# Patient Record
Sex: Male | Born: 1945 | Race: White | Hispanic: No | Marital: Married | Smoking: Never smoker
Health system: Southern US, Community
[De-identification: ages and names within clinical notes are randomized; demographics above are authoritative.]

## PROBLEM LIST (undated history)

## (undated) DIAGNOSIS — R319 Hematuria, unspecified: Secondary | ICD-10-CM

## (undated) DIAGNOSIS — E782 Mixed hyperlipidemia: Secondary | ICD-10-CM

## (undated) DIAGNOSIS — D649 Anemia, unspecified: Secondary | ICD-10-CM

## (undated) DIAGNOSIS — G8929 Other chronic pain: Secondary | ICD-10-CM

## (undated) DIAGNOSIS — Z9001 Acquired absence of eye: Secondary | ICD-10-CM

## (undated) DIAGNOSIS — M549 Dorsalgia, unspecified: Secondary | ICD-10-CM

## (undated) DIAGNOSIS — K219 Gastro-esophageal reflux disease without esophagitis: Secondary | ICD-10-CM

## (undated) DIAGNOSIS — N2889 Other specified disorders of kidney and ureter: Secondary | ICD-10-CM

## (undated) DIAGNOSIS — R399 Unspecified symptoms and signs involving the genitourinary system: Secondary | ICD-10-CM

## (undated) DIAGNOSIS — C801 Malignant (primary) neoplasm, unspecified: Secondary | ICD-10-CM

## (undated) DIAGNOSIS — E119 Type 2 diabetes mellitus without complications: Secondary | ICD-10-CM

## (undated) DIAGNOSIS — Z862 Personal history of diseases of the blood and blood-forming organs and certain disorders involving the immune mechanism: Secondary | ICD-10-CM

## (undated) DIAGNOSIS — I10 Essential (primary) hypertension: Secondary | ICD-10-CM

## (undated) DIAGNOSIS — Z973 Presence of spectacles and contact lenses: Secondary | ICD-10-CM

## (undated) HISTORY — PX: OTHER SURGICAL HISTORY: SHX169

## (undated) HISTORY — PX: WISDOM TOOTH EXTRACTION: SHX21

---

## 1979-09-16 HISTORY — PX: ENUCLEATION: SHX628

## 2011-11-12 DIAGNOSIS — E119 Type 2 diabetes mellitus without complications: Secondary | ICD-10-CM | POA: Diagnosis not present

## 2011-11-12 DIAGNOSIS — I10 Essential (primary) hypertension: Secondary | ICD-10-CM | POA: Diagnosis not present

## 2011-11-12 DIAGNOSIS — E782 Mixed hyperlipidemia: Secondary | ICD-10-CM | POA: Diagnosis not present

## 2011-11-19 DIAGNOSIS — N400 Enlarged prostate without lower urinary tract symptoms: Secondary | ICD-10-CM | POA: Diagnosis not present

## 2011-11-19 DIAGNOSIS — J309 Allergic rhinitis, unspecified: Secondary | ICD-10-CM | POA: Diagnosis not present

## 2011-11-19 DIAGNOSIS — E782 Mixed hyperlipidemia: Secondary | ICD-10-CM | POA: Diagnosis not present

## 2011-11-19 DIAGNOSIS — L2089 Other atopic dermatitis: Secondary | ICD-10-CM | POA: Diagnosis not present

## 2011-11-19 DIAGNOSIS — I10 Essential (primary) hypertension: Secondary | ICD-10-CM | POA: Diagnosis not present

## 2011-11-19 DIAGNOSIS — F329 Major depressive disorder, single episode, unspecified: Secondary | ICD-10-CM | POA: Diagnosis not present

## 2011-11-19 DIAGNOSIS — K219 Gastro-esophageal reflux disease without esophagitis: Secondary | ICD-10-CM | POA: Diagnosis not present

## 2012-01-05 DIAGNOSIS — K5730 Diverticulosis of large intestine without perforation or abscess without bleeding: Secondary | ICD-10-CM | POA: Diagnosis not present

## 2012-01-05 DIAGNOSIS — E782 Mixed hyperlipidemia: Secondary | ICD-10-CM | POA: Diagnosis not present

## 2012-01-05 DIAGNOSIS — K219 Gastro-esophageal reflux disease without esophagitis: Secondary | ICD-10-CM | POA: Diagnosis not present

## 2012-01-05 DIAGNOSIS — E119 Type 2 diabetes mellitus without complications: Secondary | ICD-10-CM | POA: Diagnosis not present

## 2012-01-05 DIAGNOSIS — Q438 Other specified congenital malformations of intestine: Secondary | ICD-10-CM | POA: Diagnosis not present

## 2012-01-05 DIAGNOSIS — D126 Benign neoplasm of colon, unspecified: Secondary | ICD-10-CM | POA: Diagnosis not present

## 2012-01-05 DIAGNOSIS — Z79899 Other long term (current) drug therapy: Secondary | ICD-10-CM | POA: Diagnosis not present

## 2012-01-05 DIAGNOSIS — Z1211 Encounter for screening for malignant neoplasm of colon: Secondary | ICD-10-CM | POA: Diagnosis not present

## 2012-01-05 DIAGNOSIS — I10 Essential (primary) hypertension: Secondary | ICD-10-CM | POA: Diagnosis not present

## 2012-01-05 DIAGNOSIS — F329 Major depressive disorder, single episode, unspecified: Secondary | ICD-10-CM | POA: Diagnosis not present

## 2012-01-20 DIAGNOSIS — D126 Benign neoplasm of colon, unspecified: Secondary | ICD-10-CM | POA: Diagnosis not present

## 2012-06-01 DIAGNOSIS — N400 Enlarged prostate without lower urinary tract symptoms: Secondary | ICD-10-CM | POA: Diagnosis not present

## 2012-06-01 DIAGNOSIS — R7309 Other abnormal glucose: Secondary | ICD-10-CM | POA: Diagnosis not present

## 2012-06-01 DIAGNOSIS — K219 Gastro-esophageal reflux disease without esophagitis: Secondary | ICD-10-CM | POA: Diagnosis not present

## 2012-06-01 DIAGNOSIS — E782 Mixed hyperlipidemia: Secondary | ICD-10-CM | POA: Diagnosis not present

## 2012-06-01 DIAGNOSIS — I10 Essential (primary) hypertension: Secondary | ICD-10-CM | POA: Diagnosis not present

## 2012-06-08 DIAGNOSIS — E119 Type 2 diabetes mellitus without complications: Secondary | ICD-10-CM | POA: Diagnosis not present

## 2012-06-08 DIAGNOSIS — E782 Mixed hyperlipidemia: Secondary | ICD-10-CM | POA: Diagnosis not present

## 2012-06-08 DIAGNOSIS — I10 Essential (primary) hypertension: Secondary | ICD-10-CM | POA: Diagnosis not present

## 2012-06-08 DIAGNOSIS — K219 Gastro-esophageal reflux disease without esophagitis: Secondary | ICD-10-CM | POA: Diagnosis not present

## 2012-06-08 DIAGNOSIS — J309 Allergic rhinitis, unspecified: Secondary | ICD-10-CM | POA: Diagnosis not present

## 2012-06-08 DIAGNOSIS — Z0000 Encounter for general adult medical examination without abnormal findings: Secondary | ICD-10-CM | POA: Diagnosis not present

## 2012-06-08 DIAGNOSIS — Z23 Encounter for immunization: Secondary | ICD-10-CM | POA: Diagnosis not present

## 2012-10-07 DIAGNOSIS — J209 Acute bronchitis, unspecified: Secondary | ICD-10-CM | POA: Diagnosis not present

## 2012-10-07 DIAGNOSIS — J019 Acute sinusitis, unspecified: Secondary | ICD-10-CM | POA: Diagnosis not present

## 2012-11-29 DIAGNOSIS — E782 Mixed hyperlipidemia: Secondary | ICD-10-CM | POA: Diagnosis not present

## 2012-12-06 DIAGNOSIS — K219 Gastro-esophageal reflux disease without esophagitis: Secondary | ICD-10-CM | POA: Diagnosis not present

## 2012-12-06 DIAGNOSIS — F329 Major depressive disorder, single episode, unspecified: Secondary | ICD-10-CM | POA: Diagnosis not present

## 2012-12-06 DIAGNOSIS — E782 Mixed hyperlipidemia: Secondary | ICD-10-CM | POA: Diagnosis not present

## 2012-12-06 DIAGNOSIS — J309 Allergic rhinitis, unspecified: Secondary | ICD-10-CM | POA: Diagnosis not present

## 2012-12-06 DIAGNOSIS — I10 Essential (primary) hypertension: Secondary | ICD-10-CM | POA: Diagnosis not present

## 2012-12-06 DIAGNOSIS — E119 Type 2 diabetes mellitus without complications: Secondary | ICD-10-CM | POA: Diagnosis not present

## 2013-06-06 DIAGNOSIS — K219 Gastro-esophageal reflux disease without esophagitis: Secondary | ICD-10-CM | POA: Diagnosis not present

## 2013-06-06 DIAGNOSIS — I10 Essential (primary) hypertension: Secondary | ICD-10-CM | POA: Diagnosis not present

## 2013-06-06 DIAGNOSIS — E119 Type 2 diabetes mellitus without complications: Secondary | ICD-10-CM | POA: Diagnosis not present

## 2013-06-06 DIAGNOSIS — E782 Mixed hyperlipidemia: Secondary | ICD-10-CM | POA: Diagnosis not present

## 2013-06-13 DIAGNOSIS — Z0000 Encounter for general adult medical examination without abnormal findings: Secondary | ICD-10-CM | POA: Diagnosis not present

## 2013-09-20 DIAGNOSIS — Z23 Encounter for immunization: Secondary | ICD-10-CM | POA: Diagnosis not present

## 2013-11-15 DIAGNOSIS — E119 Type 2 diabetes mellitus without complications: Secondary | ICD-10-CM | POA: Diagnosis not present

## 2013-11-15 DIAGNOSIS — F3289 Other specified depressive episodes: Secondary | ICD-10-CM | POA: Diagnosis not present

## 2013-11-15 DIAGNOSIS — N400 Enlarged prostate without lower urinary tract symptoms: Secondary | ICD-10-CM | POA: Diagnosis not present

## 2013-11-15 DIAGNOSIS — K219 Gastro-esophageal reflux disease without esophagitis: Secondary | ICD-10-CM | POA: Diagnosis not present

## 2013-11-15 DIAGNOSIS — F329 Major depressive disorder, single episode, unspecified: Secondary | ICD-10-CM | POA: Diagnosis not present

## 2013-11-15 DIAGNOSIS — E782 Mixed hyperlipidemia: Secondary | ICD-10-CM | POA: Diagnosis not present

## 2013-11-15 DIAGNOSIS — I10 Essential (primary) hypertension: Secondary | ICD-10-CM | POA: Diagnosis not present

## 2013-12-06 DIAGNOSIS — F329 Major depressive disorder, single episode, unspecified: Secondary | ICD-10-CM | POA: Diagnosis not present

## 2013-12-06 DIAGNOSIS — E782 Mixed hyperlipidemia: Secondary | ICD-10-CM | POA: Diagnosis not present

## 2013-12-06 DIAGNOSIS — K219 Gastro-esophageal reflux disease without esophagitis: Secondary | ICD-10-CM | POA: Diagnosis not present

## 2013-12-06 DIAGNOSIS — I10 Essential (primary) hypertension: Secondary | ICD-10-CM | POA: Diagnosis not present

## 2013-12-06 DIAGNOSIS — F3289 Other specified depressive episodes: Secondary | ICD-10-CM | POA: Diagnosis not present

## 2013-12-06 DIAGNOSIS — J309 Allergic rhinitis, unspecified: Secondary | ICD-10-CM | POA: Diagnosis not present

## 2013-12-06 DIAGNOSIS — K649 Unspecified hemorrhoids: Secondary | ICD-10-CM | POA: Diagnosis not present

## 2013-12-06 DIAGNOSIS — E119 Type 2 diabetes mellitus without complications: Secondary | ICD-10-CM | POA: Diagnosis not present

## 2014-01-06 DIAGNOSIS — E538 Deficiency of other specified B group vitamins: Secondary | ICD-10-CM | POA: Diagnosis not present

## 2014-01-06 DIAGNOSIS — E782 Mixed hyperlipidemia: Secondary | ICD-10-CM | POA: Diagnosis not present

## 2014-01-06 DIAGNOSIS — E119 Type 2 diabetes mellitus without complications: Secondary | ICD-10-CM | POA: Diagnosis not present

## 2014-01-06 DIAGNOSIS — I10 Essential (primary) hypertension: Secondary | ICD-10-CM | POA: Diagnosis not present

## 2014-03-09 DIAGNOSIS — F329 Major depressive disorder, single episode, unspecified: Secondary | ICD-10-CM | POA: Diagnosis not present

## 2014-03-09 DIAGNOSIS — E782 Mixed hyperlipidemia: Secondary | ICD-10-CM | POA: Diagnosis not present

## 2014-03-09 DIAGNOSIS — I10 Essential (primary) hypertension: Secondary | ICD-10-CM | POA: Diagnosis not present

## 2014-03-09 DIAGNOSIS — K219 Gastro-esophageal reflux disease without esophagitis: Secondary | ICD-10-CM | POA: Diagnosis not present

## 2014-03-09 DIAGNOSIS — F3289 Other specified depressive episodes: Secondary | ICD-10-CM | POA: Diagnosis not present

## 2014-03-09 DIAGNOSIS — E119 Type 2 diabetes mellitus without complications: Secondary | ICD-10-CM | POA: Diagnosis not present

## 2014-03-09 DIAGNOSIS — N400 Enlarged prostate without lower urinary tract symptoms: Secondary | ICD-10-CM | POA: Diagnosis not present

## 2014-03-16 DIAGNOSIS — I10 Essential (primary) hypertension: Secondary | ICD-10-CM | POA: Diagnosis not present

## 2014-03-16 DIAGNOSIS — E119 Type 2 diabetes mellitus without complications: Secondary | ICD-10-CM | POA: Diagnosis not present

## 2014-03-16 DIAGNOSIS — K649 Unspecified hemorrhoids: Secondary | ICD-10-CM | POA: Diagnosis not present

## 2014-03-16 DIAGNOSIS — E782 Mixed hyperlipidemia: Secondary | ICD-10-CM | POA: Diagnosis not present

## 2014-03-16 DIAGNOSIS — F329 Major depressive disorder, single episode, unspecified: Secondary | ICD-10-CM | POA: Diagnosis not present

## 2014-03-16 DIAGNOSIS — J329 Chronic sinusitis, unspecified: Secondary | ICD-10-CM | POA: Diagnosis not present

## 2014-03-16 DIAGNOSIS — K219 Gastro-esophageal reflux disease without esophagitis: Secondary | ICD-10-CM | POA: Diagnosis not present

## 2014-03-16 DIAGNOSIS — F3289 Other specified depressive episodes: Secondary | ICD-10-CM | POA: Diagnosis not present

## 2014-03-16 DIAGNOSIS — J309 Allergic rhinitis, unspecified: Secondary | ICD-10-CM | POA: Diagnosis not present

## 2014-03-24 DIAGNOSIS — H10409 Unspecified chronic conjunctivitis, unspecified eye: Secondary | ICD-10-CM | POA: Diagnosis not present

## 2014-04-13 DIAGNOSIS — J019 Acute sinusitis, unspecified: Secondary | ICD-10-CM | POA: Diagnosis not present

## 2014-06-09 DIAGNOSIS — H5210 Myopia, unspecified eye: Secondary | ICD-10-CM | POA: Diagnosis not present

## 2014-06-09 DIAGNOSIS — H11129 Conjunctival concretions, unspecified eye: Secondary | ICD-10-CM | POA: Diagnosis not present

## 2014-06-09 DIAGNOSIS — H2510 Age-related nuclear cataract, unspecified eye: Secondary | ICD-10-CM | POA: Diagnosis not present

## 2014-06-09 DIAGNOSIS — H52229 Regular astigmatism, unspecified eye: Secondary | ICD-10-CM | POA: Diagnosis not present

## 2014-09-21 DIAGNOSIS — K219 Gastro-esophageal reflux disease without esophagitis: Secondary | ICD-10-CM | POA: Diagnosis not present

## 2014-09-21 DIAGNOSIS — E782 Mixed hyperlipidemia: Secondary | ICD-10-CM | POA: Diagnosis not present

## 2014-09-21 DIAGNOSIS — I10 Essential (primary) hypertension: Secondary | ICD-10-CM | POA: Diagnosis not present

## 2014-09-21 DIAGNOSIS — E119 Type 2 diabetes mellitus without complications: Secondary | ICD-10-CM | POA: Diagnosis not present

## 2014-09-28 DIAGNOSIS — K219 Gastro-esophageal reflux disease without esophagitis: Secondary | ICD-10-CM | POA: Diagnosis not present

## 2014-09-28 DIAGNOSIS — E782 Mixed hyperlipidemia: Secondary | ICD-10-CM | POA: Diagnosis not present

## 2014-09-28 DIAGNOSIS — N400 Enlarged prostate without lower urinary tract symptoms: Secondary | ICD-10-CM | POA: Diagnosis not present

## 2014-09-28 DIAGNOSIS — Z0000 Encounter for general adult medical examination without abnormal findings: Secondary | ICD-10-CM | POA: Diagnosis not present

## 2014-09-28 DIAGNOSIS — E119 Type 2 diabetes mellitus without complications: Secondary | ICD-10-CM | POA: Diagnosis not present

## 2014-09-28 DIAGNOSIS — J3089 Other allergic rhinitis: Secondary | ICD-10-CM | POA: Diagnosis not present

## 2014-09-28 DIAGNOSIS — J329 Chronic sinusitis, unspecified: Secondary | ICD-10-CM | POA: Diagnosis not present

## 2014-09-28 DIAGNOSIS — I10 Essential (primary) hypertension: Secondary | ICD-10-CM | POA: Diagnosis not present

## 2014-09-28 DIAGNOSIS — Z1389 Encounter for screening for other disorder: Secondary | ICD-10-CM | POA: Diagnosis not present

## 2014-09-28 DIAGNOSIS — Z23 Encounter for immunization: Secondary | ICD-10-CM | POA: Diagnosis not present

## 2014-09-28 DIAGNOSIS — F328 Other depressive episodes: Secondary | ICD-10-CM | POA: Diagnosis not present

## 2014-10-20 DIAGNOSIS — H40049 Steroid responder, unspecified eye: Secondary | ICD-10-CM | POA: Diagnosis not present

## 2014-10-20 DIAGNOSIS — H2512 Age-related nuclear cataract, left eye: Secondary | ICD-10-CM | POA: Diagnosis not present

## 2014-10-20 DIAGNOSIS — H01009 Unspecified blepharitis unspecified eye, unspecified eyelid: Secondary | ICD-10-CM | POA: Diagnosis not present

## 2014-10-20 DIAGNOSIS — E119 Type 2 diabetes mellitus without complications: Secondary | ICD-10-CM | POA: Diagnosis not present

## 2014-11-01 DIAGNOSIS — J3089 Other allergic rhinitis: Secondary | ICD-10-CM | POA: Diagnosis not present

## 2014-11-01 DIAGNOSIS — J329 Chronic sinusitis, unspecified: Secondary | ICD-10-CM | POA: Diagnosis not present

## 2015-03-21 DIAGNOSIS — E119 Type 2 diabetes mellitus without complications: Secondary | ICD-10-CM | POA: Diagnosis not present

## 2015-03-21 DIAGNOSIS — I10 Essential (primary) hypertension: Secondary | ICD-10-CM | POA: Diagnosis not present

## 2015-03-21 DIAGNOSIS — K219 Gastro-esophageal reflux disease without esophagitis: Secondary | ICD-10-CM | POA: Diagnosis not present

## 2015-03-21 DIAGNOSIS — E782 Mixed hyperlipidemia: Secondary | ICD-10-CM | POA: Diagnosis not present

## 2015-03-28 DIAGNOSIS — K219 Gastro-esophageal reflux disease without esophagitis: Secondary | ICD-10-CM | POA: Diagnosis not present

## 2015-03-28 DIAGNOSIS — E755 Other lipid storage disorders: Secondary | ICD-10-CM | POA: Diagnosis not present

## 2015-03-28 DIAGNOSIS — F0631 Mood disorder due to known physiological condition with depressive features: Secondary | ICD-10-CM | POA: Diagnosis not present

## 2015-03-28 DIAGNOSIS — J300 Vasomotor rhinitis: Secondary | ICD-10-CM | POA: Diagnosis not present

## 2015-03-28 DIAGNOSIS — I10 Essential (primary) hypertension: Secondary | ICD-10-CM | POA: Diagnosis not present

## 2015-03-28 DIAGNOSIS — F328 Other depressive episodes: Secondary | ICD-10-CM | POA: Diagnosis not present

## 2015-03-28 DIAGNOSIS — K640 First degree hemorrhoids: Secondary | ICD-10-CM | POA: Diagnosis not present

## 2015-03-28 DIAGNOSIS — N400 Enlarged prostate without lower urinary tract symptoms: Secondary | ICD-10-CM | POA: Diagnosis not present

## 2015-03-28 DIAGNOSIS — Z0000 Encounter for general adult medical examination without abnormal findings: Secondary | ICD-10-CM | POA: Diagnosis not present

## 2015-03-28 DIAGNOSIS — J3089 Other allergic rhinitis: Secondary | ICD-10-CM | POA: Diagnosis not present

## 2015-03-28 DIAGNOSIS — E119 Type 2 diabetes mellitus without complications: Secondary | ICD-10-CM | POA: Diagnosis not present

## 2015-03-28 DIAGNOSIS — E782 Mixed hyperlipidemia: Secondary | ICD-10-CM | POA: Diagnosis not present

## 2015-03-28 DIAGNOSIS — J329 Chronic sinusitis, unspecified: Secondary | ICD-10-CM | POA: Diagnosis not present

## 2015-05-29 DIAGNOSIS — I10 Essential (primary) hypertension: Secondary | ICD-10-CM | POA: Diagnosis not present

## 2015-05-29 DIAGNOSIS — J018 Other acute sinusitis: Secondary | ICD-10-CM | POA: Diagnosis not present

## 2015-06-28 DIAGNOSIS — H2512 Age-related nuclear cataract, left eye: Secondary | ICD-10-CM | POA: Diagnosis not present

## 2015-06-28 DIAGNOSIS — H43812 Vitreous degeneration, left eye: Secondary | ICD-10-CM | POA: Diagnosis not present

## 2015-06-28 DIAGNOSIS — H11122 Conjunctival concretions, left eye: Secondary | ICD-10-CM | POA: Diagnosis not present

## 2015-06-28 DIAGNOSIS — H01004 Unspecified blepharitis left upper eyelid: Secondary | ICD-10-CM | POA: Diagnosis not present

## 2015-07-31 DIAGNOSIS — H01004 Unspecified blepharitis left upper eyelid: Secondary | ICD-10-CM | POA: Diagnosis not present

## 2015-07-31 DIAGNOSIS — H01001 Unspecified blepharitis right upper eyelid: Secondary | ICD-10-CM | POA: Diagnosis not present

## 2015-07-31 DIAGNOSIS — Z970 Presence of artificial eye: Secondary | ICD-10-CM | POA: Diagnosis not present

## 2015-07-31 DIAGNOSIS — H2512 Age-related nuclear cataract, left eye: Secondary | ICD-10-CM | POA: Diagnosis not present

## 2015-09-27 DIAGNOSIS — I10 Essential (primary) hypertension: Secondary | ICD-10-CM | POA: Diagnosis not present

## 2015-09-27 DIAGNOSIS — E782 Mixed hyperlipidemia: Secondary | ICD-10-CM | POA: Diagnosis not present

## 2015-09-27 DIAGNOSIS — K219 Gastro-esophageal reflux disease without esophagitis: Secondary | ICD-10-CM | POA: Diagnosis not present

## 2015-09-27 DIAGNOSIS — E119 Type 2 diabetes mellitus without complications: Secondary | ICD-10-CM | POA: Diagnosis not present

## 2015-10-18 DIAGNOSIS — I10 Essential (primary) hypertension: Secondary | ICD-10-CM | POA: Diagnosis not present

## 2015-10-18 DIAGNOSIS — E782 Mixed hyperlipidemia: Secondary | ICD-10-CM | POA: Diagnosis not present

## 2015-10-18 DIAGNOSIS — Z23 Encounter for immunization: Secondary | ICD-10-CM | POA: Diagnosis not present

## 2015-10-18 DIAGNOSIS — Z0000 Encounter for general adult medical examination without abnormal findings: Secondary | ICD-10-CM | POA: Diagnosis not present

## 2015-10-18 DIAGNOSIS — E119 Type 2 diabetes mellitus without complications: Secondary | ICD-10-CM | POA: Diagnosis not present

## 2015-10-18 DIAGNOSIS — K219 Gastro-esophageal reflux disease without esophagitis: Secondary | ICD-10-CM | POA: Diagnosis not present

## 2015-10-18 DIAGNOSIS — N400 Enlarged prostate without lower urinary tract symptoms: Secondary | ICD-10-CM | POA: Diagnosis not present

## 2015-10-18 DIAGNOSIS — J3089 Other allergic rhinitis: Secondary | ICD-10-CM | POA: Diagnosis not present

## 2016-03-19 DIAGNOSIS — H01001 Unspecified blepharitis right upper eyelid: Secondary | ICD-10-CM | POA: Diagnosis not present

## 2016-03-19 DIAGNOSIS — H2512 Age-related nuclear cataract, left eye: Secondary | ICD-10-CM | POA: Diagnosis not present

## 2016-03-19 DIAGNOSIS — H01004 Unspecified blepharitis left upper eyelid: Secondary | ICD-10-CM | POA: Diagnosis not present

## 2016-03-19 DIAGNOSIS — Z970 Presence of artificial eye: Secondary | ICD-10-CM | POA: Diagnosis not present

## 2016-04-11 DIAGNOSIS — E755 Other lipid storage disorders: Secondary | ICD-10-CM | POA: Diagnosis not present

## 2016-04-11 DIAGNOSIS — I10 Essential (primary) hypertension: Secondary | ICD-10-CM | POA: Diagnosis not present

## 2016-04-11 DIAGNOSIS — K219 Gastro-esophageal reflux disease without esophagitis: Secondary | ICD-10-CM | POA: Diagnosis not present

## 2016-04-11 DIAGNOSIS — E119 Type 2 diabetes mellitus without complications: Secondary | ICD-10-CM | POA: Diagnosis not present

## 2016-04-15 DIAGNOSIS — E782 Mixed hyperlipidemia: Secondary | ICD-10-CM | POA: Diagnosis not present

## 2016-04-15 DIAGNOSIS — E119 Type 2 diabetes mellitus without complications: Secondary | ICD-10-CM | POA: Diagnosis not present

## 2016-04-15 DIAGNOSIS — K219 Gastro-esophageal reflux disease without esophagitis: Secondary | ICD-10-CM | POA: Diagnosis not present

## 2016-04-15 DIAGNOSIS — I10 Essential (primary) hypertension: Secondary | ICD-10-CM | POA: Diagnosis not present

## 2016-04-15 DIAGNOSIS — J3089 Other allergic rhinitis: Secondary | ICD-10-CM | POA: Diagnosis not present

## 2016-04-15 DIAGNOSIS — Z6827 Body mass index (BMI) 27.0-27.9, adult: Secondary | ICD-10-CM | POA: Diagnosis not present

## 2016-04-15 DIAGNOSIS — J329 Chronic sinusitis, unspecified: Secondary | ICD-10-CM | POA: Diagnosis not present

## 2016-07-01 DIAGNOSIS — Z7984 Long term (current) use of oral hypoglycemic drugs: Secondary | ICD-10-CM | POA: Diagnosis not present

## 2016-07-01 DIAGNOSIS — E119 Type 2 diabetes mellitus without complications: Secondary | ICD-10-CM | POA: Diagnosis not present

## 2016-07-01 DIAGNOSIS — H43812 Vitreous degeneration, left eye: Secondary | ICD-10-CM | POA: Diagnosis not present

## 2016-07-01 DIAGNOSIS — H2512 Age-related nuclear cataract, left eye: Secondary | ICD-10-CM | POA: Diagnosis not present

## 2016-10-20 DIAGNOSIS — K219 Gastro-esophageal reflux disease without esophagitis: Secondary | ICD-10-CM | POA: Diagnosis not present

## 2016-10-20 DIAGNOSIS — E119 Type 2 diabetes mellitus without complications: Secondary | ICD-10-CM | POA: Diagnosis not present

## 2016-10-20 DIAGNOSIS — I10 Essential (primary) hypertension: Secondary | ICD-10-CM | POA: Diagnosis not present

## 2016-10-20 DIAGNOSIS — E782 Mixed hyperlipidemia: Secondary | ICD-10-CM | POA: Diagnosis not present

## 2016-10-22 DIAGNOSIS — J018 Other acute sinusitis: Secondary | ICD-10-CM | POA: Diagnosis not present

## 2016-10-22 DIAGNOSIS — E782 Mixed hyperlipidemia: Secondary | ICD-10-CM | POA: Diagnosis not present

## 2016-10-22 DIAGNOSIS — Z0000 Encounter for general adult medical examination without abnormal findings: Secondary | ICD-10-CM | POA: Diagnosis not present

## 2016-10-22 DIAGNOSIS — I10 Essential (primary) hypertension: Secondary | ICD-10-CM | POA: Diagnosis not present

## 2016-10-22 DIAGNOSIS — E119 Type 2 diabetes mellitus without complications: Secondary | ICD-10-CM | POA: Diagnosis not present

## 2016-10-22 DIAGNOSIS — J329 Chronic sinusitis, unspecified: Secondary | ICD-10-CM | POA: Diagnosis not present

## 2016-10-22 DIAGNOSIS — F330 Major depressive disorder, recurrent, mild: Secondary | ICD-10-CM | POA: Diagnosis not present

## 2016-10-22 DIAGNOSIS — K219 Gastro-esophageal reflux disease without esophagitis: Secondary | ICD-10-CM | POA: Diagnosis not present

## 2016-10-28 DIAGNOSIS — J018 Other acute sinusitis: Secondary | ICD-10-CM | POA: Diagnosis not present

## 2016-10-28 DIAGNOSIS — E119 Type 2 diabetes mellitus without complications: Secondary | ICD-10-CM | POA: Diagnosis not present

## 2016-10-28 DIAGNOSIS — Z6828 Body mass index (BMI) 28.0-28.9, adult: Secondary | ICD-10-CM | POA: Diagnosis not present

## 2017-01-01 DIAGNOSIS — E785 Hyperlipidemia, unspecified: Secondary | ICD-10-CM | POA: Diagnosis not present

## 2017-01-01 DIAGNOSIS — K219 Gastro-esophageal reflux disease without esophagitis: Secondary | ICD-10-CM | POA: Diagnosis not present

## 2017-01-01 DIAGNOSIS — Z86010 Personal history of colonic polyps: Secondary | ICD-10-CM | POA: Diagnosis not present

## 2017-01-01 DIAGNOSIS — F329 Major depressive disorder, single episode, unspecified: Secondary | ICD-10-CM | POA: Diagnosis not present

## 2017-01-01 DIAGNOSIS — D120 Benign neoplasm of cecum: Secondary | ICD-10-CM | POA: Diagnosis not present

## 2017-01-01 DIAGNOSIS — D122 Benign neoplasm of ascending colon: Secondary | ICD-10-CM | POA: Diagnosis not present

## 2017-01-01 DIAGNOSIS — Q438 Other specified congenital malformations of intestine: Secondary | ICD-10-CM | POA: Diagnosis not present

## 2017-01-01 DIAGNOSIS — K5730 Diverticulosis of large intestine without perforation or abscess without bleeding: Secondary | ICD-10-CM | POA: Diagnosis not present

## 2017-01-01 DIAGNOSIS — Z1211 Encounter for screening for malignant neoplasm of colon: Secondary | ICD-10-CM | POA: Diagnosis not present

## 2017-01-01 DIAGNOSIS — D649 Anemia, unspecified: Secondary | ICD-10-CM | POA: Diagnosis not present

## 2017-01-01 DIAGNOSIS — K635 Polyp of colon: Secondary | ICD-10-CM | POA: Diagnosis not present

## 2017-01-01 DIAGNOSIS — E7800 Pure hypercholesterolemia, unspecified: Secondary | ICD-10-CM | POA: Diagnosis not present

## 2017-01-01 DIAGNOSIS — E119 Type 2 diabetes mellitus without complications: Secondary | ICD-10-CM | POA: Diagnosis not present

## 2017-01-01 DIAGNOSIS — Z886 Allergy status to analgesic agent status: Secondary | ICD-10-CM | POA: Diagnosis not present

## 2017-01-01 DIAGNOSIS — I10 Essential (primary) hypertension: Secondary | ICD-10-CM | POA: Diagnosis not present

## 2017-01-01 DIAGNOSIS — D126 Benign neoplasm of colon, unspecified: Secondary | ICD-10-CM | POA: Diagnosis not present

## 2017-04-24 DIAGNOSIS — D519 Vitamin B12 deficiency anemia, unspecified: Secondary | ICD-10-CM | POA: Diagnosis not present

## 2017-04-24 DIAGNOSIS — E119 Type 2 diabetes mellitus without complications: Secondary | ICD-10-CM | POA: Diagnosis not present

## 2017-04-24 DIAGNOSIS — D509 Iron deficiency anemia, unspecified: Secondary | ICD-10-CM | POA: Diagnosis not present

## 2017-04-24 DIAGNOSIS — I10 Essential (primary) hypertension: Secondary | ICD-10-CM | POA: Diagnosis not present

## 2017-04-24 DIAGNOSIS — K219 Gastro-esophageal reflux disease without esophagitis: Secondary | ICD-10-CM | POA: Diagnosis not present

## 2017-04-24 DIAGNOSIS — E782 Mixed hyperlipidemia: Secondary | ICD-10-CM | POA: Diagnosis not present

## 2017-04-29 DIAGNOSIS — K219 Gastro-esophageal reflux disease without esophagitis: Secondary | ICD-10-CM | POA: Diagnosis not present

## 2017-04-29 DIAGNOSIS — E1165 Type 2 diabetes mellitus with hyperglycemia: Secondary | ICD-10-CM | POA: Diagnosis not present

## 2017-04-29 DIAGNOSIS — D126 Benign neoplasm of colon, unspecified: Secondary | ICD-10-CM | POA: Diagnosis not present

## 2017-04-29 DIAGNOSIS — D509 Iron deficiency anemia, unspecified: Secondary | ICD-10-CM | POA: Diagnosis not present

## 2017-04-29 DIAGNOSIS — J329 Chronic sinusitis, unspecified: Secondary | ICD-10-CM | POA: Diagnosis not present

## 2017-04-29 DIAGNOSIS — E782 Mixed hyperlipidemia: Secondary | ICD-10-CM | POA: Diagnosis not present

## 2017-04-29 DIAGNOSIS — Z6827 Body mass index (BMI) 27.0-27.9, adult: Secondary | ICD-10-CM | POA: Diagnosis not present

## 2017-04-29 DIAGNOSIS — I10 Essential (primary) hypertension: Secondary | ICD-10-CM | POA: Diagnosis not present

## 2017-06-18 DIAGNOSIS — D519 Vitamin B12 deficiency anemia, unspecified: Secondary | ICD-10-CM | POA: Diagnosis not present

## 2017-06-18 DIAGNOSIS — D509 Iron deficiency anemia, unspecified: Secondary | ICD-10-CM | POA: Diagnosis not present

## 2017-07-27 DIAGNOSIS — K219 Gastro-esophageal reflux disease without esophagitis: Secondary | ICD-10-CM | POA: Diagnosis not present

## 2017-07-27 DIAGNOSIS — E782 Mixed hyperlipidemia: Secondary | ICD-10-CM | POA: Diagnosis not present

## 2017-07-27 DIAGNOSIS — E755 Other lipid storage disorders: Secondary | ICD-10-CM | POA: Diagnosis not present

## 2017-07-27 DIAGNOSIS — D509 Iron deficiency anemia, unspecified: Secondary | ICD-10-CM | POA: Diagnosis not present

## 2017-07-27 DIAGNOSIS — I10 Essential (primary) hypertension: Secondary | ICD-10-CM | POA: Diagnosis not present

## 2017-07-27 DIAGNOSIS — E1165 Type 2 diabetes mellitus with hyperglycemia: Secondary | ICD-10-CM | POA: Diagnosis not present

## 2017-07-27 DIAGNOSIS — D519 Vitamin B12 deficiency anemia, unspecified: Secondary | ICD-10-CM | POA: Diagnosis not present

## 2017-08-05 DIAGNOSIS — M545 Low back pain: Secondary | ICD-10-CM | POA: Diagnosis not present

## 2017-08-05 DIAGNOSIS — J3089 Other allergic rhinitis: Secondary | ICD-10-CM | POA: Diagnosis not present

## 2017-08-05 DIAGNOSIS — E1165 Type 2 diabetes mellitus with hyperglycemia: Secondary | ICD-10-CM | POA: Diagnosis not present

## 2017-08-05 DIAGNOSIS — Z6827 Body mass index (BMI) 27.0-27.9, adult: Secondary | ICD-10-CM | POA: Diagnosis not present

## 2017-08-05 DIAGNOSIS — E782 Mixed hyperlipidemia: Secondary | ICD-10-CM | POA: Diagnosis not present

## 2017-08-05 DIAGNOSIS — I10 Essential (primary) hypertension: Secondary | ICD-10-CM | POA: Diagnosis not present

## 2017-08-05 DIAGNOSIS — D509 Iron deficiency anemia, unspecified: Secondary | ICD-10-CM | POA: Diagnosis not present

## 2017-12-28 DIAGNOSIS — M9903 Segmental and somatic dysfunction of lumbar region: Secondary | ICD-10-CM | POA: Diagnosis not present

## 2017-12-28 DIAGNOSIS — S336XXA Sprain of sacroiliac joint, initial encounter: Secondary | ICD-10-CM | POA: Diagnosis not present

## 2017-12-28 DIAGNOSIS — M47816 Spondylosis without myelopathy or radiculopathy, lumbar region: Secondary | ICD-10-CM | POA: Diagnosis not present

## 2017-12-29 DIAGNOSIS — M9903 Segmental and somatic dysfunction of lumbar region: Secondary | ICD-10-CM | POA: Diagnosis not present

## 2017-12-29 DIAGNOSIS — M47816 Spondylosis without myelopathy or radiculopathy, lumbar region: Secondary | ICD-10-CM | POA: Diagnosis not present

## 2017-12-29 DIAGNOSIS — Z681 Body mass index (BMI) 19 or less, adult: Secondary | ICD-10-CM | POA: Diagnosis not present

## 2017-12-29 DIAGNOSIS — S336XXA Sprain of sacroiliac joint, initial encounter: Secondary | ICD-10-CM | POA: Diagnosis not present

## 2017-12-29 DIAGNOSIS — N400 Enlarged prostate without lower urinary tract symptoms: Secondary | ICD-10-CM | POA: Diagnosis not present

## 2017-12-29 DIAGNOSIS — I10 Essential (primary) hypertension: Secondary | ICD-10-CM | POA: Diagnosis not present

## 2017-12-31 DIAGNOSIS — S336XXA Sprain of sacroiliac joint, initial encounter: Secondary | ICD-10-CM | POA: Diagnosis not present

## 2017-12-31 DIAGNOSIS — M9903 Segmental and somatic dysfunction of lumbar region: Secondary | ICD-10-CM | POA: Diagnosis not present

## 2017-12-31 DIAGNOSIS — M9901 Segmental and somatic dysfunction of cervical region: Secondary | ICD-10-CM | POA: Diagnosis not present

## 2017-12-31 DIAGNOSIS — S233XXA Sprain of ligaments of thoracic spine, initial encounter: Secondary | ICD-10-CM | POA: Diagnosis not present

## 2017-12-31 DIAGNOSIS — S134XXA Sprain of ligaments of cervical spine, initial encounter: Secondary | ICD-10-CM | POA: Diagnosis not present

## 2017-12-31 DIAGNOSIS — M47816 Spondylosis without myelopathy or radiculopathy, lumbar region: Secondary | ICD-10-CM | POA: Diagnosis not present

## 2017-12-31 DIAGNOSIS — M9902 Segmental and somatic dysfunction of thoracic region: Secondary | ICD-10-CM | POA: Diagnosis not present

## 2018-01-05 DIAGNOSIS — S233XXA Sprain of ligaments of thoracic spine, initial encounter: Secondary | ICD-10-CM | POA: Diagnosis not present

## 2018-01-05 DIAGNOSIS — S336XXA Sprain of sacroiliac joint, initial encounter: Secondary | ICD-10-CM | POA: Diagnosis not present

## 2018-01-05 DIAGNOSIS — M9903 Segmental and somatic dysfunction of lumbar region: Secondary | ICD-10-CM | POA: Diagnosis not present

## 2018-01-05 DIAGNOSIS — M9901 Segmental and somatic dysfunction of cervical region: Secondary | ICD-10-CM | POA: Diagnosis not present

## 2018-01-05 DIAGNOSIS — M9902 Segmental and somatic dysfunction of thoracic region: Secondary | ICD-10-CM | POA: Diagnosis not present

## 2018-01-05 DIAGNOSIS — S134XXA Sprain of ligaments of cervical spine, initial encounter: Secondary | ICD-10-CM | POA: Diagnosis not present

## 2018-01-05 DIAGNOSIS — M47816 Spondylosis without myelopathy or radiculopathy, lumbar region: Secondary | ICD-10-CM | POA: Diagnosis not present

## 2018-01-07 DIAGNOSIS — S233XXA Sprain of ligaments of thoracic spine, initial encounter: Secondary | ICD-10-CM | POA: Diagnosis not present

## 2018-01-07 DIAGNOSIS — M47816 Spondylosis without myelopathy or radiculopathy, lumbar region: Secondary | ICD-10-CM | POA: Diagnosis not present

## 2018-01-07 DIAGNOSIS — S336XXA Sprain of sacroiliac joint, initial encounter: Secondary | ICD-10-CM | POA: Diagnosis not present

## 2018-01-07 DIAGNOSIS — M9902 Segmental and somatic dysfunction of thoracic region: Secondary | ICD-10-CM | POA: Diagnosis not present

## 2018-01-07 DIAGNOSIS — M9901 Segmental and somatic dysfunction of cervical region: Secondary | ICD-10-CM | POA: Diagnosis not present

## 2018-01-07 DIAGNOSIS — M9903 Segmental and somatic dysfunction of lumbar region: Secondary | ICD-10-CM | POA: Diagnosis not present

## 2018-01-07 DIAGNOSIS — S134XXA Sprain of ligaments of cervical spine, initial encounter: Secondary | ICD-10-CM | POA: Diagnosis not present

## 2018-01-13 DIAGNOSIS — S336XXA Sprain of sacroiliac joint, initial encounter: Secondary | ICD-10-CM | POA: Diagnosis not present

## 2018-01-13 DIAGNOSIS — M9902 Segmental and somatic dysfunction of thoracic region: Secondary | ICD-10-CM | POA: Diagnosis not present

## 2018-01-13 DIAGNOSIS — M9901 Segmental and somatic dysfunction of cervical region: Secondary | ICD-10-CM | POA: Diagnosis not present

## 2018-01-13 DIAGNOSIS — S134XXA Sprain of ligaments of cervical spine, initial encounter: Secondary | ICD-10-CM | POA: Diagnosis not present

## 2018-01-13 DIAGNOSIS — M9903 Segmental and somatic dysfunction of lumbar region: Secondary | ICD-10-CM | POA: Diagnosis not present

## 2018-01-13 DIAGNOSIS — S233XXA Sprain of ligaments of thoracic spine, initial encounter: Secondary | ICD-10-CM | POA: Diagnosis not present

## 2018-01-13 DIAGNOSIS — M47816 Spondylosis without myelopathy or radiculopathy, lumbar region: Secondary | ICD-10-CM | POA: Diagnosis not present

## 2018-01-20 DIAGNOSIS — S134XXA Sprain of ligaments of cervical spine, initial encounter: Secondary | ICD-10-CM | POA: Diagnosis not present

## 2018-01-20 DIAGNOSIS — S336XXA Sprain of sacroiliac joint, initial encounter: Secondary | ICD-10-CM | POA: Diagnosis not present

## 2018-01-20 DIAGNOSIS — M9902 Segmental and somatic dysfunction of thoracic region: Secondary | ICD-10-CM | POA: Diagnosis not present

## 2018-01-20 DIAGNOSIS — M9901 Segmental and somatic dysfunction of cervical region: Secondary | ICD-10-CM | POA: Diagnosis not present

## 2018-01-20 DIAGNOSIS — M47816 Spondylosis without myelopathy or radiculopathy, lumbar region: Secondary | ICD-10-CM | POA: Diagnosis not present

## 2018-01-20 DIAGNOSIS — S233XXA Sprain of ligaments of thoracic spine, initial encounter: Secondary | ICD-10-CM | POA: Diagnosis not present

## 2018-01-20 DIAGNOSIS — M9903 Segmental and somatic dysfunction of lumbar region: Secondary | ICD-10-CM | POA: Diagnosis not present

## 2018-02-03 DIAGNOSIS — D529 Folate deficiency anemia, unspecified: Secondary | ICD-10-CM | POA: Diagnosis not present

## 2018-02-03 DIAGNOSIS — E1165 Type 2 diabetes mellitus with hyperglycemia: Secondary | ICD-10-CM | POA: Diagnosis not present

## 2018-02-03 DIAGNOSIS — K219 Gastro-esophageal reflux disease without esophagitis: Secondary | ICD-10-CM | POA: Diagnosis not present

## 2018-02-03 DIAGNOSIS — D519 Vitamin B12 deficiency anemia, unspecified: Secondary | ICD-10-CM | POA: Diagnosis not present

## 2018-02-03 DIAGNOSIS — D509 Iron deficiency anemia, unspecified: Secondary | ICD-10-CM | POA: Diagnosis not present

## 2018-02-03 DIAGNOSIS — E755 Other lipid storage disorders: Secondary | ICD-10-CM | POA: Diagnosis not present

## 2018-02-03 DIAGNOSIS — E782 Mixed hyperlipidemia: Secondary | ICD-10-CM | POA: Diagnosis not present

## 2018-02-03 DIAGNOSIS — I10 Essential (primary) hypertension: Secondary | ICD-10-CM | POA: Diagnosis not present

## 2018-02-09 DIAGNOSIS — M47816 Spondylosis without myelopathy or radiculopathy, lumbar region: Secondary | ICD-10-CM | POA: Diagnosis not present

## 2018-02-09 DIAGNOSIS — M9901 Segmental and somatic dysfunction of cervical region: Secondary | ICD-10-CM | POA: Diagnosis not present

## 2018-02-09 DIAGNOSIS — S233XXA Sprain of ligaments of thoracic spine, initial encounter: Secondary | ICD-10-CM | POA: Diagnosis not present

## 2018-02-09 DIAGNOSIS — S336XXA Sprain of sacroiliac joint, initial encounter: Secondary | ICD-10-CM | POA: Diagnosis not present

## 2018-02-09 DIAGNOSIS — M9903 Segmental and somatic dysfunction of lumbar region: Secondary | ICD-10-CM | POA: Diagnosis not present

## 2018-02-09 DIAGNOSIS — M9902 Segmental and somatic dysfunction of thoracic region: Secondary | ICD-10-CM | POA: Diagnosis not present

## 2018-02-09 DIAGNOSIS — S134XXA Sprain of ligaments of cervical spine, initial encounter: Secondary | ICD-10-CM | POA: Diagnosis not present

## 2018-02-10 DIAGNOSIS — K219 Gastro-esophageal reflux disease without esophagitis: Secondary | ICD-10-CM | POA: Diagnosis not present

## 2018-02-10 DIAGNOSIS — Z6826 Body mass index (BMI) 26.0-26.9, adult: Secondary | ICD-10-CM | POA: Diagnosis not present

## 2018-02-10 DIAGNOSIS — E782 Mixed hyperlipidemia: Secondary | ICD-10-CM | POA: Diagnosis not present

## 2018-02-10 DIAGNOSIS — E1165 Type 2 diabetes mellitus with hyperglycemia: Secondary | ICD-10-CM | POA: Diagnosis not present

## 2018-02-10 DIAGNOSIS — D509 Iron deficiency anemia, unspecified: Secondary | ICD-10-CM | POA: Diagnosis not present

## 2018-02-10 DIAGNOSIS — I10 Essential (primary) hypertension: Secondary | ICD-10-CM | POA: Diagnosis not present

## 2018-02-24 DIAGNOSIS — S233XXA Sprain of ligaments of thoracic spine, initial encounter: Secondary | ICD-10-CM | POA: Diagnosis not present

## 2018-02-24 DIAGNOSIS — M9902 Segmental and somatic dysfunction of thoracic region: Secondary | ICD-10-CM | POA: Diagnosis not present

## 2018-02-24 DIAGNOSIS — S134XXA Sprain of ligaments of cervical spine, initial encounter: Secondary | ICD-10-CM | POA: Diagnosis not present

## 2018-02-24 DIAGNOSIS — M9901 Segmental and somatic dysfunction of cervical region: Secondary | ICD-10-CM | POA: Diagnosis not present

## 2018-02-24 DIAGNOSIS — M9903 Segmental and somatic dysfunction of lumbar region: Secondary | ICD-10-CM | POA: Diagnosis not present

## 2018-02-24 DIAGNOSIS — S336XXA Sprain of sacroiliac joint, initial encounter: Secondary | ICD-10-CM | POA: Diagnosis not present

## 2018-02-24 DIAGNOSIS — M47816 Spondylosis without myelopathy or radiculopathy, lumbar region: Secondary | ICD-10-CM | POA: Diagnosis not present

## 2018-05-05 DIAGNOSIS — I10 Essential (primary) hypertension: Secondary | ICD-10-CM | POA: Diagnosis not present

## 2018-05-05 DIAGNOSIS — D509 Iron deficiency anemia, unspecified: Secondary | ICD-10-CM | POA: Diagnosis not present

## 2018-05-05 DIAGNOSIS — E1165 Type 2 diabetes mellitus with hyperglycemia: Secondary | ICD-10-CM | POA: Diagnosis not present

## 2018-05-05 DIAGNOSIS — Z23 Encounter for immunization: Secondary | ICD-10-CM | POA: Diagnosis not present

## 2018-05-05 DIAGNOSIS — D126 Benign neoplasm of colon, unspecified: Secondary | ICD-10-CM | POA: Diagnosis not present

## 2018-05-05 DIAGNOSIS — Z6826 Body mass index (BMI) 26.0-26.9, adult: Secondary | ICD-10-CM | POA: Diagnosis not present

## 2018-05-05 DIAGNOSIS — E782 Mixed hyperlipidemia: Secondary | ICD-10-CM | POA: Diagnosis not present

## 2018-05-05 DIAGNOSIS — K219 Gastro-esophageal reflux disease without esophagitis: Secondary | ICD-10-CM | POA: Diagnosis not present

## 2018-05-05 DIAGNOSIS — Z0001 Encounter for general adult medical examination with abnormal findings: Secondary | ICD-10-CM | POA: Diagnosis not present

## 2018-07-23 DIAGNOSIS — D509 Iron deficiency anemia, unspecified: Secondary | ICD-10-CM | POA: Diagnosis not present

## 2018-07-23 DIAGNOSIS — E119 Type 2 diabetes mellitus without complications: Secondary | ICD-10-CM | POA: Diagnosis not present

## 2018-07-23 DIAGNOSIS — E782 Mixed hyperlipidemia: Secondary | ICD-10-CM | POA: Diagnosis not present

## 2018-07-23 DIAGNOSIS — E1165 Type 2 diabetes mellitus with hyperglycemia: Secondary | ICD-10-CM | POA: Diagnosis not present

## 2018-07-23 DIAGNOSIS — K219 Gastro-esophageal reflux disease without esophagitis: Secondary | ICD-10-CM | POA: Diagnosis not present

## 2018-07-23 DIAGNOSIS — I10 Essential (primary) hypertension: Secondary | ICD-10-CM | POA: Diagnosis not present

## 2018-07-28 DIAGNOSIS — Z6826 Body mass index (BMI) 26.0-26.9, adult: Secondary | ICD-10-CM | POA: Diagnosis not present

## 2018-07-28 DIAGNOSIS — I10 Essential (primary) hypertension: Secondary | ICD-10-CM | POA: Diagnosis not present

## 2018-07-28 DIAGNOSIS — D126 Benign neoplasm of colon, unspecified: Secondary | ICD-10-CM | POA: Diagnosis not present

## 2018-07-28 DIAGNOSIS — D509 Iron deficiency anemia, unspecified: Secondary | ICD-10-CM | POA: Diagnosis not present

## 2018-07-28 DIAGNOSIS — E1165 Type 2 diabetes mellitus with hyperglycemia: Secondary | ICD-10-CM | POA: Diagnosis not present

## 2018-07-28 DIAGNOSIS — Z23 Encounter for immunization: Secondary | ICD-10-CM | POA: Diagnosis not present

## 2018-07-28 DIAGNOSIS — K219 Gastro-esophageal reflux disease without esophagitis: Secondary | ICD-10-CM | POA: Diagnosis not present

## 2018-07-28 DIAGNOSIS — E782 Mixed hyperlipidemia: Secondary | ICD-10-CM | POA: Diagnosis not present

## 2018-10-08 DIAGNOSIS — H25812 Combined forms of age-related cataract, left eye: Secondary | ICD-10-CM | POA: Diagnosis not present

## 2018-10-08 DIAGNOSIS — E119 Type 2 diabetes mellitus without complications: Secondary | ICD-10-CM | POA: Diagnosis not present

## 2018-10-08 DIAGNOSIS — Z970 Presence of artificial eye: Secondary | ICD-10-CM | POA: Diagnosis not present

## 2019-01-31 DIAGNOSIS — I10 Essential (primary) hypertension: Secondary | ICD-10-CM | POA: Diagnosis not present

## 2019-01-31 DIAGNOSIS — E782 Mixed hyperlipidemia: Secondary | ICD-10-CM | POA: Diagnosis not present

## 2019-01-31 DIAGNOSIS — E1165 Type 2 diabetes mellitus with hyperglycemia: Secondary | ICD-10-CM | POA: Diagnosis not present

## 2019-01-31 DIAGNOSIS — K219 Gastro-esophageal reflux disease without esophagitis: Secondary | ICD-10-CM | POA: Diagnosis not present

## 2019-01-31 DIAGNOSIS — D509 Iron deficiency anemia, unspecified: Secondary | ICD-10-CM | POA: Diagnosis not present

## 2019-01-31 DIAGNOSIS — D519 Vitamin B12 deficiency anemia, unspecified: Secondary | ICD-10-CM | POA: Diagnosis not present

## 2019-02-03 DIAGNOSIS — E782 Mixed hyperlipidemia: Secondary | ICD-10-CM | POA: Diagnosis not present

## 2019-02-03 DIAGNOSIS — D126 Benign neoplasm of colon, unspecified: Secondary | ICD-10-CM | POA: Diagnosis not present

## 2019-02-03 DIAGNOSIS — Z6826 Body mass index (BMI) 26.0-26.9, adult: Secondary | ICD-10-CM | POA: Diagnosis not present

## 2019-02-03 DIAGNOSIS — E1169 Type 2 diabetes mellitus with other specified complication: Secondary | ICD-10-CM | POA: Diagnosis not present

## 2019-02-03 DIAGNOSIS — N481 Balanitis: Secondary | ICD-10-CM | POA: Diagnosis not present

## 2019-02-03 DIAGNOSIS — D509 Iron deficiency anemia, unspecified: Secondary | ICD-10-CM | POA: Diagnosis not present

## 2019-02-03 DIAGNOSIS — I10 Essential (primary) hypertension: Secondary | ICD-10-CM | POA: Diagnosis not present

## 2019-02-03 DIAGNOSIS — Z970 Presence of artificial eye: Secondary | ICD-10-CM | POA: Diagnosis not present

## 2019-04-27 DIAGNOSIS — T1512XA Foreign body in conjunctival sac, left eye, initial encounter: Secondary | ICD-10-CM | POA: Diagnosis not present

## 2019-05-02 DIAGNOSIS — E1165 Type 2 diabetes mellitus with hyperglycemia: Secondary | ICD-10-CM | POA: Diagnosis not present

## 2019-05-05 DIAGNOSIS — E1169 Type 2 diabetes mellitus with other specified complication: Secondary | ICD-10-CM | POA: Diagnosis not present

## 2019-05-05 DIAGNOSIS — M545 Low back pain: Secondary | ICD-10-CM | POA: Diagnosis not present

## 2019-05-05 DIAGNOSIS — N471 Phimosis: Secondary | ICD-10-CM | POA: Diagnosis not present

## 2019-05-05 DIAGNOSIS — Z6825 Body mass index (BMI) 25.0-25.9, adult: Secondary | ICD-10-CM | POA: Diagnosis not present

## 2019-07-27 DIAGNOSIS — E1165 Type 2 diabetes mellitus with hyperglycemia: Secondary | ICD-10-CM | POA: Diagnosis not present

## 2019-07-27 DIAGNOSIS — I10 Essential (primary) hypertension: Secondary | ICD-10-CM | POA: Diagnosis not present

## 2019-07-27 DIAGNOSIS — E782 Mixed hyperlipidemia: Secondary | ICD-10-CM | POA: Diagnosis not present

## 2019-07-27 DIAGNOSIS — D509 Iron deficiency anemia, unspecified: Secondary | ICD-10-CM | POA: Diagnosis not present

## 2019-07-27 DIAGNOSIS — K219 Gastro-esophageal reflux disease without esophagitis: Secondary | ICD-10-CM | POA: Diagnosis not present

## 2019-07-27 DIAGNOSIS — E755 Other lipid storage disorders: Secondary | ICD-10-CM | POA: Diagnosis not present

## 2019-07-27 DIAGNOSIS — E1169 Type 2 diabetes mellitus with other specified complication: Secondary | ICD-10-CM | POA: Diagnosis not present

## 2019-08-02 DIAGNOSIS — Z6825 Body mass index (BMI) 25.0-25.9, adult: Secondary | ICD-10-CM | POA: Diagnosis not present

## 2019-08-02 DIAGNOSIS — E1169 Type 2 diabetes mellitus with other specified complication: Secondary | ICD-10-CM | POA: Diagnosis not present

## 2019-08-02 DIAGNOSIS — Z23 Encounter for immunization: Secondary | ICD-10-CM | POA: Diagnosis not present

## 2019-08-02 DIAGNOSIS — I10 Essential (primary) hypertension: Secondary | ICD-10-CM | POA: Diagnosis not present

## 2019-08-02 DIAGNOSIS — K219 Gastro-esophageal reflux disease without esophagitis: Secondary | ICD-10-CM | POA: Diagnosis not present

## 2019-08-02 DIAGNOSIS — D509 Iron deficiency anemia, unspecified: Secondary | ICD-10-CM | POA: Diagnosis not present

## 2019-08-10 ENCOUNTER — Other Ambulatory Visit: Payer: Self-pay

## 2019-10-27 DIAGNOSIS — I10 Essential (primary) hypertension: Secondary | ICD-10-CM | POA: Diagnosis not present

## 2019-10-27 DIAGNOSIS — K219 Gastro-esophageal reflux disease without esophagitis: Secondary | ICD-10-CM | POA: Diagnosis not present

## 2019-10-27 DIAGNOSIS — E1169 Type 2 diabetes mellitus with other specified complication: Secondary | ICD-10-CM | POA: Diagnosis not present

## 2019-10-27 DIAGNOSIS — E1165 Type 2 diabetes mellitus with hyperglycemia: Secondary | ICD-10-CM | POA: Diagnosis not present

## 2019-10-31 DIAGNOSIS — Z6824 Body mass index (BMI) 24.0-24.9, adult: Secondary | ICD-10-CM | POA: Diagnosis not present

## 2019-10-31 DIAGNOSIS — E782 Mixed hyperlipidemia: Secondary | ICD-10-CM | POA: Diagnosis not present

## 2019-10-31 DIAGNOSIS — Z0001 Encounter for general adult medical examination with abnormal findings: Secondary | ICD-10-CM | POA: Diagnosis not present

## 2019-10-31 DIAGNOSIS — N481 Balanitis: Secondary | ICD-10-CM | POA: Diagnosis not present

## 2019-10-31 DIAGNOSIS — N471 Phimosis: Secondary | ICD-10-CM | POA: Diagnosis not present

## 2019-10-31 DIAGNOSIS — D509 Iron deficiency anemia, unspecified: Secondary | ICD-10-CM | POA: Diagnosis not present

## 2019-10-31 DIAGNOSIS — E1169 Type 2 diabetes mellitus with other specified complication: Secondary | ICD-10-CM | POA: Diagnosis not present

## 2019-11-28 DIAGNOSIS — N471 Phimosis: Secondary | ICD-10-CM | POA: Diagnosis not present

## 2019-11-29 ENCOUNTER — Other Ambulatory Visit: Payer: Self-pay | Admitting: Urology

## 2019-12-01 ENCOUNTER — Encounter (HOSPITAL_BASED_OUTPATIENT_CLINIC_OR_DEPARTMENT_OTHER): Payer: Self-pay | Admitting: Urology

## 2019-12-06 ENCOUNTER — Other Ambulatory Visit (HOSPITAL_COMMUNITY)
Admission: RE | Admit: 2019-12-06 | Discharge: 2019-12-06 | Disposition: A | Discharge status: Home / Self Care | Payer: Medicare Other | Source: Ambulatory Visit | Attending: Urology | Admitting: Urology

## 2019-12-06 ENCOUNTER — Other Ambulatory Visit: Payer: Self-pay

## 2019-12-06 ENCOUNTER — Encounter (HOSPITAL_BASED_OUTPATIENT_CLINIC_OR_DEPARTMENT_OTHER): Payer: Self-pay | Admitting: Urology

## 2019-12-06 DIAGNOSIS — N471 Phimosis: Secondary | ICD-10-CM | POA: Diagnosis not present

## 2019-12-06 DIAGNOSIS — Z7984 Long term (current) use of oral hypoglycemic drugs: Secondary | ICD-10-CM | POA: Diagnosis not present

## 2019-12-06 DIAGNOSIS — Z20822 Contact with and (suspected) exposure to covid-19: Secondary | ICD-10-CM | POA: Diagnosis not present

## 2019-12-06 DIAGNOSIS — E119 Type 2 diabetes mellitus without complications: Secondary | ICD-10-CM | POA: Diagnosis not present

## 2019-12-06 DIAGNOSIS — I10 Essential (primary) hypertension: Secondary | ICD-10-CM | POA: Diagnosis not present

## 2019-12-06 LAB — SARS CORONAVIRUS 2 (TAT 6-24 HRS): SARS Coronavirus 2: NEGATIVE

## 2019-12-06 NOTE — Progress Notes (Addendum)
Spoke w/ via phone for pre-op interview---patient Lab needs dos----   I stat 8,  ekg            COVID test ------12-06-2019 at  at 1300 pm Arrive at -------1215 pm No food after midnight, clear liquids until 800 then npo Medications to take morning of surgery -----simvastatin, sertraline, zyrtec Diabetic medication -----none day of surgery Patient Special Instructions -----none Pre-Op special Istructions -----none Patient verbalized understanding of instructions that were given at this phone interview. Patient denies shortness of breath, chest pain, fever, cough a this phone interview.  Addendum: patient stated he does not check blood sugar at home, last hemaglobin a1c  was 10 a month ago. . Pt  Instructed  to eat proper diabetic foods from now until surgery. Pt vocalized understanding.

## 2019-12-08 NOTE — Anesthesia Preprocedure Evaluation (Addendum)
Anesthesia Evaluation  Patient identified by MRN, date of birth, ID band Patient awake    Reviewed: Allergy & Precautions, NPO status , Patient's Chart, lab work & pertinent test results  History of Anesthesia Complications Negative for: history of anesthetic complications  Airway Mallampati: II  TM Distance: >3 FB Neck ROM: Full    Dental no notable dental hx. (+) Teeth Intact, Dental Advisory Given   Pulmonary neg pulmonary ROS,    Pulmonary exam normal breath sounds clear to auscultation       Cardiovascular hypertension, Pt. on medications Normal cardiovascular exam Rhythm:Regular Rate:Normal  HLD   Neuro/Psych negative neurological ROS  negative psych ROS   GI/Hepatic Neg liver ROS, GERD  Controlled,  Endo/Other  diabetes, Type 2, Oral Hypoglycemic Agents  Renal/GU negative Renal ROS   Phimosis    Musculoskeletal negative musculoskeletal ROS (+)   Abdominal   Peds  Hematology negative hematology ROS (+)   Anesthesia Other Findings Day of surgery medications reviewed with patient.  Reproductive/Obstetrics negative OB ROS                           Anesthesia Physical Anesthesia Plan  ASA: II  Anesthesia Plan: General   Post-op Pain Management:    Induction: Intravenous  PONV Risk Score and Plan: 3 and Treatment may vary due to age or medical condition, Ondansetron and Dexamethasone  Airway Management Planned: LMA  Additional Equipment: None  Intra-op Plan:   Post-operative Plan: Extubation in OR  Informed Consent:   Plan Discussed with:   Anesthesia Plan Comments:         Anesthesia Quick Evaluation

## 2019-12-09 ENCOUNTER — Ambulatory Visit (HOSPITAL_BASED_OUTPATIENT_CLINIC_OR_DEPARTMENT_OTHER): Payer: Medicare Other | Admitting: Anesthesiology

## 2019-12-09 ENCOUNTER — Other Ambulatory Visit: Payer: Self-pay

## 2019-12-09 ENCOUNTER — Encounter (HOSPITAL_BASED_OUTPATIENT_CLINIC_OR_DEPARTMENT_OTHER): Payer: Self-pay | Admitting: Urology

## 2019-12-09 ENCOUNTER — Encounter (HOSPITAL_BASED_OUTPATIENT_CLINIC_OR_DEPARTMENT_OTHER)
Admission: RE | Disposition: A | Discharge status: Home / Self Care | Payer: Self-pay | Source: Home / Self Care | Attending: Urology

## 2019-12-09 ENCOUNTER — Ambulatory Visit (HOSPITAL_BASED_OUTPATIENT_CLINIC_OR_DEPARTMENT_OTHER)
Admission: RE | Admit: 2019-12-09 | Discharge: 2019-12-09 | Disposition: A | Discharge status: Home / Self Care | Payer: Medicare Other | Attending: Urology | Admitting: Urology

## 2019-12-09 DIAGNOSIS — I10 Essential (primary) hypertension: Secondary | ICD-10-CM | POA: Diagnosis not present

## 2019-12-09 DIAGNOSIS — E119 Type 2 diabetes mellitus without complications: Secondary | ICD-10-CM | POA: Diagnosis not present

## 2019-12-09 DIAGNOSIS — Z7984 Long term (current) use of oral hypoglycemic drugs: Secondary | ICD-10-CM | POA: Insufficient documentation

## 2019-12-09 DIAGNOSIS — N471 Phimosis: Secondary | ICD-10-CM | POA: Insufficient documentation

## 2019-12-09 DIAGNOSIS — Z20822 Contact with and (suspected) exposure to covid-19: Secondary | ICD-10-CM | POA: Insufficient documentation

## 2019-12-09 HISTORY — DX: Gastro-esophageal reflux disease without esophagitis: K21.9

## 2019-12-09 HISTORY — DX: Essential (primary) hypertension: I10

## 2019-12-09 HISTORY — DX: Other chronic pain: G89.29

## 2019-12-09 HISTORY — DX: Other chronic pain: M54.9

## 2019-12-09 HISTORY — PX: CIRCUMCISION: SHX1350

## 2019-12-09 LAB — POCT I-STAT, CHEM 8
BUN: 15 mg/dL (ref 8–23)
Calcium, Ion: 1.16 mmol/L (ref 1.15–1.40)
Chloride: 103 mmol/L (ref 98–111)
Creatinine, Ser: 0.6 mg/dL — ABNORMAL LOW (ref 0.61–1.24)
Glucose, Bld: 156 mg/dL — ABNORMAL HIGH (ref 70–99)
HCT: 41 % (ref 39.0–52.0)
Hemoglobin: 13.9 g/dL (ref 13.0–17.0)
Potassium: 3.7 mmol/L (ref 3.5–5.1)
Sodium: 141 mmol/L (ref 135–145)
TCO2: 29 mmol/L (ref 22–32)

## 2019-12-09 LAB — GLUCOSE, CAPILLARY: Glucose-Capillary: 142 mg/dL — ABNORMAL HIGH (ref 70–99)

## 2019-12-09 SURGERY — CIRCUMCISION, ADULT
Anesthesia: General | Site: Penis

## 2019-12-09 MED ORDER — OXYCODONE-ACETAMINOPHEN 5-325 MG PO TABS: 1.0000 | ORAL_TABLET | Freq: Four times a day (QID) | ORAL | 0 refills | Status: DC | PRN

## 2019-12-09 MED ORDER — FENTANYL CITRATE (PF) 100 MCG/2ML IJ SOLN
INTRAMUSCULAR | Status: AC
  Filled 2019-12-09: qty 2

## 2019-12-09 MED ORDER — EPHEDRINE SULFATE-NACL 50-0.9 MG/10ML-% IV SOSY
PREFILLED_SYRINGE | INTRAVENOUS | Status: DC | PRN
  Administered 2019-12-09: 10 mg via INTRAVENOUS

## 2019-12-09 MED ORDER — CLINDAMYCIN PHOSPHATE 600 MG/50ML IV SOLN
INTRAVENOUS | Status: AC
  Filled 2019-12-09: qty 50

## 2019-12-09 MED ORDER — ONDANSETRON HCL 4 MG/2ML IJ SOLN
INTRAMUSCULAR | Status: AC
  Filled 2019-12-09: qty 2

## 2019-12-09 MED ORDER — LIDOCAINE 2% (20 MG/ML) 5 ML SYRINGE
INTRAMUSCULAR | Status: AC
  Filled 2019-12-09: qty 5

## 2019-12-09 MED ORDER — MIDAZOLAM HCL 5 MG/5ML IJ SOLN
INTRAMUSCULAR | Status: DC | PRN
  Administered 2019-12-09: 2 mg via INTRAVENOUS

## 2019-12-09 MED ORDER — BUPIVACAINE HCL 0.25 % IJ SOLN
INTRAMUSCULAR | Status: DC | PRN
  Administered 2019-12-09 (×2): 10 mL

## 2019-12-09 MED ORDER — PROMETHAZINE HCL 25 MG/ML IJ SOLN
6.2500 mg | INTRAMUSCULAR | Status: DC | PRN
  Filled 2019-12-09: qty 1

## 2019-12-09 MED ORDER — DEXAMETHASONE SODIUM PHOSPHATE 10 MG/ML IJ SOLN
INTRAMUSCULAR | Status: DC | PRN
  Administered 2019-12-09: 10 mg via INTRAVENOUS

## 2019-12-09 MED ORDER — PROPOFOL 10 MG/ML IV BOLUS
INTRAVENOUS | Status: DC | PRN
  Administered 2019-12-09: 180 mg via INTRAVENOUS

## 2019-12-09 MED ORDER — SENNOSIDES-DOCUSATE SODIUM 8.6-50 MG PO TABS: 1.0000 | ORAL_TABLET | Freq: Two times a day (BID) | ORAL | 0 refills | Status: DC

## 2019-12-09 MED ORDER — ONDANSETRON HCL 4 MG/2ML IJ SOLN
INTRAMUSCULAR | Status: DC | PRN
  Administered 2019-12-09: 4 mg via INTRAVENOUS

## 2019-12-09 MED ORDER — LACTATED RINGERS IV SOLN
INTRAVENOUS | Status: DC
  Filled 2019-12-09: qty 1000

## 2019-12-09 MED ORDER — ACETAMINOPHEN 500 MG PO TABS
1000.0000 mg | ORAL_TABLET | Freq: Once | ORAL | Status: AC
  Administered 2019-12-09: 1000 mg via ORAL
  Filled 2019-12-09: qty 2

## 2019-12-09 MED ORDER — PROPOFOL 10 MG/ML IV BOLUS
INTRAVENOUS | Status: AC
  Filled 2019-12-09: qty 20

## 2019-12-09 MED ORDER — MIDAZOLAM HCL 2 MG/2ML IJ SOLN
INTRAMUSCULAR | Status: AC
  Filled 2019-12-09: qty 2

## 2019-12-09 MED ORDER — CLINDAMYCIN PHOSPHATE 600 MG/50ML IV SOLN
600.0000 mg | INTRAVENOUS | Status: AC
  Administered 2019-12-09: 600 mg via INTRAVENOUS
  Filled 2019-12-09: qty 50

## 2019-12-09 MED ORDER — FENTANYL CITRATE (PF) 100 MCG/2ML IJ SOLN
INTRAMUSCULAR | Status: DC | PRN
  Administered 2019-12-09: 100 ug via INTRAVENOUS

## 2019-12-09 MED ORDER — DEXAMETHASONE SODIUM PHOSPHATE 10 MG/ML IJ SOLN
INTRAMUSCULAR | Status: AC
  Filled 2019-12-09: qty 1

## 2019-12-09 MED ORDER — FENTANYL CITRATE (PF) 100 MCG/2ML IJ SOLN
25.0000 ug | INTRAMUSCULAR | Status: DC | PRN
  Filled 2019-12-09: qty 1

## 2019-12-09 MED ORDER — ACETAMINOPHEN 500 MG PO TABS
ORAL_TABLET | ORAL | Status: AC
  Filled 2019-12-09: qty 1

## 2019-12-09 MED ORDER — ACETAMINOPHEN 500 MG PO TABS
1000.0000 mg | ORAL_TABLET | Freq: Once | ORAL | Status: DC
  Filled 2019-12-09: qty 2

## 2019-12-09 SURGICAL SUPPLY — 29 items
BLADE SURG 15 STRL LF DISP TIS (BLADE) ×1 IMPLANT
BLADE SURG 15 STRL SS (BLADE) ×1
BNDG COHESIVE 1X5 TAN NS LF (GAUZE/BANDAGES/DRESSINGS) ×2 IMPLANT
BNDG CONFORM 2 STRL LF (GAUZE/BANDAGES/DRESSINGS) ×2 IMPLANT
COVER BACK TABLE 60X90IN (DRAPES) ×2 IMPLANT
COVER MAYO STAND STRL (DRAPES) ×2 IMPLANT
COVER WAND RF STERILE (DRAPES) ×2 IMPLANT
DRAPE LAPAROTOMY 100X72 PEDS (DRAPES) ×2 IMPLANT
ELECT REM PT RETURN 9FT ADLT (ELECTROSURGICAL) ×2
ELECTRODE REM PT RTRN 9FT ADLT (ELECTROSURGICAL) ×1 IMPLANT
GAUZE XEROFORM 1X8 LF (GAUZE/BANDAGES/DRESSINGS) ×2 IMPLANT
GLOVE BIO SURGEON STRL SZ7.5 (GLOVE) ×4 IMPLANT
GLOVE BIOGEL PI IND STRL 8.5 (GLOVE) ×1 IMPLANT
GLOVE BIOGEL PI INDICATOR 8.5 (GLOVE) ×1
GLOVE SURG SS PI 8.5 STRL IVOR (GLOVE) ×1
GLOVE SURG SS PI 8.5 STRL STRW (GLOVE) ×1 IMPLANT
GOWN STRL REUS W/ TWL XL LVL3 (GOWN DISPOSABLE) ×1 IMPLANT
GOWN STRL REUS W/TWL LRG LVL3 (GOWN DISPOSABLE) ×2 IMPLANT
GOWN STRL REUS W/TWL XL LVL3 (GOWN DISPOSABLE) ×1
KIT TURNOVER CYSTO (KITS) ×2 IMPLANT
NEEDLE HYPO 25X1 1.5 SAFETY (NEEDLE) ×2 IMPLANT
NS IRRIG 500ML POUR BTL (IV SOLUTION) ×2 IMPLANT
PACK BASIN DAY SURGERY FS (CUSTOM PROCEDURE TRAY) ×2 IMPLANT
PENCIL BUTTON HOLSTER BLD 10FT (ELECTRODE) ×2 IMPLANT
SUT VIC AB 3-0 SH 27 (SUTURE) ×3
SUT VIC AB 3-0 SH 27X BRD (SUTURE) ×3 IMPLANT
SYR CONTROL 10ML LL (SYRINGE) ×2 IMPLANT
TOWEL OR 17X26 10 PK STRL BLUE (TOWEL DISPOSABLE) ×2 IMPLANT
TRAY DSU PREP LF (CUSTOM PROCEDURE TRAY) ×2 IMPLANT

## 2019-12-09 NOTE — Brief Op Note (Signed)
12/09/2019  2:53 PM  PATIENT:  Hector Lopez  74 y.o. male  PRE-OPERATIVE DIAGNOSIS:  PHIMOSIS  POST-OPERATIVE DIAGNOSIS:  PHIMOSIS  PROCEDURE:  Procedure(s) with comments: CIRCUMCISION ADULT (N/A) - 45 MINS PENILE BLOCK  SURGEON:  Surgeon(s) and Role:    * Alexis Frock, MD - Primary  PHYSICIAN ASSISTANT:   ASSISTANTS: none   ANESTHESIA:   local and general  EBL:  5 mL   BLOOD ADMINISTERED:none  DRAINS: none   LOCAL MEDICATIONS USED:  MARCAINE    and LIDOCAINE   SPECIMEN:  Source of Specimen:  foreskin  DISPOSITION OF SPECIMEN:  discard  COUNTS:  YES  TOURNIQUET:  * No tourniquets in log *  DICTATION: .Other Dictation: Dictation Number [id number]  PLAN OF CARE: Discharge to home after PACU  PATIENT DISPOSITION:  PACU - hemodynamically stable.   Delay start of Pharmacological VTE agent (>24hrs) due to surgical blood loss or risk of bleeding: not applicable

## 2019-12-09 NOTE — Op Note (Signed)
NAMELUNDON, SPORT MEDICAL RECORD [id number] ACCOUNT [id number] DATE OF BIRTH:[birth year] FACILITY: [facility] LOCATION: [facility] PHYSICIAN:Suzan Manon Tresa Moore, MD  OPERATIVE REPORT  DATE OF PROCEDURE:  12/09/2019  SURGEON:  Alexis Frock, MD  PREOPERATIVE DIAGNOSIS:  Phimosis.  PROCEDURES: 1.  Circumcision. 2.  Penile block.  ESTIMATED BLOOD LOSS:  Nil.  MEDICATIONS:  None.  SPECIMEN:  Foreskin for discard.  FINDINGS: 1.  Significant phimosis and nonretractable foreskin for circumcision. 2.  Complete resolution of all phimotic foreskin post-circumcision.  INDICATIONS:  The patient is a 74 year old man with a longstanding history of progressive phimosis.  He is diabetic without complications.  This is interfering with hygiene and in his pasttimes, including swimming, which he enjoys in the summer time.  He  has been considering management options, including further observation versus topicals versus surgery of circumcision and he wished to proceed with the latter.  Informed consent was obtained and placed in the medical record.  DESCRIPTION OF PROCEDURE:  The patient being identified, the procedure being circumcision was confirmed.  Procedure timeout was performed.  IV antibiotics administered.  General LMA anesthesia induced.  The patient was placed in the supine position.  A  sterile field was created, prepping and draping his penis, perineum using iodine.  Notably, he his nonretractable foreskin prohibited adequate prepping of the inner foreskin leaflet at this time.  The area of the proximal collar  foreskin corresponding  to the unstretched corona of the glans was marked with a marking pen, as was the 12 o'clock position and a straight hemostat was used to allow apposition of the phimotic ring, held for 20 seconds, then incised with straight scissors, which allowed  reduction of the phimotic ring enough to deliver the glans penis, which was prepped further using iodine.  Next, a  proximal collar was marked at corresponding position approximately 7 mm proximal to the corona of the glans and 2 circumferential incisions  were made at the proximal collar and distal collar respectively.  Four hemostats were applied at the 12 o'clock position and the redundant preputial tissue was released from underlying tissue using cautery dissection.  The redundant foreskin was set  aside for discard.  There were no obvious lesions concerning for carcinoma.  The frenulum was trimmed for cosmesis and reapproximated using a U stitch x2.  A stay stitch of Vicryl was placed at the 6 o'clock and 12 o'clock positions and then the skin  edges reapproximated using running 3-0 Vicryl on each side, which showed an excellent apposition and cosmesis.  Attention was then directed to penile block.  Ten mL of a 50:50 slurry of Marcaine and lidocaine was infiltrated at the presumed course of the dorsal penile nerve and another 10 mL at the base of the penis in a ring block type fashion, thus performing a penile block.  Finally, a dressing of Xeroform  at the area of skin reapproximation, followed by Doreene Nest and Coban was loosely applied.  Anesthesia was terminated.  The patient tolerated the procedure well.  No immediate perioperative complications.  The patient was taken to the postanesthesia care  unit in stable condition, with plan for discharge home.  VN/NUANCE  D:12/09/2019 T:12/09/2019 JOB:[id number]

## 2019-12-09 NOTE — Discharge Instructions (Signed)
  Post Anesthesia Home Care Instructions  Activity: Get plenty of rest for the remainder of the day. A responsible adult should stay with you for 24 hours following the procedure.  For the next 24 hours, DO NOT: -Drive a car -Paediatric nurse -Drink alcoholic beverages -Take any medication unless instructed by your physician -Make any legal decisions or sign important papers.  Meals: Start with liquid foods such as gelatin or soup. Progress to regular foods as tolerated. Avoid greasy, spicy, heavy foods. If nausea and/or vomiting occur, drink only clear liquids until the nausea and/or vomiting subsides. Call your physician if vomiting continues.  Special Instructions/Symptoms: Your throat may feel dry or sore from the anesthesia or the breathing tube placed in your throat during surgery. If this causes discomfort, gargle with warm salt water. The discomfort should disappear within 24 hours.  If you had a scopolamine patch placed behind your ear for the management of post- operative nausea and/or vomiting:  1. The medication in the patch is effective for 72 hours, after which it should be removed.  Wrap patch in a tissue and discard in the trash. Wash hands thoroughly with soap and water. 2. You may remove the patch earlier than 72 hours if you experience unpleasant side effects which may include dry mouth, dizziness or visual disturbances. 3. Avoid touching the patch. Wash your hands with soap and water after contact with the patch.   1 - Remove dressing tomorrow morning at home. You may shower anytime. No sexual stimulation x 3 weeks. All stitches are dissolvable and will dissapear in about 3 weeks.   2 - Call MD or go to ER for fever >102, severe pain / nausea / vomiting not relieved by medications, or acute change in medical status

## 2019-12-09 NOTE — Anesthesia Procedure Notes (Signed)
Procedure Name: LMA Insertion Date/Time: 12/09/2019 2:11 PM Performed by: Latorsha Curling D, CRNA Pre-anesthesia Checklist: Patient identified, Emergency Drugs available, Suction available and Patient being monitored Patient Re-evaluated:Patient Re-evaluated prior to induction Oxygen Delivery Method: Circle system utilized Preoxygenation: Pre-oxygenation with 100% oxygen Induction Type: IV induction Ventilation: Mask ventilation without difficulty LMA: LMA inserted LMA Size: 5.0 Tube type: Oral Number of attempts: 1 Placement Confirmation: positive ETCO2 and breath sounds checked- equal and bilateral Tube secured with: Tape Dental Injury: Teeth and Oropharynx as per pre-operative assessment

## 2019-12-09 NOTE — Transfer of Care (Signed)
Immediate Anesthesia Transfer of Care Note  Patient: Hector Lopez  Procedure(s) Performed: CIRCUMCISION ADULT (N/A Penis)  Patient Location: PACU  Anesthesia Type:General  Level of Consciousness: awake, alert  and oriented  Airway & Oxygen Therapy: Patient Spontanous Breathing and Patient connected to nasal cannula oxygen  Post-op Assessment: Report given to RN  Post vital signs: Reviewed and stable  Last Vitals:  Vitals Value Taken Time  BP 152/81 12/09/19 1454  Temp    Pulse 74 12/09/19 1456  Resp 16 12/09/19 1456  SpO2 99 % 12/09/19 1456  Vitals shown include unvalidated device data.  Last Pain:  Vitals:   12/09/19 1213  TempSrc: Oral         Complications: No apparent anesthesia complications

## 2019-12-09 NOTE — H&P (Signed)
Hector Lopez is an 74 y.o. male.    Chief Complaint: Pre-Op Circumcision  HPI:   1 -Phimosis - progressive tightening and now inability to retract foreskn. INterfears with hygeine / voiding and pain when in the pool in the summer. 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 NO CV disease / blood thinners. His PCP is Judd Lien MD.   Today " Hector Lopez " is seen as new patient for phimosis.     Past Medical History:  Diagnosis Date  . BPH (benign prostatic hyperplasia)   . Chronic back pain    lower  . Diabetes mellitus without complication ([place])    type 2  . GERD (gastroesophageal reflux disease)   . Hyperlipidemia   . Hypertension   . Iron deficiency anemia    hx of  . Vitamin B 12 deficiency    pt not sure    Past Surgical History:  Procedure Laterality Date  . colonscopy     x 2  . NO PAST SURGERIES      History reviewed. No pertinent family history. Social History:  reports that he has never smoked. He has never used smokeless tobacco. He reports previous alcohol use. He reports that he does not use drugs.  Allergies:  Allergies  Allergen Reactions  . Codeine Nausea And Vomiting    No medications prior to admission.    No results found for this or any previous visit (from the past 48 hour(s)). No results found.  Review of Systems  Genitourinary: Positive for penile pain.  All other systems reviewed and are negative.   Height 5\' 11"  (1.803 m), weight 77.1 kg. Physical Exam  Constitutional: He appears well-developed.  HENT:  Head: Normocephalic.  Eyes: Pupils are equal, round, and reactive to light.  Cardiovascular: Normal rate.  Respiratory: Effort normal.  Genitourinary:    Genitourinary Comments: Moderate phimosis w/o active balanitis.    Musculoskeletal:        General: Normal range of motion.     Cervical back: Normal range of motion.  Neurological: He is alert.  Skin: Skin is warm.      Assessment/Plan  Proceed as planned with circumcision. Risks, benefits, alternatives, expected peri-op cousre discussed previously and reiterated today.   Alexis Frock, MD 12/09/2019, 7:35 AM

## 2019-12-09 NOTE — Anesthesia Postprocedure Evaluation (Signed)
Anesthesia Post Note  Patient: Hector Lopez  Procedure(s) Performed: CIRCUMCISION ADULT (N/A Penis)     Patient location during evaluation: PACU Anesthesia Type: General Level of consciousness: awake and alert Pain management: pain level controlled Vital Signs Assessment: post-procedure vital signs reviewed and stable Respiratory status: spontaneous breathing, nonlabored ventilation, respiratory function stable and patient connected to nasal cannula oxygen Cardiovascular status: blood pressure returned to baseline and stable Postop Assessment: no apparent nausea or vomiting Anesthetic complications: no    Last Vitals:  Vitals:   12/09/19 1521 12/09/19 1523  BP:    Pulse: 74   Resp: 16   Temp:  36.8 C  SpO2: 96%     Last Pain:  Vitals:   12/09/19 1523  TempSrc: Oral                 Effie Berkshire

## 2020-01-02 DIAGNOSIS — Z125 Encounter for screening for malignant neoplasm of prostate: Secondary | ICD-10-CM | POA: Diagnosis not present

## 2020-01-02 DIAGNOSIS — N471 Phimosis: Secondary | ICD-10-CM | POA: Diagnosis not present

## 2020-01-02 DIAGNOSIS — R310 Gross hematuria: Secondary | ICD-10-CM | POA: Diagnosis not present

## 2020-01-05 DIAGNOSIS — R310 Gross hematuria: Secondary | ICD-10-CM | POA: Diagnosis not present

## 2020-01-06 ENCOUNTER — Ambulatory Visit: Payer: Self-pay | Admitting: Urology

## 2020-01-06 DIAGNOSIS — Z6825 Body mass index (BMI) 25.0-25.9, adult: Secondary | ICD-10-CM | POA: Diagnosis not present

## 2020-01-06 DIAGNOSIS — E1169 Type 2 diabetes mellitus with other specified complication: Secondary | ICD-10-CM | POA: Diagnosis not present

## 2020-01-06 DIAGNOSIS — N471 Phimosis: Secondary | ICD-10-CM | POA: Diagnosis not present

## 2020-01-06 DIAGNOSIS — E782 Mixed hyperlipidemia: Secondary | ICD-10-CM | POA: Diagnosis not present

## 2020-01-06 DIAGNOSIS — I10 Essential (primary) hypertension: Secondary | ICD-10-CM | POA: Diagnosis not present

## 2020-01-09 ENCOUNTER — Other Ambulatory Visit: Payer: Self-pay | Admitting: Urology

## 2020-01-16 ENCOUNTER — Other Ambulatory Visit: Payer: Self-pay

## 2020-01-16 ENCOUNTER — Encounter (HOSPITAL_BASED_OUTPATIENT_CLINIC_OR_DEPARTMENT_OTHER): Payer: Self-pay | Admitting: Urology

## 2020-01-16 NOTE — Progress Notes (Signed)
Spoke w/ via phone for pre-op interview--- PT Lab needs dos---- Istat 8              Lab results------ current ekg in chart/ epic COVID test ------ 01-17-2020 @ 1405@AP  Arrive at ------- 1230 NPO after ------ MN w/ exception clear liquids until 0830 then nothing by mouth (no cream/ milk products) Medications to take morning of surgery ----- Zocor, Zoloft, Zyrtec w/ sips of water Diabetic medication ----- do not take glipizide/ metformin morning of surgery Patient Special Instructions ----- n/a Pre-Op special Istructions ----- n/a Patient verbalized understanding of instructions that were given at this phone interview. Patient denies shortness of breath, chest pain, fever, cough a this phone interview.   PCP: Dr Judd Lien  Cardiologist : no Chest x-ray : no EKG : 12-09-2019 epic Echo :no Stress test:  Per pt stated had one approx. 1980s, told normal Cardiac Cath :  no Sleep Study/ CPAP : NO Fasting Blood Sugar :      / Checks Blood Sugar -- times a day:   Pt stated just obtained a monitor to start checking blood sugar at home yesterday Blood Thinner/ Instructions /Last Dose: NO ASA / Instructions/ Last Dose :  NO

## 2020-01-17 ENCOUNTER — Other Ambulatory Visit (HOSPITAL_COMMUNITY)
Admission: RE | Admit: 2020-01-17 | Discharge: 2020-01-17 | Disposition: A | Discharge status: Home / Self Care | Payer: Medicare Other | Source: Ambulatory Visit | Attending: Urology | Admitting: Urology

## 2020-01-17 DIAGNOSIS — Z01812 Encounter for preprocedural laboratory examination: Secondary | ICD-10-CM | POA: Diagnosis not present

## 2020-01-17 DIAGNOSIS — Z20822 Contact with and (suspected) exposure to covid-19: Secondary | ICD-10-CM | POA: Diagnosis not present

## 2020-01-18 LAB — SARS CORONAVIRUS 2 (TAT 6-24 HRS): SARS Coronavirus 2: NEGATIVE

## 2020-01-20 ENCOUNTER — Encounter (HOSPITAL_BASED_OUTPATIENT_CLINIC_OR_DEPARTMENT_OTHER)
Admission: RE | Disposition: A | Discharge status: Home / Self Care | Payer: Self-pay | Source: Home / Self Care | Attending: Urology

## 2020-01-20 ENCOUNTER — Ambulatory Visit (HOSPITAL_BASED_OUTPATIENT_CLINIC_OR_DEPARTMENT_OTHER)
Admission: RE | Admit: 2020-01-20 | Discharge: 2020-01-20 | Disposition: A | Discharge status: Home / Self Care | Payer: Medicare Other | Attending: Urology | Admitting: Urology

## 2020-01-20 ENCOUNTER — Encounter (HOSPITAL_BASED_OUTPATIENT_CLINIC_OR_DEPARTMENT_OTHER): Payer: Self-pay | Admitting: Urology

## 2020-01-20 ENCOUNTER — Ambulatory Visit (HOSPITAL_BASED_OUTPATIENT_CLINIC_OR_DEPARTMENT_OTHER): Payer: Medicare Other | Admitting: Anesthesiology

## 2020-01-20 ENCOUNTER — Other Ambulatory Visit: Payer: Self-pay

## 2020-01-20 DIAGNOSIS — R310 Gross hematuria: Secondary | ICD-10-CM | POA: Diagnosis not present

## 2020-01-20 DIAGNOSIS — G8929 Other chronic pain: Secondary | ICD-10-CM | POA: Diagnosis not present

## 2020-01-20 DIAGNOSIS — M545 Low back pain: Secondary | ICD-10-CM | POA: Insufficient documentation

## 2020-01-20 DIAGNOSIS — K219 Gastro-esophageal reflux disease without esophagitis: Secondary | ICD-10-CM | POA: Insufficient documentation

## 2020-01-20 DIAGNOSIS — Z885 Allergy status to narcotic agent status: Secondary | ICD-10-CM | POA: Diagnosis not present

## 2020-01-20 DIAGNOSIS — R319 Hematuria, unspecified: Secondary | ICD-10-CM | POA: Insufficient documentation

## 2020-01-20 DIAGNOSIS — N2889 Other specified disorders of kidney and ureter: Secondary | ICD-10-CM | POA: Insufficient documentation

## 2020-01-20 DIAGNOSIS — Z9001 Acquired absence of eye: Secondary | ICD-10-CM | POA: Diagnosis not present

## 2020-01-20 DIAGNOSIS — N289 Disorder of kidney and ureter, unspecified: Secondary | ICD-10-CM | POA: Diagnosis present

## 2020-01-20 DIAGNOSIS — C661 Malignant neoplasm of right ureter: Secondary | ICD-10-CM | POA: Diagnosis not present

## 2020-01-20 DIAGNOSIS — I10 Essential (primary) hypertension: Secondary | ICD-10-CM | POA: Diagnosis not present

## 2020-01-20 DIAGNOSIS — D0919 Carcinoma in situ of other urinary organs: Secondary | ICD-10-CM | POA: Insufficient documentation

## 2020-01-20 DIAGNOSIS — E119 Type 2 diabetes mellitus without complications: Secondary | ICD-10-CM | POA: Diagnosis not present

## 2020-01-20 DIAGNOSIS — E782 Mixed hyperlipidemia: Secondary | ICD-10-CM | POA: Insufficient documentation

## 2020-01-20 DIAGNOSIS — Z7984 Long term (current) use of oral hypoglycemic drugs: Secondary | ICD-10-CM | POA: Diagnosis not present

## 2020-01-20 DIAGNOSIS — N471 Phimosis: Secondary | ICD-10-CM | POA: Diagnosis not present

## 2020-01-20 DIAGNOSIS — D4121 Neoplasm of uncertain behavior of right ureter: Secondary | ICD-10-CM | POA: Diagnosis not present

## 2020-01-20 HISTORY — DX: Unspecified symptoms and signs involving the genitourinary system: R39.9

## 2020-01-20 HISTORY — DX: Hematuria, unspecified: R31.9

## 2020-01-20 HISTORY — DX: Acquired absence of eye: Z90.01

## 2020-01-20 HISTORY — PX: CYSTOSCOPY WITH RETROGRADE PYELOGRAM, URETEROSCOPY AND STENT PLACEMENT: SHX5789

## 2020-01-20 HISTORY — DX: Personal history of diseases of the blood and blood-forming organs and certain disorders involving the immune mechanism: Z86.2

## 2020-01-20 HISTORY — DX: Presence of spectacles and contact lenses: Z97.3

## 2020-01-20 HISTORY — DX: Type 2 diabetes mellitus without complications: E11.9

## 2020-01-20 HISTORY — DX: Other specified disorders of kidney and ureter: N28.89

## 2020-01-20 HISTORY — DX: Mixed hyperlipidemia: E78.2

## 2020-01-20 LAB — POCT I-STAT, CHEM 8
BUN: 19 mg/dL (ref 8–23)
Calcium, Ion: 1.31 mmol/L (ref 1.15–1.40)
Chloride: 105 mmol/L (ref 98–111)
Creatinine, Ser: 0.7 mg/dL (ref 0.61–1.24)
Glucose, Bld: 161 mg/dL — ABNORMAL HIGH (ref 70–99)
HCT: 37 % — ABNORMAL LOW (ref 39.0–52.0)
Hemoglobin: 12.6 g/dL — ABNORMAL LOW (ref 13.0–17.0)
Potassium: 3.6 mmol/L (ref 3.5–5.1)
Sodium: 143 mmol/L (ref 135–145)
TCO2: 26 mmol/L (ref 22–32)

## 2020-01-20 LAB — GLUCOSE, CAPILLARY: Glucose-Capillary: 130 mg/dL — ABNORMAL HIGH (ref 70–99)

## 2020-01-20 SURGERY — CYSTOURETEROSCOPY, WITH RETROGRADE PYELOGRAM AND STENT INSERTION
Anesthesia: General

## 2020-01-20 MED ORDER — LIDOCAINE HCL (CARDIAC) PF 100 MG/5ML IV SOSY
PREFILLED_SYRINGE | INTRAVENOUS | Status: DC | PRN
  Administered 2020-01-20: 100 mg via INTRAVENOUS

## 2020-01-20 MED ORDER — FENTANYL CITRATE (PF) 100 MCG/2ML IJ SOLN: 25.0000 ug | INTRAMUSCULAR | Status: DC | PRN

## 2020-01-20 MED ORDER — FENTANYL CITRATE (PF) 100 MCG/2ML IJ SOLN
INTRAMUSCULAR | Status: AC
  Filled 2020-01-20: qty 2

## 2020-01-20 MED ORDER — ONDANSETRON HCL 4 MG/2ML IJ SOLN
INTRAMUSCULAR | Status: DC | PRN
  Administered 2020-01-20: 4 mg via INTRAVENOUS

## 2020-01-20 MED ORDER — PHENYLEPHRINE HCL (PRESSORS) 10 MG/ML IV SOLN
INTRAVENOUS | Status: DC | PRN
  Administered 2020-01-20: 80 ug via INTRAVENOUS

## 2020-01-20 MED ORDER — OXYCODONE-ACETAMINOPHEN 5-325 MG PO TABS: 1.0000 | ORAL_TABLET | Freq: Four times a day (QID) | ORAL | 0 refills | Status: DC | PRN

## 2020-01-20 MED ORDER — GENTAMICIN SULFATE 40 MG/ML IJ SOLN
5.0000 mg/kg | INTRAVENOUS | Status: AC
  Administered 2020-01-20: 396.5 mg via INTRAVENOUS
  Filled 2020-01-20: qty 10

## 2020-01-20 MED ORDER — LACTATED RINGERS IV SOLN: INTRAVENOUS | Status: DC

## 2020-01-20 MED ORDER — FENTANYL CITRATE (PF) 100 MCG/2ML IJ SOLN
INTRAMUSCULAR | Status: DC | PRN
  Administered 2020-01-20 (×5): 25 ug via INTRAVENOUS

## 2020-01-20 MED ORDER — PHENYLEPHRINE 40 MCG/ML (10ML) SYRINGE FOR IV PUSH (FOR BLOOD PRESSURE SUPPORT)
PREFILLED_SYRINGE | INTRAVENOUS | Status: AC
  Filled 2020-01-20: qty 10

## 2020-01-20 MED ORDER — PROPOFOL 10 MG/ML IV BOLUS
INTRAVENOUS | Status: AC
  Filled 2020-01-20: qty 20

## 2020-01-20 MED ORDER — SENNOSIDES-DOCUSATE SODIUM 8.6-50 MG PO TABS
1.0000 | ORAL_TABLET | Freq: Two times a day (BID) | ORAL | 0 refills | Status: DC
Start: 2020-01-20 — End: 2022-03-28

## 2020-01-20 MED ORDER — OXYCODONE HCL 5 MG PO TABS: 5.0000 mg | ORAL_TABLET | Freq: Once | ORAL | Status: DC | PRN

## 2020-01-20 MED ORDER — PROPOFOL 10 MG/ML IV BOLUS
INTRAVENOUS | Status: DC | PRN
  Administered 2020-01-20: 160 mg via INTRAVENOUS
  Administered 2020-01-20 (×2): 40 mg via INTRAVENOUS

## 2020-01-20 MED ORDER — DEXAMETHASONE SODIUM PHOSPHATE 10 MG/ML IJ SOLN
INTRAMUSCULAR | Status: AC
  Filled 2020-01-20: qty 1

## 2020-01-20 MED ORDER — LIDOCAINE 2% (20 MG/ML) 5 ML SYRINGE
INTRAMUSCULAR | Status: AC
  Filled 2020-01-20: qty 5

## 2020-01-20 MED ORDER — IOHEXOL 300 MG/ML  SOLN
INTRAMUSCULAR | Status: DC | PRN
  Administered 2020-01-20: 20 mL via URETHRAL

## 2020-01-20 MED ORDER — DEXAMETHASONE SODIUM PHOSPHATE 4 MG/ML IJ SOLN
INTRAMUSCULAR | Status: DC | PRN
  Administered 2020-01-20: 5 mg via INTRAVENOUS

## 2020-01-20 MED ORDER — GLYCOPYRROLATE PF 0.2 MG/ML IJ SOSY
PREFILLED_SYRINGE | INTRAMUSCULAR | Status: AC
  Filled 2020-01-20: qty 1

## 2020-01-20 MED ORDER — ONDANSETRON HCL 4 MG/2ML IJ SOLN
INTRAMUSCULAR | Status: AC
  Filled 2020-01-20: qty 2

## 2020-01-20 MED ORDER — OXYCODONE HCL 5 MG/5ML PO SOLN: 5.0000 mg | Freq: Once | ORAL | Status: DC | PRN

## 2020-01-20 SURGICAL SUPPLY — 27 items
BAG DRAIN URO-CYSTO SKYTR STRL (DRAIN) ×3 IMPLANT
BASKET LASER NITINOL 1.9FR (BASKET) ×2 IMPLANT
CATH INTERMIT  6FR 70CM (CATHETERS) ×2 IMPLANT
CLOTH BEACON ORANGE TIMEOUT ST (SAFETY) ×3 IMPLANT
DRSG TELFA 3X8 NADH (GAUZE/BANDAGES/DRESSINGS) ×3 IMPLANT
ELECT REM PT RETURN 9FT ADLT (ELECTROSURGICAL) ×3
ELECTRODE REM PT RTRN 9FT ADLT (ELECTROSURGICAL) IMPLANT
GLOVE BIO SURGEON STRL SZ7 (GLOVE) ×2 IMPLANT
GLOVE BIO SURGEON STRL SZ7.5 (GLOVE) ×3 IMPLANT
GLOVE BIOGEL PI IND STRL 7.0 (GLOVE) IMPLANT
GLOVE BIOGEL PI INDICATOR 7.0 (GLOVE) ×2
GOWN STRL REUS W/TWL LRG LVL3 (GOWN DISPOSABLE) ×5 IMPLANT
GUIDEWIRE ANG ZIPWIRE 038X150 (WIRE) ×3 IMPLANT
GUIDEWIRE STR DUAL SENSOR (WIRE) ×3 IMPLANT
IV NS IRRIG 3000ML ARTHROMATIC (IV SOLUTION) ×3 IMPLANT
KIT TURNOVER CYSTO (KITS) ×3 IMPLANT
MANIFOLD NEPTUNE II (INSTRUMENTS) ×3 IMPLANT
NS IRRIG 500ML POUR BTL (IV SOLUTION) ×3 IMPLANT
PAD DRESSING TELFA 3X8 NADH (GAUZE/BANDAGES/DRESSINGS) IMPLANT
STENT POLARIS 5FRX26 (STENTS) ×2 IMPLANT
SYR 10ML LL (SYRINGE) ×3 IMPLANT
TRAY CYSTO PACK (CUSTOM PROCEDURE TRAY) ×3 IMPLANT
TUBE CONNECTING 12'X1/4 (SUCTIONS) ×1
TUBE CONNECTING 12X1/4 (SUCTIONS) ×2 IMPLANT
TUBE FEEDING 8FR 16IN STR KANG (MISCELLANEOUS) ×2 IMPLANT
TUBING UROLOGY SET (TUBING) ×3 IMPLANT
WATER STERILE IRR 3000ML UROMA (IV SOLUTION) ×2 IMPLANT

## 2020-01-20 NOTE — Brief Op Note (Signed)
01/20/2020  3:55 PM  PATIENT:  Hector Lopez  74 y.o. male  PRE-OPERATIVE DIAGNOSIS:  RIGHT URETERAL MASS  POST-OPERATIVE DIAGNOSIS:  RIGHT URETERAL MASS  PROCEDURE:  Procedure(s): CYSTOSCOPY WITH BILATERAL RETROGRADE PYELOGRAM, RIGHT URETEROSCOPY WITH BIOPSY AND STENT PLACEMENT (Bilateral)  SURGEON:  Surgeon(s) and Role:    * Alexis Frock, MD - Primary  PHYSICIAN ASSISTANT:   ASSISTANTS: none   ANESTHESIA:   general  EBL:  minimal   BLOOD ADMINISTERED:none  DRAINS: none   LOCAL MEDICATIONS USED:  NONE  SPECIMEN:  Source of Specimen:  Rt ureteral mass  DISPOSITION OF SPECIMEN:  PATHOLOGY  COUNTS:  YES  TOURNIQUET:  * No tourniquets in log *  DICTATION: .Other Dictation: Dictation Number [id number]  PLAN OF CARE: Discharge to home after PACU  PATIENT DISPOSITION:  PACU - hemodynamically stable.   Delay start of Pharmacological VTE agent (>24hrs) due to surgical blood loss or risk of bleeding: yes

## 2020-01-20 NOTE — Anesthesia Preprocedure Evaluation (Addendum)
Anesthesia Evaluation    Reviewed: Allergy & Precautions, Patient's Chart, lab work & pertinent test results  Airway Mallampati: II  TM Distance: >3 FB Neck ROM: Full    Dental no notable dental hx. (+) Dental Advisory Given, Teeth Intact   Pulmonary neg pulmonary ROS,    Pulmonary exam normal breath sounds clear to auscultation       Cardiovascular hypertension, Pt. on medications negative cardio ROS Normal cardiovascular exam Rhythm:Regular Rate:Normal     Neuro/Psych negative neurological ROS     GI/Hepatic Neg liver ROS, GERD  ,  Endo/Other  negative endocrine ROSdiabetes  Renal/GU Renal disease     Musculoskeletal negative musculoskeletal ROS (+)   Abdominal   Peds  Hematology negative hematology ROS (+)   Anesthesia Other Findings Day of surgery medications reviewed with the patient.  Reproductive/Obstetrics                                                             Anesthesia Evaluation  Patient identified by MRN, date of birth, ID band Patient awake    Reviewed: Allergy & Precautions, NPO status , Patient's Chart, lab work & pertinent test results  History of Anesthesia Complications Negative for: history of anesthetic complications  Airway Mallampati: II  TM Distance: >3 FB Neck ROM: Full    Dental no notable dental hx. (+) Teeth Intact, Dental Advisory Given   Pulmonary neg pulmonary ROS,    Pulmonary exam normal breath sounds clear to auscultation       Cardiovascular hypertension, Pt. on medications Normal cardiovascular exam Rhythm:Regular Rate:Normal  HLD   Neuro/Psych negative neurological ROS  negative psych ROS   GI/Hepatic Neg liver ROS, GERD  Controlled,  Endo/Other  diabetes, Type 2, Oral Hypoglycemic Agents  Renal/GU negative Renal ROS   Phimosis    Musculoskeletal negative musculoskeletal ROS (+)   Abdominal   Peds   Hematology negative hematology ROS (+)   Anesthesia Other Findings Day of surgery medications reviewed with patient.  Reproductive/Obstetrics negative OB ROS                           Anesthesia Physical Anesthesia Plan  ASA: II  Anesthesia Plan: General   Post-op Pain Management:    Induction: Intravenous  PONV Risk Score and Plan: 3 and Treatment may vary due to age or medical condition, Ondansetron and Dexamethasone  Airway Management Planned: LMA  Additional Equipment: None  Intra-op Plan:   Post-operative Plan: Extubation in OR  Informed Consent:   Plan Discussed with:   Anesthesia Plan Comments:         Anesthesia Quick Evaluation                                   Anesthesia Evaluation  Patient identified by MRN, date of birth, ID band Patient awake    Reviewed: Allergy & Precautions, NPO status , Patient's Chart, lab work & pertinent test results  History of Anesthesia Complications Negative for: history of anesthetic complications  Airway Mallampati: II  TM Distance: >3 FB Neck ROM: Full    Dental no notable dental hx. (+) Teeth Intact, Dental Advisory Given   Pulmonary neg pulmonary  ROS,    Pulmonary exam normal breath sounds clear to auscultation       Cardiovascular hypertension, Pt. on medications Normal cardiovascular exam Rhythm:Regular Rate:Normal  HLD   Neuro/Psych negative neurological ROS  negative psych ROS   GI/Hepatic Neg liver ROS, GERD  Controlled,  Endo/Other  diabetes, Type 2, Oral Hypoglycemic Agents  Renal/GU negative Renal ROS   Phimosis    Musculoskeletal negative musculoskeletal ROS (+)   Abdominal   Peds  Hematology negative hematology ROS (+)   Anesthesia Other Findings Day of surgery medications reviewed with patient.  Reproductive/Obstetrics negative OB ROS                           Anesthesia Physical Anesthesia Plan  ASA:  II  Anesthesia Plan: General   Post-op Pain Management:    Induction: Intravenous  PONV Risk Score and Plan: 3 and Treatment may vary due to age or medical condition, Ondansetron and Dexamethasone  Airway Management Planned: LMA  Additional Equipment: None  Intra-op Plan:   Post-operative Plan: Extubation in OR  Informed Consent:   Plan Discussed with:   Anesthesia Plan Comments:         Anesthesia Quick Evaluation  Anesthesia Physical Anesthesia Plan  ASA: II  Anesthesia Plan: General   Post-op Pain Management:    Induction: Intravenous  PONV Risk Score and Plan: 3 and Ondansetron, Treatment may vary due to age or medical condition and Dexamethasone  Airway Management Planned: LMA  Additional Equipment: None  Intra-op Plan:   Post-operative Plan: Extubation in OR  Informed Consent: I have reviewed the patients History and Physical, chart, labs and discussed the procedure including the risks, benefits and alternatives for the proposed anesthesia with the patient or authorized representative who has indicated his/her understanding and acceptance.     Dental advisory given  Plan Discussed with: CRNA  Anesthesia Plan Comments:         Anesthesia Quick Evaluation

## 2020-01-20 NOTE — Discharge Instructions (Signed)
1 - You may have urinary urgency (bladder spasms) and bloody urine on / off with stent in place. This is normal.  2 - Call MD or go to ER for fever >102, severe pain / nausea / vomiting not relieved by medications, or acute change in medical status   Post Anesthesia Home Care Instructions  Activity: Get plenty of rest for the remainder of the day. A responsible adult should stay with you for 24 hours following the procedure.  For the next 24 hours, DO NOT: -Drive a car -Paediatric nurse -Drink alcoholic beverages -Take any medication unless instructed by your physician -Make any legal decisions or sign important papers.  Meals: Start with liquid foods such as gelatin or soup. Progress to regular foods as tolerated. Avoid greasy, spicy, heavy foods. If nausea and/or vomiting occur, drink only clear liquids until the nausea and/or vomiting subsides. Call your physician if vomiting continues.  Special Instructions/Symptoms: Your throat may feel dry or sore from the anesthesia or the breathing tube placed in your throat during surgery. If this causes discomfort, gargle with warm salt water. The discomfort should disappear within 24 hours.  If you had a scopolamine patch placed behind your ear for the management of post- operative nausea and/or vomiting:  1. The medication in the patch is effective for 72 hours, after which it should be removed.  Wrap patch in a tissue and discard in the trash. Wash hands thoroughly with soap and water. 2. You may remove the patch earlier than 72 hours if you experience unpleasant side effects which may include dry mouth, dizziness or visual disturbances. 3. Avoid touching the patch. Wash your hands with soap and water after contact with the patch.   [facility] [phone] Post Ureteroscopy With or Without Stent Instructions  Definitions:  Ureter: The duct that transports urine from the kidney to the bladder. Stent:   A plastic  hollow tube that is placed into the ureter, from the kidney to the                 bladder to prevent the ureter from swelling shut.  GENERAL INSTRUCTIONS:  Despite the fact that no skin incisions were used, the area around the ureter and bladder is raw and irritated. The stent is a foreign body which will further irritate the bladder wall. This irritation is manifested by increased frequency of urination, both day and night, and by an increase in the urge to urinate. In some, the urge to urinate is present almost always. Sometimes the urge is strong enough that you may not be able to stop yourself from urinating. The only real cure is to remove the stent and then give time for the bladder wall to heal which can't be done until the danger of the ureter swelling shut has passed, which varies.  You may see some blood in your urine while the stent is in place and a few days afterwards. Do not be alarmed, even if the urine was clear for a while. Get off your feet and drink lots of fluids until clearing occurs. If you start to pass clots or don't improve, call us.  DIET: You may return to your normal diet immediately. Because of the raw surface of your bladder, alcohol, spicy foods, acid type foods and drinks with caffeine may cause irritation or frequency and should be used in moderation. To keep your urine flowing freely and to avoid constipation, drink plenty of fluids during the day ( 8-10  glasses ). Tip: Avoid cranberry juice because it is very acidic.  ACTIVITY: Your physical activity doesn't need to be restricted. However, if you are very active, you may see some blood in your urine. We suggest that you reduce your activity under these circumstances until the bleeding has stopped.  BOWELS: It is important to keep your bowels regular during the postoperative period. Straining with bowel movements can cause bleeding. A bowel movement every other day is reasonable. Use a mild laxative if needed, such  as Milk of Magnesia 2-3 tablespoons, or 2 Dulcolax tablets. Call if you continue to have problems. If you have been taking narcotics for pain, before, during or after your surgery, you may be constipated. Take a laxative if necessary.   MEDICATION: You should resume your pre-surgery medications unless told not to. In addition you will often be given an antibiotic to prevent infection. These should be taken as prescribed until the bottles are finished unless you are having an unusual reaction to one of the drugs.  PROBLEMS YOU SHOULD REPORT TO [place]:  Fevers over 100.5 Fahrenheit.  Heavy bleeding, or clots ( See above notes about blood in urine ).  Inability to urinate.  Drug reactions ( hives, rash, nausea, vomiting, diarrhea ).  Severe burning or pain with urination that is not improving.  FOLLOW-UP: You will need a follow-up appointment to monitor your progress. Call for this appointment at the number listed above. Usually the first appointment will be about three to fourteen days after your surgery.

## 2020-01-20 NOTE — Anesthesia Procedure Notes (Signed)
Procedure Name: LMA Insertion Date/Time: 01/20/2020 3:06 PM Performed by: Justice Rocher, CRNA Pre-anesthesia Checklist: Patient identified, Emergency Drugs available, Suction available, Patient being monitored and Timeout performed Patient Re-evaluated:Patient Re-evaluated prior to induction Oxygen Delivery Method: Circle system utilized Preoxygenation: Pre-oxygenation with 100% oxygen Induction Type: IV induction Ventilation: Mask ventilation without difficulty LMA: LMA inserted LMA Size: 5.0 Number of attempts: 1 Airway Equipment and Method: Bite block Placement Confirmation: positive ETCO2,  CO2 detector and breath sounds checked- equal and bilateral Tube secured with: Tape Dental Injury: Teeth and Oropharynx as per pre-operative assessment
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
## 2020-01-20 NOTE — Transfer of Care (Signed)
Immediate Anesthesia Transfer of Care Note  Patient: Hector Lopez  Procedure(s) Performed: Procedure(s) (LRB): CYSTOSCOPY WITH BILATERAL RETROGRADE PYELOGRAM, RIGHT URETEROSCOPY WITH BIOPSY AND RIGHT STENT PLACEMENT (N/A)  Patient Location: PACU  Anesthesia Type: General  Level of Consciousness: awake, oriented, sedated and patient cooperative  Airway & Oxygen Therapy: Patient Spontanous Breathing and Patient connected to face mask oxygen  Post-op Assessment: Report given to PACU RN and Post -op Vital signs reviewed and stable  Post vital signs: Reviewed and stable  Complications: No apparent anesthesia complications  Last Vitals:  Vitals Value Taken Time  BP 157/88 01/20/20 1606  Temp 36.7 C 01/20/20 1606  Pulse 86 01/20/20 1611  Resp 15 01/20/20 1611  SpO2 98 % 01/20/20 1611  Vitals shown include unvalidated device data.  Last Pain:  Vitals:   01/20/20 1231  TempSrc: Oral  PainSc: 1       Patients Stated Pain Goal: 6 (01/20/20 1231)

## 2020-01-21 NOTE — Op Note (Signed)
NAMECORD, STRAUGHAN MEDICAL RECORD [id number] ACCOUNT [id number] DATE OF BIRTH:1946-07-09 FACILITY: [facility] LOCATION: [facility] PHYSICIAN:Kc Summerson, MD  OPERATIVE REPORT  DATE OF PROCEDURE:  01/20/2020  PREOPERATIVE DIAGNOSIS:  Right distal ureteral mass.  POSTOPERATIVE DIAGNOSIS:  Right distal ureteral cancer.  PROCEDURE: 1.  Cystoscopy, bilateral pyelograms interpretation. 2.  Right ureteroscopy with biopsy. 3.  Right ureteral stent placement 5 x 26 Polaris, no tether.  ESTIMATED BLOOD LOSS:  Nil.  MEDICATIONS:  None.  SPECIMEN:  Right distal ureteral mass for pathology.  FINDINGS: 1.  Unremarkable urinary bladder. 2.  Unremarkable left retrograde pyelogram. 3.  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  Photodocumentation performed. 4.  Successful placement of right ureteral stent proximal renal pelvis, distal end in urinary bladder.  INDICATIONS:  The patient is a pleasant 74 year old man who was found on workup of episodic gross hematuria to have a distal right ureteral mass highly concerning for cancer, no obvious locally advanced disease or metastatic disease noted.  Options were  discussed including recommended path of operative cystoscopy with biopsy and ureteroscopy.  He wished to proceed.  Informed consent was then placed in medical record.  DESCRIPTION OF PROCEDURE:  The patient identified, procedure of right ureteroscopy with biopsy, bilateral retrograde pyelograms was confirmed. A timeout was performed.  Intravenous administered.  General LMA anesthesia induced.  The patient was placed into a low lithotomy position, sterile field was created prepped and draped his penis, perineum and proximal thighs using iodine.   Cystourethroscopy was then performed using 21-French rigid cystoscope vessel lens with offset lens placed anterior and  posterior urethra was normal.  Urinary bladder ____ no diverticula, calcifications, papillary lesions.  Ureteral orifices were  singleton on the left, ureteral orifice was cannulated with a [place] renal catheter and left retrograde pyelogram was obtained.  Left retrograde pyelogram demonstrated a single left ureter single system left kidney.  No filling defects or narrowing noted.  Similarly, right pyelogram was obtained.  Right pyelogram does a single right ureter single system right kidney.  There was a filling defect in distal ureter estimated to be approximately 3-4 cm in length consistent with known mass, no obvious filling defects above this.  A 0.03 ZIPwire was very  carefully navigated above this level of the renal pelvis set aside as a safety wire.  An 8-French feeding tube was placed in the urinary bladder for pressure release, and semirigid ureteroscopy with distal right ureter alongside a separate sensor  working wire as expected in the right distal ureter.  There was multifocal large volume papillary tumor.  Further documentation performed.  Multiple biopsies were obtained using a reducible ureteroscopic grasper and escape basket and finally a BIGopsy  backloading apparatus with biopsy obtaining adequate samples of the tissue set aside for permanent pathology.  A sensor wire was advanced to level of the renal pelvis and the semirigid scope was exchanged for a single channel flexible digital  ureteroscope to the level of the renal pelvis and flexible digital ureteroscopy performed the right kidney, including all calices x3.  There are multifocal papillary changes that were quite subtle with multiple calices in the right renal pelvis.  This  therefore demonstrated multifocal disease including the urothelium of the kidney.  The ureteroscope was removed and again the other dominant focus of tumor being in the right distal ureter.  We achieved the goals of surgery today.  In terms of diagnostic  aspect, the decision was made to proceed with stenting.  The patient had some hydronephrosis on the right side.  As such, a new 5 x 26 Polaris-type stent was placed using cystoscopic and fluoroscopic guidance.  Good proximal coil were noted.  Bladder  was entered per cystoscope.  Procedure terminated.  The patient tolerated the procedure well.  No immediate complications.  The patient taken to the postanesthesia care unit in stable condition.  Plan for discharge home.  PN/NUANCE  D:01/20/2020 T:01/21/2020 JOB:[id number]

## 2020-01-21 NOTE — Anesthesia Postprocedure Evaluation (Signed)
Anesthesia Post Note  Patient: Jeovani Aebersold  Procedure(s) Performed: CYSTOSCOPY WITH BILATERAL RETROGRADE PYELOGRAM, RIGHT URETEROSCOPY WITH BIOPSY AND RIGHT STENT PLACEMENT (N/A )     Patient location during evaluation: PACU Anesthesia Type: General Level of consciousness: awake and alert Pain management: pain level controlled Vital Signs Assessment: post-procedure vital signs reviewed and stable Respiratory status: spontaneous breathing, nonlabored ventilation, respiratory function stable and patient connected to nasal cannula oxygen Cardiovascular status: blood pressure returned to baseline and stable Postop Assessment: no apparent nausea or vomiting Anesthetic complications: no    Last Vitals:  Vitals:   01/20/20 1645 01/20/20 1730  BP: (!) 155/75 (!) 180/90  Pulse: 80 80  Resp: 17 17  Temp:  36.8 C  SpO2: 98% 100%    Last Pain:  Vitals:   01/20/20 1750  TempSrc:   PainSc: 0-No pain                 Naaman Curro DAVID

## 2020-01-23 DIAGNOSIS — E1169 Type 2 diabetes mellitus with other specified complication: Secondary | ICD-10-CM | POA: Diagnosis not present

## 2020-01-23 DIAGNOSIS — K219 Gastro-esophageal reflux disease without esophagitis: Secondary | ICD-10-CM | POA: Diagnosis not present

## 2020-01-23 DIAGNOSIS — D763 Other histiocytosis syndromes: Secondary | ICD-10-CM | POA: Diagnosis not present

## 2020-01-23 DIAGNOSIS — I10 Essential (primary) hypertension: Secondary | ICD-10-CM | POA: Diagnosis not present

## 2020-01-23 DIAGNOSIS — E782 Mixed hyperlipidemia: Secondary | ICD-10-CM | POA: Diagnosis not present

## 2020-01-23 DIAGNOSIS — E1165 Type 2 diabetes mellitus with hyperglycemia: Secondary | ICD-10-CM | POA: Diagnosis not present

## 2020-01-23 LAB — SURGICAL PATHOLOGY

## 2020-01-25 ENCOUNTER — Other Ambulatory Visit: Payer: Self-pay | Admitting: Urology

## 2020-01-25 DIAGNOSIS — R330 Drug induced retention of urine: Secondary | ICD-10-CM | POA: Diagnosis not present

## 2020-01-26 DIAGNOSIS — Z6824 Body mass index (BMI) 24.0-24.9, adult: Secondary | ICD-10-CM | POA: Diagnosis not present

## 2020-01-26 DIAGNOSIS — D509 Iron deficiency anemia, unspecified: Secondary | ICD-10-CM | POA: Diagnosis not present

## 2020-01-26 DIAGNOSIS — E1169 Type 2 diabetes mellitus with other specified complication: Secondary | ICD-10-CM | POA: Diagnosis not present

## 2020-01-26 DIAGNOSIS — N471 Phimosis: Secondary | ICD-10-CM | POA: Diagnosis not present

## 2020-01-26 DIAGNOSIS — E782 Mixed hyperlipidemia: Secondary | ICD-10-CM | POA: Diagnosis not present

## 2020-01-26 DIAGNOSIS — R339 Retention of urine, unspecified: Secondary | ICD-10-CM | POA: Diagnosis not present

## 2020-01-26 DIAGNOSIS — I10 Essential (primary) hypertension: Secondary | ICD-10-CM | POA: Diagnosis not present

## 2020-01-26 DIAGNOSIS — C661 Malignant neoplasm of right ureter: Secondary | ICD-10-CM | POA: Diagnosis not present

## 2020-02-01 DIAGNOSIS — R310 Gross hematuria: Secondary | ICD-10-CM | POA: Diagnosis not present

## 2020-02-01 DIAGNOSIS — R338 Other retention of urine: Secondary | ICD-10-CM | POA: Diagnosis not present

## 2020-02-02 NOTE — Patient Instructions (Addendum)
DUE TO COVID-19 ONLY ONE VISITOR IS ALLOWED TO COME WITH YOU AND STAY IN THE WAITING ROOM ONLY DURING PRE OP AND PROCEDURE DAY OF SURGERY. THE 1 VISITOR MAY VISIT WITH YOU AFTER SURGERY IN YOUR PRIVATE ROOM DURING VISITING HOURS ONLY!  YOU NEED TO HAVE A COVID 19 TEST ON: 02/07/20 @ 11:00 am , THIS TEST MUST BE DONE BEFORE SURGERY, COME  [address], [place] , [address].  ([name]) ONCE YOUR COVID TEST IS COMPLETED, PLEASE BEGIN THE QUARANTINE INSTRUCTIONS AS OUTLINED IN YOUR HANDOUT.                Hector Lopez    Your procedure is scheduled on: 02/10/20   Report to [facility] Main  Entrance   Report to admitting at: 8:45 am AM     Call this number if you have problems the morning of surgery [phone]    Remember: Do not eat solid food  :After Midnight. Clear liquid diet from midnight until 7:45 am.    CLEAR LIQUID DIET   Foods Allowed                                                                     Foods Excluded  Coffee and tea, regular and decaf                             liquids that you cannot  Plain Jell-O any favor except red or purple                                           see through such as: Fruit ices (not with fruit pulp)                                     milk, soups, orange juice  Iced Popsicles                                    All solid food Carbonated beverages, regular and diet                                    Cranberry, grape and apple juices Sports drinks like Gatorade Lightly seasoned clear broth or consume(fat free) Sugar, honey syrup  Sample Menu Breakfast                                Lunch                                     Supper Cranberry juice                    Beef broth  Chicken broth Jell-O                                     Grape juice                           Apple juice Coffee or tea                        Jell-O                                      Popsicle                                                 Coffee or tea                        Coffee or tea  _____________________________________________________________________  BRUSH YOUR TEETH MORNING OF SURGERY AND RINSE YOUR MOUTH OUT, NO CHEWING GUM CANDY OR MINTS.     Take these medicines the morning of surgery with A SIP OF WATER: amlodipine,cetirizine,sertraline.  How to Manage Your Diabetes Before and After Surgery  Why is it important to control my blood sugar before and after surgery? . Improving blood sugar levels before and after surgery helps healing and can limit problems. . A way of improving blood sugar control is eating a healthy diet by: o  Eating less sugar and carbohydrates o  Increasing activity/exercise o  Talking with your doctor about reaching your blood sugar goals . High blood sugars (greater than 180 mg/dL) can raise your risk of infections and slow your recovery, so you will need to focus on controlling your diabetes during the weeks before surgery. . Make sure that the doctor who takes care of your diabetes knows about your planned surgery including the date and location.  How do I manage my blood sugar before surgery? . Check your blood sugar at least 4 times a day, starting 2 days before surgery, to make sure that the level is not too high or low. o Check your blood sugar the morning of your surgery when you wake up and every 2 hours until you get to the Short Stay unit. . If your blood sugar is less than 70 mg/dL, you will need to treat for low blood sugar: o Do not take insulin. o Treat a low blood sugar (less than 70 mg/dL) with  cup of clear juice (cranberry or apple), 4 glucose tablets, OR glucose gel. o Recheck blood sugar in 15 minutes after treatment (to make sure it is greater than 70 mg/dL). If your blood sugar is not greater than 70 mg/dL on recheck, call [phone] for further instructions. . Report your blood sugar to the short stay nurse when you get to  Short Stay.  . If you are admitted to the hospital after surgery: o Your blood sugar will be checked by the staff and you will probably be given insulin after surgery (instead of oral diabetes medicines) to make sure you have good blood sugar levels. o The goal for blood sugar control after surgery is 80-180 mg/dL.  WHAT DO I DO ABOUT MY DIABETES MEDICATION?  Marland Kitchen Do not take oral diabetes medicines (pills) the morning of surgery.  . THE DAY BEFORE SURGERY, take Metformin and Glipizide as usual.       . THE MORNING OF SURGERY, Do not take Metformin or Glipizide.                                You may not have any metal on your body including hair pins and              piercings  Do not wear jewelry,lotions, powders or perfumes, deodorant             Men may shave face and neck.   Do not bring valuables to the hospital. [place].  Contacts, dentures or bridgework may not be worn into surgery.  Leave suitcase in the car. After surgery it may be brought to your room.     Patients discharged the day of surgery will not be allowed to drive home. IF YOU ARE HAVING SURGERY AND GOING HOME THE SAME DAY, YOU MUST HAVE AN ADULT TO DRIVE YOU HOME AND BE WITH YOU FOR 24 HOURS. YOU MAY GO HOME BY TAXI OR UBER OR ORTHERWISE, BUT AN ADULT MUST ACCOMPANY YOU HOME AND STAY WITH YOU FOR 24 HOURS.  Name and phone number of your driver:  Special Instructions: N/A              Please read over the following fact sheets you were given:          [facility] - Preparing for Surgery Before surgery, you can play an important role.  Because skin is not sterile, your skin needs to be as free of germs as possible.  You can reduce the number of germs on your skin by washing with CHG (chlorahexidine gluconate) soap before surgery.  CHG is an antiseptic cleaner which kills germs and bonds with the skin to continue killing germs even after washing. Please DO NOT use  if you have an allergy to CHG or antibacterial soaps.  If your skin becomes reddened/irritated stop using the CHG and inform your nurse when you arrive at Short Stay. Do not shave (including legs and underarms) for at least 48 hours prior to the first CHG shower.  You may shave your face/neck. Please follow these instructions carefully:  1.  Shower with CHG Soap the night before surgery and the  morning of Surgery.  2.  If you choose to wash your hair, wash your hair first as usual with your  normal  shampoo.  3.  After you shampoo, rinse your hair and body thoroughly to remove the  shampoo.                           4.  Use CHG as you would any other liquid soap.  You can apply chg directly  to the skin and wash                       Gently with a scrungie or clean washcloth.  5.  Apply the CHG Soap to your body ONLY FROM THE NECK DOWN.   Do not use on face/ open  Wound or open sores. Avoid contact with eyes, ears mouth and genitals (private parts).                       Wash face,  Genitals (private parts) with your normal soap.             6.  Wash thoroughly, paying special attention to the area where your surgery  will be performed.  7.  Thoroughly rinse your body with warm water from the neck down.  8.  DO NOT shower/wash with your normal soap after using and rinsing off  the CHG Soap.                9.  Pat yourself dry with a clean towel.            10.  Wear clean pajamas.            11.  Place clean sheets on your bed the night of your first shower and do not  sleep with pets. Day of Surgery : Do not apply any lotions/deodorants the morning of surgery.  Please wear clean clothes to the hospital/surgery center.  FAILURE TO FOLLOW THESE INSTRUCTIONS MAY RESULT IN THE CANCELLATION OF YOUR SURGERY PATIENT SIGNATURE_________________________________  NURSE  SIGNATURE__________________________________  ________________________________________________________________________

## 2020-02-03 ENCOUNTER — Encounter (HOSPITAL_COMMUNITY): Payer: Self-pay

## 2020-02-03 ENCOUNTER — Encounter (HOSPITAL_COMMUNITY)
Admission: RE | Admit: 2020-02-03 | Discharge: 2020-02-03 | Disposition: A | Discharge status: Home / Self Care | Payer: Medicare Other | Source: Ambulatory Visit | Attending: Urology | Admitting: Urology

## 2020-02-03 ENCOUNTER — Other Ambulatory Visit: Payer: Self-pay

## 2020-02-03 DIAGNOSIS — Z01812 Encounter for preprocedural laboratory examination: Secondary | ICD-10-CM | POA: Insufficient documentation

## 2020-02-03 LAB — CBC
HCT: 38 % — ABNORMAL LOW (ref 39.0–52.0)
Hemoglobin: 12.5 g/dL — ABNORMAL LOW (ref 13.0–17.0)
MCH: 29.9 pg (ref 26.0–34.0)
MCHC: 32.9 g/dL (ref 30.0–36.0)
MCV: 90.9 fL (ref 80.0–100.0)
Platelets: 225 10*3/uL (ref 150–400)
RBC: 4.18 MIL/uL — ABNORMAL LOW (ref 4.22–5.81)
RDW: 12.9 % (ref 11.5–15.5)
WBC: 8.5 10*3/uL (ref 4.0–10.5)
nRBC: 0 % (ref 0.0–0.2)

## 2020-02-03 LAB — BASIC METABOLIC PANEL
Anion gap: 8 (ref 5–15)
BUN: 13 mg/dL (ref 8–23)
CO2: 29 mmol/L (ref 22–32)
Calcium: 9.2 mg/dL (ref 8.9–10.3)
Chloride: 104 mmol/L (ref 98–111)
Creatinine, Ser: 0.76 mg/dL (ref 0.61–1.24)
GFR calc Af Amer: >60 mL/min (ref >60)
GFR calc non Af Amer: >60 mL/min (ref >60)
Glucose, Bld: 230 mg/dL — ABNORMAL HIGH (ref 70–99)
Potassium: 3.8 mmol/L (ref 3.5–5.1)
Sodium: 141 mmol/L (ref 135–145)

## 2020-02-03 LAB — HEMOGLOBIN A1C
Hgb A1c MFr Bld: 8.2 % — ABNORMAL HIGH (ref 4.8–5.6)
Mean Plasma Glucose: 188.64 mg/dL

## 2020-02-03 LAB — GLUCOSE, CAPILLARY: Glucose-Capillary: 250 mg/dL — ABNORMAL HIGH (ref 70–99)

## 2020-02-03 NOTE — Progress Notes (Signed)
PCP - Dr. Judd Lien. LOV: 01/2020 Cardiologist -   Chest x-ray -  EKG - 12/09/19 EPIC Stress Test -  ECHO -  Cardiac Cath -   Sleep Study -  CPAP -   Fasting Blood Sugar -  Checks Blood Sugar   1  times a day  Blood Thinner Instructions: Aspirin Instructions: Last Dose:  Anesthesia review:   Patient denies shortness of breath, fever, cough and chest pain at PAT appointment   Patient verbalized understanding of instructions that were given to them at the PAT appointment. Patient was also instructed that they will need to review over the PAT instructions again at home before surgery.

## 2020-02-06 DIAGNOSIS — C651 Malignant neoplasm of right renal pelvis: Secondary | ICD-10-CM | POA: Diagnosis not present

## 2020-02-06 DIAGNOSIS — R338 Other retention of urine: Secondary | ICD-10-CM | POA: Diagnosis not present

## 2020-02-06 DIAGNOSIS — R310 Gross hematuria: Secondary | ICD-10-CM | POA: Diagnosis not present

## 2020-02-06 DIAGNOSIS — N471 Phimosis: Secondary | ICD-10-CM | POA: Diagnosis not present

## 2020-02-06 NOTE — Progress Notes (Signed)
Lab results: A1C: 8.2

## 2020-02-07 ENCOUNTER — Other Ambulatory Visit (HOSPITAL_COMMUNITY)
Admission: RE | Admit: 2020-02-07 | Discharge: 2020-02-07 | Disposition: A | Discharge status: Home / Self Care | Payer: Medicare Other | Source: Ambulatory Visit | Attending: Urology | Admitting: Urology

## 2020-02-07 DIAGNOSIS — Z01812 Encounter for preprocedural laboratory examination: Secondary | ICD-10-CM | POA: Insufficient documentation

## 2020-02-07 DIAGNOSIS — Z20822 Contact with and (suspected) exposure to covid-19: Secondary | ICD-10-CM | POA: Insufficient documentation

## 2020-02-07 LAB — SARS CORONAVIRUS 2 (TAT 6-24 HRS): SARS Coronavirus 2: NEGATIVE

## 2020-02-10 ENCOUNTER — Inpatient Hospital Stay (HOSPITAL_COMMUNITY): Payer: Medicare Other | Admitting: Physician Assistant

## 2020-02-10 ENCOUNTER — Inpatient Hospital Stay (HOSPITAL_COMMUNITY): Payer: Medicare Other | Admitting: Anesthesiology

## 2020-02-10 ENCOUNTER — Encounter (HOSPITAL_COMMUNITY): Payer: Self-pay | Admitting: Urology

## 2020-02-10 ENCOUNTER — Inpatient Hospital Stay (HOSPITAL_COMMUNITY)
Admission: RE | Admit: 2020-02-10 | Discharge: 2020-02-12 | DRG: 657 | Disposition: A | Discharge status: Home / Self Care | Payer: Medicare Other | Attending: Urology | Admitting: Urology

## 2020-02-10 ENCOUNTER — Encounter (HOSPITAL_COMMUNITY)
Admission: RE | Disposition: A | Discharge status: Home / Self Care | Payer: Self-pay | Source: Ambulatory Visit | Attending: Urology

## 2020-02-10 ENCOUNTER — Other Ambulatory Visit: Payer: Self-pay

## 2020-02-10 DIAGNOSIS — M545 Low back pain: Secondary | ICD-10-CM | POA: Diagnosis present

## 2020-02-10 DIAGNOSIS — E782 Mixed hyperlipidemia: Secondary | ICD-10-CM | POA: Diagnosis present

## 2020-02-10 DIAGNOSIS — E119 Type 2 diabetes mellitus without complications: Secondary | ICD-10-CM | POA: Diagnosis present

## 2020-02-10 DIAGNOSIS — I10 Essential (primary) hypertension: Secondary | ICD-10-CM | POA: Diagnosis present

## 2020-02-10 DIAGNOSIS — R319 Hematuria, unspecified: Secondary | ICD-10-CM | POA: Diagnosis present

## 2020-02-10 DIAGNOSIS — D649 Anemia, unspecified: Secondary | ICD-10-CM | POA: Diagnosis present

## 2020-02-10 DIAGNOSIS — C7919 Secondary malignant neoplasm of other urinary organs: Principal | ICD-10-CM | POA: Diagnosis present

## 2020-02-10 DIAGNOSIS — K219 Gastro-esophageal reflux disease without esophagitis: Secondary | ICD-10-CM | POA: Diagnosis present

## 2020-02-10 DIAGNOSIS — C775 Secondary and unspecified malignant neoplasm of intrapelvic lymph nodes: Secondary | ICD-10-CM | POA: Diagnosis present

## 2020-02-10 DIAGNOSIS — G8929 Other chronic pain: Secondary | ICD-10-CM | POA: Diagnosis present

## 2020-02-10 DIAGNOSIS — N131 Hydronephrosis with ureteral stricture, not elsewhere classified: Secondary | ICD-10-CM | POA: Diagnosis present

## 2020-02-10 DIAGNOSIS — C651 Malignant neoplasm of right renal pelvis: Secondary | ICD-10-CM | POA: Diagnosis not present

## 2020-02-10 DIAGNOSIS — C642 Malignant neoplasm of left kidney, except renal pelvis: Secondary | ICD-10-CM | POA: Diagnosis present

## 2020-02-10 DIAGNOSIS — Z20822 Contact with and (suspected) exposure to covid-19: Secondary | ICD-10-CM | POA: Diagnosis present

## 2020-02-10 DIAGNOSIS — Z885 Allergy status to narcotic agent status: Secondary | ICD-10-CM | POA: Diagnosis not present

## 2020-02-10 DIAGNOSIS — C61 Malignant neoplasm of prostate: Secondary | ICD-10-CM | POA: Diagnosis not present

## 2020-02-10 DIAGNOSIS — C662 Malignant neoplasm of left ureter: Secondary | ICD-10-CM | POA: Diagnosis present

## 2020-02-10 DIAGNOSIS — C661 Malignant neoplasm of right ureter: Secondary | ICD-10-CM | POA: Diagnosis not present

## 2020-02-10 DIAGNOSIS — D4959 Neoplasm of unspecified behavior of other genitourinary organ: Secondary | ICD-10-CM

## 2020-02-10 HISTORY — PX: ROBOT ASSITED LAPAROSCOPIC NEPHROURETERECTOMY: SHX6077

## 2020-02-10 HISTORY — PX: PELVIC LYMPH NODE DISSECTION: SHX6543

## 2020-02-10 HISTORY — PX: CYSTOSCOPY: SHX5120

## 2020-02-10 LAB — HEMOGLOBIN AND HEMATOCRIT, BLOOD
HCT: 37.2 % — ABNORMAL LOW (ref 39.0–52.0)
Hemoglobin: 12.1 g/dL — ABNORMAL LOW (ref 13.0–17.0)

## 2020-02-10 LAB — GLUCOSE, CAPILLARY
Glucose-Capillary: 218 mg/dL — ABNORMAL HIGH (ref 70–99)
Glucose-Capillary: 289 mg/dL — ABNORMAL HIGH (ref 70–99)
Glucose-Capillary: 246 mg/dL — ABNORMAL HIGH (ref 70–99)
Glucose-Capillary: 219 mg/dL — ABNORMAL HIGH (ref 70–99)

## 2020-02-10 LAB — SAMPLE TO BLOOD BANK

## 2020-02-10 SURGERY — NEPHROURETERECTOMY, ROBOT-ASSISTED, LAPAROSCOPIC
Anesthesia: General | Laterality: Right

## 2020-02-10 MED ORDER — SIMVASTATIN 20 MG PO TABS: 20.0000 mg | ORAL_TABLET | Freq: Every day | ORAL | Status: DC

## 2020-02-10 MED ORDER — LACTATED RINGERS IV SOLN: INTRAVENOUS | Status: DC

## 2020-02-10 MED ORDER — SODIUM CHLORIDE (PF) 0.9 % IJ SOLN
INTRAMUSCULAR | Status: AC
  Filled 2020-02-10: qty 20

## 2020-02-10 MED ORDER — FENTANYL CITRATE (PF) 100 MCG/2ML IJ SOLN: 25.0000 ug | INTRAMUSCULAR | Status: DC | PRN

## 2020-02-10 MED ORDER — PHENYLEPHRINE 40 MCG/ML (10ML) SYRINGE FOR IV PUSH (FOR BLOOD PRESSURE SUPPORT)
PREFILLED_SYRINGE | INTRAVENOUS | Status: DC | PRN
  Administered 2020-02-10 (×4): 80 ug via INTRAVENOUS

## 2020-02-10 MED ORDER — ONDANSETRON HCL 4 MG/2ML IJ SOLN
INTRAMUSCULAR | Status: DC | PRN
  Administered 2020-02-10: 4 mg via INTRAVENOUS

## 2020-02-10 MED ORDER — HYDROMORPHONE HCL 1 MG/ML IJ SOLN
INTRAMUSCULAR | Status: DC | PRN
  Administered 2020-02-10: 1 mg via INTRAVENOUS

## 2020-02-10 MED ORDER — SODIUM CHLORIDE 0.45 % IV SOLN: INTRAVENOUS | Status: DC

## 2020-02-10 MED ORDER — SODIUM CHLORIDE (PF) 0.9 % IJ SOLN
INTRAMUSCULAR | Status: DC | PRN
  Administered 2020-02-10: 20 mL

## 2020-02-10 MED ORDER — INSULIN ASPART 100 UNIT/ML ~~LOC~~ SOLN
0.0000 [IU] | Freq: Three times a day (TID) | SUBCUTANEOUS | Status: DC
  Administered 2020-02-10 – 2020-02-11 (×2): 5 [IU] via SUBCUTANEOUS
  Administered 2020-02-11: 3 [IU] via SUBCUTANEOUS
  Administered 2020-02-11: 5 [IU] via SUBCUTANEOUS
  Administered 2020-02-12 (×2): 3 [IU] via SUBCUTANEOUS

## 2020-02-10 MED ORDER — ATORVASTATIN CALCIUM 10 MG PO TABS
10.0000 mg | ORAL_TABLET | Freq: Every day | ORAL | Status: DC
  Administered 2020-02-11 – 2020-02-12 (×2): 10 mg via ORAL
  Filled 2020-02-10 (×2): qty 1

## 2020-02-10 MED ORDER — BUPIVACAINE LIPOSOME 1.3 % IJ SUSP
20.0000 mL | Freq: Once | INTRAMUSCULAR | Status: AC
  Administered 2020-02-10: 20 mL
  Filled 2020-02-10: qty 20

## 2020-02-10 MED ORDER — FENTANYL CITRATE (PF) 100 MCG/2ML IJ SOLN
INTRAMUSCULAR | Status: DC | PRN
  Administered 2020-02-10 (×2): 100 ug via INTRAVENOUS

## 2020-02-10 MED ORDER — DIPHENHYDRAMINE HCL 50 MG/ML IJ SOLN: 12.5000 mg | Freq: Four times a day (QID) | INTRAMUSCULAR | Status: DC | PRN

## 2020-02-10 MED ORDER — INSULIN ASPART 100 UNIT/ML ~~LOC~~ SOLN
8.0000 [IU] | Freq: Once | SUBCUTANEOUS | Status: AC
  Administered 2020-02-10: 8 [IU] via SUBCUTANEOUS

## 2020-02-10 MED ORDER — CHLORHEXIDINE GLUCONATE 0.12 % MT SOLN
15.0000 mL | Freq: Once | OROMUCOSAL | Status: AC
  Administered 2020-02-10: 15 mL via OROMUCOSAL

## 2020-02-10 MED ORDER — DEXAMETHASONE SODIUM PHOSPHATE 10 MG/ML IJ SOLN
INTRAMUSCULAR | Status: DC | PRN
  Administered 2020-02-10: 4 mg via INTRAVENOUS

## 2020-02-10 MED ORDER — OXYCODONE HCL 5 MG PO TABS: 5.0000 mg | ORAL_TABLET | Freq: Once | ORAL | Status: DC | PRN

## 2020-02-10 MED ORDER — LIDOCAINE 2% (20 MG/ML) 5 ML SYRINGE
INTRAMUSCULAR | Status: DC | PRN
  Administered 2020-02-10: 60 mg via INTRAVENOUS
  Administered 2020-02-10: 40 mg via INTRAVENOUS

## 2020-02-10 MED ORDER — LIDOCAINE 2% (20 MG/ML) 5 ML SYRINGE
INTRAMUSCULAR | Status: AC
  Filled 2020-02-10: qty 5

## 2020-02-10 MED ORDER — PROPOFOL 10 MG/ML IV BOLUS
INTRAVENOUS | Status: AC
  Filled 2020-02-10: qty 20

## 2020-02-10 MED ORDER — STERILE WATER FOR IRRIGATION IR SOLN
Status: DC | PRN
  Administered 2020-02-10: 1000 mL

## 2020-02-10 MED ORDER — OXYCODONE-ACETAMINOPHEN 5-325 MG PO TABS: 1.0000 | ORAL_TABLET | Freq: Four times a day (QID) | ORAL | 0 refills | Status: DC | PRN

## 2020-02-10 MED ORDER — ROCURONIUM BROMIDE 10 MG/ML (PF) SYRINGE
PREFILLED_SYRINGE | INTRAVENOUS | Status: AC
  Filled 2020-02-10: qty 20

## 2020-02-10 MED ORDER — FENTANYL CITRATE (PF) 100 MCG/2ML IJ SOLN
INTRAMUSCULAR | Status: AC
  Administered 2020-02-10: 50 ug via INTRAVENOUS
  Filled 2020-02-10: qty 2

## 2020-02-10 MED ORDER — SUGAMMADEX SODIUM 200 MG/2ML IV SOLN
INTRAVENOUS | Status: DC | PRN
  Administered 2020-02-10: 300 mg via INTRAVENOUS

## 2020-02-10 MED ORDER — LACTATED RINGERS IV SOLN: INTRAVENOUS | Status: DC | PRN

## 2020-02-10 MED ORDER — ROCURONIUM BROMIDE 10 MG/ML (PF) SYRINGE
PREFILLED_SYRINGE | INTRAVENOUS | Status: DC | PRN
  Administered 2020-02-10: 20 mg via INTRAVENOUS
  Administered 2020-02-10: 80 mg via INTRAVENOUS
  Administered 2020-02-10: 20 mg via INTRAVENOUS

## 2020-02-10 MED ORDER — CEFAZOLIN SODIUM-DEXTROSE 2-4 GM/100ML-% IV SOLN
2.0000 g | INTRAVENOUS | Status: AC
  Administered 2020-02-10: 2 g via INTRAVENOUS
  Filled 2020-02-10: qty 100

## 2020-02-10 MED ORDER — ORAL CARE MOUTH RINSE: 15.0000 mL | Freq: Once | OROMUCOSAL | Status: AC

## 2020-02-10 MED ORDER — PROPOFOL 10 MG/ML IV BOLUS
INTRAVENOUS | Status: DC | PRN
  Administered 2020-02-10: 150 mg via INTRAVENOUS

## 2020-02-10 MED ORDER — SERTRALINE HCL 50 MG PO TABS
50.0000 mg | ORAL_TABLET | Freq: Every day | ORAL | Status: DC
  Administered 2020-02-10 – 2020-02-12 (×3): 50 mg via ORAL
  Filled 2020-02-10 (×3): qty 1

## 2020-02-10 MED ORDER — OXYCODONE HCL 5 MG PO TABS
5.0000 mg | ORAL_TABLET | ORAL | Status: DC | PRN
  Administered 2020-02-11 – 2020-02-12 (×5): 5 mg via ORAL
  Filled 2020-02-10 (×5): qty 1

## 2020-02-10 MED ORDER — ACETAMINOPHEN 500 MG PO TABS
1000.0000 mg | ORAL_TABLET | Freq: Four times a day (QID) | ORAL | Status: AC
  Administered 2020-02-10 – 2020-02-11 (×4): 1000 mg via ORAL
  Filled 2020-02-10 (×4): qty 2

## 2020-02-10 MED ORDER — PHENYLEPHRINE 40 MCG/ML (10ML) SYRINGE FOR IV PUSH (FOR BLOOD PRESSURE SUPPORT)
PREFILLED_SYRINGE | INTRAVENOUS | Status: AC
  Filled 2020-02-10: qty 10

## 2020-02-10 MED ORDER — AMLODIPINE BESYLATE 10 MG PO TABS
10.0000 mg | ORAL_TABLET | Freq: Every day | ORAL | Status: DC
  Administered 2020-02-10 – 2020-02-11 (×2): 10 mg via ORAL
  Filled 2020-02-10 (×2): qty 1

## 2020-02-10 MED ORDER — BELLADONNA ALKALOIDS-OPIUM 16.2-60 MG RE SUPP: 1.0000 | Freq: Four times a day (QID) | RECTAL | Status: DC | PRN

## 2020-02-10 MED ORDER — ONDANSETRON HCL 4 MG/2ML IJ SOLN: 4.0000 mg | INTRAMUSCULAR | Status: DC | PRN

## 2020-02-10 MED ORDER — FENTANYL CITRATE (PF) 250 MCG/5ML IJ SOLN
INTRAMUSCULAR | Status: AC
  Filled 2020-02-10: qty 5

## 2020-02-10 MED ORDER — OXYCODONE HCL 5 MG/5ML PO SOLN: 5.0000 mg | Freq: Once | ORAL | Status: DC | PRN

## 2020-02-10 MED ORDER — PIPERACILLIN-TAZOBACTAM 3.375 G IVPB
3.3750 g | Freq: Three times a day (TID) | INTRAVENOUS | Status: AC
  Administered 2020-02-10 – 2020-02-11 (×3): 3.375 g via INTRAVENOUS
  Filled 2020-02-10 (×3): qty 50

## 2020-02-10 MED ORDER — ONDANSETRON HCL 4 MG/2ML IJ SOLN: 4.0000 mg | Freq: Once | INTRAMUSCULAR | Status: DC | PRN

## 2020-02-10 MED ORDER — MAGNESIUM CITRATE PO SOLN
1.0000 | Freq: Once | ORAL | Status: DC
  Filled 2020-02-10: qty 296

## 2020-02-10 MED ORDER — DOCUSATE SODIUM 100 MG PO CAPS
100.0000 mg | ORAL_CAPSULE | Freq: Two times a day (BID) | ORAL | Status: DC
  Administered 2020-02-10 – 2020-02-12 (×4): 100 mg via ORAL
  Filled 2020-02-10 (×4): qty 1

## 2020-02-10 MED ORDER — LACTATED RINGERS IR SOLN
Status: DC | PRN
  Administered 2020-02-10: 1000 mL

## 2020-02-10 MED ORDER — HYDROMORPHONE HCL 1 MG/ML IJ SOLN
0.5000 mg | INTRAMUSCULAR | Status: DC | PRN
  Administered 2020-02-10: 1 mg via INTRAVENOUS
  Filled 2020-02-10: qty 1

## 2020-02-10 MED ORDER — DIPHENHYDRAMINE HCL 12.5 MG/5ML PO ELIX: 12.5000 mg | ORAL_SOLUTION | Freq: Four times a day (QID) | ORAL | Status: DC | PRN

## 2020-02-10 MED ORDER — HYDROMORPHONE HCL 2 MG/ML IJ SOLN
INTRAMUSCULAR | Status: AC
  Filled 2020-02-10: qty 1

## 2020-02-10 MED ORDER — BACITRACIN-NEOMYCIN-POLYMYXIN 400-5-5000 EX OINT: 1.0000 "application " | TOPICAL_OINTMENT | Freq: Three times a day (TID) | CUTANEOUS | Status: DC | PRN

## 2020-02-10 MED ORDER — INSULIN ASPART 100 UNIT/ML ~~LOC~~ SOLN
SUBCUTANEOUS | Status: AC
  Filled 2020-02-10: qty 1

## 2020-02-10 SURGICAL SUPPLY — 61 items
BAG LAPAROSCOPIC 12 15 PORT 16 (BASKET) ×3 IMPLANT
BAG RETRIEVAL 12/15 (BASKET) ×4
BAG RETRIEVAL 12/15MM (BASKET) ×1
CHLORAPREP W/TINT 26 (MISCELLANEOUS) ×5 IMPLANT
CLIP VESOLOCK LG 6/CT PURPLE (CLIP) ×5 IMPLANT
CLIP VESOLOCK MED LG 6/CT (CLIP) ×5 IMPLANT
CLIP VESOLOCK XL 6/CT (CLIP) ×5 IMPLANT
COVER SURGICAL LIGHT HANDLE (MISCELLANEOUS) ×5 IMPLANT
COVER TIP SHEARS 8 DVNC (MISCELLANEOUS) ×3 IMPLANT
COVER TIP SHEARS 8MM DA VINCI (MISCELLANEOUS) ×2
COVER WAND RF STERILE (DRAPES) IMPLANT
CUTTER ECHEON FLEX ENDO 45 340 (ENDOMECHANICALS) ×5 IMPLANT
DECANTER SPIKE VIAL GLASS SM (MISCELLANEOUS) ×5 IMPLANT
DERMABOND ADVANCED (GAUZE/BANDAGES/DRESSINGS) ×2
DERMABOND ADVANCED .7 DNX12 (GAUZE/BANDAGES/DRESSINGS) ×3 IMPLANT
DRAIN CHANNEL 15F RND FF 3/16 (WOUND CARE) ×5 IMPLANT
DRAPE ARM DVNC X/XI (DISPOSABLE) ×12 IMPLANT
DRAPE COLUMN DVNC XI (DISPOSABLE) ×3 IMPLANT
DRAPE DA VINCI XI ARM (DISPOSABLE) ×8
DRAPE DA VINCI XI COLUMN (DISPOSABLE) ×2
DRAPE INCISE IOBAN 66X45 STRL (DRAPES) ×5 IMPLANT
DRAPE SHEET LG 3/4 BI-LAMINATE (DRAPES) ×5 IMPLANT
ELECT REM PT RETURN 15FT ADLT (MISCELLANEOUS) ×5 IMPLANT
EVACUATOR SILICONE 100CC (DRAIN) ×5 IMPLANT
GLOVE BIO SURGEON STRL SZ 6.5 (GLOVE) ×4 IMPLANT
GLOVE BIO SURGEONS STRL SZ 6.5 (GLOVE) ×1
GLOVE BIOGEL M STRL SZ7.5 (GLOVE) ×10 IMPLANT
GOWN STRL REUS W/TWL LRG LVL3 (GOWN DISPOSABLE) ×15 IMPLANT
IRRIG SUCT STRYKERFLOW 2 WTIP (MISCELLANEOUS) ×5
IRRIGATION SUCT STRKRFLW 2 WTP (MISCELLANEOUS) ×3 IMPLANT
KIT BASIN (CUSTOM PROCEDURE TRAY) ×5 IMPLANT
KIT TURNOVER KIT A (KITS) IMPLANT
LOOP VESSEL MAXI BLUE (MISCELLANEOUS) ×5 IMPLANT
MARKER SKIN DUAL TIP RULER LAB (MISCELLANEOUS) IMPLANT
NEEDLE INSUFFLATION 14GA 120MM (NEEDLE) ×5 IMPLANT
PENCIL SMOKE EVACUATOR (MISCELLANEOUS) IMPLANT
PORT ACCESS TROCAR AIRSEAL 12 (TROCAR) ×3 IMPLANT
PORT ACCESS TROCAR AIRSEAL 5M (TROCAR) ×2
PROTECTOR NERVE ULNAR (MISCELLANEOUS) ×10 IMPLANT
SEAL CANN UNIV 5-8 DVNC XI (MISCELLANEOUS) ×12 IMPLANT
SEAL XI 5MM-8MM UNIVERSAL (MISCELLANEOUS) ×8
SET IRRIG Y TYPE TUR BLADDER L (SET/KITS/TRAYS/PACK) ×5 IMPLANT
SET TRI-LUMEN FLTR TB AIRSEAL (TUBING) ×5 IMPLANT
SOLUTION ELECTROLUBE (MISCELLANEOUS) ×5 IMPLANT
STAPLE RELOAD 45 WHT (STAPLE) ×15 IMPLANT
STAPLE RELOAD 45MM WHITE (STAPLE) ×10
SUT ETHILON 3 0 PS 1 (SUTURE) ×5 IMPLANT
SUT MNCRL AB 4-0 PS2 18 (SUTURE) ×15 IMPLANT
SUT PDS AB 1 CT1 27 (SUTURE) ×15 IMPLANT
SUT VIC AB 2-0 SH 27 (SUTURE) ×2
SUT VIC AB 2-0 SH 27X BRD (SUTURE) ×3 IMPLANT
SUT VLOC BARB 180 ABS3/0GR12 (SUTURE) ×10
SUTURE VLOC BRB 180 ABS3/0GR12 (SUTURE) ×6 IMPLANT
TOWEL OR 17X26 10 PK STRL BLUE (TOWEL DISPOSABLE) ×5 IMPLANT
TOWEL OR NON WOVEN STRL DISP B (DISPOSABLE) ×5 IMPLANT
TRAY FOLEY MTR SLVR 16FR STAT (SET/KITS/TRAYS/PACK) ×5 IMPLANT
TRAY LAPAROSCOPIC (CUSTOM PROCEDURE TRAY) ×5 IMPLANT
TROCAR BLADELESS OPT 5 100 (ENDOMECHANICALS) ×5 IMPLANT
TROCAR UNIVERSAL OPT 12M 100M (ENDOMECHANICALS) ×5 IMPLANT
TROCAR XCEL 12X100 BLDLESS (ENDOMECHANICALS) ×5 IMPLANT
WATER STERILE IRR 1000ML POUR (IV SOLUTION) ×5 IMPLANT

## 2020-02-10 NOTE — Transfer of Care (Signed)
Immediate Anesthesia Transfer of Care Note  Patient: Hector Lopez  Procedure(s) Performed: XI ROBOT ASSITED LAPAROSCOPIC NEPHROURETERECTOMY (Right ) CYSTOSCOPY (N/A ) PELVIC LYMPH NODE  and RETROPERITONEAL LYMPH NODE DISSECTION  Patient Location: PACU  Anesthesia Type:General  Level of Consciousness: drowsy  Airway & Oxygen Therapy: Patient Spontanous Breathing and Patient connected to face mask  Post-op Assessment: Report given to RN and Post -op Vital signs reviewed and stable  Post vital signs: Reviewed and stable  Last Vitals:  Vitals Value Taken Time  BP    Temp    Pulse 83 02/10/20 1441  Resp 12 02/10/20 1441  SpO2 98 % 02/10/20 1441  Vitals shown include unvalidated device data.  Last Pain:  Vitals:   02/10/20 0923  TempSrc:   PainSc: 0-No pain         Complications: No apparent anesthesia complications
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
## 2020-02-10 NOTE — Op Note (Signed)
NAMEFITZPATRICK, Hector MEDICAL RECORD [id number] ACCOUNT [id number] DATE OF BIRTH:01-30-1946 FACILITY: [facility] LOCATION: [facility] PHYSICIAN:Leeona Mccardle Tresa Moore, MD  OPERATIVE REPORT  DATE OF PROCEDURE:  02/10/2020  SURGEON:  Alexis Frock, MD  PREOPERATIVE DIAGNOSES:   1.  Right ureteral and renal pelvis cancer. 2.  Paracaval and pelvic adenopathy, borderline.  PROCEDURES: 1.  Cystoscopy. 2.  Robotic-assisted laparoscopic right nephroureterectomy. 3.  Robotic right retroperitoneal lymph node dissection. 4.  Pelvic lymph node dissection, robotic.  ESTIMATED BLOOD LOSS:  750 mL.  COMPLICATIONS:  None.  SPECIMEN: 1.  Right kidney plus ureter plus bladder cuff en bloc. 2.  Paracaval lymph nodes. 3.  Right common iliac lymph nodes.  DRAINS:  One Jackson-Pratt drain to bulb suction, transferred to straight drain.  FINDINGS: 1.  Moderate desmoplastic reaction around the kidney and ureter. 2.  Borderline adenopathy, right paracaval and also  right common iliac as anticipated on imaging. 3.  Complete removal of the in situ right ureteral stent. 4.  Single artery, single vein right renovascular anatomy as anticipated.  INDICATIONS:  The patient is a 74 year old man who was found on recent workup of gross hematuria to have an enhancing distal ureteral mass and hydronephrosis.  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  He has some borderline lymphadenopathy, mostly in the paracaval region and right pelvic lymph node region.  He has had ongoing  hematuria and hydronephrosis from obstruction.  Options were discussed for management, including recommended path of nephroureterectomy for diagnostic and therapeutic intent and he wished to proceed.  Informed consent was obtained and placed in the  medical record.  DESCRIPTION OF  PROCEDURE:  The patient being identified, the procedure being right nephroureterectomy with retroperitoneal dissection pelvis lymph node dissection was confirmed.  Procedure timeout was performed.  Intravenous antibiotics were  administered.  General endotracheal anesthesia induced.  The patient was placed into a right side up full flank position, pulling 15 degrees of table flexion, superior arm elevator, axillary roll, sequential devices, bottom leg bent, top leg straight.   He was further fastened to the operative table using 3-inch tape over foam padding across the supraxiphoid chest and his pelvis.  Foley catheter was exchanged sterilely.  Next, a high-flow, pressure pneumoperitoneum was obtained using Veress technique in  the right lower quadrant, having passed the aspiration and drop test.  An 8 mm robotic camera was then placed in position approximately 1 handbreadth superolateral to the umbilicus.  Laparoscopic examination of the peritoneal cavity revealed some loose  pericolonic adhesions and omental adhesions in the right hemi-abdomen.  No visceral injury or locally advanced disease.  Distal ports were placed as follows:  Right subcostal 8 mm robotic port, right midline subxiphoid 5 mm port through which a  self-locking grasper was used to elevate the inferior edge of the liver away from Gerota fascia, right far lateral 8 mm robotic port approximately 3 fingerbreadths superomedial to the anterior iliac spine and a right paramedian inferior robotic port  approximately 3 fingerbreadths above the pubic ramus and two 12 mm assistant port sites to midline, one 2 fingerbreadths below the umbilicus and another 2 fingerbreadths above the plane of the camera port.  Robot was docked and passed the electronic  checks.  Attention was directed at adhesiolysis of several loose adhesions of the transverse and ascending colon to the abdominal wall and attention was directed at  development of the retroperitoneum.   This was made lateral to the ascending colon from  the area of the cecum towards the area of the hepatic flexure.  This was carefully mobilized medially.  The duodenum was encountered and carefully kocherized medially such that it lay medial to the medial right to the lateral border of the inferior vena  cava.  Lower pole kidney area was identified, placed on gentle lateral traction.  Dissection proceeded medial to this.  The ureter and gonadal vessels were encountered.  The gonadal vessels were allowed to fall medially.  The ureter was placed on gentle  lateral traction.  Dissection proceeded in the triangle of the psoas muscle, inferior vena cava and ureter towards the area of the renal hilum.  Renal hilum consisted of single artery, single vein renal vascular anatomy was dissipated.  The vein was  somewhat broad based.  The artery was controlled using an extra-large Hem-o-Lok clip proximal, vascular stapler distal and the vein using vascular stapler.  A nonadrenal sparing plane was chosen given some concern of oligometastatic disease and vascular  stapler was used to separate the adrenal from the superior and medial attachments such that it remained en bloc with the kidney.  Lateral attachments were taken down using cautery scissors.  This completely circumferentially mobilized the kidney such  that it lay just on its ureteral stump, which was then marked with a vessel loop.  The ureter was then dissected distally towards the area of the pelvis.  The gonadal vessels were purposely transected and allowed to fall medially and laterally  respectively and the ureter was continued to be circumferentially mobilized past the iliac vessels towards the area of the true pelvis, keeping what appeared to be a rim of fibromuscular fatty tissue around the ureter in an effort to achieve negative  margins.  A deep pelvis incision was made lateral to the right medial umbilical ligament allowing this form a floor of  dissection towards the area of the ureterovesical junction, which was denoted by the presence of the superior vesicle artery.  A stay  stitch of 3-0 V-Loc was applied at the superomedial border 1 cm away from the superomedial aspect of the ureterovesical junction and cautery dissection was used circumferentially to excise the ureterovesical junction.  In this process, the in situ stent  was cut inadvertently such that the distal end remain within the true lumen of the bladder.  It was unable to be visualized.  This was performed after an extra-large Hem-o-Lok clip was placed across the mid lower ureter to prevent antegrade spillage of  tumor contents or dislodging of the stent.  This completed the nephroureterectomy portion of the procedure and the kidney plus ureter plus bladder cuff specimen was placed en bloc into an extra-large EndoCatch bag.  The small cystotomy was closed by  running the previous 2-0 V-Loc stay stitch inferolaterally over the small cystotomy in 2 layers, which revealed an excellent reapproximation and imbrication of the small cystotomy.  As the patient did have some borderline adenopathy, it was clearly felt  that retroperitoneal and pelvic dissection would be warranted for diagnostic and staging intent as well.  First, the area of the vena cava was inspected from the renal hilum towards the area of the bifurcation and an area of adenopathy was noted  approximately 3 cm in size in paracaval location.  Exquisite care dissection was performed in this area, dissecting the lymph node packet away from the inferior vena cava and the anterior  surface of the vertebral bodies.  There were several small lumbar  vessels that were uninterrupted.  Lymphostasis was carefully achieved with cold clips and this lymph node packet was set aside labeled as paracaval lymph nodes.  Along the course of the common iliac artery, there was a separate and borderline adenopathy  approximately 2-3 cm in diameter  as well.  These were carefully circumferentially mobilized away from the midportion of the iliac artery.  Lymphostasis was achieved with cold clips, set aside and labeled as right common iliac lymph nodes.  At this point,  sponge and needle counts were correct.  Hemostasis appeared excellent.  We achieved the goals of the extirpative portion of the surgery today.  A closed suction drain was brought out the previous lateral most robotic port site into the area of the  peritoneal cavity.  The liver retractor was taken down.  Pneumoperitoneum was resolved and specimen was retrieved by connecting the 2 assistant port sites in the midline, erring to the right side of the umbilicus and removing the bladder cuff plus ureter  plus right kidney with adrenal specimen and setting aside for permanent pathology.  Extraction site was closed at the fascia using figure-of-eight PDS x8, followed by reapproximation of Scarpa's with a running Vicryl.  All incision sites were  infiltrated at the skin using lipolyzed Marcaine and closed at the level skin using subcuticular Monocryl, followed by Dermabond.  The patient was repositioned into a supine position.  The in situ Foley catheter was removed and his penis was prepped  using iodine and flexible cystourethroscopy was performed using a 16-French flexible cystoscope.  Anterior and posterior urethra only revealed some bilobar prostatic hypertrophy as known.  Inspection of the bladder did reveal the prior distal end of the  stent free floating.  It was easily grasped and removed in entirety, inspected and corroborated removal of the entire prior right stent.  The right ureteral orifice was surgically absent as anticipated.  A new 16-French Foley catheter was placed free to  straight drain, 10 mL around the balloon.  The procedure was terminated.  The patient tolerated the procedure well.  No immediate perioperative complications.  The patient was taken to the postanesthesia care  unit in stable condition.  Plan for inpatient  admission.  Please note, first assistant Debbrah Alar was crucial for all portions of the surgery today.  She provided invaluable retraction, suctioning, specimen manipulation, vascular clipping, vascular stapling and general first assistance.  VN/NUANCE  D:02/10/2020 T:02/10/2020 JOB:[id number]

## 2020-02-10 NOTE — Discharge Instructions (Signed)

## 2020-02-10 NOTE — Brief Op Note (Signed)
02/10/2020  5:33 PM  PATIENT:  Hector Lopez  74 y.o. male  PRE-OPERATIVE DIAGNOSIS:  LEFT URETERAL AND RENAL CANCER  POST-OPERATIVE DIAGNOSIS:  LEFT URETERAL AND RENAL CANCER  PROCEDURE:  Procedure(s) with comments: XI ROBOT ASSITED LAPAROSCOPIC NEPHROURETERECTOMY (Right) - 3 HRS CYSTOSCOPY (N/A) PELVIC LYMPH NODE  and RETROPERITONEAL LYMPH NODE DISSECTION  SURGEON:  Surgeon(s) and Role:    Alexis Frock, MD - Primary  PHYSICIAN ASSISTANT:   ASSISTANTS: Debbrah Alar PA   ANESTHESIA:   regional and general  EBL:  150 mL   BLOOD ADMINISTERED:none  DRAINS: 1 - JP to bulb; 2 - Foley to gravity.    LOCAL MEDICATIONS USED:  MARCAINE     SPECIMEN:  Source of Specimen:  1 - Rt kidney + ureter + bladder cuff en bloc; 2 - Pericaval lymp node; 3 - Rt common illiac lymph node.   DISPOSITION OF SPECIMEN:  PATHOLOGY  COUNTS:  YES  TOURNIQUET:  * No tourniquets in log *  DICTATION: .Other Dictation: Dictation Number  [id number]  PLAN OF CARE: Admit to inpatient   PATIENT DISPOSITION:  PACU - hemodynamically stable.   Delay start of Pharmacological VTE agent (>24hrs) due to surgical blood loss or risk of bleeding: yes

## 2020-02-10 NOTE — Anesthesia Postprocedure Evaluation (Signed)
Anesthesia Post Note  Patient: Nimai Byars  Procedure(s) Performed: XI ROBOT ASSITED LAPAROSCOPIC NEPHROURETERECTOMY (Right ) CYSTOSCOPY (N/A ) PELVIC LYMPH NODE  and RETROPERITONEAL LYMPH NODE DISSECTION     Patient location during evaluation: PACU Anesthesia Type: General Level of consciousness: awake and alert Pain management: pain level controlled Vital Signs Assessment: post-procedure vital signs reviewed and stable Respiratory status: spontaneous breathing, nonlabored ventilation and respiratory function stable Cardiovascular status: blood pressure returned to baseline and stable Postop Assessment: no apparent nausea or vomiting Anesthetic complications: no    Last Vitals:  Vitals:   02/10/20 1545 02/10/20 1600  BP: (!) 155/78 (!) 147/76  Pulse: 79 80  Resp: 17 (!) 9  Temp:    SpO2: 98% 97%    Last Pain:  Vitals:   02/10/20 1600  TempSrc:   PainSc: 2                  Lidia Collum

## 2020-02-10 NOTE — Anesthesia Procedure Notes (Signed)
Procedure Name: Intubation Date/Time: 02/10/2020 11:10 AM Performed by: Suan Halter, CRNA Pre-anesthesia Checklist: Patient identified, Emergency Drugs available, Suction available and Patient being monitored Patient Re-evaluated:Patient Re-evaluated prior to induction Oxygen Delivery Method: Circle system utilized Preoxygenation: Pre-oxygenation with 100% oxygen Induction Type: IV induction Ventilation: Mask ventilation without difficulty Laryngoscope Size: Mac and 4 Grade View: Grade I Tube type: Oral Tube size: 7.5 mm Number of attempts: 1 Airway Equipment and Method: Stylet and Oral airway Placement Confirmation: ETT inserted through vocal cords under direct vision,  positive ETCO2 and breath sounds checked- equal and bilateral Secured at: 23 cm Tube secured with: Tape Dental Injury: Teeth and Oropharynx as per pre-operative assessment  Comments: Intubated by lauren ferm, srna under supervision of md and crna

## 2020-02-10 NOTE — Anesthesia Preprocedure Evaluation (Signed)
Anesthesia Evaluation  Patient identified by MRN, date of birth, ID band Patient awake    Reviewed: Allergy & Precautions, NPO status , Patient's Chart, lab work & pertinent test results  History of Anesthesia Complications Negative for: history of anesthetic complications  Airway Mallampati: I  TM Distance: >3 FB Neck ROM: Full    Dental  (+) Teeth Intact   Pulmonary neg pulmonary ROS,    Pulmonary exam normal        Cardiovascular hypertension, Normal cardiovascular exam     Neuro/Psych negative neurological ROS  negative psych ROS   GI/Hepatic Neg liver ROS, GERD  ,  Endo/Other  diabetes, Type 2  Renal/GU Renal disease (left ureteral mass)  negative genitourinary   Musculoskeletal negative musculoskeletal ROS (+)   Abdominal   Peds  Hematology  (+) anemia ,   Anesthesia Other Findings   Reproductive/Obstetrics                            Anesthesia Physical Anesthesia Plan  ASA: III  Anesthesia Plan: General   Post-op Pain Management:    Induction: Intravenous  PONV Risk Score and Plan: 2 and Ondansetron, Dexamethasone, Treatment may vary due to age or medical condition and Midazolam  Airway Management Planned: Oral ETT  Additional Equipment: None  Intra-op Plan:   Post-operative Plan: Extubation in OR  Informed Consent: I have reviewed the patients History and Physical, chart, labs and discussed the procedure including the risks, benefits and alternatives for the proposed anesthesia with the patient or authorized representative who has indicated his/her understanding and acceptance.     Dental advisory given  Plan Discussed with:   Anesthesia Plan Comments:         Anesthesia Quick Evaluation

## 2020-02-10 NOTE — Progress Notes (Signed)
Spouse called into PACU: upset no contact from hospital before during or after procedure.  Updated spouse on pt current status.  Contacted pre surgical area to call and inform pt's spouse why no contact made.

## 2020-02-11 LAB — BASIC METABOLIC PANEL
Anion gap: 9 (ref 5–15)
BUN: 19 mg/dL (ref 8–23)
CO2: 25 mmol/L (ref 22–32)
Calcium: 8.7 mg/dL — ABNORMAL LOW (ref 8.9–10.3)
Chloride: 101 mmol/L (ref 98–111)
Creatinine, Ser: 1.17 mg/dL (ref 0.61–1.24)
GFR calc Af Amer: >60 mL/min (ref >60)
GFR calc non Af Amer: >60 mL/min (ref >60)
Glucose, Bld: 277 mg/dL — ABNORMAL HIGH (ref 70–99)
Potassium: 4.2 mmol/L (ref 3.5–5.1)
Sodium: 135 mmol/L (ref 135–145)

## 2020-02-11 LAB — GLUCOSE, CAPILLARY
Glucose-Capillary: 217 mg/dL — ABNORMAL HIGH (ref 70–99)
Glucose-Capillary: 210 mg/dL — ABNORMAL HIGH (ref 70–99)
Glucose-Capillary: 156 mg/dL — ABNORMAL HIGH (ref 70–99)
Glucose-Capillary: 155 mg/dL — ABNORMAL HIGH (ref 70–99)

## 2020-02-11 LAB — HEMOGLOBIN AND HEMATOCRIT, BLOOD
HCT: 33.7 % — ABNORMAL LOW (ref 39.0–52.0)
Hemoglobin: 11.2 g/dL — ABNORMAL LOW (ref 13.0–17.0)

## 2020-02-11 MED ORDER — CHLORHEXIDINE GLUCONATE CLOTH 2 % EX PADS
6.0000 | MEDICATED_PAD | Freq: Every day | CUTANEOUS | Status: DC
  Administered 2020-02-11 – 2020-02-12 (×2): 6 via TOPICAL

## 2020-02-11 NOTE — Progress Notes (Signed)
Patient alert and oriented but still drowsy, unable to walk at this time.

## 2020-02-11 NOTE — Plan of Care (Signed)
  Problem: Education: Goal: Knowledge of General Education information will improve Description Including pain rating scale, medication(s)/side effects and non-pharmacologic comfort measures Outcome: Progressing   Problem: Health Behavior/Discharge Planning: Goal: Ability to manage health-related needs will improve Outcome: Progressing   

## 2020-02-11 NOTE — Progress Notes (Signed)
1 Day Post-Op Subjective: Patient reports mild incisional pain. hgb 11.2 creatinine 1.17. urine clear. No nausea/vomiting  Objective: Vital signs in last 24 hours: Temp:  [97.5 F (36.4 C)-98.9 F (37.2 C)] 98.1 F (36.7 C) (05/29 1456) Pulse Rate:  [61-96] 61 (05/29 1456) Resp:  [9-20] 20 (05/29 1456) BP: (134-157)/(64-78) 134/64 (05/29 1456) SpO2:  [93 %-98 %] 96 % (05/29 1456)  Intake/Output from previous day: 05/28 0701 - 05/29 0700 In: 2730.4 [I.V.:2530.4; IV Piggyback:200] Out: 1095 [Urine:745; Drains:200; Blood:150] Intake/Output this shift: Total I/O In: 480 [P.O.:480] Out: 510 [Urine:500; Drains:10]  Physical Exam:  General:alert, cooperative and appears stated age GI: soft, non tender, normal bowel sounds, no palpable masses, no organomegaly, no inguinal hernia Male genitalia: not done Extremities: extremities normal, atraumatic, no cyanosis or edema  Lab Results: Recent Labs    02/10/20 1501 02/11/20 0524  HGB 12.1* 11.2*  HCT 37.2* 33.7*   BMET Recent Labs    02/11/20 0524  NA 135  K 4.2  CL 101  CO2 25  GLUCOSE 277*  BUN 19  CREATININE 1.17  CALCIUM 8.7*   No results for input(s): LABPT, INR in the last 72 hours. No results for input(s): LABURIN in the last 72 hours. Results for orders placed or performed during the hospital encounter of 02/07/20  SARS CORONAVIRUS 2 (TAT 6-24 HRS) Nasopharyngeal Nasopharyngeal Swab     Status: None   Collection Time: 02/07/20 11:04 AM   Specimen: Nasopharyngeal Swab  Result Value Ref Range Status   SARS Coronavirus 2 NEGATIVE NEGATIVE Final    Comment: (NOTE) SARS-CoV-2 target nucleic acids are NOT DETECTED. The SARS-CoV-2 RNA is generally detectable in upper and lower respiratory specimens during the acute phase of infection. Negative results do not preclude SARS-CoV-2 infection, do not rule out co-infections with other pathogens, and should not be used as the sole basis for treatment or other patient  management decisions. Negative results must be combined with clinical observations, patient history, and epidemiological information. The expected result is Negative. Fact Sheet for Patients: SugarRoll.be Fact Sheet for Healthcare Providers: https://www.woods-mathews.com/ This test is not yet approved or cleared by the [place] FDA and  has been authorized for detection and/or diagnosis of SARS-CoV-2 by FDA under an Emergency Use Authorization (EUA). This EUA will remain  in effect (meaning this test can be used) for the duration of the COVID-19 declaration under Section 56 4(b)(1) of the Act, 21 U.S.C. section 360bbb-3(b)(1), unless the authorization is terminated or revoked sooner. Performed at [facility], [place] [address], [place], [place] [address]     Studies/Results: No results found.  Assessment/Plan: POD#1 right nephroureterectomy  1. Clear liquid diet, advance as tolerated 2. ambulate in halls with assistance 3. Continue foley to straight drain   LOS: 1 day   Nicolette Bang 02/11/2020, 3:22 PM

## 2020-02-12 LAB — GLUCOSE, CAPILLARY
Glucose-Capillary: 158 mg/dL — ABNORMAL HIGH (ref 70–99)
Glucose-Capillary: 190 mg/dL — ABNORMAL HIGH (ref 70–99)

## 2020-02-12 LAB — CREATININE, FLUID (PLEURAL, PERITONEAL, JP DRAINAGE): Creat, Fluid: 1.1 mg/dL

## 2020-02-12 MED ORDER — SULFAMETHOXAZOLE-TRIMETHOPRIM 800-160 MG PO TABS: 1.0000 | ORAL_TABLET | Freq: Two times a day (BID) | ORAL | 0 refills | Status: DC

## 2020-02-12 NOTE — Progress Notes (Signed)
2 Days Post-Op Subjective: Patient reports intense pain last evening but was able to get great relieve from oral pain medications. Feels well this AM. Hasn't had much to eat.  Objective: Vital signs in last 24 hours: Temp:  [98.1 F (36.7 C)-98.8 F (37.1 C)] 98.6 F (37 C) (05/30 0606) Pulse Rate:  [61-79] 76 (05/30 0606) Resp:  [20] 20 (05/30 0606) BP: (134-178)/(64-87) 145/73 (05/30 0606) SpO2:  [95 %-97 %] 95 % (05/30 0606)  Intake/Output from previous day: 05/29 0701 - 05/30 0700 In: 2338.8 [P.O.:720; I.V.:1568.4; IV Piggyback:50.4] Out: 1255 [Urine:1100; Drains:155] Intake/Output this shift: No intake/output data recorded.  Physical Exam:  General:alert, cooperative and appears stated age GI: soft, non tender, normal bowel sounds, no palpable masses, no organomegaly, no inguinal hernia JP draining serosanginous fluid Foley clear yellow. Male genitalia: not done Extremities: extremities normal, atraumatic, no cyanosis or edema  Lab Results: Recent Labs    02/10/20 1501 02/11/20 0524  HGB 12.1* 11.2*  HCT 37.2* 33.7*   BMET Recent Labs    02/11/20 0524  NA 135  K 4.2  CL 101  CO2 25  GLUCOSE 277*  BUN 19  CREATININE 1.17  CALCIUM 8.7*   No results for input(s): LABPT, INR in the last 72 hours. No results for input(s): LABURIN in the last 72 hours. Results for orders placed or performed during the hospital encounter of 02/07/20  SARS CORONAVIRUS 2 (TAT 6-24 HRS) Nasopharyngeal Nasopharyngeal Swab     Status: None   Collection Time: 02/07/20 11:04 AM   Specimen: Nasopharyngeal Swab  Result Value Ref Range Status   SARS Coronavirus 2 NEGATIVE NEGATIVE Final    Comment: (NOTE) SARS-CoV-2 target nucleic acids are NOT DETECTED. The SARS-CoV-2 RNA is generally detectable in upper and lower respiratory specimens during the acute phase of infection. Negative results do not preclude SARS-CoV-2 infection, do not rule out co-infections with other pathogens,  and should not be used as the sole basis for treatment or other patient management decisions. Negative results must be combined with clinical observations, patient history, and epidemiological information. The expected result is Negative. Fact Sheet for Patients: SugarRoll.be Fact Sheet for Healthcare Providers: https://www.woods-mathews.com/ This test is not yet approved or cleared by the [place] FDA and  has been authorized for detection and/or diagnosis of SARS-CoV-2 by FDA under an Emergency Use Authorization (EUA). This EUA will remain  in effect (meaning this test can be used) for the duration of the COVID-19 declaration under Section 56 4(b)(1) of the Act, 21 U.S.C. section 360bbb-3(b)(1), unless the authorization is terminated or revoked sooner. Performed at [facility], [place] [address], [place], [place] [address]     Studies/Results: No results found.  Assessment/Plan: POD#2 right nephroureterectomy  1. Advance diet 2. HLIVF 3. Continue foley to straight drain 4. Send JP fluid for creatinine 5. Encourage ambulation 6. D/c after lunch.   LOS: 2 days   Ardis Hughs 02/12/2020, 9:58 AM 2 Days Post-Op

## 2020-02-12 NOTE — Discharge Summary (Signed)
Date of admission: 02/10/2020  Date of discharge: 02/12/2020  Admission diagnosis: right ureteral cancer  Discharge diagnosis: same  Secondary diagnoses:  Patient Active Problem List   Diagnosis Date Noted  . Ureteral neoplasm 02/10/2020    Procedures performed: Procedure(s): XI ROBOT ASSITED LAPAROSCOPIC NEPHROURETERECTOMY CYSTOSCOPY PELVIC LYMPH NODE  and RETROPERITONEAL LYMPH NODE DISSECTION  History and Physical: For full details, please see admission history and physical. Briefly, Hector Lopez is a 74 y.o. year old patient with right ureteral cancer.   Hospital Course: Patient tolerated the procedure well.  He was then transferred to the floor after an uneventful PACU stay.  His hospital course was uncomplicated.  On POD#2 he had met discharge criteria: was eating a regular diet, was up and ambulating independently,  pain was well controlled,  and was ready to for discharge.   Laboratory values:  Recent Labs    02/10/20 1501 02/11/20 0524  HGB 12.1* 11.2*  HCT 37.2* 33.7*   Recent Labs    02/11/20 0524  NA 135  K 4.2  CL 101  CO2 25  GLUCOSE 277*  BUN 19  CREATININE 1.17  CALCIUM 8.7*   No results for input(s): LABPT, INR in the last 72 hours. No results for input(s): LABURIN in the last 72 hours. Results for orders placed or performed during the hospital encounter of 02/07/20  SARS CORONAVIRUS 2 (TAT 6-24 HRS) Nasopharyngeal Nasopharyngeal Swab     Status: None   Collection Time: 02/07/20 11:04 AM   Specimen: Nasopharyngeal Swab  Result Value Ref Range Status   SARS Coronavirus 2 NEGATIVE NEGATIVE Final    Comment: (NOTE) SARS-CoV-2 target nucleic acids are NOT DETECTED. The SARS-CoV-2 RNA is generally detectable in upper and lower respiratory specimens during the acute phase of infection. Negative results do not preclude SARS-CoV-2 infection, do not rule out co-infections with other pathogens, and should not be used as the sole basis for treatment or  other patient management decisions. Negative results must be combined with clinical observations, patient history, and epidemiological information. The expected result is Negative. Fact Sheet for Patients: SugarRoll.be Fact Sheet for Healthcare Providers: https://www.woods-mathews.com/ This test is not yet approved or cleared by the [place] FDA and  has been authorized for detection and/or diagnosis of SARS-CoV-2 by FDA under an Emergency Use Authorization (EUA). This EUA will remain  in effect (meaning this test can be used) for the duration of the COVID-19 declaration under Section 56 4(b)(1) of the Act, 21 U.S.C. section 360bbb-3(b)(1), unless the authorization is terminated or revoked sooner. Performed at [facility], [place] [address] [place], [place] [address]     Disposition: Home  Discharge instruction: The patient was instructed to be ambulatory but told to refrain from heavy lifting, strenuous activity, or driving.   Discharge medications:  Allergies as of 02/12/2020      Reactions   Codeine Nausea And Vomiting      Medication List    TAKE these medications   amLODipine 10 MG tablet Commonly known as: NORVASC Take 10 mg by mouth at bedtime.   cetirizine 10 MG tablet Commonly known as: ZYRTEC Take 10 mg by mouth daily.   glipiZIDE 10 MG tablet Commonly known as: GLUCOTROL Take 10 mg by mouth daily before breakfast.   lisinopril 40 MG tablet Commonly known as: ZESTRIL Take 40 mg by mouth at bedtime.   metFORMIN 1000 MG tablet Commonly known as: GLUCOPHAGE Take 1,000 mg by mouth 2 (two) times daily with a  meal.   oxyCODONE-acetaminophen 5-325 MG tablet Commonly known as: Percocet Take 1-2 tablets by mouth every 6 (six) hours as needed for moderate pain or severe pain. Post-operatively What changed: how much to take   senna-docusate 8.6-50 MG tablet Commonly known as: Senokot-S Take 1 tablet by mouth 2  (two) times daily. While taking strong pain meds to prevent cosntipation.   sertraline 50 MG tablet Commonly known as: ZOLOFT Take 50 mg by mouth daily.   simvastatin 20 MG tablet Commonly known as: ZOCOR Take 20 mg by mouth daily.   sulfamethoxazole-trimethoprim 800-160 MG tablet Commonly known as: BACTRIM DS Take 1 tablet by mouth 2 (two) times daily. Start taking one day prior to your appointment for your first follow-up and catheter removal.  Continue taking for three days.       Followup:  Follow-up Information    Alexis Frock, MD On 02/20/2020.   Specialty: Urology Why: at 10 AM for MD visit and cather removal.  Contact information: [place] [id number] [phone]

## 2020-02-12 NOTE — Progress Notes (Signed)
Patient given discharge, catheter care, medication, and follow up instructions, verbalized understanding, IV x 2 and JP drain removed, personal belongings with patient, family to transport home

## 2020-02-12 NOTE — Plan of Care (Signed)

## 2020-02-16 LAB — SURGICAL PATHOLOGY

## 2020-02-20 ENCOUNTER — Other Ambulatory Visit: Payer: Self-pay

## 2020-02-20 ENCOUNTER — Encounter (HOSPITAL_COMMUNITY): Payer: Self-pay

## 2020-02-20 ENCOUNTER — Emergency Department (HOSPITAL_COMMUNITY)
Admission: EM | Admit: 2020-02-20 | Discharge: 2020-02-20 | Disposition: A | Discharge status: Home / Self Care | Payer: Medicare Other | Attending: Emergency Medicine | Admitting: Emergency Medicine

## 2020-02-20 DIAGNOSIS — I10 Essential (primary) hypertension: Secondary | ICD-10-CM | POA: Diagnosis not present

## 2020-02-20 DIAGNOSIS — Z79899 Other long term (current) drug therapy: Secondary | ICD-10-CM | POA: Insufficient documentation

## 2020-02-20 DIAGNOSIS — R339 Retention of urine, unspecified: Secondary | ICD-10-CM | POA: Diagnosis not present

## 2020-02-20 DIAGNOSIS — Z9889 Other specified postprocedural states: Secondary | ICD-10-CM | POA: Diagnosis not present

## 2020-02-20 DIAGNOSIS — E119 Type 2 diabetes mellitus without complications: Secondary | ICD-10-CM | POA: Diagnosis not present

## 2020-02-20 DIAGNOSIS — Z7984 Long term (current) use of oral hypoglycemic drugs: Secondary | ICD-10-CM | POA: Insufficient documentation

## 2020-02-20 LAB — CBC WITH DIFFERENTIAL/PLATELET
Abs Immature Granulocytes: 0.03 10*3/uL (ref 0.00–0.07)
Basophils Absolute: 0 10*3/uL (ref 0.0–0.1)
Basophils Relative: 0 %
Eosinophils Absolute: 0.4 10*3/uL (ref 0.0–0.5)
Eosinophils Relative: 7 %
HCT: 35.6 % — ABNORMAL LOW (ref 39.0–52.0)
Hemoglobin: 11.8 g/dL — ABNORMAL LOW (ref 13.0–17.0)
Immature Granulocytes: 1 %
Lymphocytes Relative: 18 %
Lymphs Abs: 1.1 10*3/uL (ref 0.7–4.0)
MCH: 29.4 pg (ref 26.0–34.0)
MCHC: 33.1 g/dL (ref 30.0–36.0)
MCV: 88.6 fL (ref 80.0–100.0)
Monocytes Absolute: 0.4 10*3/uL (ref 0.1–1.0)
Monocytes Relative: 7 %
Neutro Abs: 4 10*3/uL (ref 1.7–7.7)
Neutrophils Relative %: 67 %
Platelets: 272 10*3/uL (ref 150–400)
RBC: 4.02 MIL/uL — ABNORMAL LOW (ref 4.22–5.81)
RDW: 12.7 % (ref 11.5–15.5)
WBC: 6 10*3/uL (ref 4.0–10.5)
nRBC: 0 % (ref 0.0–0.2)

## 2020-02-20 LAB — BASIC METABOLIC PANEL
Anion gap: 12 (ref 5–15)
BUN: 18 mg/dL (ref 8–23)
CO2: 23 mmol/L (ref 22–32)
Calcium: 9.2 mg/dL (ref 8.9–10.3)
Chloride: 103 mmol/L (ref 98–111)
Creatinine, Ser: 1.36 mg/dL — ABNORMAL HIGH (ref 0.61–1.24)
GFR calc Af Amer: 59 mL/min — ABNORMAL LOW (ref >60)
GFR calc non Af Amer: 51 mL/min — ABNORMAL LOW (ref >60)
Glucose, Bld: 162 mg/dL — ABNORMAL HIGH (ref 70–99)
Potassium: 4.5 mmol/L (ref 3.5–5.1)
Sodium: 138 mmol/L (ref 135–145)

## 2020-02-20 LAB — URINALYSIS, ROUTINE W REFLEX MICROSCOPIC
Bacteria, UA: NONE SEEN
Bilirubin Urine: NEGATIVE
Glucose, UA: 50 mg/dL — AB
Ketones, ur: NEGATIVE mg/dL
Leukocytes,Ua: NEGATIVE
Nitrite: NEGATIVE
Protein, ur: NEGATIVE mg/dL
RBC / HPF: >50 RBC/hpf — ABNORMAL HIGH (ref 0–5)
Specific Gravity, Urine: 1.015 (ref 1.005–1.030)
pH: 5 (ref 5.0–8.0)

## 2020-02-20 NOTE — ED Triage Notes (Signed)
Patient reports a right nephrectomy 10 days ago. Patient had his foley cath removed at 1030 today and has not urinated since.

## 2020-02-20 NOTE — Discharge Instructions (Addendum)
Please keep Foley catheter in until you see Dr. Tresa Moore.  Your kidney function is slightly abnormal and can be repeated with your doctor.  Stay hydrated.  Return to ER if you unable to urinate, blood clots in the catheter, fever.
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
## 2020-02-21 LAB — URINE CULTURE: Culture: NO GROWTH

## 2020-03-20 DIAGNOSIS — R338 Other retention of urine: Secondary | ICD-10-CM | POA: Diagnosis not present

## 2020-04-03 DIAGNOSIS — R338 Other retention of urine: Secondary | ICD-10-CM | POA: Diagnosis not present

## 2020-04-03 DIAGNOSIS — N401 Enlarged prostate with lower urinary tract symptoms: Secondary | ICD-10-CM | POA: Diagnosis not present

## 2020-04-04 ENCOUNTER — Other Ambulatory Visit: Payer: Self-pay | Admitting: Urology

## 2020-04-09 NOTE — Patient Instructions (Addendum)
DUE TO COVID-19 ONLY ONE VISITOR ARE ALLOWED TO COME WITH YOU AND STAY IN THE WAITING ROOM ONLY DURING PRE OP AND PROCEDURE. THEN TWO VISITORS MAY VISIT WITH YOU IN YOUR PRIVATE ROOM DURING VISITING HOURS ONLY!! (10AM-8PM)   COVID SWAB TESTING MUST BE COMPLETED ON:     Tuesday, 04-10-20 @ 10:30  [place], [place] [facility] enter pre surgical testing line (Must self quarantine after testing. Follow instructions on handout.)                Your procedure is scheduled on: 04-13-20   Report to [facility] Main  Entrance    Report to admitting at 8:30 AM   Call this number if you have problems the morning of surgery [phone]   Do not eat food :After Midnight.   May have liquids until 7:30 AM  day of surgery  CLEAR LIQUID DIET  Foods Allowed                                                                     Foods Excluded  Water, Black Coffee and tea, regular and decaf             liquids that you cannot  Plain Jell-O in any flavor  (No red)                                    see through such as: Fruit ices (not with fruit pulp)                                      milk, soups, orange juice              Iced Popsicles (No red)                                      All solid food                                   Apple juices Sports drinks like Gatorade (No red) Lightly seasoned clear broth or consume(fat free) Sugar, honey syrup    Oral Hygiene is also important to reduce your risk of infection.                                    Remember - BRUSH YOUR TEETH THE MORNING OF SURGERY WITH YOUR REGULAR TOOTHPASTE.  Afterwards no water, gum, candy or mints   Do NOT smoke after Midnight   Take these medicines the morning of surgery with A SIP OF WATER: Cetirizine (Zyrtec), Omeprazole (Prilosec), Sertraline (Zoloft), Simvastatin (Zocor)                              You may not have any metal on your body including jewelry,  and body piercings               Do not wear lotions, powders, cologne, or deodorant              Men may shave face and neck.   Do not bring valuables to the hospital. [place].   Contacts, dentures or bridgework may not be worn into surgery.   Bring small overnight bag day of surgery.      Please read over the following fact sheets you were given: IF YOU HAVE QUESTIONS ABOUT YOUR PRE OP INSTRUCTIONS PLEASE CALL [phone]   How to Manage Your Diabetes Before and After Surgery  Why is it important to control my blood sugar before and after surgery?  Improving blood sugar levels before and after surgery helps healing and can limit problems.  A way of improving blood sugar control is eating a healthy diet by: o  Eating less sugar and carbohydrates o  Increasing activity/exercise o  Talking with your doctor about reaching your blood sugar goals  High blood sugars (greater than 180 mg/dL) can raise your risk of infections and slow your recovery, so you will need to focus on controlling your diabetes during the weeks before surgery.  Make sure that the doctor who takes care of your diabetes knows about your planned surgery including the date and location.  How do I manage my blood sugar before surgery?  Check your blood sugar at least 4 times a day, starting 2 days before surgery, to make sure that the level is not too high or low. o Check your blood sugar the morning of your surgery when you wake up and every 2 hours until you get to the Short Stay unit.  If your blood sugar is less than 70 mg/dL, you will need to treat for low blood sugar: o Do not take insulin. o Treat a low blood sugar (less than 70 mg/dL) with  cup of clear juice (cranberry or apple), 4 glucose tablets, OR glucose gel. o Recheck blood sugar in 15 minutes after treatment (to make sure it is greater than 70 mg/dL). If your blood sugar is not greater than 70 mg/dL on recheck, call [phone]  for further instructions.  Report your blood sugar to the short stay nurse when you get to Short Stay.   If you are admitted to the hospital after surgery: o Your blood sugar will be checked by the staff and you will probably be given insulin after surgery (instead of oral diabetes medicines) to make sure you have good blood sugar levels. o The goal for blood sugar control after surgery is 80-180 mg/dL.   WHAT DO I DO ABOUT MY DIABETES MEDICATION?   Do not take oral diabetes medicines (pills) the morning of surgery.    The day before surgery ok to take Glipizide in the morning/lunch.  Ok to take Metformin   Reviewed and Endorsed by [facility] Patient Education Committee, August 2015   [facility] - Preparing for Surgery Before surgery, you can play an important role.  Because skin is not sterile, your skin needs to be as free of germs as possible.  You can reduce the number of germs on your skin by washing with CHG (chlorahexidine gluconate) soap before surgery.  CHG is an antiseptic cleaner which kills germs and bonds with the skin to continue killing germs even after washing. Please DO NOT use if you have  an allergy to CHG or antibacterial soaps.  If your skin becomes reddened/irritated stop using the CHG and inform your nurse when you arrive at Short Stay. Do not shave (including legs and underarms) for at least 48 hours prior to the first CHG shower.  You may shave your face/neck.  Please follow these instructions carefully:  1.  Shower with CHG Soap the night before surgery and the  morning of surgery.  2.  If you choose to wash your hair, wash your hair first as usual with your normal  shampoo.  3.  After you shampoo, rinse your hair and body thoroughly to remove the shampoo.                             4.  Use CHG as you would any other liquid soap.  You can apply chg directly to the skin and wash.  Gently with a scrungie or clean washcloth.  5.  Apply the CHG Soap to your  body ONLY FROM THE NECK DOWN.   Do   not use on face/ open                           Wound or open sores. Avoid contact with eyes, ears mouth and   genitals (private parts).                       Wash face,  Genitals (private parts) with your normal soap.             6.  Wash thoroughly, paying special attention to the area where your    surgery  will be performed.  7.  Thoroughly rinse your body with warm water from the neck down.  8.  DO NOT shower/wash with your normal soap after using and rinsing off the CHG Soap.                9.  Pat yourself dry with a clean towel.            10.  Wear clean pajamas.            11.  Place clean sheets on your bed the night of your first shower and do not  sleep with pets. Day of Surgery : Do not apply any lotions/deodorants the morning of surgery.  Please wear clean clothes to the hospital/surgery center.  FAILURE TO FOLLOW THESE INSTRUCTIONS MAY RESULT IN THE CANCELLATION OF YOUR SURGERY  PATIENT SIGNATURE_________________________________  NURSE SIGNATURE__________________________________  ________________________________________________________________________

## 2020-04-09 NOTE — Progress Notes (Addendum)
COVID Vaccine Completed: No  Date COVID Vaccine completed: COVID vaccine manufacturer: Pfizer    [employer]   PCP - Dr. Judd Lien Cardiologist - N/A  No Back Stimulator  Chest x-ray -  EKG - 12-09-19 in Epic Stress Test -  ECHO -  Cardiac Cath -   Sleep Study -  CPAP -   Fasting Blood Sugar - 150 range Checks Blood Sugar check every 2-3 days  Blood Thinner Instructions: Aspirin Instructions: ASA 81 mg  Last Dose:  04-06-20  Anesthesia review:   Patient denies shortness of breath, fever, cough and chest pain at PAT appointment   Patient verbalized understanding of instructions that were given to them at the PAT appointment. Patient was also instructed that they will need to review over the PAT instructions again at home before surgery.

## 2020-04-11 ENCOUNTER — Encounter (HOSPITAL_COMMUNITY): Payer: Self-pay

## 2020-04-11 ENCOUNTER — Encounter (HOSPITAL_COMMUNITY)
Admission: RE | Admit: 2020-04-11 | Discharge: 2020-04-11 | Disposition: A | Discharge status: Home / Self Care | Payer: Medicare Other | Source: Ambulatory Visit | Attending: Urology | Admitting: Urology

## 2020-04-11 ENCOUNTER — Other Ambulatory Visit (HOSPITAL_COMMUNITY)
Admission: RE | Admit: 2020-04-11 | Discharge: 2020-04-11 | Disposition: A | Discharge status: Home / Self Care | Payer: Medicare Other | Source: Ambulatory Visit | Attending: Urology | Admitting: Urology

## 2020-04-11 ENCOUNTER — Other Ambulatory Visit: Payer: Self-pay

## 2020-04-11 DIAGNOSIS — Z01812 Encounter for preprocedural laboratory examination: Secondary | ICD-10-CM | POA: Insufficient documentation

## 2020-04-11 DIAGNOSIS — Z20822 Contact with and (suspected) exposure to covid-19: Secondary | ICD-10-CM | POA: Insufficient documentation

## 2020-04-11 HISTORY — DX: Anemia, unspecified: D64.9

## 2020-04-11 HISTORY — DX: Malignant (primary) neoplasm, unspecified: C80.1

## 2020-04-11 LAB — BASIC METABOLIC PANEL
Anion gap: 10 (ref 5–15)
BUN: 24 mg/dL — ABNORMAL HIGH (ref 8–23)
CO2: 26 mmol/L (ref 22–32)
Calcium: 9.5 mg/dL (ref 8.9–10.3)
Chloride: 103 mmol/L (ref 98–111)
Creatinine, Ser: 1.25 mg/dL — ABNORMAL HIGH (ref 0.61–1.24)
GFR calc Af Amer: >60 mL/min (ref >60)
GFR calc non Af Amer: 56 mL/min — ABNORMAL LOW (ref >60)
Glucose, Bld: 173 mg/dL — ABNORMAL HIGH (ref 70–99)
Potassium: 5.4 mmol/L — ABNORMAL HIGH (ref 3.5–5.1)
Sodium: 139 mmol/L (ref 135–145)

## 2020-04-11 LAB — SARS CORONAVIRUS 2 (TAT 6-24 HRS): SARS Coronavirus 2: NEGATIVE

## 2020-04-11 LAB — HEMOGLOBIN A1C
Hgb A1c MFr Bld: 6.7 % — ABNORMAL HIGH (ref 4.8–5.6)
Mean Plasma Glucose: 145.59 mg/dL

## 2020-04-11 LAB — CBC
HCT: 36.8 % — ABNORMAL LOW (ref 39.0–52.0)
Hemoglobin: 11.7 g/dL — ABNORMAL LOW (ref 13.0–17.0)
MCH: 28.4 pg (ref 26.0–34.0)
MCHC: 31.8 g/dL (ref 30.0–36.0)
MCV: 89.3 fL (ref 80.0–100.0)
Platelets: 220 10*3/uL (ref 150–400)
RBC: 4.12 MIL/uL — ABNORMAL LOW (ref 4.22–5.81)
RDW: 12.9 % (ref 11.5–15.5)
WBC: 5.1 10*3/uL (ref 4.0–10.5)
nRBC: 0 % (ref 0.0–0.2)

## 2020-04-11 LAB — GLUCOSE, CAPILLARY: Glucose-Capillary: 161 mg/dL — ABNORMAL HIGH (ref 70–99)

## 2020-04-11 NOTE — Progress Notes (Addendum)
BMP sent to Dr. Tresa Moore for review.  BMP also review by Konrad Felix, PA-C, no further orders given, ok to proceed.

## 2020-04-12 MED ORDER — GENTAMICIN SULFATE 40 MG/ML IJ SOLN
5.0000 mg/kg | INTRAVENOUS | Status: AC
  Administered 2020-04-13: 380 mg via INTRAVENOUS
  Filled 2020-04-12: qty 9.5

## 2020-04-12 NOTE — Anesthesia Preprocedure Evaluation (Addendum)
Anesthesia Evaluation  Patient identified by MRN, date of birth, ID band Patient awake    Reviewed: Allergy & Precautions, NPO status , Patient's Chart, lab work & pertinent test results  History of Anesthesia Complications Negative for: history of anesthetic complications  Airway Mallampati: I  TM Distance: >3 FB Neck ROM: Full    Dental no notable dental hx. (+) Teeth Intact, Dental Advisory Given   Pulmonary    Pulmonary exam normal breath sounds clear to auscultation       Cardiovascular hypertension, Pt. on medications Normal cardiovascular exam Rhythm:Regular Rate:Normal     Neuro/Psych    GI/Hepatic GERD  Medicated,  Endo/Other  diabetes, Type 2  Renal/GU Renal disease (left ureteral mass)     Musculoskeletal   Abdominal   Peds negative pediatric ROS (+)  Hematology  (+) Blood dyscrasia, anemia ,   Anesthesia Other Findings   Reproductive/Obstetrics                            Anesthesia Physical  Anesthesia Plan  ASA: III  Anesthesia Plan: General   Post-op Pain Management:    Induction: Intravenous  PONV Risk Score and Plan: 2 and Ondansetron, Dexamethasone, Treatment may vary due to age or medical condition and Propofol infusion  Airway Management Planned: Oral ETT and LMA  Additional Equipment: None  Intra-op Plan:   Post-operative Plan: Extubation in OR  Informed Consent: I have reviewed the patients History and Physical, chart, labs and discussed the procedure including the risks, benefits and alternatives for the proposed anesthesia with the patient or authorized representative who has indicated his/her understanding and acceptance.     Dental advisory given  Plan Discussed with: Anesthesiologist, CRNA and Surgeon  Anesthesia Plan Comments:        Anesthesia Quick Evaluation

## 2020-04-13 ENCOUNTER — Observation Stay (HOSPITAL_COMMUNITY)
Admission: RE | Admit: 2020-04-13 | Discharge: 2020-04-14 | Disposition: A | Discharge status: Home / Self Care | Payer: Medicare Other | Attending: Urology | Admitting: Urology

## 2020-04-13 ENCOUNTER — Ambulatory Visit (HOSPITAL_COMMUNITY): Payer: Medicare Other | Admitting: Certified Registered"

## 2020-04-13 ENCOUNTER — Ambulatory Visit (HOSPITAL_COMMUNITY): Payer: Medicare Other | Admitting: Physician Assistant

## 2020-04-13 ENCOUNTER — Other Ambulatory Visit: Payer: Self-pay

## 2020-04-13 ENCOUNTER — Encounter (HOSPITAL_COMMUNITY)
Admission: RE | Disposition: A | Discharge status: Home / Self Care | Payer: Self-pay | Source: Home / Self Care | Attending: Urology

## 2020-04-13 ENCOUNTER — Encounter (HOSPITAL_COMMUNITY): Payer: Self-pay | Admitting: Urology

## 2020-04-13 DIAGNOSIS — Z79899 Other long term (current) drug therapy: Secondary | ICD-10-CM | POA: Diagnosis not present

## 2020-04-13 DIAGNOSIS — Z7984 Long term (current) use of oral hypoglycemic drugs: Secondary | ICD-10-CM | POA: Insufficient documentation

## 2020-04-13 DIAGNOSIS — E119 Type 2 diabetes mellitus without complications: Secondary | ICD-10-CM | POA: Diagnosis not present

## 2020-04-13 DIAGNOSIS — N400 Enlarged prostate without lower urinary tract symptoms: Principal | ICD-10-CM | POA: Insufficient documentation

## 2020-04-13 DIAGNOSIS — R339 Retention of urine, unspecified: Secondary | ICD-10-CM | POA: Diagnosis present

## 2020-04-13 DIAGNOSIS — Z8554 Personal history of malignant neoplasm of ureter: Secondary | ICD-10-CM | POA: Diagnosis not present

## 2020-04-13 DIAGNOSIS — Z20822 Contact with and (suspected) exposure to covid-19: Secondary | ICD-10-CM | POA: Insufficient documentation

## 2020-04-13 DIAGNOSIS — I10 Essential (primary) hypertension: Secondary | ICD-10-CM | POA: Insufficient documentation

## 2020-04-13 DIAGNOSIS — R338 Other retention of urine: Secondary | ICD-10-CM | POA: Diagnosis not present

## 2020-04-13 DIAGNOSIS — N401 Enlarged prostate with lower urinary tract symptoms: Secondary | ICD-10-CM | POA: Diagnosis not present

## 2020-04-13 HISTORY — PX: TRANSURETHRAL RESECTION OF PROSTATE: SHX73

## 2020-04-13 LAB — GLUCOSE, CAPILLARY
Glucose-Capillary: 149 mg/dL — ABNORMAL HIGH (ref 70–99)
Glucose-Capillary: 170 mg/dL — ABNORMAL HIGH (ref 70–99)

## 2020-04-13 LAB — HEMOGLOBIN AND HEMATOCRIT, BLOOD
HCT: 33.7 % — ABNORMAL LOW (ref 39.0–52.0)
Hemoglobin: 10.6 g/dL — ABNORMAL LOW (ref 13.0–17.0)

## 2020-04-13 SURGERY — TURP (TRANSURETHRAL RESECTION OF PROSTATE)
Anesthesia: General | Site: Prostate

## 2020-04-13 MED ORDER — ACETAMINOPHEN 160 MG/5ML PO SOLN: 325.0000 mg | ORAL | Status: DC | PRN

## 2020-04-13 MED ORDER — LACTATED RINGERS IV SOLN: INTRAVENOUS | Status: DC

## 2020-04-13 MED ORDER — PHENYLEPHRINE HCL (PRESSORS) 10 MG/ML IV SOLN
INTRAVENOUS | Status: AC
  Filled 2020-04-13: qty 1

## 2020-04-13 MED ORDER — PHENYLEPHRINE 40 MCG/ML (10ML) SYRINGE FOR IV PUSH (FOR BLOOD PRESSURE SUPPORT)
PREFILLED_SYRINGE | INTRAVENOUS | Status: AC
  Filled 2020-04-13: qty 10

## 2020-04-13 MED ORDER — LIDOCAINE 2% (20 MG/ML) 5 ML SYRINGE
INTRAMUSCULAR | Status: DC | PRN
  Administered 2020-04-13: 80 mg via INTRAVENOUS

## 2020-04-13 MED ORDER — FENTANYL CITRATE (PF) 100 MCG/2ML IJ SOLN
INTRAMUSCULAR | Status: AC
  Filled 2020-04-13: qty 2

## 2020-04-13 MED ORDER — OXYCODONE HCL 5 MG PO TABS: 5.0000 mg | ORAL_TABLET | Freq: Once | ORAL | Status: DC | PRN

## 2020-04-13 MED ORDER — SERTRALINE HCL 50 MG PO TABS
50.0000 mg | ORAL_TABLET | Freq: Every day | ORAL | Status: DC
  Administered 2020-04-14: 50 mg via ORAL
  Filled 2020-04-13: qty 1

## 2020-04-13 MED ORDER — MEPERIDINE HCL 50 MG/ML IJ SOLN: 6.2500 mg | INTRAMUSCULAR | Status: DC | PRN

## 2020-04-13 MED ORDER — PROPOFOL 500 MG/50ML IV EMUL
INTRAVENOUS | Status: AC
  Filled 2020-04-13: qty 50

## 2020-04-13 MED ORDER — GLIPIZIDE 10 MG PO TABS
10.0000 mg | ORAL_TABLET | Freq: Every day | ORAL | Status: DC
  Administered 2020-04-14: 10 mg via ORAL
  Filled 2020-04-13: qty 1

## 2020-04-13 MED ORDER — OXYCODONE HCL 5 MG PO TABS: 5.0000 mg | ORAL_TABLET | ORAL | Status: DC | PRN

## 2020-04-13 MED ORDER — SODIUM CHLORIDE 0.9 % IV SOLN: INTRAVENOUS | Status: DC

## 2020-04-13 MED ORDER — LORATADINE 10 MG PO TABS
10.0000 mg | ORAL_TABLET | Freq: Every day | ORAL | Status: DC
  Administered 2020-04-14: 10 mg via ORAL
  Filled 2020-04-13: qty 1

## 2020-04-13 MED ORDER — ORAL CARE MOUTH RINSE: 15.0000 mL | Freq: Once | OROMUCOSAL | Status: AC

## 2020-04-13 MED ORDER — PROPOFOL 10 MG/ML IV BOLUS
INTRAVENOUS | Status: DC | PRN
  Administered 2020-04-13: 20 mg via INTRAVENOUS
  Administered 2020-04-13: 30 mg via INTRAVENOUS
  Administered 2020-04-13: 200 mg via INTRAVENOUS

## 2020-04-13 MED ORDER — OXYCODONE HCL 5 MG/5ML PO SOLN: 5.0000 mg | Freq: Once | ORAL | Status: DC | PRN

## 2020-04-13 MED ORDER — SODIUM CHLORIDE 0.9 % IR SOLN
3000.0000 mL | Status: DC
  Administered 2020-04-13: 3000 mL

## 2020-04-13 MED ORDER — LISINOPRIL 20 MG PO TABS
40.0000 mg | ORAL_TABLET | Freq: Every day | ORAL | Status: DC
  Administered 2020-04-13: 40 mg via ORAL
  Filled 2020-04-13: qty 4

## 2020-04-13 MED ORDER — AMLODIPINE BESYLATE 10 MG PO TABS
10.0000 mg | ORAL_TABLET | Freq: Every day | ORAL | Status: DC
  Administered 2020-04-13: 10 mg via ORAL
  Filled 2020-04-13: qty 1

## 2020-04-13 MED ORDER — ONDANSETRON HCL 4 MG/2ML IJ SOLN: 4.0000 mg | Freq: Once | INTRAMUSCULAR | Status: DC | PRN

## 2020-04-13 MED ORDER — ONDANSETRON HCL 4 MG/2ML IJ SOLN
INTRAMUSCULAR | Status: AC
  Filled 2020-04-13: qty 2

## 2020-04-13 MED ORDER — SIMVASTATIN 20 MG PO TABS
20.0000 mg | ORAL_TABLET | Freq: Every day | ORAL | Status: DC
  Administered 2020-04-14: 20 mg via ORAL
  Filled 2020-04-13: qty 1

## 2020-04-13 MED ORDER — FENTANYL CITRATE (PF) 100 MCG/2ML IJ SOLN
INTRAMUSCULAR | Status: DC | PRN
  Administered 2020-04-13 (×2): 50 ug via INTRAVENOUS
  Administered 2020-04-13 (×2): 25 ug via INTRAVENOUS
  Administered 2020-04-13: 50 ug via INTRAVENOUS

## 2020-04-13 MED ORDER — DEXAMETHASONE SODIUM PHOSPHATE 10 MG/ML IJ SOLN
INTRAMUSCULAR | Status: DC | PRN
  Administered 2020-04-13: 10 mg via INTRAVENOUS

## 2020-04-13 MED ORDER — ACETAMINOPHEN 500 MG PO TABS
1000.0000 mg | ORAL_TABLET | Freq: Three times a day (TID) | ORAL | Status: AC
  Administered 2020-04-13: 1000 mg via ORAL
  Filled 2020-04-13: qty 2

## 2020-04-13 MED ORDER — PANTOPRAZOLE SODIUM 40 MG PO TBEC
40.0000 mg | DELAYED_RELEASE_TABLET | Freq: Every day | ORAL | Status: DC
  Administered 2020-04-14: 40 mg via ORAL
  Filled 2020-04-13: qty 1

## 2020-04-13 MED ORDER — OXYCODONE-ACETAMINOPHEN 5-325 MG PO TABS: 1.0000 | ORAL_TABLET | Freq: Four times a day (QID) | ORAL | 0 refills | Status: DC | PRN

## 2020-04-13 MED ORDER — EPHEDRINE 5 MG/ML INJ
INTRAVENOUS | Status: AC
  Filled 2020-04-13: qty 10

## 2020-04-13 MED ORDER — EPHEDRINE SULFATE-NACL 50-0.9 MG/10ML-% IV SOSY
PREFILLED_SYRINGE | INTRAVENOUS | Status: DC | PRN
  Administered 2020-04-13 (×3): 10 mg via INTRAVENOUS

## 2020-04-13 MED ORDER — CHLORHEXIDINE GLUCONATE 0.12 % MT SOLN
15.0000 mL | Freq: Once | OROMUCOSAL | Status: AC
  Administered 2020-04-13: 15 mL via OROMUCOSAL

## 2020-04-13 MED ORDER — SODIUM CHLORIDE 0.9 % IR SOLN
Status: DC | PRN
  Administered 2020-04-13: 9000 mL via INTRAVESICAL

## 2020-04-13 MED ORDER — HYDROMORPHONE HCL 1 MG/ML IJ SOLN: 0.5000 mg | INTRAMUSCULAR | Status: DC | PRN

## 2020-04-13 MED ORDER — METFORMIN HCL 500 MG PO TABS: 1000.0000 mg | ORAL_TABLET | Freq: Two times a day (BID) | ORAL | Status: DC

## 2020-04-13 MED ORDER — LIDOCAINE 2% (20 MG/ML) 5 ML SYRINGE
INTRAMUSCULAR | Status: AC
  Filled 2020-04-13: qty 5

## 2020-04-13 MED ORDER — METFORMIN HCL 500 MG PO TABS
1000.0000 mg | ORAL_TABLET | Freq: Two times a day (BID) | ORAL | Status: DC
  Administered 2020-04-13 – 2020-04-14 (×2): 1000 mg via ORAL
  Filled 2020-04-13 (×2): qty 2

## 2020-04-13 MED ORDER — SENNOSIDES-DOCUSATE SODIUM 8.6-50 MG PO TABS
1.0000 | ORAL_TABLET | Freq: Two times a day (BID) | ORAL | Status: DC
  Administered 2020-04-13 – 2020-04-14 (×2): 1 via ORAL
  Filled 2020-04-13 (×2): qty 1

## 2020-04-13 MED ORDER — FINASTERIDE 5 MG PO TABS
5.0000 mg | ORAL_TABLET | Freq: Every day | ORAL | Status: DC
  Administered 2020-04-13 – 2020-04-14 (×2): 5 mg via ORAL
  Filled 2020-04-13 (×2): qty 1

## 2020-04-13 MED ORDER — PHENYLEPHRINE 40 MCG/ML (10ML) SYRINGE FOR IV PUSH (FOR BLOOD PRESSURE SUPPORT)
PREFILLED_SYRINGE | INTRAVENOUS | Status: DC | PRN
  Administered 2020-04-13 (×2): 120 ug via INTRAVENOUS
  Administered 2020-04-13: 80 ug via INTRAVENOUS

## 2020-04-13 MED ORDER — ONDANSETRON HCL 4 MG/2ML IJ SOLN
INTRAMUSCULAR | Status: DC | PRN
  Administered 2020-04-13: 4 mg via INTRAVENOUS

## 2020-04-13 MED ORDER — SULFAMETHOXAZOLE-TRIMETHOPRIM 800-160 MG PO TABS: 1.0000 | ORAL_TABLET | Freq: Every day | ORAL | 0 refills | Status: DC

## 2020-04-13 MED ORDER — DEXAMETHASONE SODIUM PHOSPHATE 10 MG/ML IJ SOLN
INTRAMUSCULAR | Status: AC
  Filled 2020-04-13: qty 1

## 2020-04-13 MED ORDER — ACETAMINOPHEN 325 MG PO TABS: 325.0000 mg | ORAL_TABLET | ORAL | Status: DC | PRN

## 2020-04-13 MED ORDER — FENTANYL CITRATE (PF) 100 MCG/2ML IJ SOLN: 25.0000 ug | INTRAMUSCULAR | Status: DC | PRN

## 2020-04-13 SURGICAL SUPPLY — 21 items
BAG URINE DRAIN 2000ML AR STRL (UROLOGICAL SUPPLIES) ×3 IMPLANT
BAG URO CATCHER STRL LF (MISCELLANEOUS) ×3 IMPLANT
CATH FOLEY 3WAY 30CC 24FR (CATHETERS) ×2
CATH URTH STD 24FR FL 3W 2 (CATHETERS) ×1 IMPLANT
ELECT REM PT RETURN 15FT ADLT (MISCELLANEOUS) IMPLANT
GLOVE BIOGEL M STRL SZ7.5 (GLOVE) ×3 IMPLANT
GOWN STRL REUS W/TWL LRG LVL3 (GOWN DISPOSABLE) ×3 IMPLANT
GUIDEWIRE STR DUAL SENSOR (WIRE) ×3 IMPLANT
HOLDER FOLEY CATH W/STRAP (MISCELLANEOUS) ×3 IMPLANT
IV CATH 14GX2 1/4 (CATHETERS) ×3 IMPLANT
IV NS IRRIG 3000ML ARTHROMATIC (IV SOLUTION) ×9 IMPLANT
KIT TURNOVER KIT A (KITS) ×3 IMPLANT
LOOP CUT BIPOLAR 24F LRG (ELECTROSURGICAL) ×3 IMPLANT
MANIFOLD NEPTUNE II (INSTRUMENTS) ×3 IMPLANT
PACK CYSTO (CUSTOM PROCEDURE TRAY) ×3 IMPLANT
SYR 30ML LL (SYRINGE) ×3 IMPLANT
SYR TOOMEY IRRIG 70ML (MISCELLANEOUS) ×3
SYRINGE TOOMEY IRRIG 70ML (MISCELLANEOUS) ×1 IMPLANT
TUBING CONNECTING 10 (TUBING) ×2 IMPLANT
TUBING CONNECTING 10' (TUBING) ×1
TUBING UROLOGY SET (TUBING) ×3 IMPLANT

## 2020-04-13 NOTE — Transfer of Care (Signed)
Immediate Anesthesia Transfer of Care Note  Patient: Hector Lopez  Procedure(s) Performed: TRANSURETHRAL RESECTION OF THE PROSTATE (TURP) (N/A Prostate)  Patient Location: PACU  Anesthesia Type:General  Level of Consciousness: awake, oriented, patient cooperative and responds to stimulation  Airway & Oxygen Therapy: Patient Spontanous Breathing and Patient connected to face mask oxygen  Post-op Assessment: Report given to RN and Post -op Vital signs reviewed and stable  Post vital signs: Reviewed and stable  Last Vitals:  Vitals Value Taken Time  BP 132/67   Temp    Pulse 95 04/13/20 1200  Resp 14 04/13/20 1200  SpO2 100 % 04/13/20 1200  Vitals shown include unvalidated device data.  Last Pain:  Vitals:   04/13/20 0852  TempSrc:   PainSc: 0-No pain      Patients Stated Pain Goal: 4 (62/22/97 9892)  Complications: No complications documented.

## 2020-04-13 NOTE — Brief Op Note (Signed)
04/13/2020  11:42 AM  PATIENT:  Hector Lopez  74 y.o. male  PRE-OPERATIVE DIAGNOSIS:  LARGE PROSTATE WITH RETENTION  POST-OPERATIVE DIAGNOSIS:  LARGE PROSTATE WITH RETENTION  PROCEDURE:  Procedure(s) with comments: TRANSURETHRAL RESECTION OF THE PROSTATE (TURP) (N/A) - 75 MINS  SURGEON:  Surgeon(s) and Role:    * Alexis Frock, MD - Primary  PHYSICIAN ASSISTANT:   ASSISTANTS: none   ANESTHESIA:   general  EBL:  5 mL   BLOOD ADMINISTERED:none  DRAINS: 3 way foley to NS irrigation   LOCAL MEDICATIONS USED:  NONE  SPECIMEN:  Source of Specimen:  prostate chips  DISPOSITION OF SPECIMEN:  PATHOLOGY  COUNTS:  YES  TOURNIQUET:  * No tourniquets in log *  DICTATION: .Other Dictation: Dictation Number [id number]  PLAN OF CARE: Admit for overnight observation  PATIENT DISPOSITION:  PACU - hemodynamically stable.   Delay start of Pharmacological VTE agent (>24hrs) due to surgical blood loss or risk of bleeding: yes

## 2020-04-13 NOTE — Progress Notes (Signed)
1500- to present....Marland KitchenMarland KitchenAssumed care of the patient from off going RN. Condition stable. Pt CBI running not clots noted. Blood tinged urine noted clear no clots/ Continue with plan of care. Pt oob in chair eating
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
## 2020-04-13 NOTE — Discharge Instructions (Signed)
1 - You may have urinary urgency (bladder spasms) and bloody urine on / off for up to 3 weeks. This is normal. ° °2 - Call MD or go to ER for fever >102, severe pain / nausea / vomiting not relieved by medications, or acute change in medical status ° °

## 2020-04-13 NOTE — Anesthesia Procedure Notes (Signed)
Procedure Name: LMA Insertion Date/Time: 04/13/2020 11:02 AM Performed by: Silas Sacramento, CRNA Pre-anesthesia Checklist: Patient identified, Emergency Drugs available, Suction available and Patient being monitored Patient Re-evaluated:Patient Re-evaluated prior to induction Oxygen Delivery Method: Circle system utilized Preoxygenation: Pre-oxygenation with 100% oxygen Induction Type: IV induction LMA: LMA inserted LMA Size: 5.0 Tube type: Oral Number of attempts: 1 Placement Confirmation: ETT inserted through vocal cords under direct vision,  positive ETCO2 and breath sounds checked- equal and bilateral Tube secured with: Tape Dental Injury: Teeth and Oropharynx as per pre-operative assessment

## 2020-04-14 LAB — HEMOGLOBIN AND HEMATOCRIT, BLOOD
HCT: 32.2 % — ABNORMAL LOW (ref 39.0–52.0)
Hemoglobin: 10.2 g/dL — ABNORMAL LOW (ref 13.0–17.0)

## 2020-04-14 MED ORDER — OXYCODONE-ACETAMINOPHEN 5-325 MG PO TABS: 1.0000 | ORAL_TABLET | Freq: Four times a day (QID) | ORAL | 0 refills | Status: AC | PRN

## 2020-04-14 MED ORDER — SULFAMETHOXAZOLE-TRIMETHOPRIM 800-160 MG PO TABS: 1.0000 | ORAL_TABLET | Freq: Every day | ORAL | 0 refills | Status: DC

## 2020-04-14 MED ORDER — CHLORHEXIDINE GLUCONATE CLOTH 2 % EX PADS
6.0000 | MEDICATED_PAD | Freq: Every day | CUTANEOUS | Status: DC
  Administered 2020-04-14: 6 via TOPICAL

## 2020-04-14 NOTE — Op Note (Signed)
Hector Lopez, [facility] MEDICAL RECORD QB:[id number] ACCOUNT [id number] DATE OF BIRTH:05-25-1946 FACILITY: [facility] LOCATION: [facility] PHYSICIAN:Signora Zucco Tresa Moore, MD  OPERATIVE REPORT  DATE OF PROCEDURE:  04/13/2020  SURGEON:  Alexis Frock, MD  PREOPERATIVE DIAGNOSES:  Prostatic hypertrophy with urinary retention.  PROCEDURE:  Transurethral resection of the prostate.  ESTIMATED BLOOD LOSS:  50 mL.  COMPLICATIONS:  None.  SPECIMENS:  Prostate chips for permanent pathology.  FINDINGS:   1.  Bilobar prostatic hypertrophy pre-resection. 2.  Wide urinary channel from the bladder neck to verumontanum post-resection.  DRAINS:  A 24-French 3-way Foley catheter to normal saline irrigation, efflux light pink.  INDICATIONS:  The patient is a pleasant but unfortunate 74 year old man with recent history of metastatic urothelial carcinoma, status post nephroureterectomy on the right.  He did well from this  perioperatively, however, he developed urinary retention  afterwards.  He was placed on medical therapy with alpha blockers, 5 alpha reductace inhibitors; however, he failed to pass trial of void times several.  Cystoscopic examination revealed bilobar prostatic hypertrophy.  Urodynamics corroborated an obstructive picture  with high-pressure low-flow.  Options were discussed for management including recommended path of transurethral resection of the prostate given his prostate size is approximately 70 g.  Informed consent was obtained and placed in the medical record.  DESCRIPTION OF PROCEDURE:  The patient being identified, the procedure being transurethral resection of prostate was confirmed.  Procedure timeout was performed.  Intravenous antibiotics administered.  General LMA anesthesia induced.  The patient was  placed into a low lithotomy position, sterile field was created, prepping and draping base of the penis, perineum and proximal thighs using iodine.  Cystourethroscopy was performed using  26-French resectoscope sheath with visual obturator.  Inspection of  anterior and posterior urethra revealed some bilobar prostatic hypertrophy.  Right ureteral orifice was surgically absent.  Bladder was mildly trabeculated.  Using the resectoscope loop, transurethral resection was performed of the prostate from the  bladder neck towards the area of the verumontanum in a top-down orientation, first at the 12 o'clock position, then of the right lobe from the 12 o'clock to 6 o'clock position, down to what appeared to be the superficial fibromuscular stroma of the  prostate capsule.  Exquisite care was taken to avoid undermining of the bladder neck and finally the left lobe of the prostate was resected in a similar fashion.  The left lobe was somewhat larger than the right.  Prostate chips were irrigated and set  aside for permanent pathology.  Bladder carefully inspected.  Ureteral orifices were uninjured.  All prostate chips had been removed.  The base of the resection area was fulgurated circumferentially.  Hemostasis appeared excellent.  Resectoscope was  exchanged for new 24-French 3-way Foley catheter over a Sensor working wire in [employer] fashion, 30 mL sterile water in the balloon.  This was connected to normal saline irrigation with efflux light pink, and the procedure was terminated.  The patient  tolerated the procedure well.  No immediate complications.  The patient was taken to postanesthesia care in stable condition.  Plan for observation admission overnight, likely discharge tomorrow.  VN/NUANCE  D:04/13/2020 T:04/13/2020 JOB:[id number]

## 2020-04-14 NOTE — Discharge Summary (Signed)
Date of admission: 04/13/2020  Date of discharge: 04/14/2020  Admission diagnosis: bladder outlet obstruction/urianry retention  Discharge diagnosis: same  Secondary diagnoses:  Patient Active Problem List   Diagnosis Date Noted   Prostatic hyperplasia 04/13/2020   Ureteral neoplasm 02/10/2020    Procedures performed: Procedure(s): TRANSURETHRAL RESECTION OF THE PROSTATE (TURP)  History and Physical: For full details, please see admission history and physical. Briefly, Gaelen Brager is a 74 y.o. year old patient with chronic urinary retention.   Hospital Course: Patient tolerated the procedure well.  He was then transferred to the floor after an uneventful PACU stay.  His hospital course was uncomplicated.  On POD#1 he had met discharge criteria: was eating a regular diet, was up and ambulating independently,  pain was well controlled,  and was ready to for discharge.  Urine clear on slow CBI.  CBI stopped and patient sent home with catheter.   Laboratory values:  Recent Labs    04/13/20 1210 04/14/20 0606  HGB 10.6* 10.2*  HCT 33.7* 32.2*   No results for input(s): NA, K, CL, CO2, GLUCOSE, BUN, CREATININE, CALCIUM in the last 72 hours. No results for input(s): LABPT, INR in the last 72 hours. No results for input(s): LABURIN in the last 72 hours. Results for orders placed or performed during the hospital encounter of 04/11/20  SARS CORONAVIRUS 2 (TAT 6-24 HRS) Nasopharyngeal Nasopharyngeal Swab     Status: None   Collection Time: 04/11/20 10:17 AM   Specimen: Nasopharyngeal Swab  Result Value Ref Range Status   SARS Coronavirus 2 NEGATIVE NEGATIVE Final    Comment: (NOTE) SARS-CoV-2 target nucleic acids are NOT DETECTED.  The SARS-CoV-2 RNA is generally detectable in upper and lower respiratory specimens during the acute phase of infection. Negative results do not preclude SARS-CoV-2 infection, do not rule out co-infections with other pathogens, and should not be used  as the sole basis for treatment or other patient management decisions. Negative results must be combined with clinical observations, patient history, and epidemiological information. The expected result is Negative.  Fact Sheet for Patients: SugarRoll.be  Fact Sheet for Healthcare Providers: https://www.woods-mathews.com/  This test is not yet approved or cleared by the [place] FDA and  has been authorized for detection and/or diagnosis of SARS-CoV-2 by FDA under an Emergency Use Authorization (EUA). This EUA will remain  in effect (meaning this test can be used) for the duration of the COVID-19 declaration under Se ction 564(b)(1) of the Act, 21 U.S.C. section 360bbb-3(b)(1), unless the authorization is terminated or revoked sooner.  Performed at [facility], [place] [address] [place], [place] [address]     Disposition: Home  Discharge instruction: The patient was instructed to be ambulatory but told to refrain from heavy lifting, strenuous activity, or driving.   Discharge medications:  Allergies as of 04/14/2020      Reactions   Codeine Nausea And Vomiting      Medication List    STOP taking these medications   tamsulosin 0.4 MG Caps capsule Commonly known as: FLOMAX     TAKE these medications   amLODipine 10 MG tablet Commonly known as: NORVASC Take 10 mg by mouth at bedtime.   cetirizine 10 MG tablet Commonly known as: ZYRTEC Take 10 mg by mouth daily.   finasteride 5 MG tablet Commonly known as: PROSCAR Take 5 mg by mouth daily.   glipiZIDE 10 MG tablet Commonly known as: GLUCOTROL Take 10 mg by mouth daily before breakfast.  lisinopril 40 MG tablet Commonly known as: ZESTRIL Take 40 mg by mouth at bedtime.   metFORMIN 1000 MG tablet Commonly known as: GLUCOPHAGE Take 1,000 mg by mouth 2 (two) times daily with a meal.   omeprazole 40 MG capsule Commonly known as: PRILOSEC Take 40 mg by mouth  daily.   oxyCODONE-acetaminophen 5-325 MG tablet Commonly known as: Percocet Take 1 tablet by mouth every 6 (six) hours as needed for moderate pain or severe pain. Post-operatively What changed: how much to take   senna-docusate 8.6-50 MG tablet Commonly known as: Senokot-S Take 1 tablet by mouth 2 (two) times daily. While taking strong pain meds to prevent cosntipation.   sertraline 50 MG tablet Commonly known as: ZOLOFT Take 50 mg by mouth daily.   simvastatin 20 MG tablet Commonly known as: ZOCOR Take 20 mg by mouth daily.   sulfamethoxazole-trimethoprim 800-160 MG tablet Commonly known as: BACTRIM DS Take 1 tablet by mouth daily. Start taking one day prior to your appointment for your first follow-up and catheter removal.  Continue taking for three days. What changed: when to take this       Followup:   Follow-up Information    [facility] On 04/16/2020.   Why: at 12:45 for nurse practitioner vsit and catheter removal.  Contact information: [place] [phone]

## 2020-04-16 ENCOUNTER — Encounter (HOSPITAL_COMMUNITY): Payer: Self-pay | Admitting: Urology

## 2020-04-16 LAB — SURGICAL PATHOLOGY

## 2020-04-20 DIAGNOSIS — I10 Essential (primary) hypertension: Secondary | ICD-10-CM | POA: Diagnosis not present

## 2020-04-20 DIAGNOSIS — E1169 Type 2 diabetes mellitus with other specified complication: Secondary | ICD-10-CM | POA: Diagnosis not present

## 2020-04-20 DIAGNOSIS — K219 Gastro-esophageal reflux disease without esophagitis: Secondary | ICD-10-CM | POA: Diagnosis not present

## 2020-04-20 DIAGNOSIS — E1165 Type 2 diabetes mellitus with hyperglycemia: Secondary | ICD-10-CM | POA: Diagnosis not present

## 2020-04-25 DIAGNOSIS — Z6824 Body mass index (BMI) 24.0-24.9, adult: Secondary | ICD-10-CM | POA: Diagnosis not present

## 2020-04-25 DIAGNOSIS — E1169 Type 2 diabetes mellitus with other specified complication: Secondary | ICD-10-CM | POA: Diagnosis not present

## 2020-04-25 DIAGNOSIS — E782 Mixed hyperlipidemia: Secondary | ICD-10-CM | POA: Diagnosis not present

## 2020-04-25 DIAGNOSIS — C661 Malignant neoplasm of right ureter: Secondary | ICD-10-CM | POA: Diagnosis not present

## 2020-04-25 DIAGNOSIS — I10 Essential (primary) hypertension: Secondary | ICD-10-CM | POA: Diagnosis not present

## 2020-04-25 DIAGNOSIS — R339 Retention of urine, unspecified: Secondary | ICD-10-CM | POA: Diagnosis not present

## 2020-04-25 DIAGNOSIS — N471 Phimosis: Secondary | ICD-10-CM | POA: Diagnosis not present

## 2020-04-25 DIAGNOSIS — R944 Abnormal results of kidney function studies: Secondary | ICD-10-CM | POA: Diagnosis not present

## 2020-05-02 NOTE — Anesthesia Postprocedure Evaluation (Signed)
Anesthesia Post Note  Patient: An Schnabel  Procedure(s) Performed: TRANSURETHRAL RESECTION OF THE PROSTATE (TURP) (N/A Prostate)     Patient location during evaluation: PACU Anesthesia Type: General Level of consciousness: awake and alert Pain management: pain level controlled Vital Signs Assessment: post-procedure vital signs reviewed and stable Respiratory status: spontaneous breathing, nonlabored ventilation, respiratory function stable and patient connected to nasal cannula oxygen Cardiovascular status: blood pressure returned to baseline and stable Postop Assessment: no apparent nausea or vomiting Anesthetic complications: no   No complications documented.  Last Vitals:  Vitals:   04/14/20 0510 04/14/20 0847  BP: 124/71 123/68  Pulse: 76 68  Resp: 18 19  Temp: 36.9 C 36.6 C  SpO2: 98% 100%    Last Pain:  Vitals:   04/14/20 1010  TempSrc:   PainSc: 0-No pain                 Khari Mally

## 2020-05-18 DIAGNOSIS — C651 Malignant neoplasm of right renal pelvis: Secondary | ICD-10-CM | POA: Diagnosis not present

## 2020-05-18 DIAGNOSIS — K8020 Calculus of gallbladder without cholecystitis without obstruction: Secondary | ICD-10-CM | POA: Diagnosis not present

## 2020-05-22 DIAGNOSIS — C651 Malignant neoplasm of right renal pelvis: Secondary | ICD-10-CM | POA: Diagnosis not present

## 2020-05-22 DIAGNOSIS — C778 Secondary and unspecified malignant neoplasm of lymph nodes of multiple regions: Secondary | ICD-10-CM | POA: Diagnosis not present

## 2020-06-01 DIAGNOSIS — H25812 Combined forms of age-related cataract, left eye: Secondary | ICD-10-CM | POA: Diagnosis not present

## 2020-06-01 DIAGNOSIS — Z970 Presence of artificial eye: Secondary | ICD-10-CM | POA: Diagnosis not present

## 2020-06-01 DIAGNOSIS — E119 Type 2 diabetes mellitus without complications: Secondary | ICD-10-CM | POA: Diagnosis not present

## 2020-06-01 DIAGNOSIS — H401121 Primary open-angle glaucoma, left eye, mild stage: Secondary | ICD-10-CM | POA: Diagnosis not present

## 2020-06-14 ENCOUNTER — Telehealth: Payer: Self-pay | Admitting: Oncology

## 2020-06-14 NOTE — Telephone Encounter (Signed)
Received a new pt referral from Dr. Tresa Moore at [facility] for metastatic urothelial carcinoma. Hector Lopez has been cld and scheduled to see Dr. Alen Blew on 10/6 at 11am. Pt aware to arrive 30 minutes early.

## 2020-06-15 DIAGNOSIS — H25812 Combined forms of age-related cataract, left eye: Secondary | ICD-10-CM | POA: Diagnosis not present

## 2020-06-15 DIAGNOSIS — H401121 Primary open-angle glaucoma, left eye, mild stage: Secondary | ICD-10-CM | POA: Diagnosis not present

## 2020-06-20 ENCOUNTER — Other Ambulatory Visit: Payer: Self-pay

## 2020-06-20 ENCOUNTER — Inpatient Hospital Stay: Payer: Medicare Other | Attending: Oncology | Admitting: Oncology

## 2020-06-20 VITALS — BP 139/70 | HR 64 | Temp 97.0°F | Resp 18 | Wt 177.4 lb

## 2020-06-20 DIAGNOSIS — E119 Type 2 diabetes mellitus without complications: Secondary | ICD-10-CM

## 2020-06-20 DIAGNOSIS — Z9079 Acquired absence of other genital organ(s): Secondary | ICD-10-CM | POA: Diagnosis not present

## 2020-06-20 DIAGNOSIS — K219 Gastro-esophageal reflux disease without esophagitis: Secondary | ICD-10-CM | POA: Diagnosis not present

## 2020-06-20 DIAGNOSIS — E782 Mixed hyperlipidemia: Secondary | ICD-10-CM | POA: Diagnosis not present

## 2020-06-20 DIAGNOSIS — Z9001 Acquired absence of eye: Secondary | ICD-10-CM

## 2020-06-20 DIAGNOSIS — Z5111 Encounter for antineoplastic chemotherapy: Secondary | ICD-10-CM | POA: Insufficient documentation

## 2020-06-20 DIAGNOSIS — I10 Essential (primary) hypertension: Secondary | ICD-10-CM

## 2020-06-20 DIAGNOSIS — Z7984 Long term (current) use of oral hypoglycemic drugs: Secondary | ICD-10-CM

## 2020-06-20 DIAGNOSIS — C680 Malignant neoplasm of urethra: Secondary | ICD-10-CM | POA: Diagnosis not present

## 2020-06-20 DIAGNOSIS — Z79899 Other long term (current) drug therapy: Secondary | ICD-10-CM

## 2020-06-20 DIAGNOSIS — D4959 Neoplasm of unspecified behavior of other genitourinary organ: Secondary | ICD-10-CM

## 2020-06-20 DIAGNOSIS — N401 Enlarged prostate with lower urinary tract symptoms: Secondary | ICD-10-CM

## 2020-06-20 DIAGNOSIS — N289 Disorder of kidney and ureter, unspecified: Secondary | ICD-10-CM | POA: Diagnosis not present

## 2020-06-20 MED ORDER — PROCHLORPERAZINE MALEATE 10 MG PO TABS
10.0000 mg | ORAL_TABLET | Freq: Four times a day (QID) | ORAL | 0 refills | Status: DC | PRN
Start: 2020-06-20 — End: 2021-10-17

## 2020-06-20 MED ORDER — LIDOCAINE-PRILOCAINE 2.5-2.5 % EX CREA
1.0000 | TOPICAL_CREAM | CUTANEOUS | 0 refills | Status: DC | PRN
Start: 2020-06-20 — End: 2022-06-14

## 2020-06-20 NOTE — Progress Notes (Signed)
START ON PATHWAY REGIMEN - Bladder     A cycle is every 21 days:     Carboplatin      Gemcitabine   **Always confirm dose/schedule in your pharmacy ordering system**  Patient Characteristics: Advanced/Metastatic Disease, First Line, No Prior Platinum-Based Therapy, Poor Renal Function (CrCl < 50 mL/min), Unknown PD-L1 Expression Therapeutic Status: Advanced/Metastatic Disease Line of Therapy: First Line Prior Platinum-Based Therapy<= No Renal Function: Poor Renal Function (CrCl < 50 mL/min) PD-L1 Expression Status: Unknown PD-L1 Expression Intent of Therapy: Curative Intent, Discussed with Patient
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
## 2020-06-22 ENCOUNTER — Telehealth: Payer: Self-pay | Admitting: Oncology

## 2020-06-22 NOTE — Telephone Encounter (Signed)
Scheduled per los, patient has been called and notified. 

## 2020-06-25 ENCOUNTER — Other Ambulatory Visit: Payer: Self-pay | Admitting: Radiology

## 2020-06-27 ENCOUNTER — Ambulatory Visit (HOSPITAL_COMMUNITY)
Admission: RE | Admit: 2020-06-27 | Discharge: 2020-06-27 | Disposition: A | Discharge status: Home / Self Care | Payer: Medicare Other | Source: Ambulatory Visit | Attending: Oncology | Admitting: Oncology

## 2020-06-27 ENCOUNTER — Inpatient Hospital Stay: Payer: Medicare Other

## 2020-06-27 ENCOUNTER — Other Ambulatory Visit: Payer: Self-pay

## 2020-06-27 ENCOUNTER — Other Ambulatory Visit: Payer: Self-pay | Admitting: Oncology

## 2020-06-27 ENCOUNTER — Encounter (HOSPITAL_COMMUNITY): Payer: Self-pay

## 2020-06-27 DIAGNOSIS — Z79899 Other long term (current) drug therapy: Secondary | ICD-10-CM | POA: Diagnosis not present

## 2020-06-27 DIAGNOSIS — D4959 Neoplasm of unspecified behavior of other genitourinary organ: Secondary | ICD-10-CM

## 2020-06-27 DIAGNOSIS — E119 Type 2 diabetes mellitus without complications: Secondary | ICD-10-CM | POA: Diagnosis not present

## 2020-06-27 DIAGNOSIS — C689 Malignant neoplasm of urinary organ, unspecified: Secondary | ICD-10-CM | POA: Insufficient documentation

## 2020-06-27 DIAGNOSIS — C679 Malignant neoplasm of bladder, unspecified: Secondary | ICD-10-CM | POA: Diagnosis not present

## 2020-06-27 DIAGNOSIS — E782 Mixed hyperlipidemia: Secondary | ICD-10-CM | POA: Insufficient documentation

## 2020-06-27 DIAGNOSIS — Z7984 Long term (current) use of oral hypoglycemic drugs: Secondary | ICD-10-CM | POA: Diagnosis not present

## 2020-06-27 DIAGNOSIS — K219 Gastro-esophageal reflux disease without esophagitis: Secondary | ICD-10-CM | POA: Diagnosis not present

## 2020-06-27 DIAGNOSIS — Z452 Encounter for adjustment and management of vascular access device: Secondary | ICD-10-CM | POA: Diagnosis not present

## 2020-06-27 DIAGNOSIS — I10 Essential (primary) hypertension: Secondary | ICD-10-CM | POA: Insufficient documentation

## 2020-06-27 HISTORY — PX: IR IMAGING GUIDED PORT INSERTION: IMG5740

## 2020-06-27 LAB — CBC WITH DIFFERENTIAL/PLATELET
Abs Immature Granulocytes: 0.01 10*3/uL (ref 0.00–0.07)
Basophils Absolute: 0 10*3/uL (ref 0.0–0.1)
Basophils Relative: 1 %
Eosinophils Absolute: 0.3 10*3/uL (ref 0.0–0.5)
Eosinophils Relative: 5 %
HCT: 35.9 % — ABNORMAL LOW (ref 39.0–52.0)
Hemoglobin: 11.8 g/dL — ABNORMAL LOW (ref 13.0–17.0)
Immature Granulocytes: 0 %
Lymphocytes Relative: 22 %
Lymphs Abs: 1.2 10*3/uL (ref 0.7–4.0)
MCH: 28.4 pg (ref 26.0–34.0)
MCHC: 32.9 g/dL (ref 30.0–36.0)
MCV: 86.5 fL (ref 80.0–100.0)
Monocytes Absolute: 0.4 10*3/uL (ref 0.1–1.0)
Monocytes Relative: 7 %
Neutro Abs: 3.8 10*3/uL (ref 1.7–7.7)
Neutrophils Relative %: 65 %
Platelets: 200 10*3/uL (ref 150–400)
RBC: 4.15 MIL/uL — ABNORMAL LOW (ref 4.22–5.81)
RDW: 14.2 % (ref 11.5–15.5)
WBC: 5.7 10*3/uL (ref 4.0–10.5)
nRBC: 0 % (ref 0.0–0.2)

## 2020-06-27 LAB — GLUCOSE, CAPILLARY: Glucose-Capillary: 127 mg/dL — ABNORMAL HIGH (ref 70–99)

## 2020-06-27 LAB — BASIC METABOLIC PANEL
Anion gap: 12 (ref 5–15)
BUN: 21 mg/dL (ref 8–23)
CO2: 22 mmol/L (ref 22–32)
Calcium: 9.7 mg/dL (ref 8.9–10.3)
Chloride: 107 mmol/L (ref 98–111)
Creatinine, Ser: 1.17 mg/dL (ref 0.61–1.24)
GFR, Estimated: >60 mL/min (ref >60)
Glucose, Bld: 127 mg/dL — ABNORMAL HIGH (ref 70–99)
Potassium: 5.1 mmol/L (ref 3.5–5.1)
Sodium: 141 mmol/L (ref 135–145)

## 2020-06-27 LAB — PROTIME-INR
INR: 1 (ref 0.8–1.2)
Prothrombin Time: 13 seconds (ref 11.4–15.2)

## 2020-06-27 IMAGING — XA IR IMAGING GUIDED PORT INSERTION
2 series · 3 of 3 positions shown · non-contrast
Comparison: None.

INDICATION: 73-year-old with urothelial carcinoma. Port-A-Cath needed for
treatment.

EXAM:
FLUOROSCOPIC AND ULTRASOUND GUIDED PLACEMENT OF A SUBCUTANEOUS PORT

[Series 1: (id) · 2 of 2 slices shown]
[im 1/2]
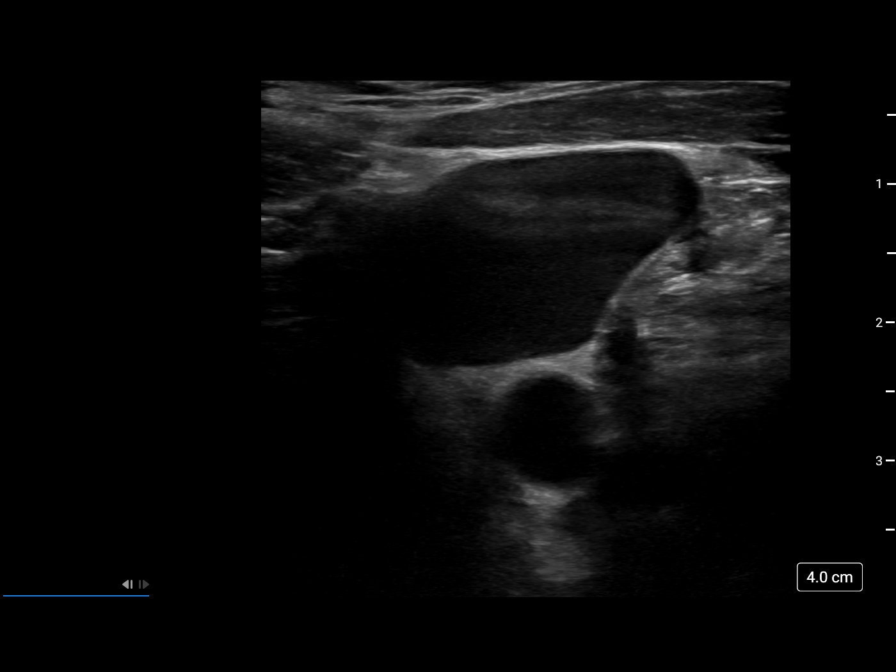
[im 2/2]
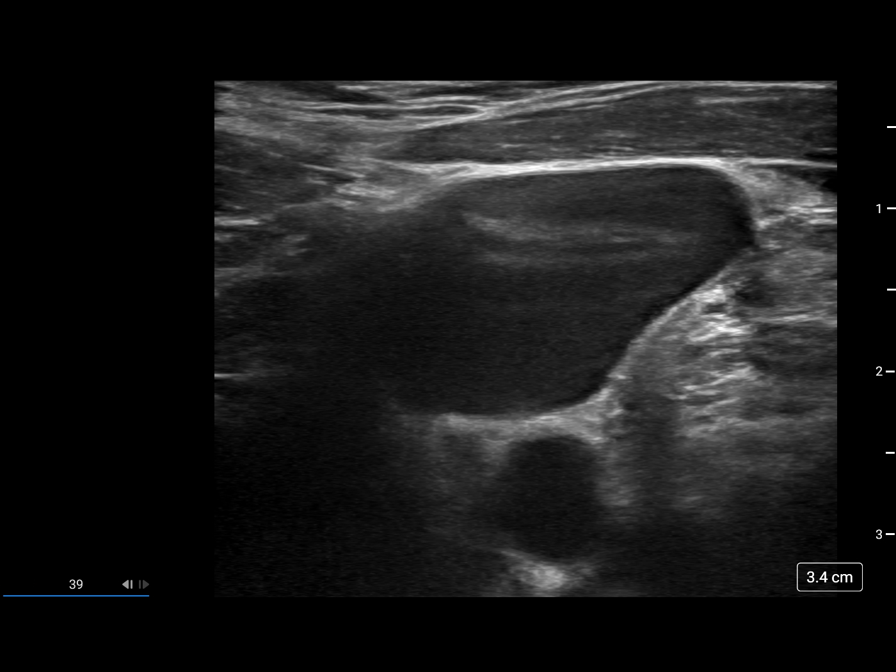

[Series 1: care single · 1 of 1 slices shown]
[im 1/1]
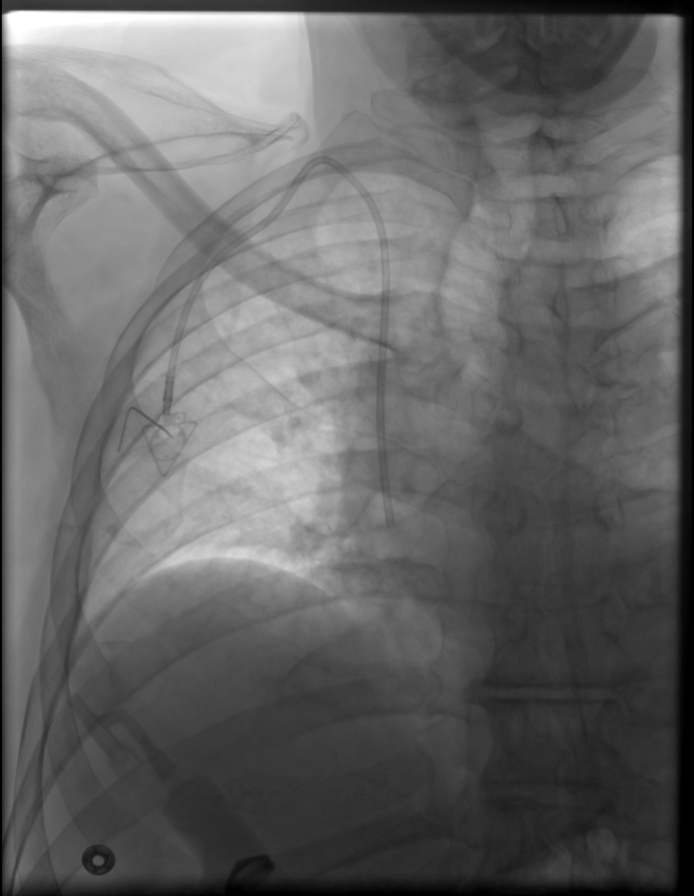

[3 of 3 positions shown; findings below may reference images not displayed]

MEDICATIONS:
Ancef 2 g; The antibiotic was administered within an appropriate
time interval prior to skin puncture.

ANESTHESIA/SEDATION:
Versed 4.0 mg IV; Fentanyl 100 mcg IV;

Moderate Sedation Time:  30 minutes

The patient was continuously monitored during the procedure by the
interventional radiology nurse under my direct supervision.

FLUOROSCOPY TIME:  24 seconds, 6 mGy

COMPLICATIONS:
None immediate.

PROCEDURE:
The procedure, risks, benefits, and alternatives were explained to
the patient. Questions regarding the procedure were encouraged and
answered. The patient understands and consents to the procedure.

Patient was placed supine on the interventional table. Ultrasound
confirmed a patent right internal jugular vein. Ultrasound image was
saved for documentation. The right chest and neck were cleaned with
a skin antiseptic and a sterile drape was placed. Maximal barrier
sterile technique was utilized including caps, mask, sterile gowns,
sterile gloves, sterile drape, hand hygiene and skin antiseptic. The
right neck was anesthetized with 1% lidocaine. Small incision was
made in the right neck with a blade. Micropuncture set was placed in
the right internal jugular vein with ultrasound guidance. The
micropuncture wire was used for measurement purposes. The right
chest was anesthetized with 1% lidocaine with epinephrine. #15 blade
was used to make an incision and a subcutaneous port pocket was
formed. 8 french Power Port was assembled. Subcutaneous tunnel was
formed with a stiff tunneling device. The port catheter was brought
through the subcutaneous tunnel. The port was placed in the
subcutaneous pocket. The micropuncture set was exchanged for a
peel-away sheath. The catheter was placed through the peel-away
sheath and the tip was positioned at the superior cavoatrial
junction. Catheter placement was confirmed with fluoroscopy. The
port was accessed and flushed with heparinized saline. The port
pocket was closed using two layers of absorbable sutures and
Dermabond. The vein skin site was closed using a single layer of
absorbable suture and Dermabond. Sterile dressings were applied.
Patient tolerated the procedure well without an immediate
complication. Ultrasound and fluoroscopic images were taken and
saved for this procedure.
IMPRESSION: Placement of a subcutaneous port device. Catheter tip at the
superior cavoatrial junction.

## 2020-06-27 MED ORDER — LIDOCAINE-EPINEPHRINE 1 %-1:100000 IJ SOLN
INTRAMUSCULAR | Status: AC | PRN
  Administered 2020-06-27: 10 mL

## 2020-06-27 MED ORDER — SODIUM CHLORIDE 0.9 % IV SOLN: INTRAVENOUS | Status: DC

## 2020-06-27 MED ORDER — MIDAZOLAM HCL 2 MG/2ML IJ SOLN
INTRAMUSCULAR | Status: AC | PRN
  Administered 2020-06-27: 1 mg via INTRAVENOUS
  Administered 2020-06-27: 2 mg via INTRAVENOUS
  Administered 2020-06-27: 1 mg via INTRAVENOUS

## 2020-06-27 MED ORDER — FENTANYL CITRATE (PF) 100 MCG/2ML IJ SOLN
INTRAMUSCULAR | Status: DC
Start: 2020-06-27 — End: 2020-06-28
  Filled 2020-06-27: qty 2

## 2020-06-27 MED ORDER — LIDOCAINE-EPINEPHRINE 1 %-1:100000 IJ SOLN
INTRAMUSCULAR | Status: AC
  Filled 2020-06-27: qty 1

## 2020-06-27 MED ORDER — CEFAZOLIN SODIUM-DEXTROSE 2-4 GM/100ML-% IV SOLN: 2.0000 g | INTRAVENOUS | Status: AC

## 2020-06-27 MED ORDER — FENTANYL CITRATE (PF) 100 MCG/2ML IJ SOLN
INTRAMUSCULAR | Status: AC | PRN
  Administered 2020-06-27 (×2): 50 ug via INTRAVENOUS

## 2020-06-27 MED ORDER — HEPARIN SOD (PORK) LOCK FLUSH 100 UNIT/ML IV SOLN
INTRAVENOUS | Status: AC
  Filled 2020-06-27: qty 5

## 2020-06-27 MED ORDER — CEFAZOLIN SODIUM-DEXTROSE 2-4 GM/100ML-% IV SOLN
INTRAVENOUS | Status: AC
  Administered 2020-06-27: 2 g via INTRAVENOUS
  Filled 2020-06-27: qty 100

## 2020-06-27 MED ORDER — MIDAZOLAM HCL 2 MG/2ML IJ SOLN
INTRAMUSCULAR | Status: AC
  Filled 2020-06-27: qty 4

## 2020-06-27 MED ORDER — HEPARIN SOD (PORK) LOCK FLUSH 100 UNIT/ML IV SOLN
INTRAVENOUS | Status: AC | PRN
  Administered 2020-06-27: 500 [IU] via INTRAVENOUS

## 2020-06-27 MED ORDER — LIDOCAINE HCL 1 % IJ SOLN
INTRAMUSCULAR | Status: AC
  Filled 2020-06-27: qty 20

## 2020-06-27 NOTE — Procedures (Signed)
Interventional Radiology Procedure:   Indications: High grade papillary urothelial carcinoma  Procedure: Port placement  Findings: Right jugular port, tip at SVC/RA junction  Complications: None     EBL: Minimal, less than 10 ml  Plan: Discharge in one hour.  Keep port site and incisions dry for at least 24 hours.     Hector Lopez R. Anselm Pancoast, MD  Pager: [phone]
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
## 2020-06-27 NOTE — Discharge Instructions (Signed)
Implanted Port Insertion, Care After This sheet gives you information about how to care for yourself after your procedure. Your health care provider may also give you more specific instructions. If you have problems or questions, contact your health care provider. What can I expect after the procedure? After the procedure, it is common to have:  Discomfort at the port insertion site.  Bruising on the skin over the port. This should improve over 3-4 days. Follow these instructions at home: Port care  After your port is placed, you will get a manufacturer's information card. The card has information about your port. Keep this card with you at all times.  Take care of the port as told by your health care provider. Ask your health care provider if you or a family member can get training for taking care of the port at home. A home health care nurse may also take care of the port.  Make sure to remember what type of port you have. Incision care      Follow instructions from your health care provider about how to take care of your port insertion site. Make sure you: ? Wash your hands with soap and water before and after you change your bandage (dressing). If soap and water are not available, use hand sanitizer. ? Change your dressing as told by your health care provider. ? Leave stitches (sutures), skin glue, or adhesive strips in place. These skin closures may need to stay in place for 2 weeks or longer. If adhesive strip edges start to loosen and curl up, you may trim the loose edges. Do not remove adhesive strips completely unless your health care provider tells you to do that.  Check your port insertion site every day for signs of infection. Check for: ? Redness, swelling, or pain. ? Fluid or blood. ? Warmth. ? Pus or a bad smell. Activity  Return to your normal activities as told by your health care provider. Ask your health care provider what activities are safe for you.  Do not  lift anything that is heavier than 10 lb (4.5 kg), or the limit that you are told, until your health care provider says that it is safe. General instructions  Take over-the-counter and prescription medicines only as told by your health care provider.  Do not take baths, swim, or use a hot tub until your health care provider approves. Ask your health care provider if you may take showers. You may only be allowed to take sponge baths.  Do not drive for 24 hours if you were given a sedative during your procedure.  Wear a medical alert bracelet in case of an emergency. This will tell any health care providers that you have a port.  Keep all follow-up visits as told by your health care provider. This is important. Contact a health care provider if:  You cannot flush your port with saline as directed, or you cannot draw blood from the port.  You have a fever or chills.  You have redness, swelling, or pain around your port insertion site.  You have fluid or blood coming from your port insertion site.  Your port insertion site feels warm to the touch.  You have pus or a bad smell coming from the port insertion site. Get help right away if:  You have chest pain or shortness of breath.  You have bleeding from your port that you cannot control. Summary  Take care of the port as told by your health   care provider. Keep the manufacturer's information card with you at all times.  Change your dressing as told by your health care provider.  Contact a health care provider if you have a fever or chills or if you have redness, swelling, or pain around your port insertion site.  Keep all follow-up visits as told by your health care provider. This information is not intended to replace advice given to you by your health care provider. Make sure you discuss any questions you have with your health care provider. Document Revised: 03/30/2018 Document Reviewed: 03/30/2018 Elsevier Patient Education   [place]. Moderate Conscious Sedation, Adult, Care After These instructions provide you with information about caring for yourself after your procedure. Your health care provider may also give you more specific instructions. Your treatment has been planned according to current medical practices, but problems sometimes occur. Call your health care provider if you have any problems or questions after your procedure. What can I expect after the procedure? After your procedure, it is common:  To feel sleepy for several hours.  To feel clumsy and have poor balance for several hours.  To have poor judgment for several hours.  To vomit if you eat too soon. Follow these instructions at home: For at least 24 hours after the procedure:   Do not: ? Participate in activities where you could fall or become injured. ? Drive. ? Use heavy machinery. ? Drink alcohol. ? Take sleeping pills or medicines that cause drowsiness. ? Make important decisions or sign legal documents. ? Take care of children on your own.  Rest. Eating and drinking  Follow the diet recommended by your health care provider.  If you vomit: ? Drink water, juice, or soup when you can drink without vomiting. ? Make sure you have little or no nausea before eating solid foods. General instructions  Have a responsible adult stay with you until you are awake and alert.  Take over-the-counter and prescription medicines only as told by your health care provider.  If you smoke, do not smoke without supervision.  Keep all follow-up visits as told by your health care provider. This is important. Contact a health care provider if:  You keep feeling nauseous or you keep vomiting.  You feel light-headed.  You develop a rash.  You have a fever. Get help right away if:  You have trouble breathing. This information is not intended to replace advice given to you by your health care provider. Make sure you discuss any  questions you have with your health care provider. Document Revised: 08/14/2017 Document Reviewed: 12/22/2015 Elsevier Patient Education  2020 [place] may remove both dressings after 4:00 pm on Thursday, shower, pat the are dry when you get out ( it will be bruised and swollen, you may use an ice pack to help with swelling and discomfort) you do not have to put anything back over the site. The skin glue -takes about 2 weeks to come off, you can not you the elma cream or numbing medication until after the skin glue has come completely off. Recommend a button up shirt to wear when you go to chemo.

## 2020-06-28 NOTE — Progress Notes (Signed)
Pharmacist Chemotherapy Monitoring - Initial Assessment    Anticipated start date: 07/04/20   Regimen:  . Are orders appropriate based on the patient's diagnosis, regimen, and cycle? Yes . Does the plan date match the patient's scheduled date? Yes . Is the sequencing of drugs appropriate? Yes . Are the premedications appropriate for the patient's regimen? Yes . Prior Authorization for treatment is: Pending o If applicable, is the correct biosimilar selected based on the patient's insurance? not applicable  Organ Function and Labs: Marland Kitchen Are dose adjustments needed based on the patient's renal function, hepatic function, or hematologic function? No . Are appropriate labs ordered prior to the start of patient's treatment? Yes . Other organ system assessment, if indicated: N/A . The following baseline labs, if indicated, have been ordered: N/A  Dose Assessment: . Are the drug doses appropriate? Yes . Are the following correct: o Drug concentrations Yes o IV fluid compatible with drug Yes o Administration routes Yes o Timing of therapy Yes . If applicable, does the patient have documented access for treatment and/or plans for port-a-cath placement? not applicable . If applicable, have lifetime cumulative doses been properly documented and assessed? not applicable Lifetime Dose Tracking  No doses have been documented on this patient for the following tracked chemicals: Doxorubicin, Epirubicin, Idarubicin, Daunorubicin, Mitoxantrone, Bleomycin, Oxaliplatin, Carboplatin, Liposomal Doxorubicin  o   Toxicity Monitoring/Prevention: . The patient has the following take home antiemetics prescribed: N/A . The patient has the following take home medications prescribed: N/A . Medication allergies and previous infusion related reactions, if applicable, have been reviewed and addressed. Yes . The patient's current medication list has been assessed for drug-drug interactions with their chemotherapy  regimen. no significant drug-drug interactions were identified on review.  Order Review: . Are the treatment plan orders signed? Yes . Is the patient scheduled to see a provider prior to their treatment? Yes  I verify that I have reviewed each item in the above checklist and answered each question accordingly.  Jp Eastham K 06/28/2020 1:26 PM

## 2020-07-04 ENCOUNTER — Telehealth: Payer: Self-pay | Admitting: Oncology

## 2020-07-04 ENCOUNTER — Inpatient Hospital Stay: Payer: Medicare Other

## 2020-07-04 ENCOUNTER — Other Ambulatory Visit: Payer: Self-pay

## 2020-07-04 VITALS — BP 146/86 | HR 68 | Temp 98.8°F | Resp 18 | Wt 176.0 lb

## 2020-07-04 DIAGNOSIS — I10 Essential (primary) hypertension: Secondary | ICD-10-CM | POA: Diagnosis not present

## 2020-07-04 DIAGNOSIS — Z95828 Presence of other vascular implants and grafts: Secondary | ICD-10-CM

## 2020-07-04 DIAGNOSIS — C680 Malignant neoplasm of urethra: Secondary | ICD-10-CM | POA: Diagnosis not present

## 2020-07-04 DIAGNOSIS — N401 Enlarged prostate with lower urinary tract symptoms: Secondary | ICD-10-CM | POA: Diagnosis not present

## 2020-07-04 DIAGNOSIS — E119 Type 2 diabetes mellitus without complications: Secondary | ICD-10-CM | POA: Diagnosis not present

## 2020-07-04 DIAGNOSIS — E782 Mixed hyperlipidemia: Secondary | ICD-10-CM | POA: Diagnosis not present

## 2020-07-04 DIAGNOSIS — N289 Disorder of kidney and ureter, unspecified: Secondary | ICD-10-CM | POA: Diagnosis not present

## 2020-07-04 DIAGNOSIS — K219 Gastro-esophageal reflux disease without esophagitis: Secondary | ICD-10-CM | POA: Diagnosis not present

## 2020-07-04 DIAGNOSIS — D4959 Neoplasm of unspecified behavior of other genitourinary organ: Secondary | ICD-10-CM

## 2020-07-04 DIAGNOSIS — Z9079 Acquired absence of other genital organ(s): Secondary | ICD-10-CM | POA: Diagnosis not present

## 2020-07-04 DIAGNOSIS — Z9001 Acquired absence of eye: Secondary | ICD-10-CM | POA: Diagnosis not present

## 2020-07-04 DIAGNOSIS — Z5111 Encounter for antineoplastic chemotherapy: Secondary | ICD-10-CM | POA: Diagnosis not present

## 2020-07-04 DIAGNOSIS — Z7984 Long term (current) use of oral hypoglycemic drugs: Secondary | ICD-10-CM | POA: Diagnosis not present

## 2020-07-04 DIAGNOSIS — Z79899 Other long term (current) drug therapy: Secondary | ICD-10-CM | POA: Diagnosis not present

## 2020-07-04 LAB — CBC WITH DIFFERENTIAL (CANCER CENTER ONLY)
Abs Immature Granulocytes: 0.01 10*3/uL (ref 0.00–0.07)
Basophils Absolute: 0 10*3/uL (ref 0.0–0.1)
Basophils Relative: 1 %
Eosinophils Absolute: 0.2 10*3/uL (ref 0.0–0.5)
Eosinophils Relative: 5 %
HCT: 34 % — ABNORMAL LOW (ref 39.0–52.0)
Hemoglobin: 11.1 g/dL — ABNORMAL LOW (ref 13.0–17.0)
Immature Granulocytes: 0 %
Lymphocytes Relative: 25 %
Lymphs Abs: 1 10*3/uL (ref 0.7–4.0)
MCH: 27.5 pg (ref 26.0–34.0)
MCHC: 32.6 g/dL (ref 30.0–36.0)
MCV: 84.2 fL (ref 80.0–100.0)
Monocytes Absolute: 0.3 10*3/uL (ref 0.1–1.0)
Monocytes Relative: 7 %
Neutro Abs: 2.5 10*3/uL (ref 1.7–7.7)
Neutrophils Relative %: 62 %
Platelet Count: 173 10*3/uL (ref 150–400)
RBC: 4.04 MIL/uL — ABNORMAL LOW (ref 4.22–5.81)
RDW: 14.3 % (ref 11.5–15.5)
WBC Count: 4.1 10*3/uL (ref 4.0–10.5)
nRBC: 0 % (ref 0.0–0.2)

## 2020-07-04 LAB — CMP (CANCER CENTER ONLY)
ALT: 10 U/L (ref 0–44)
AST: 12 U/L — ABNORMAL LOW (ref 15–41)
Albumin: 3.5 g/dL (ref 3.5–5.0)
Alkaline Phosphatase: 102 U/L (ref 38–126)
Anion gap: 7 (ref 5–15)
BUN: 21 mg/dL (ref 8–23)
CO2: 27 mmol/L (ref 22–32)
Calcium: 9.5 mg/dL (ref 8.9–10.3)
Chloride: 104 mmol/L (ref 98–111)
Creatinine: 1.39 mg/dL — ABNORMAL HIGH (ref 0.61–1.24)
GFR, Estimated: 50 mL/min — ABNORMAL LOW (ref >60)
Glucose, Bld: 203 mg/dL — ABNORMAL HIGH (ref 70–99)
Potassium: 4.8 mmol/L (ref 3.5–5.1)
Sodium: 138 mmol/L (ref 135–145)
Total Bilirubin: 0.4 mg/dL (ref 0.3–1.2)
Total Protein: 6.8 g/dL (ref 6.5–8.1)

## 2020-07-04 MED ORDER — HEPARIN SOD (PORK) LOCK FLUSH 100 UNIT/ML IV SOLN
500.0000 [IU] | Freq: Once | INTRAVENOUS | Status: AC | PRN
  Administered 2020-07-04: 500 [IU]
  Filled 2020-07-04: qty 5

## 2020-07-04 MED ORDER — SODIUM CHLORIDE 0.9 % IV SOLN
2000.0000 mg | Freq: Once | INTRAVENOUS | Status: AC
  Administered 2020-07-04: 2000 mg via INTRAVENOUS
  Filled 2020-07-04: qty 52.6

## 2020-07-04 MED ORDER — SODIUM CHLORIDE 0.9% FLUSH
10.0000 mL | INTRAVENOUS | Status: DC | PRN
  Administered 2020-07-04: 10 mL
  Filled 2020-07-04: qty 10

## 2020-07-04 MED ORDER — SODIUM CHLORIDE 0.9 % IV SOLN
150.0000 mg | Freq: Once | INTRAVENOUS | Status: AC
  Administered 2020-07-04: 150 mg via INTRAVENOUS
  Filled 2020-07-04: qty 150

## 2020-07-04 MED ORDER — PALONOSETRON HCL INJECTION 0.25 MG/5ML
INTRAVENOUS | Status: AC
  Filled 2020-07-04: qty 5

## 2020-07-04 MED ORDER — SODIUM CHLORIDE 0.9 % IV SOLN
10.0000 mg | Freq: Once | INTRAVENOUS | Status: AC
  Administered 2020-07-04: 10 mg via INTRAVENOUS
  Filled 2020-07-04: qty 10

## 2020-07-04 MED ORDER — SODIUM CHLORIDE 0.9 % IV SOLN
Freq: Once | INTRAVENOUS | Status: AC
  Filled 2020-07-04: qty 250

## 2020-07-04 MED ORDER — SODIUM CHLORIDE 0.9% FLUSH
10.0000 mL | Freq: Once | INTRAVENOUS | Status: AC
  Administered 2020-07-04: 10 mL
  Filled 2020-07-04: qty 10

## 2020-07-04 MED ORDER — SODIUM CHLORIDE 0.9 % IV SOLN
400.0000 mg | Freq: Once | INTRAVENOUS | Status: AC
  Administered 2020-07-04: 400 mg via INTRAVENOUS
  Filled 2020-07-04: qty 40

## 2020-07-04 MED ORDER — PALONOSETRON HCL INJECTION 0.25 MG/5ML
0.2500 mg | Freq: Once | INTRAVENOUS | Status: AC
  Administered 2020-07-04: 0.25 mg via INTRAVENOUS

## 2020-07-04 NOTE — Telephone Encounter (Signed)
Release: [id number] Faxed medical records to [facility] @ fax# [phone]

## 2020-07-04 NOTE — Patient Instructions (Signed)
[facility] Discharge Instructions for Patients Receiving Chemotherapy  Today you received the following chemotherapy agents: gemcitabine, carboplatin  To help prevent nausea and vomiting after your treatment, we encourage you to take your nausea medication as prescribed by your physician.    If you develop nausea and vomiting that is not controlled by your nausea medication, call the clinic.   BELOW ARE SYMPTOMS THAT SHOULD BE REPORTED IMMEDIATELY:  *FEVER GREATER THAN 100.5 F  *CHILLS WITH OR WITHOUT FEVER  NAUSEA AND VOMITING THAT IS NOT CONTROLLED WITH YOUR NAUSEA MEDICATION  *UNUSUAL SHORTNESS OF BREATH  *UNUSUAL BRUISING OR BLEEDING  TENDERNESS IN MOUTH AND THROAT WITH OR WITHOUT PRESENCE OF ULCERS  *URINARY PROBLEMS  *BOWEL PROBLEMS  UNUSUAL RASH Items with * indicate a potential emergency and should be followed up as soon as possible.  Feel free to call the clinic should you have any questions or concerns. The clinic phone number is [phone].  Please show the [facility] at check-in to the Emergency Department and triage nurse.  Gemcitabine injection What is this medicine? GEMCITABINE (jem SYE ta been) is a chemotherapy drug. This medicine is used to treat many types of cancer like breast cancer, lung cancer, pancreatic cancer, and ovarian cancer. This medicine may be used for other purposes; ask your health care provider or pharmacist if you have questions. COMMON BRAND NAME(S): Gemzar, Infugem What should I tell my health care provider before I take this medicine? They need to know if you have any of these conditions:  blood disorders  infection  kidney disease  liver disease  lung or breathing disease, like asthma  recent or ongoing radiation therapy  an unusual or allergic reaction to gemcitabine, other chemotherapy, other medicines, foods, dyes, or preservatives  pregnant or trying to get pregnant  breast-feeding How  should I use this medicine? This drug is given as an infusion into a vein. It is administered in a hospital or clinic by a specially trained health care professional. Talk to your pediatrician regarding the use of this medicine in children. Special care may be needed. Overdosage: If you think you have taken too much of this medicine contact a poison control center or emergency room at once. NOTE: This medicine is only for you. Do not share this medicine with others. What if I miss a dose? It is important not to miss your dose. Call your doctor or health care professional if you are unable to keep an appointment. What may interact with this medicine?  medicines to increase blood counts like filgrastim, pegfilgrastim, sargramostim  some other chemotherapy drugs like cisplatin  vaccines Talk to your doctor or health care professional before taking any of these medicines:  acetaminophen  aspirin  ibuprofen  ketoprofen  naproxen This list may not describe all possible interactions. Give your health care provider a list of all the medicines, herbs, non-prescription drugs, or dietary supplements you use. Also tell them if you smoke, drink alcohol, or use illegal drugs. Some items may interact with your medicine. What should I watch for while using this medicine? Visit your doctor for checks on your progress. This drug may make you feel generally unwell. This is not uncommon, as chemotherapy can affect healthy cells as well as cancer cells. Report any side effects. Continue your course of treatment even though you feel ill unless your doctor tells you to stop. In some cases, you may be given additional medicines to help with side effects. Follow all  directions for their use. Call your doctor or health care professional for advice if you get a fever, chills or sore throat, or other symptoms of a cold or flu. Do not treat yourself. This drug decreases your body's ability to fight infections. Try  to avoid being around people who are sick. This medicine may increase your risk to bruise or bleed. Call your doctor or health care professional if you notice any unusual bleeding. Be careful brushing and flossing your teeth or using a toothpick because you may get an infection or bleed more easily. If you have any dental work done, tell your dentist you are receiving this medicine. Avoid taking products that contain aspirin, acetaminophen, ibuprofen, naproxen, or ketoprofen unless instructed by your doctor. These medicines may hide a fever. Do not become pregnant while taking this medicine or for 6 months after stopping it. Women should inform their doctor if they wish to become pregnant or think they might be pregnant. Men should not father a child while taking this medicine and for 3 months after stopping it. There is a potential for serious side effects to an unborn child. Talk to your health care professional or pharmacist for more information. Do not breast-feed an infant while taking this medicine or for at least 1 week after stopping it. Men should inform their doctors if they wish to father a child. This medicine may lower sperm counts. Talk with your doctor or health care professional if you are concerned about your fertility. What side effects may I notice from receiving this medicine? Side effects that you should report to your doctor or health care professional as soon as possible:  allergic reactions like skin rash, itching or hives, swelling of the face, lips, or tongue  breathing problems  pain, redness, or irritation at site where injected  signs and symptoms of a dangerous change in heartbeat or heart rhythm like chest pain; dizziness; fast or irregular heartbeat; palpitations; feeling faint or lightheaded, falls; breathing problems  signs of decreased platelets or bleeding - bruising, pinpoint red spots on the skin, black, tarry stools, blood in the urine  signs of decreased red  blood cells - unusually weak or tired, feeling faint or lightheaded, falls  signs of infection - fever or chills, cough, sore throat, pain or difficulty passing urine  signs and symptoms of kidney injury like trouble passing urine or change in the amount of urine  signs and symptoms of liver injury like dark yellow or brown urine; general ill feeling or flu-like symptoms; light-colored stools; loss of appetite; nausea; right upper belly pain; unusually weak or tired; yellowing of the eyes or skin  swelling of ankles, feet, hands Side effects that usually do not require medical attention (report to your doctor or health care professional if they continue or are bothersome):  constipation  diarrhea  hair loss  loss of appetite  nausea  rash  vomiting This list may not describe all possible side effects. Call your doctor for medical advice about side effects. You may report side effects to FDA at 1-800-FDA-1088. Where should I keep my medicine? This drug is given in a hospital or clinic and will not be stored at home. NOTE: This sheet is a summary. It may not cover all possible information. If you have questions about this medicine, talk to your doctor, pharmacist, or health care provider.  2020 Elsevier/Gold Standard (2017-11-25 18:06:11)  Carboplatin injection What is this medicine? CARBOPLATIN (KAR boe pla tin) is a chemotherapy drug.  It targets fast dividing cells, like cancer cells, and causes these cells to die. This medicine is used to treat ovarian cancer and many other cancers. This medicine may be used for other purposes; ask your health care provider or pharmacist if you have questions. COMMON BRAND NAME(S): Paraplatin What should I tell my health care provider before I take this medicine? They need to know if you have any of these conditions:  blood disorders  hearing problems  kidney disease  recent or ongoing radiation therapy  an unusual or allergic reaction  to carboplatin, cisplatin, other chemotherapy, other medicines, foods, dyes, or preservatives  pregnant or trying to get pregnant  breast-feeding How should I use this medicine? This drug is usually given as an infusion into a vein. It is administered in a hospital or clinic by a specially trained health care professional. Talk to your pediatrician regarding the use of this medicine in children. Special care may be needed. Overdosage: If you think you have taken too much of this medicine contact a poison control center or emergency room at once. NOTE: This medicine is only for you. Do not share this medicine with others. What if I miss a dose? It is important not to miss a dose. Call your doctor or health care professional if you are unable to keep an appointment. What may interact with this medicine?  medicines for seizures  medicines to increase blood counts like filgrastim, pegfilgrastim, sargramostim  some antibiotics like amikacin, gentamicin, neomycin, streptomycin, tobramycin  vaccines Talk to your doctor or health care professional before taking any of these medicines:  acetaminophen  aspirin  ibuprofen  ketoprofen  naproxen This list may not describe all possible interactions. Give your health care provider a list of all the medicines, herbs, non-prescription drugs, or dietary supplements you use. Also tell them if you smoke, drink alcohol, or use illegal drugs. Some items may interact with your medicine. What should I watch for while using this medicine? Your condition will be monitored carefully while you are receiving this medicine. You will need important blood work done while you are taking this medicine. This drug may make you feel generally unwell. This is not uncommon, as chemotherapy can affect healthy cells as well as cancer cells. Report any side effects. Continue your course of treatment even though you feel ill unless your doctor tells you to stop. In some  cases, you may be given additional medicines to help with side effects. Follow all directions for their use. Call your doctor or health care professional for advice if you get a fever, chills or sore throat, or other symptoms of a cold or flu. Do not treat yourself. This drug decreases your body's ability to fight infections. Try to avoid being around people who are sick. This medicine may increase your risk to bruise or bleed. Call your doctor or health care professional if you notice any unusual bleeding. Be careful brushing and flossing your teeth or using a toothpick because you may get an infection or bleed more easily. If you have any dental work done, tell your dentist you are receiving this medicine. Avoid taking products that contain aspirin, acetaminophen, ibuprofen, naproxen, or ketoprofen unless instructed by your doctor. These medicines may hide a fever. Do not become pregnant while taking this medicine. Women should inform their doctor if they wish to become pregnant or think they might be pregnant. There is a potential for serious side effects to an unborn child. Talk to your health care  professional or pharmacist for more information. Do not breast-feed an infant while taking this medicine. What side effects may I notice from receiving this medicine? Side effects that you should report to your doctor or health care professional as soon as possible:  allergic reactions like skin rash, itching or hives, swelling of the face, lips, or tongue  signs of infection - fever or chills, cough, sore throat, pain or difficulty passing urine  signs of decreased platelets or bleeding - bruising, pinpoint red spots on the skin, black, tarry stools, nosebleeds  signs of decreased red blood cells - unusually weak or tired, fainting spells, lightheadedness  breathing problems  changes in hearing  changes in vision  chest pain  high blood pressure  low blood counts - This drug may decrease  the number of white blood cells, red blood cells and platelets. You may be at increased risk for infections and bleeding.  nausea and vomiting  pain, swelling, redness or irritation at the injection site  pain, tingling, numbness in the hands or feet  problems with balance, talking, walking  trouble passing urine or change in the amount of urine Side effects that usually do not require medical attention (report to your doctor or health care professional if they continue or are bothersome):  hair loss  loss of appetite  metallic taste in the mouth or changes in taste This list may not describe all possible side effects. Call your doctor for medical advice about side effects. You may report side effects to FDA at 1-800-FDA-1088. Where should I keep my medicine? This drug is given in a hospital or clinic and will not be stored at home. NOTE: This sheet is a summary. It may not cover all possible information. If you have questions about this medicine, talk to your doctor, pharmacist, or health care provider.  2020 Elsevier/Gold Standard (2007-12-07 14:38:05)

## 2020-07-11 ENCOUNTER — Inpatient Hospital Stay: Payer: Medicare Other

## 2020-07-11 ENCOUNTER — Inpatient Hospital Stay (HOSPITAL_BASED_OUTPATIENT_CLINIC_OR_DEPARTMENT_OTHER): Payer: Medicare Other | Admitting: Oncology

## 2020-07-11 ENCOUNTER — Other Ambulatory Visit: Payer: Self-pay

## 2020-07-11 VITALS — BP 148/60 | HR 72 | Temp 98.2°F | Resp 18 | Wt 176.2 lb

## 2020-07-11 DIAGNOSIS — D4959 Neoplasm of unspecified behavior of other genitourinary organ: Secondary | ICD-10-CM

## 2020-07-11 DIAGNOSIS — E119 Type 2 diabetes mellitus without complications: Secondary | ICD-10-CM | POA: Diagnosis not present

## 2020-07-11 DIAGNOSIS — C680 Malignant neoplasm of urethra: Secondary | ICD-10-CM | POA: Diagnosis not present

## 2020-07-11 DIAGNOSIS — I10 Essential (primary) hypertension: Secondary | ICD-10-CM | POA: Diagnosis not present

## 2020-07-11 DIAGNOSIS — E782 Mixed hyperlipidemia: Secondary | ICD-10-CM | POA: Diagnosis not present

## 2020-07-11 DIAGNOSIS — K219 Gastro-esophageal reflux disease without esophagitis: Secondary | ICD-10-CM | POA: Diagnosis not present

## 2020-07-11 DIAGNOSIS — Z5111 Encounter for antineoplastic chemotherapy: Secondary | ICD-10-CM | POA: Diagnosis not present

## 2020-07-11 DIAGNOSIS — Z95828 Presence of other vascular implants and grafts: Secondary | ICD-10-CM

## 2020-07-11 LAB — CBC WITH DIFFERENTIAL (CANCER CENTER ONLY)
Abs Immature Granulocytes: 0 10*3/uL (ref 0.00–0.07)
Basophils Absolute: 0 10*3/uL (ref 0.0–0.1)
Basophils Relative: 2 %
Eosinophils Absolute: 0 10*3/uL (ref 0.0–0.5)
Eosinophils Relative: 3 %
HCT: 29.7 % — ABNORMAL LOW (ref 39.0–52.0)
Hemoglobin: 9.7 g/dL — ABNORMAL LOW (ref 13.0–17.0)
Immature Granulocytes: 0 %
Lymphocytes Relative: 42 %
Lymphs Abs: 0.7 10*3/uL (ref 0.7–4.0)
MCH: 28 pg (ref 26.0–34.0)
MCHC: 32.7 g/dL (ref 30.0–36.0)
MCV: 85.8 fL (ref 80.0–100.0)
Monocytes Absolute: 0 10*3/uL — ABNORMAL LOW (ref 0.1–1.0)
Monocytes Relative: 2 %
Neutro Abs: 0.8 10*3/uL — ABNORMAL LOW (ref 1.7–7.7)
Neutrophils Relative %: 51 %
Platelet Count: 109 10*3/uL — ABNORMAL LOW (ref 150–400)
RBC: 3.46 MIL/uL — ABNORMAL LOW (ref 4.22–5.81)
RDW: 13.6 % (ref 11.5–15.5)
WBC Count: 1.6 10*3/uL — ABNORMAL LOW (ref 4.0–10.5)
nRBC: 0 % (ref 0.0–0.2)

## 2020-07-11 LAB — CMP (CANCER CENTER ONLY)
ALT: 34 U/L (ref 0–44)
AST: 25 U/L (ref 15–41)
Albumin: 3.3 g/dL — ABNORMAL LOW (ref 3.5–5.0)
Alkaline Phosphatase: 170 U/L — ABNORMAL HIGH (ref 38–126)
Anion gap: 7 (ref 5–15)
BUN: 21 mg/dL (ref 8–23)
CO2: 24 mmol/L (ref 22–32)
Calcium: 9.2 mg/dL (ref 8.9–10.3)
Chloride: 105 mmol/L (ref 98–111)
Creatinine: 1.37 mg/dL — ABNORMAL HIGH (ref 0.61–1.24)
GFR, Estimated: 54 mL/min — ABNORMAL LOW (ref >60)
Glucose, Bld: 366 mg/dL — ABNORMAL HIGH (ref 70–99)
Potassium: 5.1 mmol/L (ref 3.5–5.1)
Sodium: 136 mmol/L (ref 135–145)
Total Bilirubin: 0.3 mg/dL (ref 0.3–1.2)
Total Protein: 6.7 g/dL (ref 6.5–8.1)

## 2020-07-11 MED ORDER — SODIUM CHLORIDE 0.9% FLUSH
10.0000 mL | Freq: Once | INTRAVENOUS | Status: AC
  Administered 2020-07-11: 10 mL
  Filled 2020-07-11: qty 10

## 2020-07-11 MED ORDER — HEPARIN SOD (PORK) LOCK FLUSH 100 UNIT/ML IV SOLN
500.0000 [IU] | Freq: Once | INTRAVENOUS | Status: AC
  Administered 2020-07-11: 500 [IU]
  Filled 2020-07-11: qty 5

## 2020-07-11 NOTE — Progress Notes (Signed)
Hematology and Oncology Follow Up Visit  Hector Lopez [id number] 1946/09/08 74 y.o. 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  Initially presented with localized disease in May 2021.   Prior Therapy:  He underwent robotic assisted laparoscopic and nephro ureterectomy completed on Feb 10, 2020.  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  He was found to have metastatic pericaval lymph node as well as a right common iliac lymph node.  Current therapy: Carboplatin and gemcitabine started on July 04, 2020.  He is here for day 8 of cycle 1 of therapy.  Interim History: Mr. Hector Lopez returns today for a follow-up visit.  Since the last visit, he reports no major changes in his health.  He denies any nausea, vomiting or abdominal pain.  He tolerated the first cycle without any complaints.  He did report some sleepiness and fatigue associated with Compazine but otherwise no complaints.  Denies any fevers chills or flank pain.     Medications: I have reviewed the patient's current medications.  Current Outpatient Medications  Medication Sig Dispense Refill  . amLODipine (NORVASC) 10 MG tablet Take 10 mg by mouth at bedtime.     . cetirizine (ZYRTEC) 10 MG tablet Take 10 mg by mouth daily.    . finasteride (PROSCAR) 5 MG tablet Take 5 mg by mouth daily.    Marland Kitchen glipiZIDE (GLUCOTROL) 10 MG tablet Take 10 mg by mouth daily before breakfast.     . lidocaine-prilocaine (EMLA) cream Apply 1 application topically as needed. 30 g 0  . lisinopril (ZESTRIL) 40 MG tablet Take 40 mg by mouth at bedtime.     . metFORMIN (GLUCOPHAGE) 1000 MG tablet Take 1,000 mg by mouth 2 (two) times daily with a meal.      . omeprazole (PRILOSEC) 40 MG capsule Take 40 mg by mouth daily.    Marland Kitchen oxyCODONE-acetaminophen (PERCOCET) 5-325 MG tablet Take 1 tablet by mouth every 6 (six) hours as needed for moderate pain or severe pain. Post-operatively (Patient not taking: Reported on 06/20/2020) 15 tablet 0  . prochlorperazine (COMPAZINE) 10 MG tablet Take 1 tablet (10 mg total) by mouth every 6 (six) hours as needed for nausea or vomiting. 30 tablet 0  . senna-docusate (SENOKOT-S) 8.6-50 MG tablet Take 1 tablet by mouth 2 (two) times daily. While taking strong pain meds to prevent cosntipation. 10 tablet 0  . sertraline (ZOLOFT) 50 MG tablet Take 50 mg by mouth daily.    . simvastatin (ZOCOR) 20 MG tablet Take 20 mg by mouth daily.     Marland Kitchen sulfamethoxazole-trimethoprim (BACTRIM DS) 800-160 MG tablet Take 1 tablet by mouth daily. Start taking one day prior to your appointment for your first follow-up and catheter removal.  Continue taking for three days. (Patient not taking: Reported on 06/20/2020) 3 tablet 0   No current facility-administered medications for this visit.     Allergies:  Allergies  Allergen Reactions  . Codeine Nausea And Vomiting      Physical Exam: Blood pressure (!) 148/60, pulse 72, temperature 98.2 F (36.8 C), temperature source Oral, resp. rate 18, weight 176 lb 3.2 oz (79.9 kg), SpO2 100 %.   ECOG: 1   General appearance: Comfortable appearing without any discomfort Head: Normocephalic without any trauma  Oropharynx: Mucous membranes are moist and pink without any thrush or ulcers. Eyes: Pupils are equal and round reactive to light. Lymph nodes: No cervical, supraclavicular, inguinal or axillary lymphadenopathy.   Heart:regular rate and rhythm.  S1 and S2 without leg edema. Lung: Clear without any rhonchi or wheezes.  No dullness to percussion. Abdomin: Soft, nontender, nondistended with good bowel sounds.  No hepatosplenomegaly. Musculoskeletal: No joint deformity or effusion.  Full  range of motion noted. Neurological: No deficits noted on motor, sensory and deep tendon reflex exam. Skin: No petechial rash or dryness.  Appeared moist.      Lab Results: Lab Results  Component Value Date   WBC 4.1 07/04/2020   HGB 11.1 (L) 07/04/2020   HCT 34.0 (L) 07/04/2020   MCV 84.2 07/04/2020   PLT 173 07/04/2020     Chemistry      Component Value Date/Time   NA 138 07/04/2020 1257   K 4.8 07/04/2020 1257   CL 104 07/04/2020 1257   CO2 27 07/04/2020 1257   BUN 21 07/04/2020 1257   CREATININE 1.39 (H) 07/04/2020 1257      Component Value Date/Time   CALCIUM 9.5 07/04/2020 1257   ALKPHOS 102 07/04/2020 1257   AST 12 (L) 07/04/2020 1257   ALT 10 07/04/2020 1257   BILITOT 0.4 07/04/2020 1257        Impression and Plan:  74 year old man with:  1.    Right renal pelvis and ureteral stage IV high-grade urothelial carcinoma diagnosed in May 2021.    He developed metastatic disease in September 2021.   He is currently receiving chemotherapy utilizing carboplatin and gemcitabine and here for day 8 of cycle 1.  Risks and benefits of continuing this treatment were discussed today.  Potential complication include nausea, vomiting, myelosuppression, neutropenia possible sepsis.  CBC reviewed today which showed decline in his white cell count with absolute neutrophil count of 800.  We will cancel day 8 of chemotherapy at this time and resume cycle 2 as scheduled in 2 weeks.   2. IV access: No issues reported related to his Port-A-Cath insertion.  This will continue to be in use.  3. Antiemetics: No nausea or vomiting reported.  Compazine is available to him.  4. Renal function surveillance:  His creatinine clearance around 50 cc/min and will monitor on platinum based therapy.  5. Goals of care:  He has advanced disease although curative therapy is possible.  6. Follow-up: He will return in 2 weeks for the start of cycle 2 of therapy.  30  minutes were  spent on this encounter.  The time was dedicated to reviewing his disease status, updating treatment options and complications related to therapy.    Zola Button, MD 10/27/202110:56 AM

## 2020-07-12 ENCOUNTER — Telehealth: Payer: Self-pay | Admitting: Oncology

## 2020-07-12 NOTE — Telephone Encounter (Signed)
Scheduled appointment per 10/27 los. Mailed updated calendar to patient with appointments.

## 2020-07-23 DIAGNOSIS — E782 Mixed hyperlipidemia: Secondary | ICD-10-CM | POA: Diagnosis not present

## 2020-07-23 DIAGNOSIS — E1169 Type 2 diabetes mellitus with other specified complication: Secondary | ICD-10-CM | POA: Diagnosis not present

## 2020-07-23 DIAGNOSIS — C7910 Secondary malignant neoplasm of unspecified urinary organs: Secondary | ICD-10-CM | POA: Diagnosis not present

## 2020-07-23 DIAGNOSIS — I10 Essential (primary) hypertension: Secondary | ICD-10-CM | POA: Diagnosis not present

## 2020-07-23 DIAGNOSIS — D509 Iron deficiency anemia, unspecified: Secondary | ICD-10-CM | POA: Diagnosis not present

## 2020-07-23 DIAGNOSIS — R944 Abnormal results of kidney function studies: Secondary | ICD-10-CM | POA: Diagnosis not present

## 2020-07-23 DIAGNOSIS — Z6825 Body mass index (BMI) 25.0-25.9, adult: Secondary | ICD-10-CM | POA: Diagnosis not present

## 2020-07-23 DIAGNOSIS — C661 Malignant neoplasm of right ureter: Secondary | ICD-10-CM | POA: Diagnosis not present

## 2020-07-25 ENCOUNTER — Inpatient Hospital Stay (HOSPITAL_BASED_OUTPATIENT_CLINIC_OR_DEPARTMENT_OTHER): Payer: Medicare Other | Admitting: Oncology

## 2020-07-25 ENCOUNTER — Inpatient Hospital Stay: Payer: Medicare Other

## 2020-07-25 ENCOUNTER — Other Ambulatory Visit: Payer: Self-pay

## 2020-07-25 ENCOUNTER — Inpatient Hospital Stay: Payer: Medicare Other | Attending: Oncology

## 2020-07-25 VITALS — Temp 98.1°F

## 2020-07-25 VITALS — BP 150/72 | HR 72 | Resp 18 | Ht 70.5 in | Wt 178.7 lb

## 2020-07-25 DIAGNOSIS — Z95828 Presence of other vascular implants and grafts: Secondary | ICD-10-CM

## 2020-07-25 DIAGNOSIS — Z7984 Long term (current) use of oral hypoglycemic drugs: Secondary | ICD-10-CM | POA: Diagnosis not present

## 2020-07-25 DIAGNOSIS — Z23 Encounter for immunization: Secondary | ICD-10-CM | POA: Diagnosis not present

## 2020-07-25 DIAGNOSIS — Z79899 Other long term (current) drug therapy: Secondary | ICD-10-CM | POA: Insufficient documentation

## 2020-07-25 DIAGNOSIS — C680 Malignant neoplasm of urethra: Secondary | ICD-10-CM | POA: Diagnosis not present

## 2020-07-25 DIAGNOSIS — Z5111 Encounter for antineoplastic chemotherapy: Secondary | ICD-10-CM | POA: Diagnosis not present

## 2020-07-25 DIAGNOSIS — D4959 Neoplasm of unspecified behavior of other genitourinary organ: Secondary | ICD-10-CM

## 2020-07-25 DIAGNOSIS — C772 Secondary and unspecified malignant neoplasm of intra-abdominal lymph nodes: Secondary | ICD-10-CM | POA: Insufficient documentation

## 2020-07-25 LAB — CMP (CANCER CENTER ONLY)
ALT: 14 U/L (ref 0–44)
AST: 15 U/L (ref 15–41)
Albumin: 3.4 g/dL — ABNORMAL LOW (ref 3.5–5.0)
Alkaline Phosphatase: 131 U/L — ABNORMAL HIGH (ref 38–126)
Anion gap: 6 (ref 5–15)
BUN: 18 mg/dL (ref 8–23)
CO2: 25 mmol/L (ref 22–32)
Calcium: 8.7 mg/dL — ABNORMAL LOW (ref 8.9–10.3)
Chloride: 106 mmol/L (ref 98–111)
Creatinine: 1.45 mg/dL — ABNORMAL HIGH (ref 0.61–1.24)
GFR, Estimated: 51 mL/min — ABNORMAL LOW (ref >60)
Glucose, Bld: 227 mg/dL — ABNORMAL HIGH (ref 70–99)
Potassium: 4.8 mmol/L (ref 3.5–5.1)
Sodium: 137 mmol/L (ref 135–145)
Total Bilirubin: 0.4 mg/dL (ref 0.3–1.2)
Total Protein: 6.8 g/dL (ref 6.5–8.1)

## 2020-07-25 LAB — CBC WITH DIFFERENTIAL (CANCER CENTER ONLY)
Abs Immature Granulocytes: 0.02 10*3/uL (ref 0.00–0.07)
Basophils Absolute: 0.1 10*3/uL (ref 0.0–0.1)
Basophils Relative: 2 %
Eosinophils Absolute: 0.1 10*3/uL (ref 0.0–0.5)
Eosinophils Relative: 2 %
HCT: 31.1 % — ABNORMAL LOW (ref 39.0–52.0)
Hemoglobin: 10.3 g/dL — ABNORMAL LOW (ref 13.0–17.0)
Immature Granulocytes: 1 %
Lymphocytes Relative: 31 %
Lymphs Abs: 1 10*3/uL (ref 0.7–4.0)
MCH: 28.3 pg (ref 26.0–34.0)
MCHC: 33.1 g/dL (ref 30.0–36.0)
MCV: 85.4 fL (ref 80.0–100.0)
Monocytes Absolute: 0.4 10*3/uL (ref 0.1–1.0)
Monocytes Relative: 13 %
Neutro Abs: 1.7 10*3/uL (ref 1.7–7.7)
Neutrophils Relative %: 51 %
Platelet Count: 336 10*3/uL (ref 150–400)
RBC: 3.64 MIL/uL — ABNORMAL LOW (ref 4.22–5.81)
RDW: 15.2 % (ref 11.5–15.5)
WBC Count: 3.2 10*3/uL — ABNORMAL LOW (ref 4.0–10.5)
nRBC: 0 % (ref 0.0–0.2)

## 2020-07-25 MED ORDER — SODIUM CHLORIDE 0.9% FLUSH
10.0000 mL | INTRAVENOUS | Status: DC | PRN
  Administered 2020-07-25: 10 mL
  Filled 2020-07-25: qty 10

## 2020-07-25 MED ORDER — SODIUM CHLORIDE 0.9 % IV SOLN
10.0000 mg | Freq: Once | INTRAVENOUS | Status: AC
  Administered 2020-07-25: 10 mg via INTRAVENOUS
  Filled 2020-07-25: qty 10

## 2020-07-25 MED ORDER — SODIUM CHLORIDE 0.9% FLUSH
10.0000 mL | Freq: Once | INTRAVENOUS | Status: AC
  Administered 2020-07-25: 10 mL
  Filled 2020-07-25: qty 10

## 2020-07-25 MED ORDER — SODIUM CHLORIDE 0.9 % IV SOLN
Freq: Once | INTRAVENOUS | Status: AC
  Filled 2020-07-25: qty 250

## 2020-07-25 MED ORDER — SODIUM CHLORIDE 0.9 % IV SOLN
310.0000 mg | Freq: Once | INTRAVENOUS | Status: AC
  Administered 2020-07-25: 310 mg via INTRAVENOUS
  Filled 2020-07-25: qty 31

## 2020-07-25 MED ORDER — HEPARIN SOD (PORK) LOCK FLUSH 100 UNIT/ML IV SOLN
500.0000 [IU] | Freq: Once | INTRAVENOUS | Status: AC | PRN
  Administered 2020-07-25: 500 [IU]
  Filled 2020-07-25: qty 5

## 2020-07-25 MED ORDER — PALONOSETRON HCL INJECTION 0.25 MG/5ML
INTRAVENOUS | Status: AC
  Filled 2020-07-25: qty 5

## 2020-07-25 MED ORDER — SODIUM CHLORIDE 0.9 % IV SOLN
800.0000 mg/m2 | Freq: Once | INTRAVENOUS | Status: AC
  Administered 2020-07-25: 1596 mg via INTRAVENOUS
  Filled 2020-07-25: qty 41.98

## 2020-07-25 MED ORDER — SODIUM CHLORIDE 0.9 % IV SOLN
150.0000 mg | Freq: Once | INTRAVENOUS | Status: AC
  Administered 2020-07-25: 150 mg via INTRAVENOUS
  Filled 2020-07-25: qty 150

## 2020-07-25 MED ORDER — PALONOSETRON HCL INJECTION 0.25 MG/5ML
0.2500 mg | Freq: Once | INTRAVENOUS | Status: AC
  Administered 2020-07-25: 0.25 mg via INTRAVENOUS

## 2020-07-25 NOTE — Progress Notes (Signed)
Hematology and Oncology Follow Up Visit  Hector Lopez [id number] 1946-04-05 74 y.o. 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    Prior Therapy:  He underwent robotic assisted laparoscopic and nephro ureterectomy completed on Feb 10, 2020.  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  He was found to have metastatic pericaval lymph node as well as a right common iliac lymph node.  Current therapy: Carboplatin and gemcitabine started on July 04, 2020. He is here for day 1 cycle 2 of therapy.   Interim History: Mr. Samaan presents today for repeat evaluation. Since the last visit, he reports feeling well without any major complaints.  He denies any nausea, vomiting or abdominal pain.  He denies any infusion related complications.  His performance status and quality of life remained excellent.  He is eating well and has gained weight.     Medications: Unchanged on review. Current Outpatient Medications  Medication Sig Dispense Refill  . amLODipine (NORVASC) 10 MG tablet Take 10 mg by mouth at bedtime.     . cetirizine (ZYRTEC) 10 MG tablet Take 10 mg by mouth daily.    . finasteride (PROSCAR) 5 MG tablet Take 5 mg by mouth daily.    Marland Kitchen glipiZIDE (GLUCOTROL) 10 MG tablet Take 10 mg by mouth daily before breakfast.     . lidocaine-prilocaine (EMLA) cream Apply 1 application topically as needed. 30 g 0  . lisinopril (ZESTRIL) 40 MG tablet Take 40 mg by mouth at bedtime.     . metFORMIN (GLUCOPHAGE) 1000 MG tablet Take 1,000 mg by mouth 2 (two) times daily with a meal.     . omeprazole (PRILOSEC) 40 MG capsule Take 40 mg by mouth daily.    Marland Kitchen oxyCODONE-acetaminophen (PERCOCET) 5-325 MG  tablet Take 1 tablet by mouth every 6 (six) hours as needed for moderate pain or severe pain. Post-operatively (Patient not taking: Reported on 06/20/2020) 15 tablet 0  . prochlorperazine (COMPAZINE) 10 MG tablet Take 1 tablet (10 mg total) by mouth every 6 (six) hours as needed for nausea or vomiting. 30 tablet 0  . senna-docusate (SENOKOT-S) 8.6-50 MG tablet Take 1 tablet by mouth 2 (two) times daily. While taking strong pain meds to prevent cosntipation. 10 tablet 0  . sertraline (ZOLOFT) 50 MG tablet Take 50 mg by mouth daily.    . simvastatin (ZOCOR) 20 MG tablet Take 20 mg by mouth daily.     Marland Kitchen sulfamethoxazole-trimethoprim (BACTRIM DS) 800-160 MG tablet Take 1 tablet by mouth daily. Start taking one day prior to your appointment for your first follow-up and catheter removal.  Continue taking for three days. (Patient not taking: Reported on 06/20/2020) 3 tablet 0   No current facility-administered medications for this visit.   Facility-Administered Medications Ordered in Other Visits  Medication Dose Route Frequency Provider Last Rate Last Admin  . sodium chloride flush (NS) 0.9 % injection 10 mL  10 mL Intracatheter Once Wyatt Portela, MD         Allergies:  Allergies  Allergen Reactions  . Codeine Nausea And Vomiting      Physical Exam: Blood pressure (!) 150/72, pulse 72, resp. rate 18, height 5' 10.5" (1.791 m), weight 178 lb 11.2 oz (81.1 kg), SpO2 100 %.  ECOG: 1    General appearance: Alert, awake without any distress. Head: Atraumatic without abnormalities Oropharynx: Without any thrush or ulcers. Eyes: No scleral icterus. Lymph nodes: No lymphadenopathy noted in the cervical, supraclavicular, or axillary nodes Heart:regular rate and rhythm, without any murmurs or gallops.   Lung: Clear to auscultation without any rhonchi, wheezes or dullness to percussion. Abdomin: Soft, nontender without any shifting dullness or ascites. Musculoskeletal: No clubbing or  cyanosis. Neurological: No motor or sensory deficits. Skin: No rashes or lesions.      Lab Results: Lab Results  Component Value Date   WBC 1.6 (L) 07/11/2020   HGB 9.7 (L) 07/11/2020   HCT 29.7 (L) 07/11/2020   MCV 85.8 07/11/2020   PLT 109 (L) 07/11/2020     Chemistry      Component Value Date/Time   NA 136 07/11/2020 1112   K 5.1 07/11/2020 1112   CL 105 07/11/2020 1112   CO2 24 07/11/2020 1112   BUN 21 07/11/2020 1112   CREATININE 1.37 (H) 07/11/2020 1112      Component Value Date/Time   CALCIUM 9.2 07/11/2020 1112   ALKPHOS 170 (H) 07/11/2020 1112   AST 25 07/11/2020 1112   ALT 34 07/11/2020 1112   BILITOT 0.3 07/11/2020 1112        Impression and Plan:  74 year old man with:  1.   Stage IV high-grade urothelial carcinoma of the right renal pelvis diagnosed in September 2021. He presented with retroperitoneal lymph node involvement.   The natural course of his disease was updated at this time and treatment options were reviewed. He is currently receiving palliative chemotherapy although complete responses have been documented with this disease. Risks and benefits of continuing this treatment were discussed. Potential complications that include neutropenia, thrombocytopenia possible sepsis and bleeding.  He is agreeable to proceed and we will tentatively repeat imaging studies after cycle 3.  2. IV access: Port-A-Cath inserted and has been in use without any issues.  3. Antiemetics: Compazine is available to him without any nausea or vomiting.  4. Renal function surveillance:  Will continue to monitor his renal function on platinum therapy. Creatinine clearance remained around 50 cc/min.  5. Goals of care:  Aggressive measures are warranted although he has advanced disease.  6. Follow-up: Will be in 1 week for day 8 of chemotherapy and in 3 weeks for the start of cycle 3.  30  minutes were dedicated to this visit. The time was spent on  reviewing his disease status, discussing treatment options and future plan of care review.    Zola Button, MD 11/10/202111:18 AM

## 2020-08-01 ENCOUNTER — Other Ambulatory Visit: Payer: Self-pay

## 2020-08-01 ENCOUNTER — Inpatient Hospital Stay: Payer: Medicare Other

## 2020-08-01 VITALS — BP 131/80 | HR 85 | Temp 98.3°F | Resp 16

## 2020-08-01 DIAGNOSIS — Z5111 Encounter for antineoplastic chemotherapy: Secondary | ICD-10-CM | POA: Diagnosis not present

## 2020-08-01 DIAGNOSIS — Z95828 Presence of other vascular implants and grafts: Secondary | ICD-10-CM

## 2020-08-01 DIAGNOSIS — C680 Malignant neoplasm of urethra: Secondary | ICD-10-CM | POA: Diagnosis not present

## 2020-08-01 DIAGNOSIS — C772 Secondary and unspecified malignant neoplasm of intra-abdominal lymph nodes: Secondary | ICD-10-CM | POA: Diagnosis not present

## 2020-08-01 DIAGNOSIS — D4959 Neoplasm of unspecified behavior of other genitourinary organ: Secondary | ICD-10-CM

## 2020-08-01 DIAGNOSIS — Z7984 Long term (current) use of oral hypoglycemic drugs: Secondary | ICD-10-CM | POA: Diagnosis not present

## 2020-08-01 DIAGNOSIS — Z79899 Other long term (current) drug therapy: Secondary | ICD-10-CM | POA: Diagnosis not present

## 2020-08-01 DIAGNOSIS — Z23 Encounter for immunization: Secondary | ICD-10-CM

## 2020-08-01 LAB — CBC WITH DIFFERENTIAL (CANCER CENTER ONLY)
Abs Immature Granulocytes: 0.01 10*3/uL (ref 0.00–0.07)
Basophils Absolute: 0 10*3/uL (ref 0.0–0.1)
Basophils Relative: 2 %
Eosinophils Absolute: 0 10*3/uL (ref 0.0–0.5)
Eosinophils Relative: 1 %
HCT: 28.5 % — ABNORMAL LOW (ref 39.0–52.0)
Hemoglobin: 9.8 g/dL — ABNORMAL LOW (ref 13.0–17.0)
Immature Granulocytes: 1 %
Lymphocytes Relative: 41 %
Lymphs Abs: 0.7 10*3/uL (ref 0.7–4.0)
MCH: 29.2 pg (ref 26.0–34.0)
MCHC: 34.4 g/dL (ref 30.0–36.0)
MCV: 84.8 fL (ref 80.0–100.0)
Monocytes Absolute: 0.1 10*3/uL (ref 0.1–1.0)
Monocytes Relative: 3 %
Neutro Abs: 1 10*3/uL — ABNORMAL LOW (ref 1.7–7.7)
Neutrophils Relative %: 52 %
Platelet Count: 152 10*3/uL (ref 150–400)
RBC: 3.36 MIL/uL — ABNORMAL LOW (ref 4.22–5.81)
RDW: 14.6 % (ref 11.5–15.5)
WBC Count: 1.8 10*3/uL — ABNORMAL LOW (ref 4.0–10.5)
nRBC: 0 % (ref 0.0–0.2)

## 2020-08-01 LAB — CMP (CANCER CENTER ONLY)
ALT: 20 U/L (ref 0–44)
AST: 15 U/L (ref 15–41)
Albumin: 3.4 g/dL — ABNORMAL LOW (ref 3.5–5.0)
Alkaline Phosphatase: 119 U/L (ref 38–126)
Anion gap: 6 (ref 5–15)
BUN: 19 mg/dL (ref 8–23)
CO2: 26 mmol/L (ref 22–32)
Calcium: 9.1 mg/dL (ref 8.9–10.3)
Chloride: 106 mmol/L (ref 98–111)
Creatinine: 1.28 mg/dL — ABNORMAL HIGH (ref 0.61–1.24)
GFR, Estimated: 59 mL/min — ABNORMAL LOW (ref >60)
Glucose, Bld: 297 mg/dL — ABNORMAL HIGH (ref 70–99)
Potassium: 4.7 mmol/L (ref 3.5–5.1)
Sodium: 138 mmol/L (ref 135–145)
Total Bilirubin: 0.2 mg/dL — ABNORMAL LOW (ref 0.3–1.2)
Total Protein: 7 g/dL (ref 6.5–8.1)

## 2020-08-01 MED ORDER — SODIUM CHLORIDE 0.9% FLUSH
10.0000 mL | Freq: Once | INTRAVENOUS | Status: AC
  Administered 2020-08-01: 10 mL
  Filled 2020-08-01: qty 10

## 2020-08-01 MED ORDER — INFLUENZA VAC A&B SA ADJ QUAD 0.5 ML IM PRSY
0.5000 mL | PREFILLED_SYRINGE | Freq: Once | INTRAMUSCULAR | Status: AC
  Administered 2020-08-01: 0.5 mL via INTRAMUSCULAR

## 2020-08-01 MED ORDER — HEPARIN SOD (PORK) LOCK FLUSH 100 UNIT/ML IV SOLN
500.0000 [IU] | INTRAVENOUS | Status: AC | PRN
  Administered 2020-08-01: 500 [IU]
  Filled 2020-08-01: qty 5

## 2020-08-01 MED ORDER — SODIUM CHLORIDE 0.9% FLUSH
10.0000 mL | INTRAVENOUS | Status: AC | PRN
  Administered 2020-08-01: 10 mL
  Filled 2020-08-01: qty 10

## 2020-08-01 MED ORDER — INFLUENZA VAC A&B SA ADJ QUAD 0.5 ML IM PRSY
PREFILLED_SYRINGE | INTRAMUSCULAR | Status: AC
  Filled 2020-08-01: qty 0.5

## 2020-08-01 NOTE — Patient Instructions (Signed)

## 2020-08-01 NOTE — Progress Notes (Signed)
Patient presented to clinic for treatment c/o significant fatigue and "feel like a bus hit me." Also, states he had mouth tenderness and sore throat this past weekend. ANC 1.0; Glucose 297. Dr. Alen Blew aware of patient's complaints and lab results. Advised to hold treatment today and administer influenza vaccine. Patient aware of plan of care and agrees.

## 2020-08-15 ENCOUNTER — Other Ambulatory Visit: Payer: Self-pay

## 2020-08-15 ENCOUNTER — Inpatient Hospital Stay: Payer: Medicare Other | Attending: Oncology

## 2020-08-15 ENCOUNTER — Inpatient Hospital Stay: Payer: Medicare Other

## 2020-08-15 ENCOUNTER — Inpatient Hospital Stay (HOSPITAL_BASED_OUTPATIENT_CLINIC_OR_DEPARTMENT_OTHER): Payer: Medicare Other | Admitting: Oncology

## 2020-08-15 VITALS — BP 143/83 | HR 93 | Temp 98.0°F | Resp 18 | Wt 180.2 lb

## 2020-08-15 DIAGNOSIS — C680 Malignant neoplasm of urethra: Secondary | ICD-10-CM | POA: Insufficient documentation

## 2020-08-15 DIAGNOSIS — C772 Secondary and unspecified malignant neoplasm of intra-abdominal lymph nodes: Secondary | ICD-10-CM | POA: Diagnosis not present

## 2020-08-15 DIAGNOSIS — C649 Malignant neoplasm of unspecified kidney, except renal pelvis: Secondary | ICD-10-CM

## 2020-08-15 DIAGNOSIS — Z7984 Long term (current) use of oral hypoglycemic drugs: Secondary | ICD-10-CM | POA: Insufficient documentation

## 2020-08-15 DIAGNOSIS — Z95828 Presence of other vascular implants and grafts: Secondary | ICD-10-CM

## 2020-08-15 DIAGNOSIS — D4959 Neoplasm of unspecified behavior of other genitourinary organ: Secondary | ICD-10-CM

## 2020-08-15 DIAGNOSIS — Z79899 Other long term (current) drug therapy: Secondary | ICD-10-CM | POA: Insufficient documentation

## 2020-08-15 DIAGNOSIS — Z5111 Encounter for antineoplastic chemotherapy: Secondary | ICD-10-CM | POA: Insufficient documentation

## 2020-08-15 LAB — CBC WITH DIFFERENTIAL (CANCER CENTER ONLY)
Abs Immature Granulocytes: 0.02 10*3/uL (ref 0.00–0.07)
Basophils Absolute: 0 10*3/uL (ref 0.0–0.1)
Basophils Relative: 1 %
Eosinophils Absolute: 0.1 10*3/uL (ref 0.0–0.5)
Eosinophils Relative: 3 %
HCT: 32.3 % — ABNORMAL LOW (ref 39.0–52.0)
Hemoglobin: 10.7 g/dL — ABNORMAL LOW (ref 13.0–17.0)
Immature Granulocytes: 1 %
Lymphocytes Relative: 28 %
Lymphs Abs: 1.1 10*3/uL (ref 0.7–4.0)
MCH: 29.2 pg (ref 26.0–34.0)
MCHC: 33.1 g/dL (ref 30.0–36.0)
MCV: 88 fL (ref 80.0–100.0)
Monocytes Absolute: 0.5 10*3/uL (ref 0.1–1.0)
Monocytes Relative: 12 %
Neutro Abs: 2.2 10*3/uL (ref 1.7–7.7)
Neutrophils Relative %: 55 %
Platelet Count: 239 10*3/uL (ref 150–400)
RBC: 3.67 MIL/uL — ABNORMAL LOW (ref 4.22–5.81)
RDW: 16.9 % — ABNORMAL HIGH (ref 11.5–15.5)
WBC Count: 4 10*3/uL (ref 4.0–10.5)
nRBC: 0 % (ref 0.0–0.2)

## 2020-08-15 LAB — CMP (CANCER CENTER ONLY)
ALT: 14 U/L (ref 0–44)
AST: 16 U/L (ref 15–41)
Albumin: 3.7 g/dL (ref 3.5–5.0)
Alkaline Phosphatase: 118 U/L (ref 38–126)
Anion gap: 6 (ref 5–15)
BUN: 18 mg/dL (ref 8–23)
CO2: 24 mmol/L (ref 22–32)
Calcium: 9.2 mg/dL (ref 8.9–10.3)
Chloride: 105 mmol/L (ref 98–111)
Creatinine: 1.26 mg/dL — ABNORMAL HIGH (ref 0.61–1.24)
GFR, Estimated: 60 mL/min — ABNORMAL LOW (ref >60)
Glucose, Bld: 252 mg/dL — ABNORMAL HIGH (ref 70–99)
Potassium: 5 mmol/L (ref 3.5–5.1)
Sodium: 135 mmol/L (ref 135–145)
Total Bilirubin: 0.4 mg/dL (ref 0.3–1.2)
Total Protein: 7.3 g/dL (ref 6.5–8.1)

## 2020-08-15 MED ORDER — SODIUM CHLORIDE 0.9 % IV SOLN
800.0000 mg/m2 | Freq: Once | INTRAVENOUS | Status: AC
  Administered 2020-08-15: 1596 mg via INTRAVENOUS
  Filled 2020-08-15: qty 41.98

## 2020-08-15 MED ORDER — SODIUM CHLORIDE 0.9% FLUSH
10.0000 mL | Freq: Once | INTRAVENOUS | Status: AC
  Administered 2020-08-15: 10 mL
  Filled 2020-08-15: qty 10

## 2020-08-15 MED ORDER — SODIUM CHLORIDE 0.9 % IV SOLN
10.0000 mg | Freq: Once | INTRAVENOUS | Status: AC
  Administered 2020-08-15: 10 mg via INTRAVENOUS
  Filled 2020-08-15: qty 10

## 2020-08-15 MED ORDER — SODIUM CHLORIDE 0.9 % IV SOLN
Freq: Once | INTRAVENOUS | Status: AC
  Filled 2020-08-15: qty 250

## 2020-08-15 MED ORDER — PALONOSETRON HCL INJECTION 0.25 MG/5ML
0.2500 mg | Freq: Once | INTRAVENOUS | Status: AC
  Administered 2020-08-15: 0.25 mg via INTRAVENOUS

## 2020-08-15 MED ORDER — PALONOSETRON HCL INJECTION 0.25 MG/5ML
INTRAVENOUS | Status: AC
  Filled 2020-08-15: qty 5

## 2020-08-15 MED ORDER — SODIUM CHLORIDE 0.9 % IV SOLN
150.0000 mg | Freq: Once | INTRAVENOUS | Status: AC
  Administered 2020-08-15: 150 mg via INTRAVENOUS
  Filled 2020-08-15: qty 150

## 2020-08-15 MED ORDER — SODIUM CHLORIDE 0.9% FLUSH
10.0000 mL | INTRAVENOUS | Status: DC | PRN
  Filled 2020-08-15: qty 10

## 2020-08-15 MED ORDER — SODIUM CHLORIDE 0.9 % IV SOLN
310.0000 mg | Freq: Once | INTRAVENOUS | Status: AC
  Administered 2020-08-15: 310 mg via INTRAVENOUS
  Filled 2020-08-15: qty 31

## 2020-08-15 MED ORDER — HEPARIN SOD (PORK) LOCK FLUSH 100 UNIT/ML IV SOLN
500.0000 [IU] | Freq: Once | INTRAVENOUS | Status: DC | PRN
  Filled 2020-08-15: qty 5

## 2020-08-15 NOTE — Patient Instructions (Signed)
[facility] Discharge Instructions for Patients Receiving Chemotherapy  Today you received the following chemotherapy agents: gemcitabine, carboplatin  To help prevent nausea and vomiting after your treatment, we encourage you to take your nausea medication as prescribed by your physician.    If you develop nausea and vomiting that is not controlled by your nausea medication, call the clinic.   BELOW ARE SYMPTOMS THAT SHOULD BE REPORTED IMMEDIATELY:  *FEVER GREATER THAN 100.5 F  *CHILLS WITH OR WITHOUT FEVER  NAUSEA AND VOMITING THAT IS NOT CONTROLLED WITH YOUR NAUSEA MEDICATION  *UNUSUAL SHORTNESS OF BREATH  *UNUSUAL BRUISING OR BLEEDING  TENDERNESS IN MOUTH AND THROAT WITH OR WITHOUT PRESENCE OF ULCERS  *URINARY PROBLEMS  *BOWEL PROBLEMS  UNUSUAL RASH Items with * indicate a potential emergency and should be followed up as soon as possible.  Feel free to call the clinic should you have any questions or concerns. The clinic phone number is [phone].  Please show the [facility] at check-in to the Emergency Department and triage nurse.  Gemcitabine injection What is this medicine? GEMCITABINE (jem SYE ta been) is a chemotherapy drug. This medicine is used to treat many types of cancer like breast cancer, lung cancer, pancreatic cancer, and ovarian cancer. This medicine may be used for other purposes; ask your health care provider or pharmacist if you have questions. COMMON BRAND NAME(S): Gemzar, Infugem What should I tell my health care provider before I take this medicine? They need to know if you have any of these conditions:  blood disorders  infection  kidney disease  liver disease  lung or breathing disease, like asthma  recent or ongoing radiation therapy  an unusual or allergic reaction to gemcitabine, other chemotherapy, other medicines, foods, dyes, or preservatives  pregnant or trying to get pregnant  breast-feeding How  should I use this medicine? This drug is given as an infusion into a vein. It is administered in a hospital or clinic by a specially trained health care professional. Talk to your pediatrician regarding the use of this medicine in children. Special care may be needed. Overdosage: If you think you have taken too much of this medicine contact a poison control center or emergency room at once. NOTE: This medicine is only for you. Do not share this medicine with others. What if I miss a dose? It is important not to miss your dose. Call your doctor or health care professional if you are unable to keep an appointment. What may interact with this medicine?  medicines to increase blood counts like filgrastim, pegfilgrastim, sargramostim  some other chemotherapy drugs like cisplatin  vaccines Talk to your doctor or health care professional before taking any of these medicines:  acetaminophen  aspirin  ibuprofen  ketoprofen  naproxen This list may not describe all possible interactions. Give your health care provider a list of all the medicines, herbs, non-prescription drugs, or dietary supplements you use. Also tell them if you smoke, drink alcohol, or use illegal drugs. Some items may interact with your medicine. What should I watch for while using this medicine? Visit your doctor for checks on your progress. This drug may make you feel generally unwell. This is not uncommon, as chemotherapy can affect healthy cells as well as cancer cells. Report any side effects. Continue your course of treatment even though you feel ill unless your doctor tells you to stop. In some cases, you may be given additional medicines to help with side effects. Follow all  directions for their use. Call your doctor or health care professional for advice if you get a fever, chills or sore throat, or other symptoms of a cold or flu. Do not treat yourself. This drug decreases your body's ability to fight infections. Try  to avoid being around people who are sick. This medicine may increase your risk to bruise or bleed. Call your doctor or health care professional if you notice any unusual bleeding. Be careful brushing and flossing your teeth or using a toothpick because you may get an infection or bleed more easily. If you have any dental work done, tell your dentist you are receiving this medicine. Avoid taking products that contain aspirin, acetaminophen, ibuprofen, naproxen, or ketoprofen unless instructed by your doctor. These medicines may hide a fever. Do not become pregnant while taking this medicine or for 6 months after stopping it. Women should inform their doctor if they wish to become pregnant or think they might be pregnant. Men should not father a child while taking this medicine and for 3 months after stopping it. There is a potential for serious side effects to an unborn child. Talk to your health care professional or pharmacist for more information. Do not breast-feed an infant while taking this medicine or for at least 1 week after stopping it. Men should inform their doctors if they wish to father a child. This medicine may lower sperm counts. Talk with your doctor or health care professional if you are concerned about your fertility. What side effects may I notice from receiving this medicine? Side effects that you should report to your doctor or health care professional as soon as possible:  allergic reactions like skin rash, itching or hives, swelling of the face, lips, or tongue  breathing problems  pain, redness, or irritation at site where injected  signs and symptoms of a dangerous change in heartbeat or heart rhythm like chest pain; dizziness; fast or irregular heartbeat; palpitations; feeling faint or lightheaded, falls; breathing problems  signs of decreased platelets or bleeding - bruising, pinpoint red spots on the skin, black, tarry stools, blood in the urine  signs of decreased red  blood cells - unusually weak or tired, feeling faint or lightheaded, falls  signs of infection - fever or chills, cough, sore throat, pain or difficulty passing urine  signs and symptoms of kidney injury like trouble passing urine or change in the amount of urine  signs and symptoms of liver injury like dark yellow or brown urine; general ill feeling or flu-like symptoms; light-colored stools; loss of appetite; nausea; right upper belly pain; unusually weak or tired; yellowing of the eyes or skin  swelling of ankles, feet, hands Side effects that usually do not require medical attention (report to your doctor or health care professional if they continue or are bothersome):  constipation  diarrhea  hair loss  loss of appetite  nausea  rash  vomiting This list may not describe all possible side effects. Call your doctor for medical advice about side effects. You may report side effects to FDA at 1-800-FDA-1088. Where should I keep my medicine? This drug is given in a hospital or clinic and will not be stored at home. NOTE: This sheet is a summary. It may not cover all possible information. If you have questions about this medicine, talk to your doctor, pharmacist, or health care provider.  2020 Elsevier/Gold Standard (2017-11-25 18:06:11)  Carboplatin injection What is this medicine? CARBOPLATIN (KAR boe pla tin) is a chemotherapy drug.  It targets fast dividing cells, like cancer cells, and causes these cells to die. This medicine is used to treat ovarian cancer and many other cancers. This medicine may be used for other purposes; ask your health care provider or pharmacist if you have questions. COMMON BRAND NAME(S): Paraplatin What should I tell my health care provider before I take this medicine? They need to know if you have any of these conditions:  blood disorders  hearing problems  kidney disease  recent or ongoing radiation therapy  an unusual or allergic reaction  to carboplatin, cisplatin, other chemotherapy, other medicines, foods, dyes, or preservatives  pregnant or trying to get pregnant  breast-feeding How should I use this medicine? This drug is usually given as an infusion into a vein. It is administered in a hospital or clinic by a specially trained health care professional. Talk to your pediatrician regarding the use of this medicine in children. Special care may be needed. Overdosage: If you think you have taken too much of this medicine contact a poison control center or emergency room at once. NOTE: This medicine is only for you. Do not share this medicine with others. What if I miss a dose? It is important not to miss a dose. Call your doctor or health care professional if you are unable to keep an appointment. What may interact with this medicine?  medicines for seizures  medicines to increase blood counts like filgrastim, pegfilgrastim, sargramostim  some antibiotics like amikacin, gentamicin, neomycin, streptomycin, tobramycin  vaccines Talk to your doctor or health care professional before taking any of these medicines:  acetaminophen  aspirin  ibuprofen  ketoprofen  naproxen This list may not describe all possible interactions. Give your health care provider a list of all the medicines, herbs, non-prescription drugs, or dietary supplements you use. Also tell them if you smoke, drink alcohol, or use illegal drugs. Some items may interact with your medicine. What should I watch for while using this medicine? Your condition will be monitored carefully while you are receiving this medicine. You will need important blood work done while you are taking this medicine. This drug may make you feel generally unwell. This is not uncommon, as chemotherapy can affect healthy cells as well as cancer cells. Report any side effects. Continue your course of treatment even though you feel ill unless your doctor tells you to stop. In some  cases, you may be given additional medicines to help with side effects. Follow all directions for their use. Call your doctor or health care professional for advice if you get a fever, chills or sore throat, or other symptoms of a cold or flu. Do not treat yourself. This drug decreases your body's ability to fight infections. Try to avoid being around people who are sick. This medicine may increase your risk to bruise or bleed. Call your doctor or health care professional if you notice any unusual bleeding. Be careful brushing and flossing your teeth or using a toothpick because you may get an infection or bleed more easily. If you have any dental work done, tell your dentist you are receiving this medicine. Avoid taking products that contain aspirin, acetaminophen, ibuprofen, naproxen, or ketoprofen unless instructed by your doctor. These medicines may hide a fever. Do not become pregnant while taking this medicine. Women should inform their doctor if they wish to become pregnant or think they might be pregnant. There is a potential for serious side effects to an unborn child. Talk to your health care  professional or pharmacist for more information. Do not breast-feed an infant while taking this medicine. What side effects may I notice from receiving this medicine? Side effects that you should report to your doctor or health care professional as soon as possible:  allergic reactions like skin rash, itching or hives, swelling of the face, lips, or tongue  signs of infection - fever or chills, cough, sore throat, pain or difficulty passing urine  signs of decreased platelets or bleeding - bruising, pinpoint red spots on the skin, black, tarry stools, nosebleeds  signs of decreased red blood cells - unusually weak or tired, fainting spells, lightheadedness  breathing problems  changes in hearing  changes in vision  chest pain  high blood pressure  low blood counts - This drug may decrease  the number of white blood cells, red blood cells and platelets. You may be at increased risk for infections and bleeding.  nausea and vomiting  pain, swelling, redness or irritation at the injection site  pain, tingling, numbness in the hands or feet  problems with balance, talking, walking  trouble passing urine or change in the amount of urine Side effects that usually do not require medical attention (report to your doctor or health care professional if they continue or are bothersome):  hair loss  loss of appetite  metallic taste in the mouth or changes in taste This list may not describe all possible side effects. Call your doctor for medical advice about side effects. You may report side effects to FDA at 1-800-FDA-1088. Where should I keep my medicine? This drug is given in a hospital or clinic and will not be stored at home. NOTE: This sheet is a summary. It may not cover all possible information. If you have questions about this medicine, talk to your doctor, pharmacist, or health care provider.  2020 Elsevier/Gold Standard (2007-12-07 14:38:05)

## 2020-08-15 NOTE — Patient Instructions (Signed)

## 2020-08-15 NOTE — Progress Notes (Signed)
Hematology and Oncology Follow Up Visit  Hector Lopez [id number] 1946-08-19 74 y.o. 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  He has subsequently developed stage IV disease with retroperitoneal adenopathy noted in September 2021.    Prior Therapy:  He underwent robotic assisted laparoscopic  nephroureterectomy completed on Feb 10, 2020.  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  He was found to have metastatic pericaval lymph node as well as a right common iliac lymph node.  Current therapy: Carboplatin and gemcitabine started on July 04, 2020. He is here for day 1 cycle 3 of therapy.   Interim History: Hector Lopez returns today for a follow-up visit.  Since the last visit, he reports no major changes in his health.  He has tolerated last cycle of chemotherapy without any complaints.  He denies any nausea vomiting or excessive fatigue.  He denies any diarrhea or changes in bowel habits.  Continues to be active and attends to activities of daily living.     Medications: Updated on review. Current Outpatient Medications  Medication Sig Dispense Refill  . amLODipine (NORVASC) 10 MG tablet Take 10 mg by mouth at bedtime.     . cetirizine (ZYRTEC) 10 MG tablet Take 10 mg by mouth daily.    . finasteride (PROSCAR) 5 MG tablet Take 5 mg by mouth daily.    Marland Kitchen glipiZIDE (GLUCOTROL) 10 MG tablet Take 10 mg by mouth daily before breakfast.     . lidocaine-prilocaine (EMLA) cream Apply 1 application topically as needed. 30 g 0  . lisinopril (ZESTRIL) 40 MG tablet Take 40 mg by mouth at bedtime.     . metFORMIN (GLUCOPHAGE) 1000 MG tablet Take 1,000 mg by mouth 2 (two) times daily with a meal.     . omeprazole (PRILOSEC) 40 MG capsule Take 40 mg by mouth daily.    Marland Kitchen  oxyCODONE-acetaminophen (PERCOCET) 5-325 MG tablet Take 1 tablet by mouth every 6 (six) hours as needed for moderate pain or severe pain. Post-operatively (Patient not taking: Reported on 06/20/2020) 15 tablet 0  . prochlorperazine (COMPAZINE) 10 MG tablet Take 1 tablet (10 mg total) by mouth every 6 (six) hours as needed for nausea or vomiting. 30 tablet 0  . senna-docusate (SENOKOT-S) 8.6-50 MG tablet Take 1 tablet by mouth 2 (two) times daily. While taking strong pain meds to prevent cosntipation. 10 tablet 0  . sertraline (ZOLOFT) 50 MG tablet Take 50 mg by mouth daily.    . simvastatin (ZOCOR) 20 MG tablet Take 20 mg by mouth daily.     Marland Kitchen sulfamethoxazole-trimethoprim (BACTRIM DS) 800-160 MG tablet Take 1 tablet by mouth daily. Start taking one day prior to your appointment for your first follow-up and catheter removal.  Continue taking for three days. (Patient not taking: Reported on 06/20/2020) 3 tablet 0   No current facility-administered medications for this visit.     Allergies:  Allergies  Allergen Reactions  . Codeine Nausea And Vomiting      Physical Exam: Blood pressure (!) 143/83, pulse 93, temperature 98 F (36.7 C), temperature source Tympanic, resp. rate 18, weight 180 lb 3.2 oz (81.7 kg), SpO2 100 %.    ECOG: 1   General appearance: Comfortable appearing without any discomfort Head: Normocephalic without any trauma Oropharynx: Mucous membranes are moist and pink without  any thrush or ulcers. Eyes: Pupils are equal and round reactive to light. Lymph nodes: No cervical, supraclavicular, inguinal or axillary lymphadenopathy.   Heart:regular rate and rhythm.  S1 and S2 without leg edema. Lung: Clear without any rhonchi or wheezes.  No dullness to percussion. Abdomin: Soft, nontender, nondistended with good bowel sounds.  No hepatosplenomegaly. Musculoskeletal: No joint deformity or effusion.  Full range of motion noted. Neurological: No deficits noted on motor,  sensory and deep tendon reflex exam. Skin: No petechial rash or dryness.  Appeared moist.        Lab Results: Lab Results  Component Value Date   WBC 1.8 (L) 08/01/2020   HGB 9.8 (L) 08/01/2020   HCT 28.5 (L) 08/01/2020   MCV 84.8 08/01/2020   PLT 152 08/01/2020     Chemistry      Component Value Date/Time   NA 138 08/01/2020 1300   K 4.7 08/01/2020 1300   CL 106 08/01/2020 1300   CO2 26 08/01/2020 1300   BUN 19 08/01/2020 1300   CREATININE 1.28 (H) 08/01/2020 1300      Component Value Date/Time   CALCIUM 9.1 08/01/2020 1300   ALKPHOS 119 08/01/2020 1300   AST 15 08/01/2020 1300   ALT 20 08/01/2020 1300   BILITOT 0.2 (L) 08/01/2020 1300        Impression and Plan:  74 year old man with:  1.    Right renal pelvis high-grade urothelial carcinoma presented in May 2021 with localized disease.  He subsequently developed stage IV involvement with lymphadenopathy in September 2021.   He is currently receiving palliative chemotherapy utilizing carboplatin and gemcitabine without any major complications.  Risks and benefits of continuing this treatment were discussed.  Repeat imaging studies after cycle 3 of therapy will be planned to assess his response.  He has not received day 8 of chemotherapy because of cytopenias.  We will adjust the dosing and potential use growth factor support if needed.  He is agreeable to proceed with this plan.  Alternative treatment options would be immunotherapy with Pembrolizumab or Padcev chemotherapy.  2. IV access: Port-A-Cath currently in place without any issues.  3. Antiemetics: No nausea or vomiting reported.  Compazine is available to him.  4. Renal function surveillance:  Creatinine clearance remains above 50 cc/min and will continue to monitor on platinum therapy.  5. Goals of care: 's disease is incurable although aggressive measures are warranted given his recent formal status.  6. Follow-up: He will return in 1  week to complete cycle 3 and 3 weeks for the start of cycle 4 of therapy.  30  minutes were spent on this visit.  The time was dedicated to reviewing his disease status, treatment options and outlining future plan of care.    Zola Button, MD 12/1/20218:45 AM

## 2020-08-22 ENCOUNTER — Inpatient Hospital Stay: Payer: Medicare Other

## 2020-08-22 ENCOUNTER — Other Ambulatory Visit: Payer: Self-pay

## 2020-08-22 VITALS — BP 154/79 | HR 79 | Temp 98.2°F | Resp 18 | Wt 180.0 lb

## 2020-08-22 DIAGNOSIS — Z79899 Other long term (current) drug therapy: Secondary | ICD-10-CM | POA: Diagnosis not present

## 2020-08-22 DIAGNOSIS — D4959 Neoplasm of unspecified behavior of other genitourinary organ: Secondary | ICD-10-CM

## 2020-08-22 DIAGNOSIS — Z7984 Long term (current) use of oral hypoglycemic drugs: Secondary | ICD-10-CM | POA: Diagnosis not present

## 2020-08-22 DIAGNOSIS — Z5111 Encounter for antineoplastic chemotherapy: Secondary | ICD-10-CM | POA: Diagnosis not present

## 2020-08-22 DIAGNOSIS — C772 Secondary and unspecified malignant neoplasm of intra-abdominal lymph nodes: Secondary | ICD-10-CM | POA: Diagnosis not present

## 2020-08-22 DIAGNOSIS — C680 Malignant neoplasm of urethra: Secondary | ICD-10-CM | POA: Diagnosis not present

## 2020-08-22 LAB — CBC WITH DIFFERENTIAL (CANCER CENTER ONLY)
Abs Immature Granulocytes: 0 10*3/uL (ref 0.00–0.07)
Basophils Absolute: 0 10*3/uL (ref 0.0–0.1)
Basophils Relative: 2 %
Eosinophils Absolute: 0 10*3/uL (ref 0.0–0.5)
Eosinophils Relative: 2 %
HCT: 29.1 % — ABNORMAL LOW (ref 39.0–52.0)
Hemoglobin: 9.8 g/dL — ABNORMAL LOW (ref 13.0–17.0)
Immature Granulocytes: 0 %
Lymphocytes Relative: 52 %
Lymphs Abs: 0.7 10*3/uL (ref 0.7–4.0)
MCH: 29.1 pg (ref 26.0–34.0)
MCHC: 33.7 g/dL (ref 30.0–36.0)
MCV: 86.4 fL (ref 80.0–100.0)
Monocytes Absolute: 0 10*3/uL — ABNORMAL LOW (ref 0.1–1.0)
Monocytes Relative: 3 %
Neutro Abs: 0.6 10*3/uL — ABNORMAL LOW (ref 1.7–7.7)
Neutrophils Relative %: 41 %
Platelet Count: 112 10*3/uL — ABNORMAL LOW (ref 150–400)
RBC: 3.37 MIL/uL — ABNORMAL LOW (ref 4.22–5.81)
RDW: 15.6 % — ABNORMAL HIGH (ref 11.5–15.5)
WBC Count: 1.3 10*3/uL — ABNORMAL LOW (ref 4.0–10.5)
nRBC: 0 % (ref 0.0–0.2)

## 2020-08-22 LAB — CMP (CANCER CENTER ONLY)
ALT: 21 U/L (ref 0–44)
AST: 22 U/L (ref 15–41)
Albumin: 3.6 g/dL (ref 3.5–5.0)
Alkaline Phosphatase: 101 U/L (ref 38–126)
Anion gap: 7 (ref 5–15)
BUN: 19 mg/dL (ref 8–23)
CO2: 23 mmol/L (ref 22–32)
Calcium: 9.2 mg/dL (ref 8.9–10.3)
Chloride: 107 mmol/L (ref 98–111)
Creatinine: 1.32 mg/dL — ABNORMAL HIGH (ref 0.61–1.24)
GFR, Estimated: 57 mL/min — ABNORMAL LOW (ref >60)
Glucose, Bld: 231 mg/dL — ABNORMAL HIGH (ref 70–99)
Potassium: 4.8 mmol/L (ref 3.5–5.1)
Sodium: 137 mmol/L (ref 135–145)
Total Bilirubin: 0.5 mg/dL (ref 0.3–1.2)
Total Protein: 7.1 g/dL (ref 6.5–8.1)

## 2020-08-22 MED ORDER — PROCHLORPERAZINE MALEATE 10 MG PO TABS: 10.0000 mg | ORAL_TABLET | Freq: Once | ORAL | Status: DC

## 2020-08-22 MED ORDER — SODIUM CHLORIDE 0.9 % IV SOLN: 800.0000 mg/m2 | Freq: Once | INTRAVENOUS | Status: DC

## 2020-08-22 MED ORDER — SODIUM CHLORIDE 0.9% FLUSH
10.0000 mL | INTRAVENOUS | Status: DC | PRN
  Administered 2020-08-22: 10 mL
  Filled 2020-08-22: qty 10

## 2020-08-22 MED ORDER — SODIUM CHLORIDE 0.9 % IV SOLN
Freq: Once | INTRAVENOUS | Status: AC
  Filled 2020-08-22: qty 250

## 2020-08-22 MED ORDER — PROCHLORPERAZINE MALEATE 10 MG PO TABS
ORAL_TABLET | ORAL | Status: AC
  Filled 2020-08-22: qty 1

## 2020-08-22 MED ORDER — HEPARIN SOD (PORK) LOCK FLUSH 100 UNIT/ML IV SOLN
500.0000 [IU] | Freq: Once | INTRAVENOUS | Status: AC | PRN
  Administered 2020-08-22: 500 [IU]
  Filled 2020-08-22: qty 5

## 2020-08-22 NOTE — Progress Notes (Signed)
Per Dr. Alen Blew: No treatment today due to [place] of 0.6. Patient can go home. Discussed Neutropenic precautions with patient and he verbalized understanding.

## 2020-08-22 NOTE — Progress Notes (Signed)
Per Dr. Alen Blew: okay to treat with CMET results.

## 2020-08-31 ENCOUNTER — Ambulatory Visit (HOSPITAL_COMMUNITY)
Admission: RE | Admit: 2020-08-31 | Discharge: 2020-08-31 | Disposition: A | Discharge status: Home / Self Care | Payer: Medicare Other | Source: Ambulatory Visit | Attending: Oncology | Admitting: Oncology

## 2020-08-31 ENCOUNTER — Other Ambulatory Visit: Payer: Self-pay

## 2020-08-31 DIAGNOSIS — I700 Atherosclerosis of aorta: Secondary | ICD-10-CM | POA: Diagnosis not present

## 2020-08-31 DIAGNOSIS — I2510 Atherosclerotic heart disease of native coronary artery without angina pectoris: Secondary | ICD-10-CM | POA: Diagnosis not present

## 2020-08-31 DIAGNOSIS — K8020 Calculus of gallbladder without cholecystitis without obstruction: Secondary | ICD-10-CM | POA: Diagnosis not present

## 2020-08-31 DIAGNOSIS — C649 Malignant neoplasm of unspecified kidney, except renal pelvis: Secondary | ICD-10-CM | POA: Diagnosis not present

## 2020-08-31 DIAGNOSIS — D4959 Neoplasm of unspecified behavior of other genitourinary organ: Secondary | ICD-10-CM | POA: Insufficient documentation

## 2020-08-31 IMAGING — CT CT CHEST-ABD-PELV W/ CM
2 of 5 series · 12 of 36 positions shown, 14 images · IV contrast (omnipaque)
Comparison: [DATE]

CLINICAL DATA: Right nephrectomy for urothelial carcinoma [REDACTED].
Ongoing chemotherapy. Diabetes.

EXAM:
CT CHEST, ABDOMEN, AND PELVIS WITH CONTRAST
TECHNIQUE: Multidetector CT imaging of the chest, abdomen and pelvis was
performed following the standard protocol during bolus
administration of intravenous contrast.
CONTRAST:  100mL OMNIPAQUE IOHEXOL 300 MG/ML  SOLN

[Series 2: cap with · axial · 0.94mm/px · z∈[+902,+1492]mm · 9 of 148 slices shown, 11 images]
[im 15/148  mediastinal]
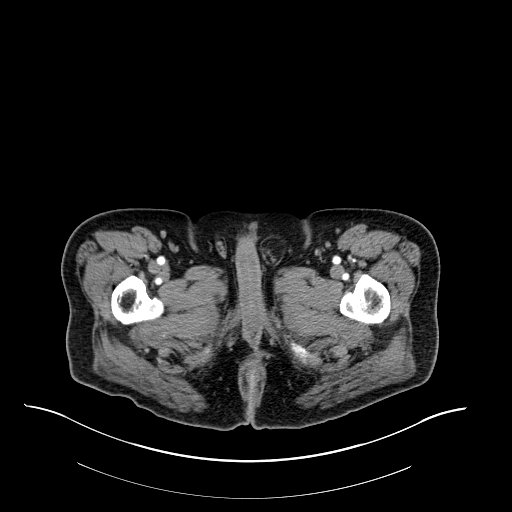
[im 15/148  bone]
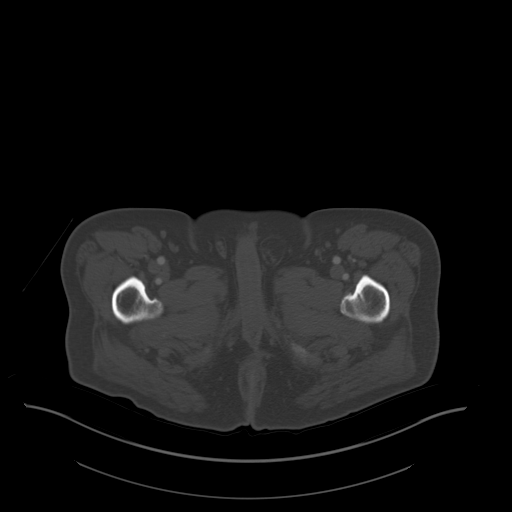
[im 30/148  mediastinal]
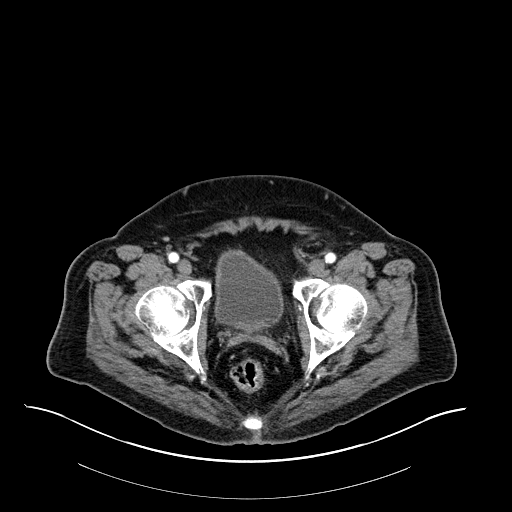
[im 45/148  mediastinal]
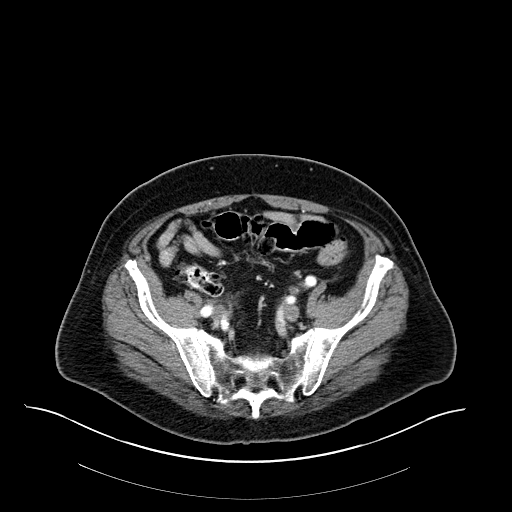
[im 59/148  mediastinal]
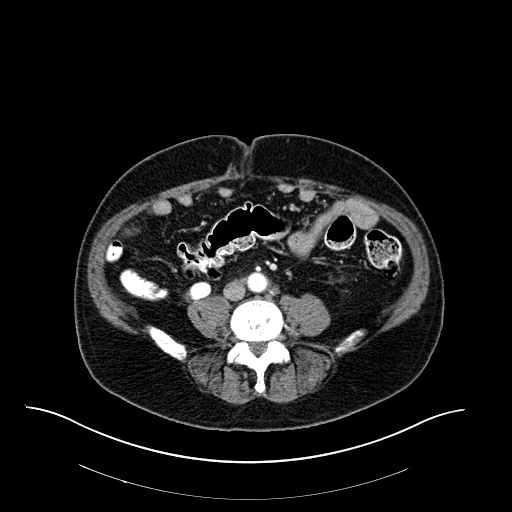
[im 74/148  mediastinal]
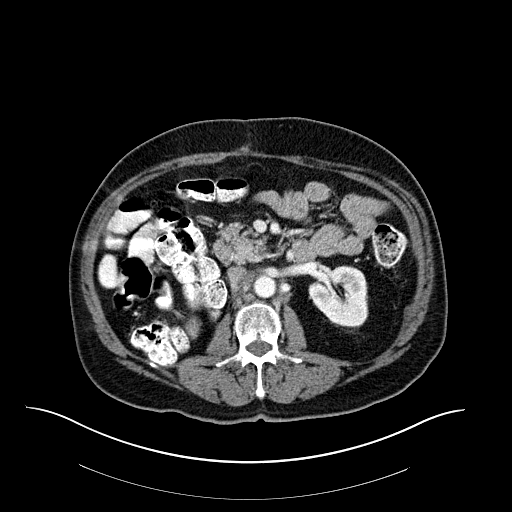
[im 89/148  mediastinal]
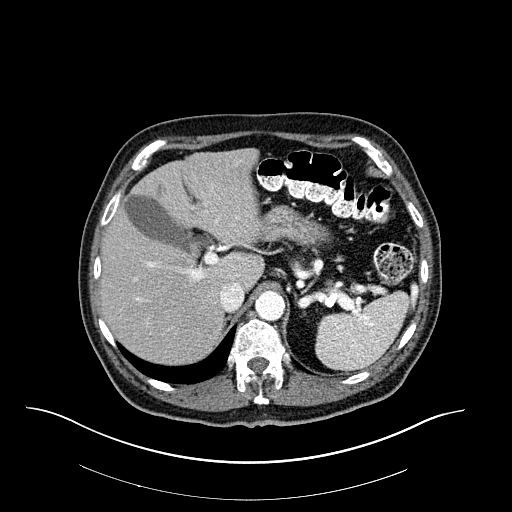
[im 103/148  mediastinal]
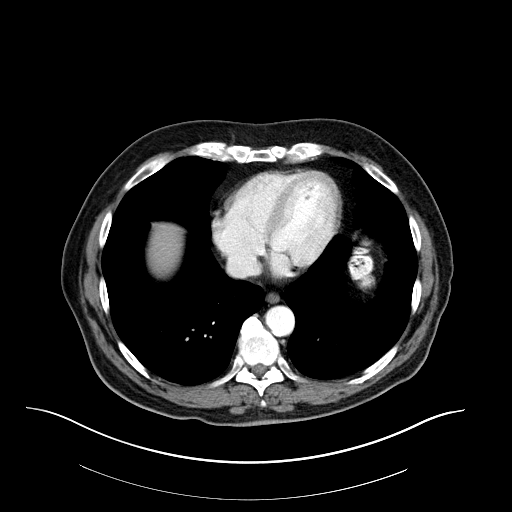
[im 118/148  mediastinal]
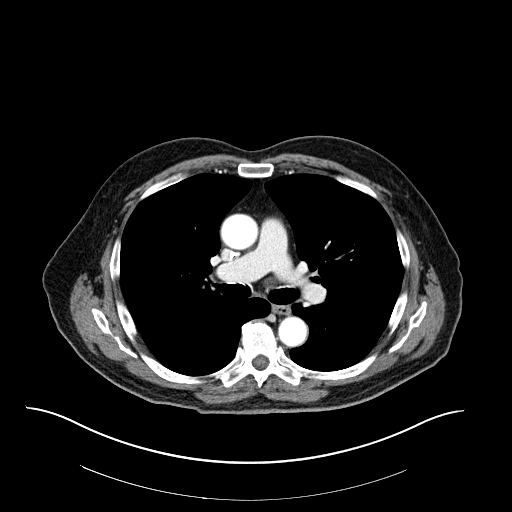
[im 133/148  mediastinal]
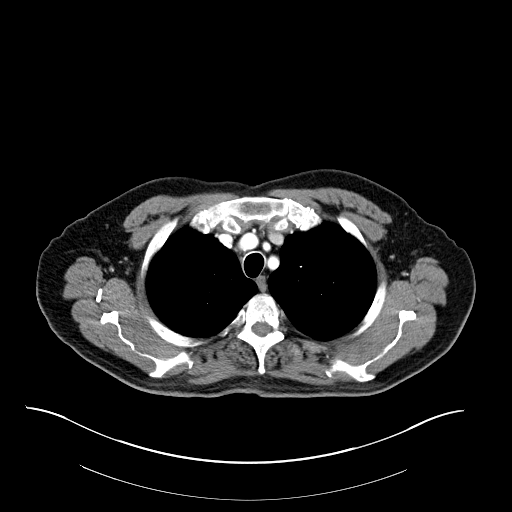
[im 133/148  bone]
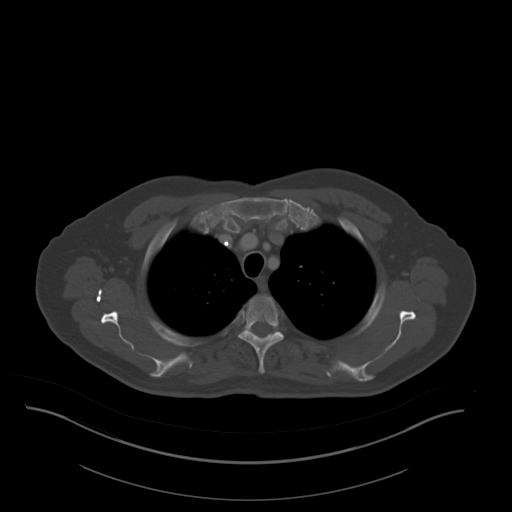

[Series 3: coronals · coronal · 0.76mm/px · 3 of 154 slices shown]
[im 31/154  mediastinal]
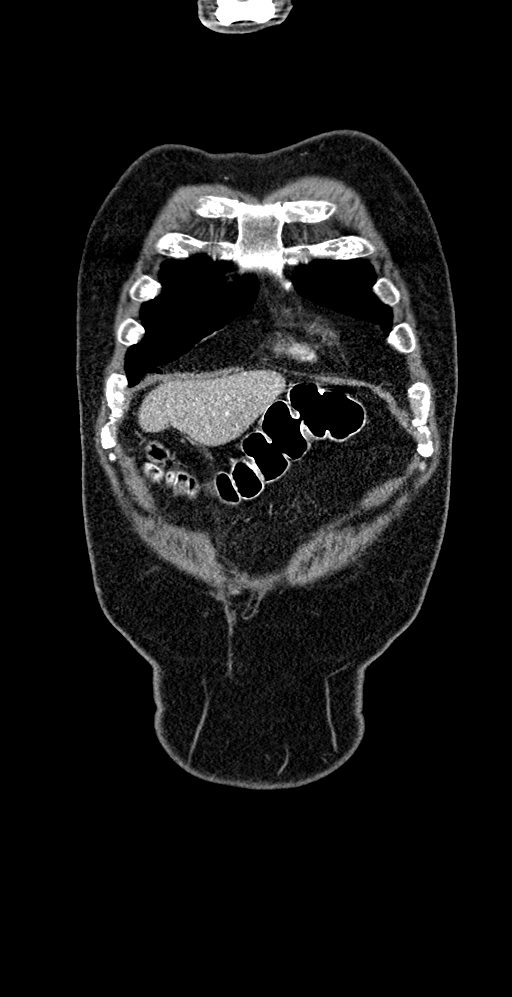
[im 62/154  mediastinal]
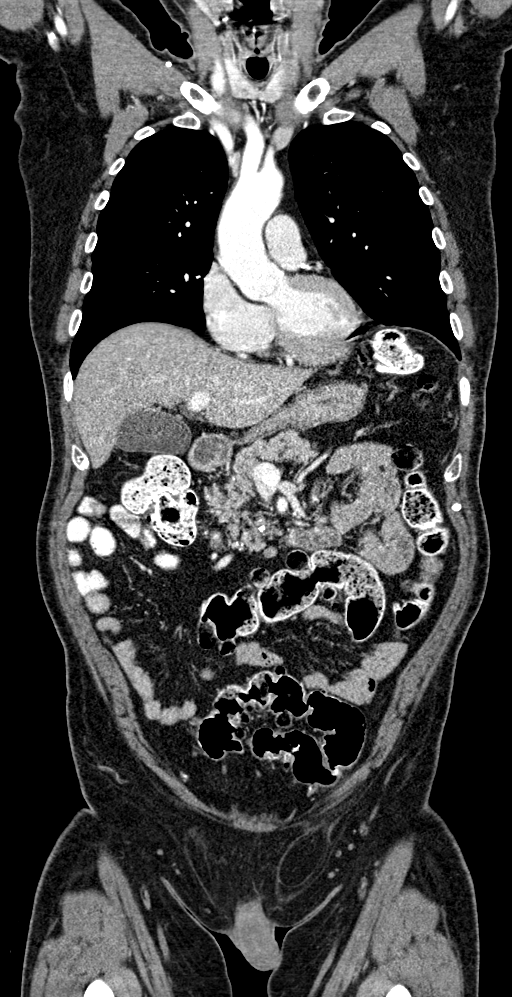
[im 92/154  mediastinal]
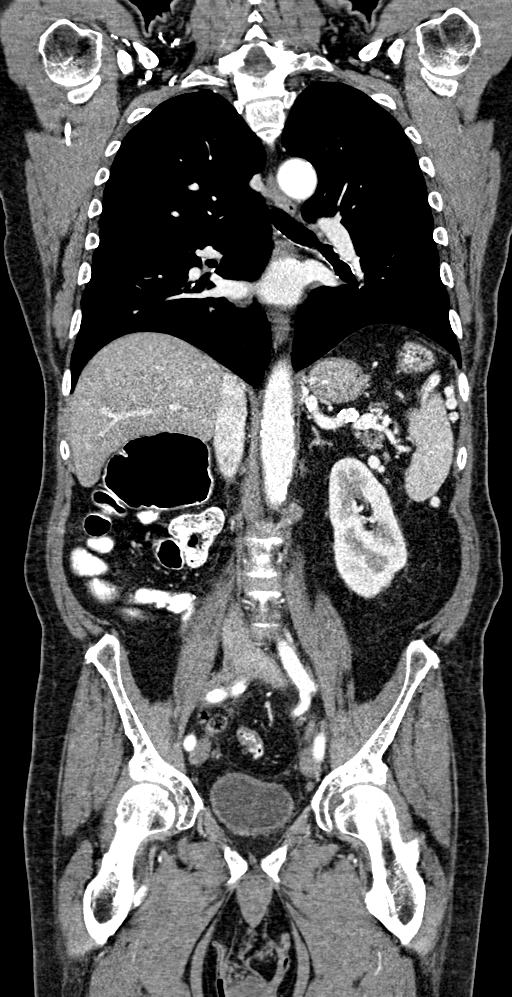

[12 of 36 positions shown; findings below may reference images not displayed]

FINDINGS: CT CHEST FINDINGS

Cardiovascular: Right Port-A-Cath tip high right atrium. Aortic
atherosclerosis. Tortuous thoracic aorta. Normal heart size, without
pericardial effusion. Multivessel coronary artery atherosclerosis.
No central pulmonary embolism, on this non-dedicated study.

Mediastinum/Nodes: Tiny bilateral thyroid nodules are similar. Not
clinically significant; no follow-up imaging recommended (ref: [HOSPITAL]. [DATE]): 143-50).No supraclavicular adenopathy.
No mediastinal or hilar adenopathy. The retrocrural node is upper
normal size, similar at 6 mm on 60/2.

Lungs/Pleura: No pleural fluid.  Clear lungs.

Musculoskeletal: No acute osseous abnormality. Presumed bone island
within the inferior aspect of the T11 vertebral body. Mild T12
superior endplate irregularity is unchanged.

CT ABDOMEN PELVIS FINDINGS

Hepatobiliary: Normal liver. Small gallstones without acute
cholecystitis or biliary duct dilatation.

Pancreas: Chronic calcific pancreatitis, as evidenced by parenchymal
calcifications. No duct dilatation. Tiny cystic foci within the
pancreatic tail are slightly less distinct today, including at up to
6 mm on 65/2. No peripancreatic edema.

Spleen: Normal in size, without focal abnormality.

Adrenals/Urinary Tract: Normal left adrenal gland. Right
nephrectomy, without locally recurrent disease. Too small to
characterize left renal lesions. No suspicious renal mass. Moderate
left renal collecting system opacification. Good abdominal ureteric
opacification. Delayed images not performed of the pelvis. No
abdominal ureteric or collecting system filling defect identified.

No enhancing bladder mass.

Stomach/Bowel: Normal stomach, without wall thickening. Scattered
colonic diverticula. Normal small bowel.

Vascular/Lymphatic: Aortic atherosclerosis.

Left upper quadrant portosystemic collaterals likely relate to
remote splenic vein insufficiency.

Index left periaortic node measures 9 mm on 88/2 versus 11 mm on the
prior.

Preaortic node measures 8 mm on 78/2 versus 12 mm on the prior

No pelvic sidewall adenopathy.

Reproductive: Normal prostate.

Other: No significant free fluid. Small fat containing left inguinal
hernia. Ventral abdominal wall laxity containing fat.

Musculoskeletal: Lumbosacral spondylosis.
IMPRESSION: 1. Response to therapy, as evidenced by decrease in abdominal
adenopathy.
2. No new or progressive disease.
3. Similar borderline retrocrural adenopathy.  Indeterminate.
4. Coronary artery atherosclerosis. Aortic Atherosclerosis
([7L]-[7L]).
5. Chronic calcific pancreatitis. Less distinct tiny low-density
foci within the pancreatic tail are likely pseudocysts.
6. Cholelithiasis.

## 2020-08-31 MED ORDER — IOHEXOL 300 MG/ML  SOLN
100.0000 mL | Freq: Once | INTRAMUSCULAR | Status: AC | PRN
  Administered 2020-08-31: 17:00:00 100 mL via INTRAVENOUS

## 2020-08-31 MED ORDER — SODIUM CHLORIDE (PF) 0.9 % IJ SOLN
INTRAMUSCULAR | Status: AC
  Filled 2020-08-31: qty 50

## 2020-09-05 ENCOUNTER — Inpatient Hospital Stay: Payer: Medicare Other

## 2020-09-05 ENCOUNTER — Inpatient Hospital Stay (HOSPITAL_BASED_OUTPATIENT_CLINIC_OR_DEPARTMENT_OTHER): Payer: Medicare Other | Admitting: Oncology

## 2020-09-05 ENCOUNTER — Other Ambulatory Visit: Payer: Self-pay

## 2020-09-05 VITALS — BP 131/73 | HR 74 | Temp 97.0°F | Resp 18 | Ht 70.5 in | Wt 183.0 lb

## 2020-09-05 DIAGNOSIS — D4959 Neoplasm of unspecified behavior of other genitourinary organ: Secondary | ICD-10-CM

## 2020-09-05 DIAGNOSIS — Z95828 Presence of other vascular implants and grafts: Secondary | ICD-10-CM | POA: Diagnosis not present

## 2020-09-05 DIAGNOSIS — Z5111 Encounter for antineoplastic chemotherapy: Secondary | ICD-10-CM | POA: Diagnosis not present

## 2020-09-05 DIAGNOSIS — C680 Malignant neoplasm of urethra: Secondary | ICD-10-CM | POA: Diagnosis not present

## 2020-09-05 DIAGNOSIS — Z7984 Long term (current) use of oral hypoglycemic drugs: Secondary | ICD-10-CM | POA: Diagnosis not present

## 2020-09-05 DIAGNOSIS — C772 Secondary and unspecified malignant neoplasm of intra-abdominal lymph nodes: Secondary | ICD-10-CM | POA: Diagnosis not present

## 2020-09-05 DIAGNOSIS — Z79899 Other long term (current) drug therapy: Secondary | ICD-10-CM | POA: Diagnosis not present

## 2020-09-05 LAB — CMP (CANCER CENTER ONLY)
ALT: 17 U/L (ref 0–44)
AST: 16 U/L (ref 15–41)
Albumin: 3.6 g/dL (ref 3.5–5.0)
Alkaline Phosphatase: 97 U/L (ref 38–126)
Anion gap: 8 (ref 5–15)
BUN: 20 mg/dL (ref 8–23)
CO2: 22 mmol/L (ref 22–32)
Calcium: 8.7 mg/dL — ABNORMAL LOW (ref 8.9–10.3)
Chloride: 108 mmol/L (ref 98–111)
Creatinine: 1.34 mg/dL — ABNORMAL HIGH (ref 0.61–1.24)
GFR, Estimated: 55 mL/min — ABNORMAL LOW (ref >60)
Glucose, Bld: 296 mg/dL — ABNORMAL HIGH (ref 70–99)
Potassium: 4.8 mmol/L (ref 3.5–5.1)
Sodium: 138 mmol/L (ref 135–145)
Total Bilirubin: 0.4 mg/dL (ref 0.3–1.2)
Total Protein: 6.9 g/dL (ref 6.5–8.1)

## 2020-09-05 LAB — CBC WITH DIFFERENTIAL (CANCER CENTER ONLY)
Abs Immature Granulocytes: 0.01 10*3/uL (ref 0.00–0.07)
Basophils Absolute: 0 10*3/uL (ref 0.0–0.1)
Basophils Relative: 1 %
Eosinophils Absolute: 0.1 10*3/uL (ref 0.0–0.5)
Eosinophils Relative: 3 %
HCT: 30.8 % — ABNORMAL LOW (ref 39.0–52.0)
Hemoglobin: 10.3 g/dL — ABNORMAL LOW (ref 13.0–17.0)
Immature Granulocytes: 0 %
Lymphocytes Relative: 27 %
Lymphs Abs: 0.9 10*3/uL (ref 0.7–4.0)
MCH: 29.7 pg (ref 26.0–34.0)
MCHC: 33.4 g/dL (ref 30.0–36.0)
MCV: 88.8 fL (ref 80.0–100.0)
Monocytes Absolute: 0.4 10*3/uL (ref 0.1–1.0)
Monocytes Relative: 14 %
Neutro Abs: 1.8 10*3/uL (ref 1.7–7.7)
Neutrophils Relative %: 55 %
Platelet Count: 239 10*3/uL (ref 150–400)
RBC: 3.47 MIL/uL — ABNORMAL LOW (ref 4.22–5.81)
RDW: 17.9 % — ABNORMAL HIGH (ref 11.5–15.5)
WBC Count: 3.2 10*3/uL — ABNORMAL LOW (ref 4.0–10.5)
nRBC: 0 % (ref 0.0–0.2)

## 2020-09-05 MED ORDER — SODIUM CHLORIDE 0.9 % IV SOLN
150.0000 mg | Freq: Once | INTRAVENOUS | Status: AC
  Administered 2020-09-05: 11:00:00 150 mg via INTRAVENOUS
  Filled 2020-09-05: qty 150

## 2020-09-05 MED ORDER — SODIUM CHLORIDE 0.9% FLUSH
10.0000 mL | Freq: Once | INTRAVENOUS | Status: AC
  Administered 2020-09-05: 09:00:00 10 mL
  Filled 2020-09-05: qty 10

## 2020-09-05 MED ORDER — PALONOSETRON HCL INJECTION 0.25 MG/5ML
INTRAVENOUS | Status: AC
  Filled 2020-09-05: qty 5

## 2020-09-05 MED ORDER — SODIUM CHLORIDE 0.9 % IV SOLN
Freq: Once | INTRAVENOUS | Status: AC
Start: 2020-09-05 — End: 2020-09-05
  Filled 2020-09-05: qty 250

## 2020-09-05 MED ORDER — PALONOSETRON HCL INJECTION 0.25 MG/5ML
0.2500 mg | Freq: Once | INTRAVENOUS | Status: AC
  Administered 2020-09-05: 10:00:00 0.25 mg via INTRAVENOUS

## 2020-09-05 MED ORDER — SODIUM CHLORIDE 0.9% FLUSH
10.0000 mL | INTRAVENOUS | Status: DC | PRN
  Administered 2020-09-05: 13:00:00 10 mL
  Filled 2020-09-05: qty 10

## 2020-09-05 MED ORDER — HEPARIN SOD (PORK) LOCK FLUSH 100 UNIT/ML IV SOLN
500.0000 [IU] | Freq: Once | INTRAVENOUS | Status: AC | PRN
  Administered 2020-09-05: 13:00:00 500 [IU]
  Filled 2020-09-05: qty 5

## 2020-09-05 MED ORDER — SODIUM CHLORIDE 0.9 % IV SOLN
310.0000 mg | Freq: Once | INTRAVENOUS | Status: AC
  Administered 2020-09-05: 12:00:00 310 mg via INTRAVENOUS
  Filled 2020-09-05: qty 31

## 2020-09-05 MED ORDER — SODIUM CHLORIDE 0.9 % IV SOLN
10.0000 mg | Freq: Once | INTRAVENOUS | Status: AC
  Administered 2020-09-05: 10:00:00 10 mg via INTRAVENOUS
  Filled 2020-09-05: qty 10

## 2020-09-05 MED ORDER — SODIUM CHLORIDE 0.9 % IV SOLN
800.0000 mg/m2 | Freq: Once | INTRAVENOUS | Status: AC
  Administered 2020-09-05: 12:00:00 1596 mg via INTRAVENOUS
  Filled 2020-09-05: qty 41.98

## 2020-09-05 NOTE — Progress Notes (Signed)
Hematology and Oncology Follow Up Visit  Hector Lopez [id number] 07/02/1946 74 y.o. 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   Prior Therapy:  He underwent robotic assisted laparoscopic  nephroureterectomy completed on Feb 10, 2020.  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  He was found to have metastatic pericaval lymph node as well as a right common iliac lymph node.  Current therapy: Carboplatin and gemcitabine started on July 04, 2020. He is here for day 1 cycle 4 of therapy.   Interim History: Hector Lopez presents today for a follow-up visit.  Since the last visit, he reports no major changes in his health.  He has tolerated the last cycle of chemotherapy without any major complaints.  He denies any nausea vomiting or abdominal pain.  He denies any recent hospitalization or illnesses.  His performance status and quality of life remained excellent.  He denies any worsening neuropathy or fatigue.     Medications: Unchanged on review. Current Outpatient Medications  Medication Sig Dispense Refill  . amLODipine (NORVASC) 10 MG tablet Take 10 mg by mouth at bedtime.     . cetirizine (ZYRTEC) 10 MG tablet Take 10 mg by mouth daily.    . finasteride (PROSCAR) 5 MG tablet Take 5 mg by mouth daily.    Marland Kitchen glipiZIDE (GLUCOTROL) 10 MG tablet Take 10 mg by mouth daily before breakfast.     . lidocaine-prilocaine (EMLA) cream Apply 1 application topically as needed. 30 g 0  . lisinopril (ZESTRIL) 40 MG tablet Take 40 mg by mouth at bedtime.     . metFORMIN (GLUCOPHAGE) 1000 MG tablet Take 1,000 mg by mouth 2 (two) times daily with a meal.     . omeprazole (PRILOSEC) 40 MG  capsule Take 40 mg by mouth daily.    Marland Kitchen oxyCODONE-acetaminophen (PERCOCET) 5-325 MG tablet Take 1 tablet by mouth every 6 (six) hours as needed for moderate pain or severe pain. Post-operatively (Patient not taking: Reported on 06/20/2020) 15 tablet 0  . prochlorperazine (COMPAZINE) 10 MG tablet Take 1 tablet (10 mg total) by mouth every 6 (six) hours as needed for nausea or vomiting. 30 tablet 0  . senna-docusate (SENOKOT-S) 8.6-50 MG tablet Take 1 tablet by mouth 2 (two) times daily. While taking strong pain meds to prevent cosntipation. 10 tablet 0  . sertraline (ZOLOFT) 50 MG tablet Take 50 mg by mouth daily.    . simvastatin (ZOCOR) 20 MG tablet Take 20 mg by mouth daily.     Marland Kitchen sulfamethoxazole-trimethoprim (BACTRIM DS) 800-160 MG tablet Take 1 tablet by mouth daily. Start taking one day prior to your appointment for your first follow-up and catheter removal.  Continue taking for three days. (Patient not taking: Reported on 06/20/2020) 3 tablet 0   No current facility-administered medications for this visit.     Allergies:  Allergies  Allergen Reactions  . Codeine Nausea And Vomiting      Physical Exam:  Blood pressure 131/73, pulse 74, temperature (!) 97 F (36.1 C), temperature source Tympanic, resp. rate 18, height 5' 10.5" (1.791 m), weight 183 lb (83 kg), SpO2 100 %.    ECOG: 1    General appearance: Alert, awake without any distress. Head: Atraumatic without abnormalities Oropharynx:  Without any thrush or ulcers. Eyes: No scleral icterus. Lymph nodes: No lymphadenopathy noted in the cervical, supraclavicular, or axillary nodes Heart:regular rate and rhythm, without any murmurs or gallops.   Lung: Clear to auscultation without any rhonchi, wheezes or dullness to percussion. Abdomin: Soft, nontender without any shifting dullness or ascites. Musculoskeletal: No clubbing or cyanosis. Neurological: No motor or sensory deficits. Skin: No rashes or lesions.          Lab Results: Lab Results  Component Value Date   WBC 1.3 (L) 08/22/2020   HGB 9.8 (L) 08/22/2020   HCT 29.1 (L) 08/22/2020   MCV 86.4 08/22/2020   PLT 112 (L) 08/22/2020     Chemistry      Component Value Date/Time   NA 137 08/22/2020 1038   K 4.8 08/22/2020 1038   CL 107 08/22/2020 1038   CO2 23 08/22/2020 1038   BUN 19 08/22/2020 1038   CREATININE 1.32 (H) 08/22/2020 1038      Component Value Date/Time   CALCIUM 9.2 08/22/2020 1038   ALKPHOS 101 08/22/2020 1038   AST 22 08/22/2020 1038   ALT 21 08/22/2020 1038   BILITOT 0.5 08/22/2020 1038     IMPRESSION: 1. Response to therapy, as evidenced by decrease in abdominal adenopathy. 2. No new or progressive disease. 3. Similar borderline retrocrural adenopathy.  Indeterminate. 4. Coronary artery atherosclerosis. Aortic Atherosclerosis (ICD10-I70.0). 5. Chronic calcific pancreatitis. Less distinct tiny low-density foci within the pancreatic tail are likely pseudocysts. 6. Cholelithiasis.    Impression and Plan:  74 year old man with:  1.    Stage IV high-grade urothelial carcinoma arising from the right renal pelvis with lymphadenopathy diagnosed in September 2021.  He is currently on systemic chemotherapy with gemcitabine and carboplatin with reasonable response to therapy.  CT scan obtained on 08/31/2020 was personally reviewed and showed reduction in his lymphadenopathy.  Risks and benefits of continuing this treatment were discussed at this time.  I recommended continuing a total of 6 cycles and subsequently consider switch maintenance with Pembrolizumab.  He is agreeable to proceed with this plan.  2. IV access: Port-A-Cath remains in place without any issues at this time.  3. Antiemetics: Compazine is available to him without any nausea or vomiting.  4. Renal function surveillance:  Creatinine clearance remained stable at this time we will continue to monitor on platinum therapy.  5. Goals  of care:Aggressive measures are warranted although his disease is unlikely to be curable.  6.  Neutropenia: He has not been able to receive day 8 of therapy which we will cancel moving forward.  7. Follow-up: Will be in 3 weeks for the start of cycle 5.  30  minutes were dedicated to this encounter.  Time spent on reviewing imaging studies, disease status update and future plan of care discussion.    Zola Button, MD 12/22/20218:09 AM

## 2020-09-05 NOTE — Patient Instructions (Signed)

## 2020-09-05 NOTE — Patient Instructions (Signed)
[facility] Discharge Instructions for Patients Receiving Chemotherapy  Today you received the following chemotherapy agents: gemcitabine, carboplatin  To help prevent nausea and vomiting after your treatment, we encourage you to take your nausea medication as prescribed by your physician.    If you develop nausea and vomiting that is not controlled by your nausea medication, call the clinic.   BELOW ARE SYMPTOMS THAT SHOULD BE REPORTED IMMEDIATELY:  *FEVER GREATER THAN 100.5 F  *CHILLS WITH OR WITHOUT FEVER  NAUSEA AND VOMITING THAT IS NOT CONTROLLED WITH YOUR NAUSEA MEDICATION  *UNUSUAL SHORTNESS OF BREATH  *UNUSUAL BRUISING OR BLEEDING  TENDERNESS IN MOUTH AND THROAT WITH OR WITHOUT PRESENCE OF ULCERS  *URINARY PROBLEMS  *BOWEL PROBLEMS  UNUSUAL RASH Items with * indicate a potential emergency and should be followed up as soon as possible.  Feel free to call the clinic should you have any questions or concerns. The clinic phone number is [phone].  Please show the CHEMO ALERT CARD at check-in to the Emergency Department and triage nurse.  Gemcitabine injection What is this medicine? GEMCITABINE (jem SYE ta been) is a chemotherapy drug. This medicine is used to treat many types of cancer like breast cancer, lung cancer, pancreatic cancer, and ovarian cancer. This medicine may be used for other purposes; ask your health care provider or pharmacist if you have questions. COMMON BRAND NAME(S): Gemzar, Infugem What should I tell my health care provider before I take this medicine? They need to know if you have any of these conditions:  blood disorders  infection  kidney disease  liver disease  lung or breathing disease, like asthma  recent or ongoing radiation therapy  an unusual or allergic reaction to gemcitabine, other chemotherapy, other medicines, foods, dyes, or preservatives  pregnant or trying to get pregnant  breast-feeding How  should I use this medicine? This drug is given as an infusion into a vein. It is administered in a hospital or clinic by a specially trained health care professional. Talk to your pediatrician regarding the use of this medicine in children. Special care may be needed. Overdosage: If you think you have taken too much of this medicine contact a poison control center or emergency room at once. NOTE: This medicine is only for you. Do not share this medicine with others. What if I miss a dose? It is important not to miss your dose. Call your doctor or health care professional if you are unable to keep an appointment. What may interact with this medicine?  medicines to increase blood counts like filgrastim, pegfilgrastim, sargramostim  some other chemotherapy drugs like cisplatin  vaccines Talk to your doctor or health care professional before taking any of these medicines:  acetaminophen  aspirin  ibuprofen  ketoprofen  naproxen This list may not describe all possible interactions. Give your health care provider a list of all the medicines, herbs, non-prescription drugs, or dietary supplements you use. Also tell them if you smoke, drink alcohol, or use illegal drugs. Some items may interact with your medicine. What should I watch for while using this medicine? Visit your doctor for checks on your progress. This drug may make you feel generally unwell. This is not uncommon, as chemotherapy can affect healthy cells as well as cancer cells. Report any side effects. Continue your course of treatment even though you feel ill unless your doctor tells you to stop. In some cases, you may be given additional medicines to help with side effects. Follow all   directions for their use. Call your doctor or health care professional for advice if you get a fever, chills or sore throat, or other symptoms of a cold or flu. Do not treat yourself. This drug decreases your body's ability to fight infections. Try  to avoid being around people who are sick. This medicine may increase your risk to bruise or bleed. Call your doctor or health care professional if you notice any unusual bleeding. Be careful brushing and flossing your teeth or using a toothpick because you may get an infection or bleed more easily. If you have any dental work done, tell your dentist you are receiving this medicine. Avoid taking products that contain aspirin, acetaminophen, ibuprofen, naproxen, or ketoprofen unless instructed by your doctor. These medicines may hide a fever. Do not become pregnant while taking this medicine or for 6 months after stopping it. Women should inform their doctor if they wish to become pregnant or think they might be pregnant. Men should not father a child while taking this medicine and for 3 months after stopping it. There is a potential for serious side effects to an unborn child. Talk to your health care professional or pharmacist for more information. Do not breast-feed an infant while taking this medicine or for at least 1 week after stopping it. Men should inform their doctors if they wish to father a child. This medicine may lower sperm counts. Talk with your doctor or health care professional if you are concerned about your fertility. What side effects may I notice from receiving this medicine? Side effects that you should report to your doctor or health care professional as soon as possible:  allergic reactions like skin rash, itching or hives, swelling of the face, lips, or tongue  breathing problems  pain, redness, or irritation at site where injected  signs and symptoms of a dangerous change in heartbeat or heart rhythm like chest pain; dizziness; fast or irregular heartbeat; palpitations; feeling faint or lightheaded, falls; breathing problems  signs of decreased platelets or bleeding - bruising, pinpoint red spots on the skin, black, tarry stools, blood in the urine  signs of decreased red  blood cells - unusually weak or tired, feeling faint or lightheaded, falls  signs of infection - fever or chills, cough, sore throat, pain or difficulty passing urine  signs and symptoms of kidney injury like trouble passing urine or change in the amount of urine  signs and symptoms of liver injury like dark yellow or brown urine; general ill feeling or flu-like symptoms; light-colored stools; loss of appetite; nausea; right upper belly pain; unusually weak or tired; yellowing of the eyes or skin  swelling of ankles, feet, hands Side effects that usually do not require medical attention (report to your doctor or health care professional if they continue or are bothersome):  constipation  diarrhea  hair loss  loss of appetite  nausea  rash  vomiting This list may not describe all possible side effects. Call your doctor for medical advice about side effects. You may report side effects to FDA at 1-800-FDA-1088. Where should I keep my medicine? This drug is given in a hospital or clinic and will not be stored at home. NOTE: This sheet is a summary. It may not cover all possible information. If you have questions about this medicine, talk to your doctor, pharmacist, or health care provider.  2020 Elsevier/Gold Standard (2017-11-25 18:06:11)  Carboplatin injection What is this medicine? CARBOPLATIN (KAR boe pla tin) is a chemotherapy drug.   It targets fast dividing cells, like cancer cells, and causes these cells to die. This medicine is used to treat ovarian cancer and many other cancers. This medicine may be used for other purposes; ask your health care provider or pharmacist if you have questions. COMMON BRAND NAME(S): Paraplatin What should I tell my health care provider before I take this medicine? They need to know if you have any of these conditions:  blood disorders  hearing problems  kidney disease  recent or ongoing radiation therapy  an unusual or allergic reaction  to carboplatin, cisplatin, other chemotherapy, other medicines, foods, dyes, or preservatives  pregnant or trying to get pregnant  breast-feeding How should I use this medicine? This drug is usually given as an infusion into a vein. It is administered in a hospital or clinic by a specially trained health care professional. Talk to your pediatrician regarding the use of this medicine in children. Special care may be needed. Overdosage: If you think you have taken too much of this medicine contact a poison control center or emergency room at once. NOTE: This medicine is only for you. Do not share this medicine with others. What if I miss a dose? It is important not to miss a dose. Call your doctor or health care professional if you are unable to keep an appointment. What may interact with this medicine?  medicines for seizures  medicines to increase blood counts like filgrastim, pegfilgrastim, sargramostim  some antibiotics like amikacin, gentamicin, neomycin, streptomycin, tobramycin  vaccines Talk to your doctor or health care professional before taking any of these medicines:  acetaminophen  aspirin  ibuprofen  ketoprofen  naproxen This list may not describe all possible interactions. Give your health care provider a list of all the medicines, herbs, non-prescription drugs, or dietary supplements you use. Also tell them if you smoke, drink alcohol, or use illegal drugs. Some items may interact with your medicine. What should I watch for while using this medicine? Your condition will be monitored carefully while you are receiving this medicine. You will need important blood work done while you are taking this medicine. This drug may make you feel generally unwell. This is not uncommon, as chemotherapy can affect healthy cells as well as cancer cells. Report any side effects. Continue your course of treatment even though you feel ill unless your doctor tells you to stop. In some  cases, you may be given additional medicines to help with side effects. Follow all directions for their use. Call your doctor or health care professional for advice if you get a fever, chills or sore throat, or other symptoms of a cold or flu. Do not treat yourself. This drug decreases your body's ability to fight infections. Try to avoid being around people who are sick. This medicine may increase your risk to bruise or bleed. Call your doctor or health care professional if you notice any unusual bleeding. Be careful brushing and flossing your teeth or using a toothpick because you may get an infection or bleed more easily. If you have any dental work done, tell your dentist you are receiving this medicine. Avoid taking products that contain aspirin, acetaminophen, ibuprofen, naproxen, or ketoprofen unless instructed by your doctor. These medicines may hide a fever. Do not become pregnant while taking this medicine. Women should inform their doctor if they wish to become pregnant or think they might be pregnant. There is a potential for serious side effects to an unborn child. Talk to your health care   professional or pharmacist for more information. Do not breast-feed an infant while taking this medicine. What side effects may I notice from receiving this medicine? Side effects that you should report to your doctor or health care professional as soon as possible:  allergic reactions like skin rash, itching or hives, swelling of the face, lips, or tongue  signs of infection - fever or chills, cough, sore throat, pain or difficulty passing urine  signs of decreased platelets or bleeding - bruising, pinpoint red spots on the skin, black, tarry stools, nosebleeds  signs of decreased red blood cells - unusually weak or tired, fainting spells, lightheadedness  breathing problems  changes in hearing  changes in vision  chest pain  high blood pressure  low blood counts - This drug may decrease  the number of white blood cells, red blood cells and platelets. You may be at increased risk for infections and bleeding.  nausea and vomiting  pain, swelling, redness or irritation at the injection site  pain, tingling, numbness in the hands or feet  problems with balance, talking, walking  trouble passing urine or change in the amount of urine Side effects that usually do not require medical attention (report to your doctor or health care professional if they continue or are bothersome):  hair loss  loss of appetite  metallic taste in the mouth or changes in taste This list may not describe all possible side effects. Call your doctor for medical advice about side effects. You may report side effects to FDA at 1-800-FDA-1088. Where should I keep my medicine? This drug is given in a hospital or clinic and will not be stored at home. NOTE: This sheet is a summary. It may not cover all possible information. If you have questions about this medicine, talk to your doctor, pharmacist, or health care provider.  2020 Elsevier/Gold Standard (2007-12-07 14:38:05)   

## 2020-09-06 ENCOUNTER — Telehealth: Payer: Self-pay | Admitting: Oncology

## 2020-09-06 NOTE — Telephone Encounter (Signed)
Scheduled per 12/22 los, patient has been called and notified. 

## 2020-09-12 ENCOUNTER — Inpatient Hospital Stay: Payer: Medicare Other

## 2020-09-24 ENCOUNTER — Telehealth: Payer: Self-pay | Admitting: Oncology

## 2020-09-24 NOTE — Telephone Encounter (Signed)
Called patient regarding upcoming appointments on 01/12, patient has been called and voicemail was left. 

## 2020-09-26 ENCOUNTER — Other Ambulatory Visit: Payer: Self-pay

## 2020-09-26 ENCOUNTER — Inpatient Hospital Stay: Payer: Medicare Other

## 2020-09-26 ENCOUNTER — Inpatient Hospital Stay (HOSPITAL_BASED_OUTPATIENT_CLINIC_OR_DEPARTMENT_OTHER): Payer: Medicare Other | Admitting: Oncology

## 2020-09-26 ENCOUNTER — Inpatient Hospital Stay: Payer: Medicare Other | Attending: Oncology

## 2020-09-26 VITALS — BP 143/79 | HR 73 | Temp 97.7°F | Resp 18 | Ht 70.0 in | Wt 183.4 lb

## 2020-09-26 DIAGNOSIS — D709 Neutropenia, unspecified: Secondary | ICD-10-CM

## 2020-09-26 DIAGNOSIS — Z5189 Encounter for other specified aftercare: Secondary | ICD-10-CM | POA: Insufficient documentation

## 2020-09-26 DIAGNOSIS — Z7984 Long term (current) use of oral hypoglycemic drugs: Secondary | ICD-10-CM | POA: Diagnosis not present

## 2020-09-26 DIAGNOSIS — C680 Malignant neoplasm of urethra: Secondary | ICD-10-CM | POA: Diagnosis not present

## 2020-09-26 DIAGNOSIS — D701 Agranulocytosis secondary to cancer chemotherapy: Secondary | ICD-10-CM | POA: Insufficient documentation

## 2020-09-26 DIAGNOSIS — Z79899 Other long term (current) drug therapy: Secondary | ICD-10-CM | POA: Insufficient documentation

## 2020-09-26 DIAGNOSIS — C772 Secondary and unspecified malignant neoplasm of intra-abdominal lymph nodes: Secondary | ICD-10-CM | POA: Insufficient documentation

## 2020-09-26 DIAGNOSIS — Z5111 Encounter for antineoplastic chemotherapy: Secondary | ICD-10-CM | POA: Diagnosis not present

## 2020-09-26 DIAGNOSIS — Z95828 Presence of other vascular implants and grafts: Secondary | ICD-10-CM

## 2020-09-26 DIAGNOSIS — T451X5A Adverse effect of antineoplastic and immunosuppressive drugs, initial encounter: Secondary | ICD-10-CM | POA: Insufficient documentation

## 2020-09-26 DIAGNOSIS — D4959 Neoplasm of unspecified behavior of other genitourinary organ: Secondary | ICD-10-CM

## 2020-09-26 LAB — CMP (CANCER CENTER ONLY)
ALT: 12 U/L (ref 0–44)
AST: 17 U/L (ref 15–41)
Albumin: 3.6 g/dL (ref 3.5–5.0)
Alkaline Phosphatase: 89 U/L (ref 38–126)
Anion gap: 5 (ref 5–15)
BUN: 21 mg/dL (ref 8–23)
CO2: 24 mmol/L (ref 22–32)
Calcium: 9 mg/dL (ref 8.9–10.3)
Chloride: 107 mmol/L (ref 98–111)
Creatinine: 1.32 mg/dL — ABNORMAL HIGH (ref 0.61–1.24)
GFR, Estimated: 56 mL/min — ABNORMAL LOW (ref >60)
Glucose, Bld: 271 mg/dL — ABNORMAL HIGH (ref 70–99)
Potassium: 4.8 mmol/L (ref 3.5–5.1)
Sodium: 136 mmol/L (ref 135–145)
Total Bilirubin: 0.6 mg/dL (ref 0.3–1.2)
Total Protein: 6.9 g/dL (ref 6.5–8.1)

## 2020-09-26 LAB — CBC WITH DIFFERENTIAL (CANCER CENTER ONLY)
Abs Immature Granulocytes: 0.01 10*3/uL (ref 0.00–0.07)
Basophils Absolute: 0 10*3/uL (ref 0.0–0.1)
Basophils Relative: 1 %
Eosinophils Absolute: 0 10*3/uL (ref 0.0–0.5)
Eosinophils Relative: 2 %
HCT: 29.7 % — ABNORMAL LOW (ref 39.0–52.0)
Hemoglobin: 10 g/dL — ABNORMAL LOW (ref 13.0–17.0)
Immature Granulocytes: 1 %
Lymphocytes Relative: 33 %
Lymphs Abs: 0.7 10*3/uL (ref 0.7–4.0)
MCH: 30.5 pg (ref 26.0–34.0)
MCHC: 33.7 g/dL (ref 30.0–36.0)
MCV: 90.5 fL (ref 80.0–100.0)
Monocytes Absolute: 0.3 10*3/uL (ref 0.1–1.0)
Monocytes Relative: 12 %
Neutro Abs: 1 10*3/uL — ABNORMAL LOW (ref 1.7–7.7)
Neutrophils Relative %: 51 %
Platelet Count: 173 10*3/uL (ref 150–400)
RBC: 3.28 MIL/uL — ABNORMAL LOW (ref 4.22–5.81)
RDW: 17.7 % — ABNORMAL HIGH (ref 11.5–15.5)
WBC Count: 2 10*3/uL — ABNORMAL LOW (ref 4.0–10.5)
nRBC: 0 % (ref 0.0–0.2)

## 2020-09-26 MED ORDER — SODIUM CHLORIDE 0.9 % IV SOLN
800.0000 mg/m2 | Freq: Once | INTRAVENOUS | Status: AC
  Administered 2020-09-26: 1596 mg via INTRAVENOUS
  Filled 2020-09-26: qty 41.98

## 2020-09-26 MED ORDER — HEPARIN SOD (PORK) LOCK FLUSH 100 UNIT/ML IV SOLN
500.0000 [IU] | Freq: Once | INTRAVENOUS | Status: AC | PRN
  Administered 2020-09-26: 500 [IU]
  Filled 2020-09-26: qty 5

## 2020-09-26 MED ORDER — SODIUM CHLORIDE 0.9 % IV SOLN
10.0000 mg | Freq: Once | INTRAVENOUS | Status: AC
  Administered 2020-09-26: 10 mg via INTRAVENOUS
  Filled 2020-09-26: qty 10

## 2020-09-26 MED ORDER — SODIUM CHLORIDE 0.9% FLUSH
10.0000 mL | Freq: Once | INTRAVENOUS | Status: AC
  Administered 2020-09-26: 10 mL
  Filled 2020-09-26: qty 10

## 2020-09-26 MED ORDER — SODIUM CHLORIDE 0.9 % IV SOLN
315.6000 mg | Freq: Once | INTRAVENOUS | Status: AC
Start: 2020-09-26 — End: 2020-09-26
  Administered 2020-09-26: 320 mg via INTRAVENOUS
  Filled 2020-09-26: qty 32

## 2020-09-26 MED ORDER — FAMOTIDINE 20 MG PO TABS
ORAL_TABLET | ORAL | Status: AC
  Filled 2020-09-26: qty 1

## 2020-09-26 MED ORDER — SODIUM CHLORIDE 0.9 % IV SOLN
Freq: Once | INTRAVENOUS | Status: AC
  Filled 2020-09-26: qty 250

## 2020-09-26 MED ORDER — PALONOSETRON HCL INJECTION 0.25 MG/5ML
INTRAVENOUS | Status: AC
  Filled 2020-09-26: qty 5

## 2020-09-26 MED ORDER — SODIUM CHLORIDE 0.9% FLUSH
10.0000 mL | INTRAVENOUS | Status: DC | PRN
  Administered 2020-09-26: 10 mL
  Filled 2020-09-26: qty 10

## 2020-09-26 MED ORDER — FAMOTIDINE 20 MG PO TABS
20.0000 mg | ORAL_TABLET | Freq: Once | ORAL | Status: AC
  Administered 2020-09-26: 20 mg via ORAL

## 2020-09-26 MED ORDER — PALONOSETRON HCL INJECTION 0.25 MG/5ML
0.2500 mg | Freq: Once | INTRAVENOUS | Status: AC
  Administered 2020-09-26: 0.25 mg via INTRAVENOUS

## 2020-09-26 MED ORDER — SODIUM CHLORIDE 0.9 % IV SOLN
150.0000 mg | Freq: Once | INTRAVENOUS | Status: AC
  Administered 2020-09-26: 150 mg via INTRAVENOUS
  Filled 2020-09-26: qty 150

## 2020-09-26 NOTE — Progress Notes (Signed)
Patient c/o indigestion and requested Pepcid 20mg  po, since that is what he takes at home and it is effective. Received a verbal order from Dr. Alen Blew.

## 2020-09-26 NOTE — Progress Notes (Signed)
Hematology and Oncology Follow Up Visit  Hector Lopez [id number] 1946-06-24 75 y.o. 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  Prior Therapy:  He underwent robotic assisted laparoscopic  nephroureterectomy completed on Feb 10, 2020.  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  He was found to have metastatic pericaval lymph node as well as a right common iliac lymph node.  Current therapy: Carboplatin and gemcitabine started on July 04, 2020. He is here for day 1 cycle 5 of therapy.   Interim History: Hector Lopez returns today for repeat evaluation.  Since the last visit, he reports no major changes in his health.  He denies any nausea, vomiting or abdominal pain.  He denies any hospitalization or illnesses.  He denies any fevers or chills.  His performance status quality of life remained excellent.     Medications: Updated on review. Current Outpatient Medications  Medication Sig Dispense Refill  . amLODipine (NORVASC) 10 MG tablet Take 10 mg by mouth at bedtime.     . cetirizine (ZYRTEC) 10 MG tablet Take 10 mg by mouth daily.    . finasteride (PROSCAR) 5 MG tablet Take 5 mg by mouth daily.    Marland Kitchen glipiZIDE (GLUCOTROL) 10 MG tablet Take 10 mg by mouth daily before breakfast.     . lidocaine-prilocaine (EMLA) cream Apply 1 application topically as needed. 30 g 0  . lisinopril (ZESTRIL) 40 MG tablet Take 40 mg by mouth at bedtime.     . metFORMIN (GLUCOPHAGE) 1000 MG tablet Take 1,000 mg by mouth 2 (two) times daily with a meal.     . omeprazole (PRILOSEC) 40 MG capsule Take 40 mg by mouth daily.    Marland Kitchen oxyCODONE-acetaminophen (PERCOCET) 5-325 MG tablet Take 1 tablet by mouth every 6 (six) hours as  needed for moderate pain or severe pain. Post-operatively (Patient not taking: Reported on 06/20/2020) 15 tablet 0  . prochlorperazine (COMPAZINE) 10 MG tablet Take 1 tablet (10 mg total) by mouth every 6 (six) hours as needed for nausea or vomiting. 30 tablet 0  . senna-docusate (SENOKOT-S) 8.6-50 MG tablet Take 1 tablet by mouth 2 (two) times daily. While taking strong pain meds to prevent cosntipation. 10 tablet 0  . sertraline (ZOLOFT) 50 MG tablet Take 50 mg by mouth daily.    . simvastatin (ZOCOR) 20 MG tablet Take 20 mg by mouth daily.     Marland Kitchen sulfamethoxazole-trimethoprim (BACTRIM DS) 800-160 MG tablet Take 1 tablet by mouth daily. Start taking one day prior to your appointment for your first follow-up and catheter removal.  Continue taking for three days. (Patient not taking: Reported on 06/20/2020) 3 tablet 0   No current facility-administered medications for this visit.     Allergies:  Allergies  Allergen Reactions  . Codeine Nausea And Vomiting      Physical Exam:  Blood pressure (!) 143/79, pulse 73, temperature 97.7 F (36.5 C), temperature source Tympanic, resp. rate 18, height 5\' 10"  (1.778 m), weight 183 lb 6.4 oz (83.2 kg), SpO2 99 %.  's   ECOG: 1   General appearance: Comfortable appearing without any discomfort Head: Normocephalic without any trauma Oropharynx: Mucous membranes are moist and pink without any thrush or ulcers. Eyes: Pupils are equal and round reactive to  light. Lymph nodes: No cervical, supraclavicular, inguinal or axillary lymphadenopathy.   Heart:regular rate and rhythm.  S1 and S2 without leg edema. Lung: Clear without any rhonchi or wheezes.  No dullness to percussion. Abdomin: Soft, nontender, nondistended with good bowel sounds.  No hepatosplenomegaly. Musculoskeletal: No joint deformity or effusion.  Full range of motion noted. Neurological: No deficits noted on motor, sensory and deep tendon reflex exam. Skin: No petechial rash or  dryness.  Appeared moist.  Psychiatric: Mood and affect appeared appropriate.            Lab Results: Lab Results  Component Value Date   WBC 3.2 (L) 09/05/2020   HGB 10.3 (L) 09/05/2020   HCT 30.8 (L) 09/05/2020   MCV 88.8 09/05/2020   PLT 239 09/05/2020     Chemistry      Component Value Date/Time   NA 138 09/05/2020 0856   K 4.8 09/05/2020 0856   CL 108 09/05/2020 0856   CO2 22 09/05/2020 0856   BUN 20 09/05/2020 0856   CREATININE 1.34 (H) 09/05/2020 0856      Component Value Date/Time   CALCIUM 8.7 (L) 09/05/2020 0856   ALKPHOS 97 09/05/2020 0856   AST 16 09/05/2020 0856   ALT 17 09/05/2020 0856   BILITOT 0.4 09/05/2020 0856         Impression and Plan:  75 year old man with:  1.    Right renal pelvis tumor diagnosed in May 2021.  He developed stage IV high-grade urothelial carcinoma in September 2021.  The natural course of his disease was updated and treatment options were reiterated at this time.  Given his excellent response to chemotherapy recommended completing 6 cycles of therapy before switching to maintenance Pembrolizumab.  Complications associated with his chemotherapy include nausea, vomiting, suppression among others were reviewed.  After discussion today he is agreeable to proceed with cycle 5 and will reevaluate in 3 weeks for cycle 6.  2. IV access: Port-A-Cath currently in use without any issues.  3. Antiemetics: No nausea or vomiting reported.  Compazine is available and has been successful.  4. Renal function surveillance:No issues reported with his creatinine clearance at this time.  We will continue to monitor on platinum therapy.  5. Goals of care: His disease is incurable although aggressive measures are warranted.  6.  Neutropenia: Resolved after discontinuation of week 8 of therapy.  I will add the growth factor support on day 3 of cycle 5 and cycle 6 of therapy.  7. Follow-up: In 3 weeks for cycle 6 of  therapy.  30  minutes were spent on this visit.  The time was dedicated to reviewing his disease status, discussing treatment options and complications of therapy.    Zola Button, MD 1/12/202211:27 AM

## 2020-09-26 NOTE — Patient Instructions (Signed)

## 2020-09-26 NOTE — Progress Notes (Signed)
Nutrition Assessment   Reason for Assessment:  Questions regarding blood glucose and foods to eat   ASSESSMENT:  75 year old male with stage IV urothelial carcinoma.  S/p robotic nephroureterectomy 5/21, DM. Patient receiving chemotherapy.   Met with patient during infusion.  Patient reports good appetite, overall.  Fatigued on day 4 following treatment.  Typically walks for exercise.  Interested in learning more about food and blood glucose control.    Medications: metformin, glipzide, senokot, prilosec, compazine   Labs: glucose 271, BUN 21, creatinine 1.32   Anthropometrics:   Height: 70.5 inches Weight: 183 lb Weight has been increasing BMI: 26   NUTRITION DIAGNOSIS: Food and nutrition related knowledge deficit related to diabetes as evidenced by request to meet with RD    INTERVENTION:  Discussed foods that contain carbohydrate, goal of carb per meal, and how to read food label with patient.  Handouts from [employer] provided to patient.  Encouraged continued exercise for better blood glucose control Contact information provided to patient and encouraged patient to call with questions.    Next Visit: no follow-up  Savian Mazon B. Zenia Resides, [place], [place] Registered Dietitian [phone] (mobile)

## 2020-09-26 NOTE — Progress Notes (Signed)
Per Dr. Alen Blew patient is ok to receive treatment today.

## 2020-09-27 ENCOUNTER — Telehealth: Payer: Self-pay | Admitting: Oncology

## 2020-09-27 NOTE — Telephone Encounter (Signed)
Scheduled per los, called patient and left a voicemail. 

## 2020-09-28 ENCOUNTER — Inpatient Hospital Stay: Payer: Medicare Other

## 2020-09-28 ENCOUNTER — Other Ambulatory Visit: Payer: Self-pay

## 2020-09-28 VITALS — BP 142/82 | HR 62 | Resp 18

## 2020-09-28 DIAGNOSIS — Z5189 Encounter for other specified aftercare: Secondary | ICD-10-CM | POA: Diagnosis not present

## 2020-09-28 DIAGNOSIS — D701 Agranulocytosis secondary to cancer chemotherapy: Secondary | ICD-10-CM | POA: Diagnosis not present

## 2020-09-28 DIAGNOSIS — D4959 Neoplasm of unspecified behavior of other genitourinary organ: Secondary | ICD-10-CM

## 2020-09-28 DIAGNOSIS — T451X5A Adverse effect of antineoplastic and immunosuppressive drugs, initial encounter: Secondary | ICD-10-CM | POA: Diagnosis not present

## 2020-09-28 DIAGNOSIS — C680 Malignant neoplasm of urethra: Secondary | ICD-10-CM | POA: Diagnosis not present

## 2020-09-28 DIAGNOSIS — Z5111 Encounter for antineoplastic chemotherapy: Secondary | ICD-10-CM | POA: Diagnosis not present

## 2020-09-28 DIAGNOSIS — C772 Secondary and unspecified malignant neoplasm of intra-abdominal lymph nodes: Secondary | ICD-10-CM | POA: Diagnosis not present

## 2020-09-28 MED ORDER — PEGFILGRASTIM-JMDB 6 MG/0.6ML ~~LOC~~ SOSY
6.0000 mg | PREFILLED_SYRINGE | Freq: Once | SUBCUTANEOUS | Status: AC
  Administered 2020-09-28: 6 mg via SUBCUTANEOUS

## 2020-09-28 MED ORDER — PEGFILGRASTIM-JMDB 6 MG/0.6ML ~~LOC~~ SOSY
PREFILLED_SYRINGE | SUBCUTANEOUS | Status: AC
  Filled 2020-09-28: qty 0.6

## 2020-09-28 NOTE — Patient Instructions (Signed)
Pegfilgrastim injection What is this medicine? PEGFILGRASTIM (PEG fil gra stim) is a long-acting granulocyte colony-stimulating factor that stimulates the growth of neutrophils, a type of white blood cell important in the body's fight against infection. It is used to reduce the incidence of fever and infection in patients with certain types of cancer who are receiving chemotherapy that affects the bone marrow, and to increase survival after being exposed to high doses of radiation. This medicine may be used for other purposes; ask your health care provider or pharmacist if you have questions. COMMON BRAND NAME(S): Fulphila, Neulasta, Nyvepria, UDENYCA, Ziextenzo What should I tell my health care provider before I take this medicine? They need to know if you have any of these conditions:  kidney disease  latex allergy  ongoing radiation therapy  sickle cell disease  skin reactions to acrylic adhesives (On-Body Injector only)  an unusual or allergic reaction to pegfilgrastim, filgrastim, other medicines, foods, dyes, or preservatives  pregnant or trying to get pregnant  breast-feeding How should I use this medicine? This medicine is for injection under the skin. If you get this medicine at home, you will be taught how to prepare and give the pre-filled syringe or how to use the On-body Injector. Refer to the patient Instructions for Use for detailed instructions. Use exactly as directed. Tell your healthcare provider immediately if you suspect that the On-body Injector may not have performed as intended or if you suspect the use of the On-body Injector resulted in a missed or partial dose. It is important that you put your used needles and syringes in a special sharps container. Do not put them in a trash can. If you do not have a sharps container, call your pharmacist or healthcare provider to get one. Talk to your pediatrician regarding the use of this medicine in children. While this drug  may be prescribed for selected conditions, precautions do apply. Overdosage: If you think you have taken too much of this medicine contact a poison control center or emergency room at once. NOTE: This medicine is only for you. Do not share this medicine with others. What if I miss a dose? It is important not to miss your dose. Call your doctor or health care professional if you miss your dose. If you miss a dose due to an On-body Injector failure or leakage, a new dose should be administered as soon as possible using a single prefilled syringe for manual use. What may interact with this medicine? Interactions have not been studied. This list may not describe all possible interactions. Give your health care provider a list of all the medicines, herbs, non-prescription drugs, or dietary supplements you use. Also tell them if you smoke, drink alcohol, or use illegal drugs. Some items may interact with your medicine. What should I watch for while using this medicine? Your condition will be monitored carefully while you are receiving this medicine. You may need blood work done while you are taking this medicine. Talk to your health care provider about your risk of cancer. You may be more at risk for certain types of cancer if you take this medicine. If you are going to need a MRI, CT scan, or other procedure, tell your doctor that you are using this medicine (On-Body Injector only). What side effects may I notice from receiving this medicine? Side effects that you should report to your doctor or health care professional as soon as possible:  allergic reactions (skin rash, itching or hives, swelling of   the face, lips, or tongue)  back pain  dizziness  fever  pain, redness, or irritation at site where injected  pinpoint red spots on the skin  red or dark-brown urine  shortness of breath or breathing problems  stomach or side pain, or pain at the shoulder  swelling  tiredness  trouble  passing urine or change in the amount of urine  unusual bruising or bleeding Side effects that usually do not require medical attention (report to your doctor or health care professional if they continue or are bothersome):  bone pain  muscle pain This list may not describe all possible side effects. Call your doctor for medical advice about side effects. You may report side effects to FDA at 1-800-FDA-1088. Where should I keep my medicine? Keep out of the reach of children. If you are using this medicine at home, you will be instructed on how to store it. Throw away any unused medicine after the expiration date on the label. NOTE: This sheet is a summary. It may not cover all possible information. If you have questions about this medicine, talk to your doctor, pharmacist, or health care provider.  2021 Elsevier/Gold Standard (2019-09-23 13:20:51)  

## 2020-10-03 ENCOUNTER — Ambulatory Visit: Payer: Medicare Other

## 2020-10-03 ENCOUNTER — Other Ambulatory Visit: Payer: Medicare Other

## 2020-10-17 ENCOUNTER — Inpatient Hospital Stay: Payer: Medicare Other

## 2020-10-17 ENCOUNTER — Inpatient Hospital Stay (HOSPITAL_BASED_OUTPATIENT_CLINIC_OR_DEPARTMENT_OTHER): Payer: Medicare Other | Admitting: Oncology

## 2020-10-17 ENCOUNTER — Inpatient Hospital Stay: Payer: Medicare Other | Attending: Oncology

## 2020-10-17 ENCOUNTER — Other Ambulatory Visit: Payer: Self-pay

## 2020-10-17 VITALS — BP 132/68 | HR 68 | Temp 97.7°F | Resp 18 | Wt 184.2 lb

## 2020-10-17 DIAGNOSIS — Z79899 Other long term (current) drug therapy: Secondary | ICD-10-CM | POA: Insufficient documentation

## 2020-10-17 DIAGNOSIS — D4959 Neoplasm of unspecified behavior of other genitourinary organ: Secondary | ICD-10-CM

## 2020-10-17 DIAGNOSIS — D701 Agranulocytosis secondary to cancer chemotherapy: Secondary | ICD-10-CM | POA: Diagnosis not present

## 2020-10-17 DIAGNOSIS — C680 Malignant neoplasm of urethra: Secondary | ICD-10-CM | POA: Insufficient documentation

## 2020-10-17 DIAGNOSIS — Z5111 Encounter for antineoplastic chemotherapy: Secondary | ICD-10-CM | POA: Insufficient documentation

## 2020-10-17 DIAGNOSIS — T451X5A Adverse effect of antineoplastic and immunosuppressive drugs, initial encounter: Secondary | ICD-10-CM | POA: Diagnosis not present

## 2020-10-17 DIAGNOSIS — Z95828 Presence of other vascular implants and grafts: Secondary | ICD-10-CM

## 2020-10-17 DIAGNOSIS — Z5189 Encounter for other specified aftercare: Secondary | ICD-10-CM | POA: Diagnosis not present

## 2020-10-17 DIAGNOSIS — C772 Secondary and unspecified malignant neoplasm of intra-abdominal lymph nodes: Secondary | ICD-10-CM | POA: Insufficient documentation

## 2020-10-17 LAB — CBC WITH DIFFERENTIAL (CANCER CENTER ONLY)
Abs Immature Granulocytes: 0.03 10*3/uL (ref 0.00–0.07)
Basophils Absolute: 0 10*3/uL (ref 0.0–0.1)
Basophils Relative: 1 %
Eosinophils Absolute: 0.1 10*3/uL (ref 0.0–0.5)
Eosinophils Relative: 2 %
HCT: 30 % — ABNORMAL LOW (ref 39.0–52.0)
Hemoglobin: 9.8 g/dL — ABNORMAL LOW (ref 13.0–17.0)
Immature Granulocytes: 1 %
Lymphocytes Relative: 20 %
Lymphs Abs: 1 10*3/uL (ref 0.7–4.0)
MCH: 30.5 pg (ref 26.0–34.0)
MCHC: 32.7 g/dL (ref 30.0–36.0)
MCV: 93.5 fL (ref 80.0–100.0)
Monocytes Absolute: 0.6 10*3/uL (ref 0.1–1.0)
Monocytes Relative: 11 %
Neutro Abs: 3.2 10*3/uL (ref 1.7–7.7)
Neutrophils Relative %: 65 %
Platelet Count: 247 10*3/uL (ref 150–400)
RBC: 3.21 MIL/uL — ABNORMAL LOW (ref 4.22–5.81)
RDW: 18.1 % — ABNORMAL HIGH (ref 11.5–15.5)
WBC Count: 4.9 10*3/uL (ref 4.0–10.5)
nRBC: 0 % (ref 0.0–0.2)

## 2020-10-17 LAB — CMP (CANCER CENTER ONLY)
ALT: 12 U/L (ref 0–44)
AST: 14 U/L — ABNORMAL LOW (ref 15–41)
Albumin: 3.6 g/dL (ref 3.5–5.0)
Alkaline Phosphatase: 112 U/L (ref 38–126)
Anion gap: 5 (ref 5–15)
BUN: 24 mg/dL — ABNORMAL HIGH (ref 8–23)
CO2: 24 mmol/L (ref 22–32)
Calcium: 8.7 mg/dL — ABNORMAL LOW (ref 8.9–10.3)
Chloride: 109 mmol/L (ref 98–111)
Creatinine: 1.37 mg/dL — ABNORMAL HIGH (ref 0.61–1.24)
GFR, Estimated: 54 mL/min — ABNORMAL LOW (ref >60)
Glucose, Bld: 267 mg/dL — ABNORMAL HIGH (ref 70–99)
Potassium: 4.8 mmol/L (ref 3.5–5.1)
Sodium: 138 mmol/L (ref 135–145)
Total Bilirubin: 0.5 mg/dL (ref 0.3–1.2)
Total Protein: 6.7 g/dL (ref 6.5–8.1)

## 2020-10-17 MED ORDER — SODIUM CHLORIDE 0.9 % IV SOLN
800.0000 mg/m2 | Freq: Once | INTRAVENOUS | Status: AC
  Administered 2020-10-17: 1596 mg via INTRAVENOUS
  Filled 2020-10-17: qty 41.98

## 2020-10-17 MED ORDER — SODIUM CHLORIDE 0.9 % IV SOLN
150.0000 mg | Freq: Once | INTRAVENOUS | Status: AC
  Administered 2020-10-17: 150 mg via INTRAVENOUS
  Filled 2020-10-17: qty 150

## 2020-10-17 MED ORDER — SODIUM CHLORIDE 0.9% FLUSH
10.0000 mL | INTRAVENOUS | Status: DC | PRN
  Administered 2020-10-17: 10 mL
  Filled 2020-10-17: qty 10

## 2020-10-17 MED ORDER — SODIUM CHLORIDE 0.9 % IV SOLN
Freq: Once | INTRAVENOUS | Status: AC
  Filled 2020-10-17: qty 250

## 2020-10-17 MED ORDER — PALONOSETRON HCL INJECTION 0.25 MG/5ML
0.2500 mg | Freq: Once | INTRAVENOUS | Status: AC
  Administered 2020-10-17: 0.25 mg via INTRAVENOUS

## 2020-10-17 MED ORDER — SODIUM CHLORIDE 0.9% FLUSH
10.0000 mL | Freq: Once | INTRAVENOUS | Status: AC
  Administered 2020-10-17: 10 mL
  Filled 2020-10-17: qty 10

## 2020-10-17 MED ORDER — SODIUM CHLORIDE 0.9 % IV SOLN
315.6000 mg | Freq: Once | INTRAVENOUS | Status: AC
Start: 2020-10-17 — End: 2020-10-17
  Administered 2020-10-17: 320 mg via INTRAVENOUS
  Filled 2020-10-17: qty 32

## 2020-10-17 MED ORDER — PALONOSETRON HCL INJECTION 0.25 MG/5ML
INTRAVENOUS | Status: AC
  Filled 2020-10-17: qty 5

## 2020-10-17 MED ORDER — HEPARIN SOD (PORK) LOCK FLUSH 100 UNIT/ML IV SOLN
500.0000 [IU] | Freq: Once | INTRAVENOUS | Status: AC | PRN
  Administered 2020-10-17: 500 [IU]
  Filled 2020-10-17: qty 5

## 2020-10-17 MED ORDER — DEXAMETHASONE SODIUM PHOSPHATE 100 MG/10ML IJ SOLN
10.0000 mg | Freq: Once | INTRAMUSCULAR | Status: AC
  Administered 2020-10-17: 10 mg via INTRAVENOUS
  Filled 2020-10-17: qty 10

## 2020-10-17 NOTE — Progress Notes (Signed)
Hematology and Oncology Follow Up Visit  Hector Lopez [id number] 1946-01-02 75 y.o. 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  Prior Therapy:  He underwent robotic assisted laparoscopic  nephroureterectomy completed on Feb 10, 2020.  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  He was found to have metastatic pericaval lymph node as well as a right common iliac lymph node.  Current therapy: Carboplatin and gemcitabine started on July 04, 2020. He is here for day 1 cycle 6 of therapy.   Interim History: Hector Lopez presents today for a follow-up visit. Since the last visit, he reports no major changes in his health.  He has tolerated chemotherapy without any complaints at this time.  Denies any nausea, vomiting or abdominal pain.  He denies any weight loss or appetite changes.  He denies any excessive fatigue or neuropathy.    Medications: Unchanged on review. Current Outpatient Medications  Medication Sig Dispense Refill  . amLODipine (NORVASC) 10 MG tablet Take 10 mg by mouth at bedtime.     . cetirizine (ZYRTEC) 10 MG tablet Take 10 mg by mouth daily.    . finasteride (PROSCAR) 5 MG tablet Take 5 mg by mouth daily.    Marland Kitchen glipiZIDE (GLUCOTROL) 10 MG tablet Take 10 mg by mouth daily before breakfast.     . lidocaine-prilocaine (EMLA) cream Apply 1 application topically as needed. 30 g 0  . lisinopril (ZESTRIL) 40 MG tablet Take 40 mg by mouth at bedtime.     . metFORMIN (GLUCOPHAGE) 1000 MG tablet Take 1,000 mg by mouth 2 (two) times daily with a meal.     . omeprazole (PRILOSEC) 40 MG capsule Take 40 mg by mouth daily.    Marland Kitchen oxyCODONE-acetaminophen (PERCOCET) 5-325 MG tablet Take 1 tablet by mouth every 6 (six) hours as  needed for moderate pain or severe pain. Post-operatively (Patient not taking: Reported on 06/20/2020) 15 tablet 0  . prochlorperazine (COMPAZINE) 10 MG tablet Take 1 tablet (10 mg total) by mouth every 6 (six) hours as needed for nausea or vomiting. 30 tablet 0  . senna-docusate (SENOKOT-S) 8.6-50 MG tablet Take 1 tablet by mouth 2 (two) times daily. While taking strong pain meds to prevent cosntipation. 10 tablet 0  . sertraline (ZOLOFT) 50 MG tablet Take 50 mg by mouth daily.    . simvastatin (ZOCOR) 20 MG tablet Take 20 mg by mouth daily.     Marland Kitchen sulfamethoxazole-trimethoprim (BACTRIM DS) 800-160 MG tablet Take 1 tablet by mouth daily. Start taking one day prior to your appointment for your first follow-up and catheter removal.  Continue taking for three days. (Patient not taking: Reported on 06/20/2020) 3 tablet 0   No current facility-administered medications for this visit.     Allergies:  Allergies  Allergen Reactions  . Codeine Nausea And Vomiting      Physical Exam:   Blood pressure 132/68, pulse 68, temperature 97.7 F (36.5 C), temperature source Tympanic, resp. rate 18, weight 184 lb 3.2 oz (83.6 kg), SpO2 100 %.    ECOG: 1    General appearance: Alert, awake without any distress. Head: Atraumatic without abnormalities Oropharynx: Without any thrush or ulcers. Eyes: No scleral icterus. Lymph nodes: No lymphadenopathy noted in the cervical, supraclavicular, or axillary nodes Heart:regular rate and rhythm,  without any murmurs or gallops.   Lung: Clear to auscultation without any rhonchi, wheezes or dullness to percussion. Abdomin: Soft, nontender without any shifting dullness or ascites. Musculoskeletal: No clubbing or cyanosis. Neurological: No motor or sensory deficits. Skin: No rashes or lesions.            Lab Results: Lab Results  Component Value Date   WBC 2.0 (L) 09/26/2020   HGB 10.0 (L) 09/26/2020   HCT 29.7 (L) 09/26/2020   MCV 90.5  09/26/2020   PLT 173 09/26/2020     Chemistry      Component Value Date/Time   NA 136 09/26/2020 1151   K 4.8 09/26/2020 1151   CL 107 09/26/2020 1151   CO2 24 09/26/2020 1151   BUN 21 09/26/2020 1151   CREATININE 1.32 (H) 09/26/2020 1151      Component Value Date/Time   CALCIUM 9.0 09/26/2020 1151   ALKPHOS 89 09/26/2020 1151   AST 17 09/26/2020 1151   ALT 12 09/26/2020 1151   BILITOT 0.6 09/26/2020 1151         Impression and Plan:  75 year old man with:  1.   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    The natural course of his disease was updated at this time and treatment options were reviewed. He has tolerated current palliative chemotherapy without any major complaints. Risks and benefits of proceeding with cycle 6 of chemotherapy were discussed. Complications include nausea, vomiting, myelosuppression, neutropenia possible sepsis were reiterated. Alternative options including immunotherapy and Padcev will be used if he has progression of disease in the future. Upon completing this cycle of therapy we will update his staging scans and consider further treatment accordingly.  Maintenance with immunotherapy versus active surveillance would be the options.  2. IV access: Port-A-Cath remains in use without any issues.  3. Antiemetics: Compazine is available to him without any nausea or vomiting.  4. Renal function surveillance:His kidney function remained stable on platinum therapy. We will continue to monitor the future.  5. Goals of care: Therapy is palliative although aggressive measures are warranted given his reasonable performance status.  6.  Neutropenia: Related to chemotherapy. He has received growth factor support after cycle 5 and will be added after cycle 6.  Absolute neutrophil count today is adequate to proceed with chemotherapy.  7. Follow-up: In 4 weeks for repeat evaluation after  imaging studies.  30  minutes were dedicated to this encounter. The time was spent on reviewing disease status, discussing laboratory data and future plan of care reviewed.    Zola Button, MD 2/2/20228:09 AM

## 2020-10-17 NOTE — Patient Instructions (Signed)
[facility] Discharge Instructions for Patients Receiving Chemotherapy  Today you received the following chemotherapy agents Gemzar and Carboplatin   To help prevent nausea and vomiting after your treatment, we encourage you to take your nausea medication as directed.    If you develop nausea and vomiting that is not controlled by your nausea medication, call the clinic.   BELOW ARE SYMPTOMS THAT SHOULD BE REPORTED IMMEDIATELY:  *FEVER GREATER THAN 100.5 F  *CHILLS WITH OR WITHOUT FEVER  NAUSEA AND VOMITING THAT IS NOT CONTROLLED WITH YOUR NAUSEA MEDICATION  *UNUSUAL SHORTNESS OF BREATH  *UNUSUAL BRUISING OR BLEEDING  TENDERNESS IN MOUTH AND THROAT WITH OR WITHOUT PRESENCE OF ULCERS  *URINARY PROBLEMS  *BOWEL PROBLEMS  UNUSUAL RASH Items with * indicate a potential emergency and should be followed up as soon as possible.  Feel free to call the clinic should you have any questions or concerns. The clinic phone number is [phone].  Please show the CHEMO ALERT CARD at check-in to the Emergency Department and triage nurse.   

## 2020-10-17 NOTE — Patient Instructions (Signed)

## 2020-10-19 ENCOUNTER — Inpatient Hospital Stay: Payer: Medicare Other

## 2020-10-19 ENCOUNTER — Other Ambulatory Visit: Payer: Self-pay

## 2020-10-19 VITALS — BP 149/74 | HR 68 | Temp 99.0°F | Resp 18

## 2020-10-19 DIAGNOSIS — D701 Agranulocytosis secondary to cancer chemotherapy: Secondary | ICD-10-CM | POA: Diagnosis not present

## 2020-10-19 DIAGNOSIS — C680 Malignant neoplasm of urethra: Secondary | ICD-10-CM | POA: Diagnosis not present

## 2020-10-19 DIAGNOSIS — Z5189 Encounter for other specified aftercare: Secondary | ICD-10-CM | POA: Diagnosis not present

## 2020-10-19 DIAGNOSIS — D4959 Neoplasm of unspecified behavior of other genitourinary organ: Secondary | ICD-10-CM

## 2020-10-19 DIAGNOSIS — T451X5A Adverse effect of antineoplastic and immunosuppressive drugs, initial encounter: Secondary | ICD-10-CM | POA: Diagnosis not present

## 2020-10-19 DIAGNOSIS — C772 Secondary and unspecified malignant neoplasm of intra-abdominal lymph nodes: Secondary | ICD-10-CM | POA: Diagnosis not present

## 2020-10-19 DIAGNOSIS — Z5111 Encounter for antineoplastic chemotherapy: Secondary | ICD-10-CM | POA: Diagnosis not present

## 2020-10-19 MED ORDER — PEGFILGRASTIM-JMDB 6 MG/0.6ML ~~LOC~~ SOSY
6.0000 mg | PREFILLED_SYRINGE | Freq: Once | SUBCUTANEOUS | Status: AC
  Administered 2020-10-19: 6 mg via SUBCUTANEOUS

## 2020-10-19 MED ORDER — PEGFILGRASTIM-JMDB 6 MG/0.6ML ~~LOC~~ SOSY
PREFILLED_SYRINGE | SUBCUTANEOUS | Status: AC
  Filled 2020-10-19: qty 0.6

## 2020-10-19 NOTE — Patient Instructions (Signed)
Pegfilgrastim injection What is this medicine? PEGFILGRASTIM (PEG fil gra stim) is a long-acting granulocyte colony-stimulating factor that stimulates the growth of neutrophils, a type of white blood cell important in the body's fight against infection. It is used to reduce the incidence of fever and infection in patients with certain types of cancer who are receiving chemotherapy that affects the bone marrow, and to increase survival after being exposed to high doses of radiation. This medicine may be used for other purposes; ask your health care provider or pharmacist if you have questions. COMMON BRAND NAME(S): Fulphila, Neulasta, Nyvepria, UDENYCA, Ziextenzo What should I tell my health care provider before I take this medicine? They need to know if you have any of these conditions:  kidney disease  latex allergy  ongoing radiation therapy  sickle cell disease  skin reactions to acrylic adhesives (On-Body Injector only)  an unusual or allergic reaction to pegfilgrastim, filgrastim, other medicines, foods, dyes, or preservatives  pregnant or trying to get pregnant  breast-feeding How should I use this medicine? This medicine is for injection under the skin. If you get this medicine at home, you will be taught how to prepare and give the pre-filled syringe or how to use the On-body Injector. Refer to the patient Instructions for Use for detailed instructions. Use exactly as directed. Tell your healthcare provider immediately if you suspect that the On-body Injector may not have performed as intended or if you suspect the use of the On-body Injector resulted in a missed or partial dose. It is important that you put your used needles and syringes in a special sharps container. Do not put them in a trash can. If you do not have a sharps container, call your pharmacist or healthcare provider to get one. Talk to your pediatrician regarding the use of this medicine in children. While this drug  may be prescribed for selected conditions, precautions do apply. Overdosage: If you think you have taken too much of this medicine contact a poison control center or emergency room at once. NOTE: This medicine is only for you. Do not share this medicine with others. What if I miss a dose? It is important not to miss your dose. Call your doctor or health care professional if you miss your dose. If you miss a dose due to an On-body Injector failure or leakage, a new dose should be administered as soon as possible using a single prefilled syringe for manual use. What may interact with this medicine? Interactions have not been studied. This list may not describe all possible interactions. Give your health care provider a list of all the medicines, herbs, non-prescription drugs, or dietary supplements you use. Also tell them if you smoke, drink alcohol, or use illegal drugs. Some items may interact with your medicine. What should I watch for while using this medicine? Your condition will be monitored carefully while you are receiving this medicine. You may need blood work done while you are taking this medicine. Talk to your health care provider about your risk of cancer. You may be more at risk for certain types of cancer if you take this medicine. If you are going to need a MRI, CT scan, or other procedure, tell your doctor that you are using this medicine (On-Body Injector only). What side effects may I notice from receiving this medicine? Side effects that you should report to your doctor or health care professional as soon as possible:  allergic reactions (skin rash, itching or hives, swelling of   the face, lips, or tongue)  back pain  dizziness  fever  pain, redness, or irritation at site where injected  pinpoint red spots on the skin  red or dark-brown urine  shortness of breath or breathing problems  stomach or side pain, or pain at the shoulder  swelling  tiredness  trouble  passing urine or change in the amount of urine  unusual bruising or bleeding Side effects that usually do not require medical attention (report to your doctor or health care professional if they continue or are bothersome):  bone pain  muscle pain This list may not describe all possible side effects. Call your doctor for medical advice about side effects. You may report side effects to FDA at 1-800-FDA-1088. Where should I keep my medicine? Keep out of the reach of children. If you are using this medicine at home, you will be instructed on how to store it. Throw away any unused medicine after the expiration date on the label. NOTE: This sheet is a summary. It may not cover all possible information. If you have questions about this medicine, talk to your doctor, pharmacist, or health care provider.  2021 Elsevier/Gold Standard (2019-09-23 13:20:51)  

## 2020-11-14 ENCOUNTER — Other Ambulatory Visit: Payer: Self-pay

## 2020-11-14 ENCOUNTER — Ambulatory Visit (HOSPITAL_COMMUNITY)
Admission: RE | Admit: 2020-11-14 | Discharge: 2020-11-14 | Disposition: A | Discharge status: Home / Self Care | Payer: Medicare Other | Source: Ambulatory Visit | Attending: Oncology | Admitting: Oncology

## 2020-11-14 DIAGNOSIS — D4959 Neoplasm of unspecified behavior of other genitourinary organ: Secondary | ICD-10-CM | POA: Diagnosis not present

## 2020-11-14 DIAGNOSIS — I700 Atherosclerosis of aorta: Secondary | ICD-10-CM | POA: Diagnosis not present

## 2020-11-14 DIAGNOSIS — Z85528 Personal history of other malignant neoplasm of kidney: Secondary | ICD-10-CM | POA: Diagnosis not present

## 2020-11-14 DIAGNOSIS — I2510 Atherosclerotic heart disease of native coronary artery without angina pectoris: Secondary | ICD-10-CM | POA: Diagnosis not present

## 2020-11-14 DIAGNOSIS — C679 Malignant neoplasm of bladder, unspecified: Secondary | ICD-10-CM | POA: Diagnosis not present

## 2020-11-14 DIAGNOSIS — K4090 Unilateral inguinal hernia, without obstruction or gangrene, not specified as recurrent: Secondary | ICD-10-CM | POA: Diagnosis not present

## 2020-11-14 DIAGNOSIS — K439 Ventral hernia without obstruction or gangrene: Secondary | ICD-10-CM | POA: Diagnosis not present

## 2020-11-14 IMAGING — CT CT CHEST-ABD-PELV W/ CM
2 of 5 series · 12 of 36 positions shown, 14 images · IV contrast (APPLIED)
Comparison: CT chest abdomen and pelvis [DATE]

CLINICAL DATA: Urothelial cancer right nephrectomy on chemotherapy.

EXAM:
CT CHEST, ABDOMEN, AND PELVIS WITH CONTRAST
TECHNIQUE: Multidetector CT imaging of the chest, abdomen and pelvis was
performed following the standard protocol during bolus
administration of intravenous contrast.
CONTRAST:  100mL OMNIPAQUE IOHEXOL 300 MG/ML  SOLN

[Series 2: cap with · axial · 0.86mm/px · z∈[+1138,+1698]mm · 9 of 142 slices shown, 11 images]
[im 15/142  mediastinal]
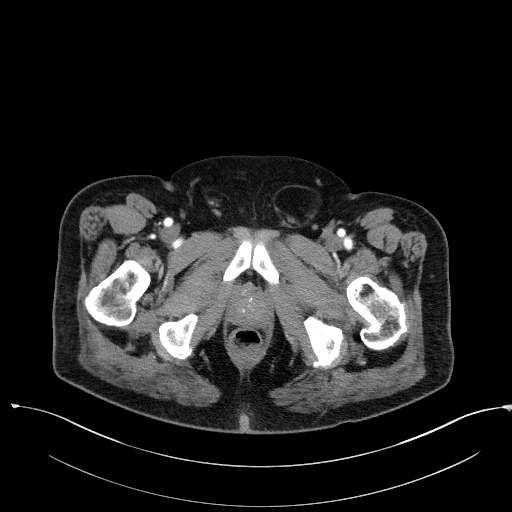
[im 15/142  bone]
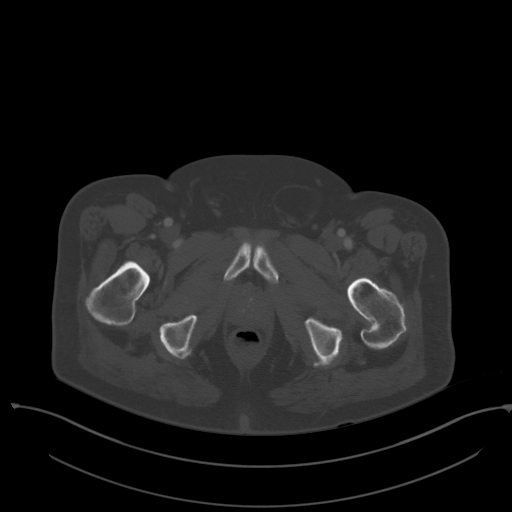
[im 29/142  mediastinal]
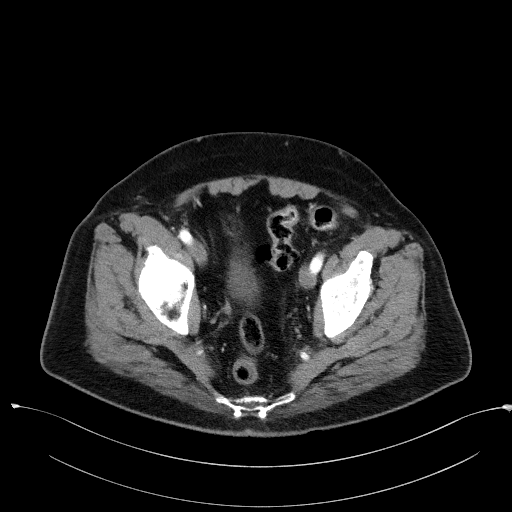
[im 43/142  mediastinal]
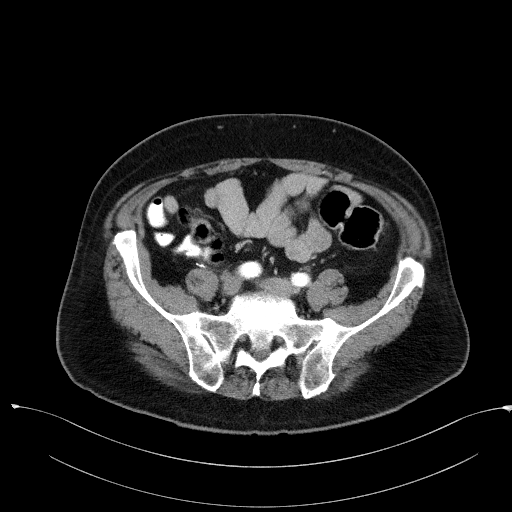
[im 57/142  mediastinal]
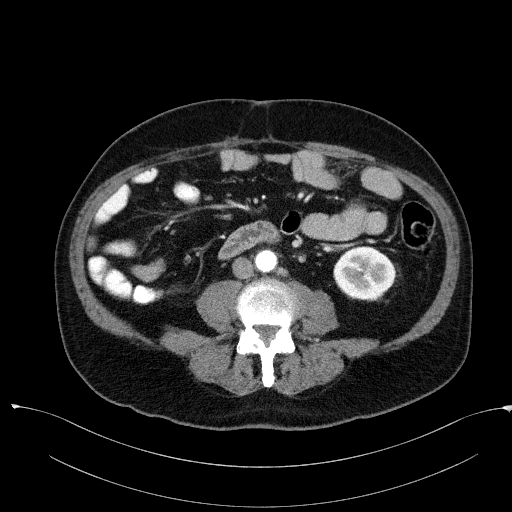
[im 71/142  mediastinal]
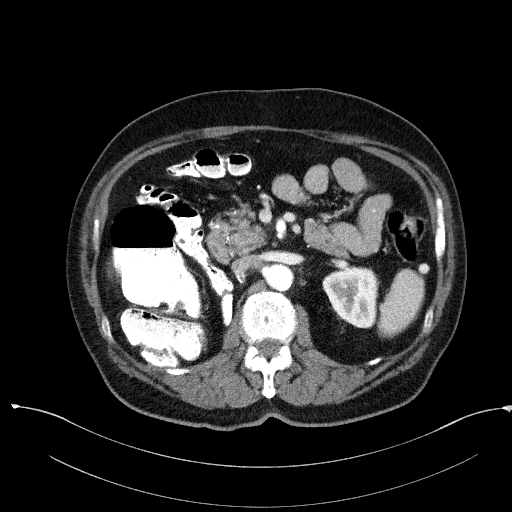
[im 85/142  mediastinal]
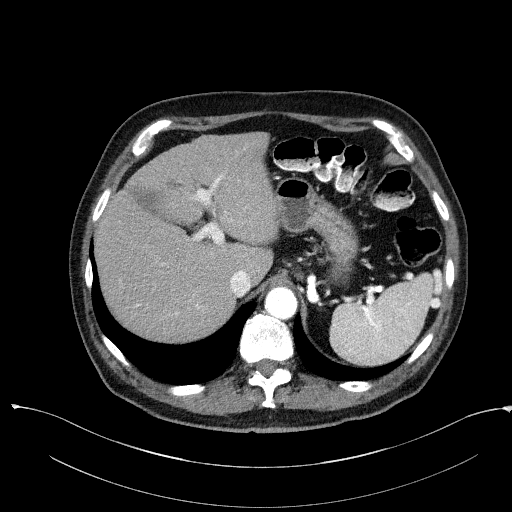
[im 99/142  mediastinal]
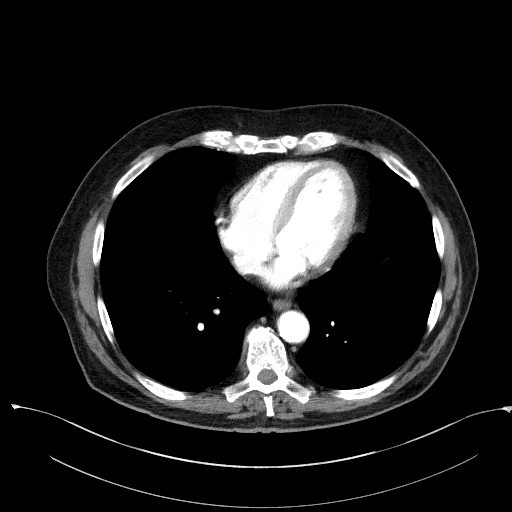
[im 113/142  mediastinal]
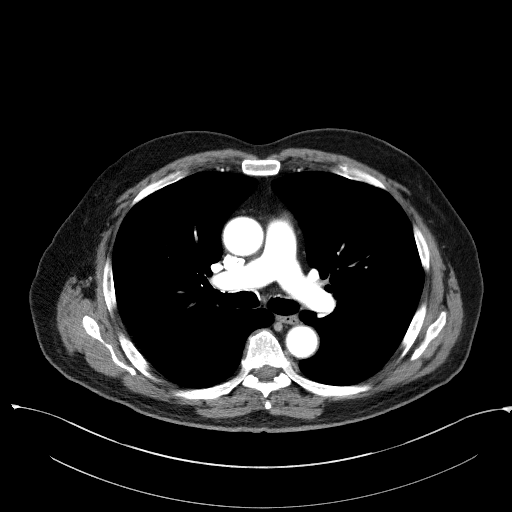
[im 127/142  mediastinal]
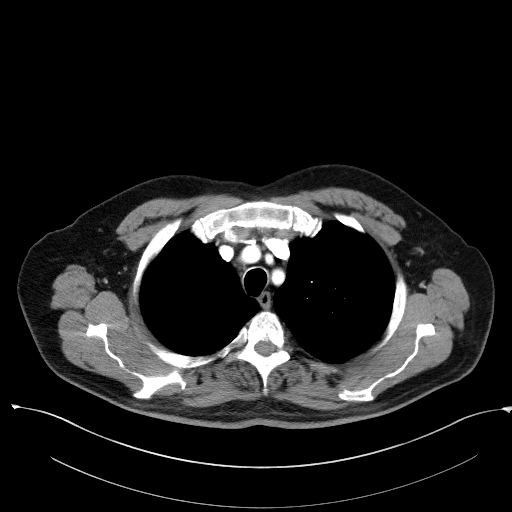
[im 127/142  bone]
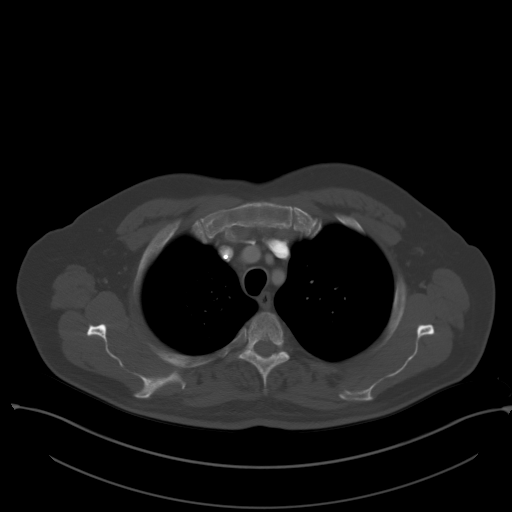

[Series 5: coronals · coronal · 0.78mm/px · 3 of 154 slices shown]
[im 31/154  mediastinal]
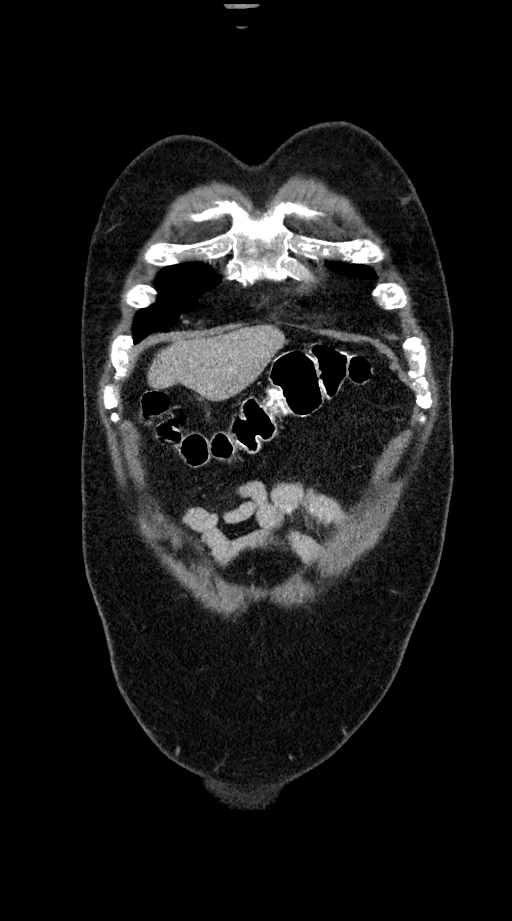
[im 62/154  mediastinal]
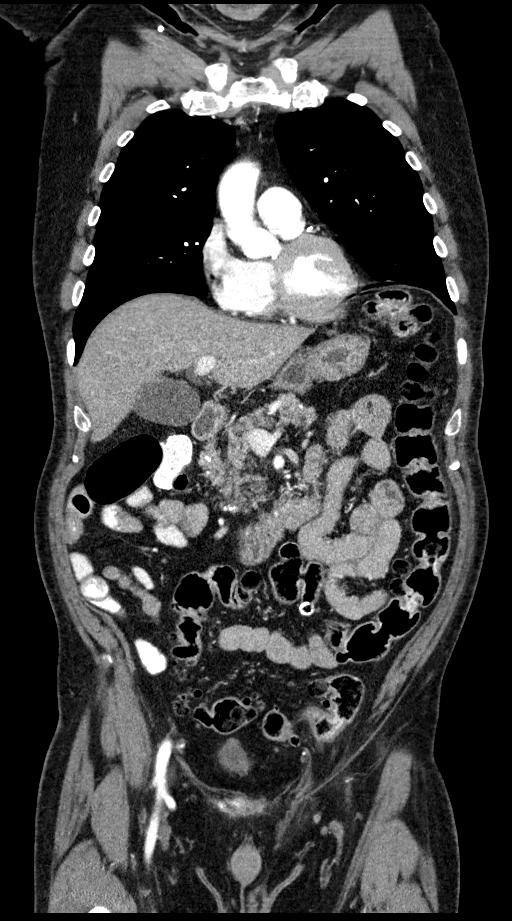
[im 92/154  mediastinal]
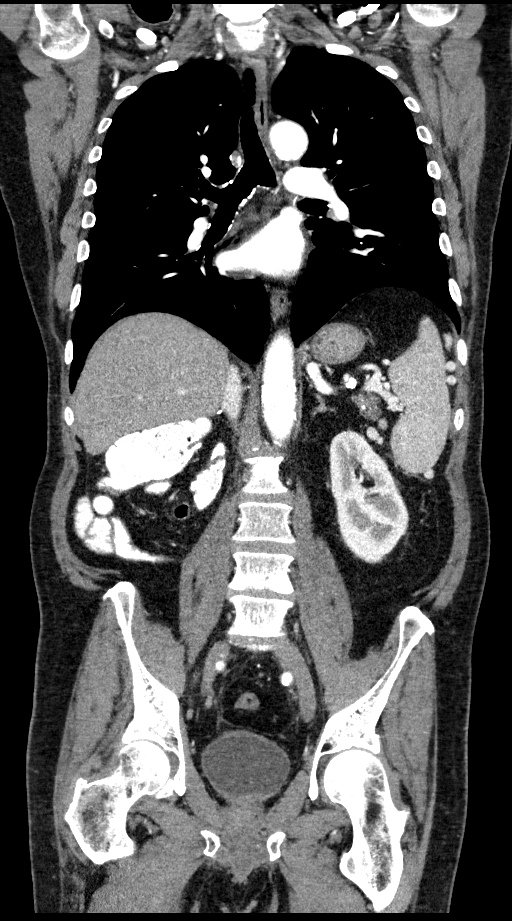

[12 of 36 positions shown; findings below may reference images not displayed]

FINDINGS: CT CHEST FINDINGS

Cardiovascular: Right chest porta catheter with tip in the right
atrium. Aortic atherosclerosis. No thoracic aortic aneurysm. No
central pulmonary embolus. Normal size heart. No pericardial
effusion. Coronary artery calcifications.

Mediastinum/Nodes: Tiny bilateral thyroid nodules are similar. Not
clinically significant; no follow-up imaging recommended (ref: [HOSPITAL]. [DATE]): 143-50).No supraclavicular adenopathy.
No mediastinal or hilar adenopathy. Similar size of the retrocrural
node which is upper normal size, similar at 6 mm on 60/2.

Lungs/Pleura: No pleural effusion. No pneumothorax. No suspicious
pulmonary nodules or masses.

Musculoskeletal: Unchanged appearance of the high density sclerotic
lesion in the anterior inferior aspect of the T11 vertebral body,
favored bone island. The mild T12 superior endplate irregularity is
unchanged. No acute osseous abnormality.

CT ABDOMEN PELVIS FINDINGS

Hepatobiliary: Diffuse hepatic steatosis. No suspicious hepatic
lesion. Multiple small gallstones without acute cholecystitis. No
biliary ductal dilation.

Pancreas: Similar changes of chronic pancreatitis. Tiny cystic foci
within the pancreatic tail and osteophytic change measuring up to 5
mm on image 65/2, likely pseudocysts. No peripancreatic edema.

Spleen: Unremarkable

Adrenals/Urinary Tract: Left adrenal gland is unremarkable. Prior
right nephrectomy without evidence of soft tissue nodularity or
suspicious enhancement in the nephrectomy bed. No suspicious renal
lesion. Urinary bladder is unremarkable for degree of distension. No
filling defect visualized within the opacified portion of the left
collecting system and ureter on delayed imaging. Tiny hypodense left
renal lesions too small to accurately characterize, but favored
represent cysts.

Stomach/Bowel: Stomach is unremarkable. No suspicious small bowel
wall thickening or dilation. Colonic diverticulosis without findings
of acute diverticulitis.

Vascular/Lymphatic: Aortic atherosclerosis. No abdominal aortic
aneurysm. Left upper quadrant portosystemic collaterals unchanged
likely related to remote splenic vein insufficiency.

Index lymph nodes are as follows:

-left periaortic lymph node measures 9 mm, unchanged on image 88/2

-preaortic lymph node measures 8 mm, unchanged on image 78/2.

Reproductive: Prostate is unremarkable.

Other: Nonobstructive small bowel containing YASURII type ventral
hernia. Fat containing left inguinal hernia.

Musculoskeletal: Multilevel degenerative changes spine. No
suspicious lytic or blastic lesion of bone.
IMPRESSION: 1. Status post right nephrectomy without evidence of recurrent
disease in the nephrectomy bed. Stable abdominopelvic lymph nodes
without new or progressive disease in the chest abdomen or pelvis.
2. Cholelithiasis without acute cholecystitis.
3. Nonobstructive small bowel containing YASURII type ventral
hernia.
4. Colonic diverticulosis without findings of acute diverticulitis.
5. Aortic atherosclerosis.  Aortic Atherosclerosis ([ML]-[ML]).

## 2020-11-14 MED ORDER — IOHEXOL 300 MG/ML  SOLN
100.0000 mL | Freq: Once | INTRAMUSCULAR | Status: AC | PRN
  Administered 2020-11-14: 100 mL via INTRAVENOUS

## 2020-11-20 ENCOUNTER — Other Ambulatory Visit: Payer: Self-pay

## 2020-11-20 ENCOUNTER — Inpatient Hospital Stay: Payer: Medicare Other

## 2020-11-20 ENCOUNTER — Inpatient Hospital Stay: Payer: Medicare Other | Attending: Oncology | Admitting: Oncology

## 2020-11-20 ENCOUNTER — Inpatient Hospital Stay: Payer: Medicare Other | Admitting: Oncology

## 2020-11-20 VITALS — BP 149/81 | HR 72 | Temp 96.8°F | Resp 18 | Ht 70.0 in | Wt 187.4 lb

## 2020-11-20 DIAGNOSIS — Z905 Acquired absence of kidney: Secondary | ICD-10-CM | POA: Diagnosis not present

## 2020-11-20 DIAGNOSIS — Z9221 Personal history of antineoplastic chemotherapy: Secondary | ICD-10-CM | POA: Diagnosis not present

## 2020-11-20 DIAGNOSIS — C680 Malignant neoplasm of urethra: Secondary | ICD-10-CM | POA: Insufficient documentation

## 2020-11-20 DIAGNOSIS — C772 Secondary and unspecified malignant neoplasm of intra-abdominal lymph nodes: Secondary | ICD-10-CM | POA: Diagnosis not present

## 2020-11-20 DIAGNOSIS — Z79899 Other long term (current) drug therapy: Secondary | ICD-10-CM | POA: Insufficient documentation

## 2020-11-20 DIAGNOSIS — D4959 Neoplasm of unspecified behavior of other genitourinary organ: Secondary | ICD-10-CM

## 2020-11-20 DIAGNOSIS — Z7984 Long term (current) use of oral hypoglycemic drugs: Secondary | ICD-10-CM | POA: Insufficient documentation

## 2020-11-20 NOTE — Progress Notes (Signed)
Hematology and Oncology Follow Up Visit  Hector Lopez [id number] 03/19/1946 75 y.o. 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  He subsequently developed pelvic adenopathy suspicious for stage IV malignancy in September 2021.  Prior Therapy:  He underwent robotic assisted laparoscopic  nephroureterectomy completed on Feb 10, 2020.  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  He was found to have metastatic pericaval lymph node as well as a right common iliac lymph node.  Carboplatin and gemcitabine started on July 04, 2020.  He completed 6 cycles of therapy on October 17, 2020.  Current therapy: Active surveillance.   Interim History: Hector Lopez is here for repeat evaluation.  Since the last visit, he reports no major changes in his health.  He denies any delayed complications related to chemotherapy.  Denies any nausea, vomiting or abdominal pain.  Denies any recent hospitalizations or illnesses.    Medications: Updated on review. Current Outpatient Medications  Medication Sig Dispense Refill  . amLODipine (NORVASC) 10 MG tablet Take 10 mg by mouth at bedtime.     . cetirizine (ZYRTEC) 10 MG tablet Take 10 mg by mouth daily.    . finasteride (PROSCAR) 5 MG tablet Take 5 mg by mouth daily.    Marland Kitchen glipiZIDE (GLUCOTROL) 10 MG tablet Take 10 mg by mouth daily before breakfast.     . lidocaine-prilocaine (EMLA) cream Apply 1 application topically as needed. 30 g 0  . lisinopril (ZESTRIL) 40 MG tablet Take 40 mg by mouth at bedtime.     . metFORMIN (GLUCOPHAGE) 1000 MG tablet Take 1,000 mg by mouth 2 (two) times daily with a meal.     . omeprazole (PRILOSEC) 40 MG capsule Take 40 mg by mouth daily.    Marland Kitchen oxyCODONE-acetaminophen (PERCOCET) 5-325 MG tablet Take 1 tablet by  mouth every 6 (six) hours as needed for moderate pain or severe pain. Post-operatively (Patient not taking: Reported on 06/20/2020) 15 tablet 0  . prochlorperazine (COMPAZINE) 10 MG tablet Take 1 tablet (10 mg total) by mouth every 6 (six) hours as needed for nausea or vomiting. 30 tablet 0  . senna-docusate (SENOKOT-S) 8.6-50 MG tablet Take 1 tablet by mouth 2 (two) times daily. While taking strong pain meds to prevent cosntipation. 10 tablet 0  . sertraline (ZOLOFT) 50 MG tablet Take 50 mg by mouth daily.    . simvastatin (ZOCOR) 20 MG tablet Take 20 mg by mouth daily.     Marland Kitchen sulfamethoxazole-trimethoprim (BACTRIM DS) 800-160 MG tablet Take 1 tablet by mouth daily. Start taking one day prior to your appointment for your first follow-up and catheter removal.  Continue taking for three days. (Patient not taking: Reported on 06/20/2020) 3 tablet 0   No current facility-administered medications for this visit.     Allergies:  Allergies  Allergen Reactions  . Codeine Nausea And Vomiting      Physical Exam:   Blood pressure (!) 149/81, pulse 72, temperature (!) 96.8 F (36 C), temperature source Tympanic, resp. rate 18, height 5\' 10"  (1.778 m), weight 187 lb 6.4 oz (85 kg), SpO2 99 %.     ECOG: 1   General appearance: Comfortable appearing without any discomfort Head: Normocephalic without any trauma Oropharynx: Mucous membranes are moist and pink without any thrush or ulcers. Eyes: Pupils are equal and round  reactive to light. Lymph nodes: No cervical, supraclavicular, inguinal or axillary lymphadenopathy.   Heart:regular rate and rhythm.  S1 and S2 without leg edema. Lung: Clear without any rhonchi or wheezes.  No dullness to percussion. Abdomin: Soft, nontender, nondistended with good bowel sounds.  No hepatosplenomegaly. Musculoskeletal: No joint deformity or effusion.  Full range of motion noted. Neurological: No deficits noted on motor, sensory and deep tendon reflex  exam. Skin: No petechial rash or dryness.  Appeared moist.             Lab Results: Lab Results  Component Value Date   WBC 4.9 10/17/2020   HGB 9.8 (L) 10/17/2020   HCT 30.0 (L) 10/17/2020   MCV 93.5 10/17/2020   PLT 247 10/17/2020     Chemistry      Component Value Date/Time   NA 138 10/17/2020 0820   K 4.8 10/17/2020 0820   CL 109 10/17/2020 0820   CO2 24 10/17/2020 0820   BUN 24 (H) 10/17/2020 0820   CREATININE 1.37 (H) 10/17/2020 0820      Component Value Date/Time   CALCIUM 8.7 (L) 10/17/2020 0820   ALKPHOS 112 10/17/2020 0820   AST 14 (L) 10/17/2020 0820   ALT 12 10/17/2020 0820   BILITOT 0.5 10/17/2020 0820     IMPRESSION: 1. Status post right nephrectomy without evidence of recurrent disease in the nephrectomy bed. Stable abdominopelvic lymph nodes without new or progressive disease in the chest abdomen or pelvis. 2. Cholelithiasis without acute cholecystitis. 3. Nonobstructive small bowel containing Richter type ventral hernia. 4. Colonic diverticulosis without findings of acute diverticulitis. 5. Aortic atherosclerosis.  Aortic Atherosclerosis (ICD10-I70.0).     Impression and Plan:  75 year old man with:  1.   High-grade urothelial carcinoma arising from the right renal pelvis and the right ureter diagnosed in May 2021.   He developed a stage IV disease in September 2021.  He completed this 6 cycles of carboplatin and gemcitabine without any major complications.  He had a a near complete response to therapy as evident by CT scan obtained on November 14, 2020.  This was personally reviewed and discussed with him today and treatment options were discussed.  Observation versus a switch maintenance immunotherapy were reviewed at this time.  Based on the JAVELIN bladder 100, Avelumab maintenance has been beneficial with improved overall survival.  Risks and benefits of immunotherapy in general were discussed today.  Complications include immune mediated  issues such as pneumonitis, colitis and thyroid disease versus GI toxicity as well as dermatitis were reiterated.    After discussion today, he opted for short period of active surveillance and repeat imaging studies in 3 months.  Like care provided to healed normally for consideration of any additional therapy.  2. IV access: Port-A-Cath currently in use without any issues.  This will be flushed and 6 weeks and in 3 months.  3. Antiemetics: No nausea or vomiting reported.  Compazine is available to him.  4. Renal function surveillance:Kidney function remained stable after platinum therapy.  5. Goals of care: His disease is likely incurable although aggressive measures are warranted at this time..  6.  Neutropenia: Resolved after reviewed conclusion of chemotherapy with growth factor support.  7. Follow-up: In 6 weeks for Port-A-Cath flush and in 3 months for MD follow-up.  30  minutes were spent on this visit.  The time was dedicated to reviewing disease status, discussing treatment options and future plan of care review.    Zola Button, MD  3/8/202210:00 AM

## 2020-12-04 DIAGNOSIS — R338 Other retention of urine: Secondary | ICD-10-CM | POA: Diagnosis not present

## 2020-12-04 DIAGNOSIS — Z905 Acquired absence of kidney: Secondary | ICD-10-CM | POA: Diagnosis not present

## 2020-12-04 DIAGNOSIS — C651 Malignant neoplasm of right renal pelvis: Secondary | ICD-10-CM | POA: Diagnosis not present

## 2020-12-04 DIAGNOSIS — C778 Secondary and unspecified malignant neoplasm of lymph nodes of multiple regions: Secondary | ICD-10-CM | POA: Diagnosis not present

## 2020-12-14 DIAGNOSIS — H25812 Combined forms of age-related cataract, left eye: Secondary | ICD-10-CM | POA: Diagnosis not present

## 2020-12-14 DIAGNOSIS — H401121 Primary open-angle glaucoma, left eye, mild stage: Secondary | ICD-10-CM | POA: Diagnosis not present

## 2021-01-01 ENCOUNTER — Inpatient Hospital Stay: Payer: Medicare Other | Attending: Oncology

## 2021-01-01 ENCOUNTER — Other Ambulatory Visit: Payer: Self-pay

## 2021-01-01 DIAGNOSIS — C680 Malignant neoplasm of urethra: Secondary | ICD-10-CM | POA: Insufficient documentation

## 2021-01-01 DIAGNOSIS — Z452 Encounter for adjustment and management of vascular access device: Secondary | ICD-10-CM | POA: Diagnosis not present

## 2021-01-01 DIAGNOSIS — C772 Secondary and unspecified malignant neoplasm of intra-abdominal lymph nodes: Secondary | ICD-10-CM | POA: Insufficient documentation

## 2021-02-13 DIAGNOSIS — E1169 Type 2 diabetes mellitus with other specified complication: Secondary | ICD-10-CM | POA: Diagnosis not present

## 2021-02-13 DIAGNOSIS — I10 Essential (primary) hypertension: Secondary | ICD-10-CM | POA: Diagnosis not present

## 2021-02-13 DIAGNOSIS — E7849 Other hyperlipidemia: Secondary | ICD-10-CM | POA: Diagnosis not present

## 2021-02-13 DIAGNOSIS — E782 Mixed hyperlipidemia: Secondary | ICD-10-CM | POA: Diagnosis not present

## 2021-02-19 ENCOUNTER — Inpatient Hospital Stay: Payer: Medicare Other

## 2021-02-19 ENCOUNTER — Other Ambulatory Visit: Payer: Self-pay

## 2021-02-19 ENCOUNTER — Inpatient Hospital Stay: Payer: Medicare Other | Attending: Oncology

## 2021-02-19 ENCOUNTER — Other Ambulatory Visit: Payer: Medicare Other

## 2021-02-19 DIAGNOSIS — Z79899 Other long term (current) drug therapy: Secondary | ICD-10-CM | POA: Insufficient documentation

## 2021-02-19 DIAGNOSIS — D509 Iron deficiency anemia, unspecified: Secondary | ICD-10-CM | POA: Diagnosis not present

## 2021-02-19 DIAGNOSIS — Z452 Encounter for adjustment and management of vascular access device: Secondary | ICD-10-CM | POA: Insufficient documentation

## 2021-02-19 DIAGNOSIS — C7910 Secondary malignant neoplasm of unspecified urinary organs: Secondary | ICD-10-CM | POA: Diagnosis not present

## 2021-02-19 DIAGNOSIS — C772 Secondary and unspecified malignant neoplasm of intra-abdominal lymph nodes: Secondary | ICD-10-CM | POA: Diagnosis not present

## 2021-02-19 DIAGNOSIS — E1169 Type 2 diabetes mellitus with other specified complication: Secondary | ICD-10-CM | POA: Diagnosis not present

## 2021-02-19 DIAGNOSIS — C661 Malignant neoplasm of right ureter: Secondary | ICD-10-CM | POA: Diagnosis not present

## 2021-02-19 DIAGNOSIS — Z9221 Personal history of antineoplastic chemotherapy: Secondary | ICD-10-CM | POA: Insufficient documentation

## 2021-02-19 DIAGNOSIS — Z6826 Body mass index (BMI) 26.0-26.9, adult: Secondary | ICD-10-CM | POA: Diagnosis not present

## 2021-02-19 DIAGNOSIS — R944 Abnormal results of kidney function studies: Secondary | ICD-10-CM | POA: Diagnosis not present

## 2021-02-19 DIAGNOSIS — Z95828 Presence of other vascular implants and grafts: Secondary | ICD-10-CM

## 2021-02-19 DIAGNOSIS — D4959 Neoplasm of unspecified behavior of other genitourinary organ: Secondary | ICD-10-CM

## 2021-02-19 DIAGNOSIS — C680 Malignant neoplasm of urethra: Secondary | ICD-10-CM | POA: Insufficient documentation

## 2021-02-19 DIAGNOSIS — E7849 Other hyperlipidemia: Secondary | ICD-10-CM | POA: Diagnosis not present

## 2021-02-19 DIAGNOSIS — I10 Essential (primary) hypertension: Secondary | ICD-10-CM | POA: Diagnosis not present

## 2021-02-19 DIAGNOSIS — Z7984 Long term (current) use of oral hypoglycemic drugs: Secondary | ICD-10-CM | POA: Diagnosis not present

## 2021-02-19 LAB — CBC WITH DIFFERENTIAL (CANCER CENTER ONLY)
Abs Immature Granulocytes: 0.01 10*3/uL (ref 0.00–0.07)
Basophils Absolute: 0 10*3/uL (ref 0.0–0.1)
Basophils Relative: 1 %
Eosinophils Absolute: 0.2 10*3/uL (ref 0.0–0.5)
Eosinophils Relative: 6 %
HCT: 34.9 % — ABNORMAL LOW (ref 39.0–52.0)
Hemoglobin: 11.5 g/dL — ABNORMAL LOW (ref 13.0–17.0)
Immature Granulocytes: 0 %
Lymphocytes Relative: 28 %
Lymphs Abs: 1.1 10*3/uL (ref 0.7–4.0)
MCH: 28.3 pg (ref 26.0–34.0)
MCHC: 33 g/dL (ref 30.0–36.0)
MCV: 85.7 fL (ref 80.0–100.0)
Monocytes Absolute: 0.3 10*3/uL (ref 0.1–1.0)
Monocytes Relative: 7 %
Neutro Abs: 2.2 10*3/uL (ref 1.7–7.7)
Neutrophils Relative %: 58 %
Platelet Count: 118 10*3/uL — ABNORMAL LOW (ref 150–400)
RBC: 4.07 MIL/uL — ABNORMAL LOW (ref 4.22–5.81)
RDW: 14.5 % (ref 11.5–15.5)
WBC Count: 3.8 10*3/uL — ABNORMAL LOW (ref 4.0–10.5)
nRBC: 0 % (ref 0.0–0.2)

## 2021-02-19 LAB — CMP (CANCER CENTER ONLY)
ALT: 9 U/L (ref 0–44)
AST: 15 U/L (ref 15–41)
Albumin: 3.9 g/dL (ref 3.5–5.0)
Alkaline Phosphatase: 72 U/L (ref 38–126)
Anion gap: 8 (ref 5–15)
BUN: 17 mg/dL (ref 8–23)
CO2: 23 mmol/L (ref 22–32)
Calcium: 9.4 mg/dL (ref 8.9–10.3)
Chloride: 108 mmol/L (ref 98–111)
Creatinine: 1.4 mg/dL — ABNORMAL HIGH (ref 0.61–1.24)
GFR, Estimated: 52 mL/min — ABNORMAL LOW (ref >60)
Glucose, Bld: 113 mg/dL — ABNORMAL HIGH (ref 70–99)
Potassium: 5.2 mmol/L — ABNORMAL HIGH (ref 3.5–5.1)
Sodium: 139 mmol/L (ref 135–145)
Total Bilirubin: 0.5 mg/dL (ref 0.3–1.2)
Total Protein: 7.1 g/dL (ref 6.5–8.1)

## 2021-02-19 MED ORDER — HEPARIN SOD (PORK) LOCK FLUSH 100 UNIT/ML IV SOLN
500.0000 [IU] | Freq: Once | INTRAVENOUS | Status: AC
  Administered 2021-02-19: 500 [IU]
  Filled 2021-02-19: qty 5

## 2021-02-19 MED ORDER — SODIUM CHLORIDE 0.9% FLUSH
10.0000 mL | Freq: Once | INTRAVENOUS | Status: AC
  Administered 2021-02-19: 10 mL
  Filled 2021-02-19: qty 10

## 2021-02-19 NOTE — Progress Notes (Signed)
Pt getting labs drawn peripherally today due to no blood return but port flushed. He also has no other appts today.

## 2021-02-26 ENCOUNTER — Other Ambulatory Visit: Payer: Self-pay

## 2021-02-26 ENCOUNTER — Inpatient Hospital Stay (HOSPITAL_BASED_OUTPATIENT_CLINIC_OR_DEPARTMENT_OTHER): Payer: Medicare Other | Admitting: Oncology

## 2021-02-26 VITALS — BP 148/78 | HR 69 | Temp 97.9°F | Resp 17 | Wt 186.4 lb

## 2021-02-26 DIAGNOSIS — Z452 Encounter for adjustment and management of vascular access device: Secondary | ICD-10-CM | POA: Diagnosis not present

## 2021-02-26 DIAGNOSIS — D4959 Neoplasm of unspecified behavior of other genitourinary organ: Secondary | ICD-10-CM

## 2021-02-26 DIAGNOSIS — C680 Malignant neoplasm of urethra: Secondary | ICD-10-CM | POA: Diagnosis not present

## 2021-02-26 DIAGNOSIS — C772 Secondary and unspecified malignant neoplasm of intra-abdominal lymph nodes: Secondary | ICD-10-CM | POA: Diagnosis not present

## 2021-02-26 DIAGNOSIS — Z7984 Long term (current) use of oral hypoglycemic drugs: Secondary | ICD-10-CM | POA: Diagnosis not present

## 2021-02-26 DIAGNOSIS — Z9221 Personal history of antineoplastic chemotherapy: Secondary | ICD-10-CM | POA: Diagnosis not present

## 2021-02-26 DIAGNOSIS — Z79899 Other long term (current) drug therapy: Secondary | ICD-10-CM | POA: Diagnosis not present

## 2021-02-26 NOTE — Progress Notes (Signed)
Hematology and Oncology Follow Up Visit  Hector Lopez [id number] 1946/08/06 75 y.o. 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  Prior Therapy:  He underwent robotic assisted laparoscopic  nephroureterectomy completed on Feb 10, 2020.  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  He was found to have metastatic pericaval lymph node as well as a right common iliac lymph node.  Carboplatin and gemcitabine started on July 04, 2020.  He completed 6 cycles of therapy on October 17, 2020.  Current therapy: Active surveillance.   Interim History: Hector Lopez is here for return evaluation.  Since her last visit, he reports feeling well without any complaints.  He denies any recent hospitalizations or illnesses.  He denies any pelvic discomfort or hematuria.  His performance status quality of life remain excellent.    Medications: Unchanged on review. Current Outpatient Medications  Medication Sig Dispense Refill   amLODipine (NORVASC) 10 MG tablet Take 10 mg by mouth at bedtime.      cetirizine (ZYRTEC) 10 MG tablet Take 10 mg by mouth daily.     finasteride (PROSCAR) 5 MG tablet Take 5 mg by mouth daily.     glipiZIDE (GLUCOTROL) 10 MG tablet Take 10 mg by mouth daily before breakfast.      lidocaine-prilocaine (EMLA) cream Apply 1 application topically as needed. 30 g 0   lisinopril (ZESTRIL) 40 MG tablet Take 40 mg by mouth at bedtime.      metFORMIN (GLUCOPHAGE) 1000 MG tablet Take 1,000 mg by mouth 2 (two) times daily with a meal.      omeprazole (PRILOSEC) 40 MG capsule Take 40 mg by mouth daily.     oxyCODONE-acetaminophen (PERCOCET) 5-325 MG tablet Take 1 tablet by mouth every 6 (six) hours as needed for moderate pain or severe  pain. Post-operatively (Patient not taking: Reported on 06/20/2020) 15 tablet 0   prochlorperazine (COMPAZINE) 10 MG tablet Take 1 tablet (10 mg total) by mouth every 6 (six) hours as needed for nausea or vomiting. 30 tablet 0   senna-docusate (SENOKOT-S) 8.6-50 MG tablet Take 1 tablet by mouth 2 (two) times daily. While taking strong pain meds to prevent cosntipation. 10 tablet 0   sertraline (ZOLOFT) 50 MG tablet Take 50 mg by mouth daily.     simvastatin (ZOCOR) 20 MG tablet Take 20 mg by mouth daily.      sulfamethoxazole-trimethoprim (BACTRIM DS) 800-160 MG tablet Take 1 tablet by mouth daily. Start taking one day prior to your appointment for your first follow-up and catheter removal.  Continue taking for three days. (Patient not taking: Reported on 06/20/2020) 3 tablet 0   No current facility-administered medications for this visit.     Allergies:  Allergies  Allergen Reactions   Codeine Nausea And Vomiting      Physical Exam:        ECOG: 1    General appearance: Alert, awake without any distress. Head: Atraumatic without abnormalities Oropharynx: Without any thrush or ulcers. Eyes: No scleral icterus. Lymph nodes: No lymphadenopathy noted in the cervical, supraclavicular, or axillary nodes Heart:regular rate and rhythm, without any murmurs or gallops.   Lung: Clear to auscultation without any rhonchi, wheezes or dullness to percussion. Abdomin: Soft, nontender without any shifting dullness or ascites. Musculoskeletal: No clubbing or cyanosis.  Neurological: No motor or sensory deficits. Skin: No rashes or lesions.             Lab Results: Lab Results  Component Value Date   WBC 3.8 (L) 02/19/2021   HGB 11.5 (L) 02/19/2021   HCT 34.9 (L) 02/19/2021   MCV 85.7 02/19/2021   PLT 118 (L) 02/19/2021     Chemistry      Component Value Date/Time   NA 139 02/19/2021 1030   K 5.2 (H) 02/19/2021 1030   CL 108 02/19/2021 1030   CO2 23 02/19/2021 1030    BUN 17 02/19/2021 1030   CREATININE 1.40 (H) 02/19/2021 1030      Component Value Date/Time   CALCIUM 9.4 02/19/2021 1030   ALKPHOS 72 02/19/2021 1030   AST 15 02/19/2021 1030   ALT 9 02/19/2021 1030   BILITOT 0.5 02/19/2021 1030           Impression and Plan:  75 year old man with:   1.   Stage IV high-grade urothelial carcinoma arising from the right renal pelvis with pelvic adenopathy noted in September 2021.   His disease status was updated at this time and treatment choices were reviewed.  Active surveillance versus switch maintenance with immunotherapy were reiterated at this time.  He opted against additional therapy unless his disease is documented with relapse.  We will repeat imaging studies in 3 months.  If he develops metastatic disease, immunotherapy versus antibiotic drug conjugate versus oral targeted therapy or repeat considered.  2.  IV access: Port-A-Cath remains in place and will be flushed in 6 weeks and in 3 months.  3.  Renal function surveillance: His kidney function remains at baseline with a creatinine clearance of 50 cc/min.    4.  Follow-up: He will return in 3 months for repeat imaging studies.   30  minutes were dedicated to this encounter.  The time was spent on reviewing his disease status, treatment options and future plan of care review.      Zola Button, MD 6/14/20229:27 AM

## 2021-03-26 ENCOUNTER — Encounter: Payer: Self-pay | Admitting: Oncology

## 2021-04-11 ENCOUNTER — Inpatient Hospital Stay: Payer: Medicare Other

## 2021-04-11 ENCOUNTER — Inpatient Hospital Stay: Payer: Medicare Other | Attending: Oncology

## 2021-04-11 ENCOUNTER — Other Ambulatory Visit: Payer: Self-pay

## 2021-04-11 DIAGNOSIS — D4959 Neoplasm of unspecified behavior of other genitourinary organ: Secondary | ICD-10-CM

## 2021-04-11 DIAGNOSIS — C772 Secondary and unspecified malignant neoplasm of intra-abdominal lymph nodes: Secondary | ICD-10-CM | POA: Insufficient documentation

## 2021-04-11 DIAGNOSIS — C680 Malignant neoplasm of urethra: Secondary | ICD-10-CM | POA: Diagnosis not present

## 2021-04-11 LAB — CBC WITH DIFFERENTIAL (CANCER CENTER ONLY)
Abs Immature Granulocytes: 0.01 10*3/uL (ref 0.00–0.07)
Basophils Absolute: 0 10*3/uL (ref 0.0–0.1)
Basophils Relative: 1 %
Eosinophils Absolute: 0.2 10*3/uL (ref 0.0–0.5)
Eosinophils Relative: 5 %
HCT: 35.9 % — ABNORMAL LOW (ref 39.0–52.0)
Hemoglobin: 12.1 g/dL — ABNORMAL LOW (ref 13.0–17.0)
Immature Granulocytes: 0 %
Lymphocytes Relative: 27 %
Lymphs Abs: 1.1 10*3/uL (ref 0.7–4.0)
MCH: 29.5 pg (ref 26.0–34.0)
MCHC: 33.7 g/dL (ref 30.0–36.0)
MCV: 87.6 fL (ref 80.0–100.0)
Monocytes Absolute: 0.3 10*3/uL (ref 0.1–1.0)
Monocytes Relative: 7 %
Neutro Abs: 2.5 10*3/uL (ref 1.7–7.7)
Neutrophils Relative %: 60 %
Platelet Count: 136 10*3/uL — ABNORMAL LOW (ref 150–400)
RBC: 4.1 MIL/uL — ABNORMAL LOW (ref 4.22–5.81)
RDW: 14.6 % (ref 11.5–15.5)
WBC Count: 4.1 10*3/uL (ref 4.0–10.5)
nRBC: 0 % (ref 0.0–0.2)

## 2021-04-11 LAB — CMP (CANCER CENTER ONLY)
ALT: 8 U/L (ref 0–44)
AST: 13 U/L — ABNORMAL LOW (ref 15–41)
Albumin: 3.8 g/dL (ref 3.5–5.0)
Alkaline Phosphatase: 66 U/L (ref 38–126)
Anion gap: 6 (ref 5–15)
BUN: 16 mg/dL (ref 8–23)
CO2: 26 mmol/L (ref 22–32)
Calcium: 9.5 mg/dL (ref 8.9–10.3)
Chloride: 108 mmol/L (ref 98–111)
Creatinine: 1.38 mg/dL — ABNORMAL HIGH (ref 0.61–1.24)
GFR, Estimated: 53 mL/min — ABNORMAL LOW (ref >60)
Glucose, Bld: 142 mg/dL — ABNORMAL HIGH (ref 70–99)
Potassium: 5 mmol/L (ref 3.5–5.1)
Sodium: 140 mmol/L (ref 135–145)
Total Bilirubin: 0.6 mg/dL (ref 0.3–1.2)
Total Protein: 7.1 g/dL (ref 6.5–8.1)

## 2021-04-16 ENCOUNTER — Other Ambulatory Visit: Payer: Medicare Other

## 2021-04-24 ENCOUNTER — Encounter: Payer: Self-pay | Admitting: Oncology

## 2021-05-21 ENCOUNTER — Ambulatory Visit (HOSPITAL_COMMUNITY)
Admission: RE | Admit: 2021-05-21 | Discharge: 2021-05-21 | Disposition: A | Discharge status: Home / Self Care | Payer: Medicare Other | Source: Ambulatory Visit | Attending: Oncology | Admitting: Oncology

## 2021-05-21 ENCOUNTER — Inpatient Hospital Stay: Payer: Medicare Other | Attending: Oncology

## 2021-05-21 ENCOUNTER — Other Ambulatory Visit: Payer: Self-pay

## 2021-05-21 DIAGNOSIS — C641 Malignant neoplasm of right kidney, except renal pelvis: Secondary | ICD-10-CM | POA: Diagnosis not present

## 2021-05-21 DIAGNOSIS — D4959 Neoplasm of unspecified behavior of other genitourinary organ: Secondary | ICD-10-CM

## 2021-05-21 DIAGNOSIS — Z95828 Presence of other vascular implants and grafts: Secondary | ICD-10-CM

## 2021-05-21 DIAGNOSIS — I700 Atherosclerosis of aorta: Secondary | ICD-10-CM | POA: Diagnosis not present

## 2021-05-21 DIAGNOSIS — K8020 Calculus of gallbladder without cholecystitis without obstruction: Secondary | ICD-10-CM | POA: Diagnosis not present

## 2021-05-21 DIAGNOSIS — K861 Other chronic pancreatitis: Secondary | ICD-10-CM | POA: Diagnosis not present

## 2021-05-21 LAB — CMP (CANCER CENTER ONLY)
ALT: 11 U/L (ref 0–44)
AST: 15 U/L (ref 15–41)
Albumin: 4.1 g/dL (ref 3.5–5.0)
Alkaline Phosphatase: 66 U/L (ref 38–126)
Anion gap: 9 (ref 5–15)
BUN: 15 mg/dL (ref 8–23)
CO2: 23 mmol/L (ref 22–32)
Calcium: 9.4 mg/dL (ref 8.9–10.3)
Chloride: 107 mmol/L (ref 98–111)
Creatinine: 1.35 mg/dL — ABNORMAL HIGH (ref 0.61–1.24)
GFR, Estimated: 55 mL/min — ABNORMAL LOW (ref >60)
Glucose, Bld: 154 mg/dL — ABNORMAL HIGH (ref 70–99)
Potassium: 4.8 mmol/L (ref 3.5–5.1)
Sodium: 139 mmol/L (ref 135–145)
Total Bilirubin: 0.7 mg/dL (ref 0.3–1.2)
Total Protein: 7.3 g/dL (ref 6.5–8.1)

## 2021-05-21 LAB — CBC WITH DIFFERENTIAL (CANCER CENTER ONLY)
Abs Immature Granulocytes: 0.01 10*3/uL (ref 0.00–0.07)
Basophils Absolute: 0 10*3/uL (ref 0.0–0.1)
Basophils Relative: 1 %
Eosinophils Absolute: 0.2 10*3/uL (ref 0.0–0.5)
Eosinophils Relative: 4 %
HCT: 35.7 % — ABNORMAL LOW (ref 39.0–52.0)
Hemoglobin: 12.3 g/dL — ABNORMAL LOW (ref 13.0–17.0)
Immature Granulocytes: 0 %
Lymphocytes Relative: 25 %
Lymphs Abs: 1.1 10*3/uL (ref 0.7–4.0)
MCH: 29.4 pg (ref 26.0–34.0)
MCHC: 34.5 g/dL (ref 30.0–36.0)
MCV: 85.2 fL (ref 80.0–100.0)
Monocytes Absolute: 0.3 10*3/uL (ref 0.1–1.0)
Monocytes Relative: 7 %
Neutro Abs: 2.9 10*3/uL (ref 1.7–7.7)
Neutrophils Relative %: 63 %
Platelet Count: 151 10*3/uL (ref 150–400)
RBC: 4.19 MIL/uL — ABNORMAL LOW (ref 4.22–5.81)
RDW: 13.6 % (ref 11.5–15.5)
WBC Count: 4.5 10*3/uL (ref 4.0–10.5)
nRBC: 0 % (ref 0.0–0.2)

## 2021-05-21 IMAGING — CT CT ABD-PELV W/O CM
2 of 4 series · 13 of 36 positions shown, 16 images · non-contrast
Comparison: [DATE]

CLINICAL DATA: Renal cell carcinoma. Status post right nephrectomy
and chemotherapy.

EXAM:
CT CHEST, ABDOMEN AND PELVIS WITHOUT CONTRAST
TECHNIQUE: Multidetector CT imaging of the chest, abdomen and pelvis was
performed following the standard protocol without IV contrast.

[Series 2: cap w/o · axial · non-contrast · 0.82mm/px · z∈[+1174,+1739]mm · 10 of 137 slices shown, 13 images]
[im 12/137  mediastinal]
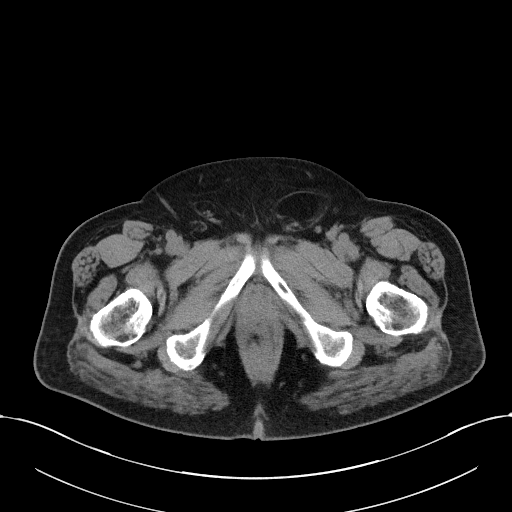
[im 12/137  lung]
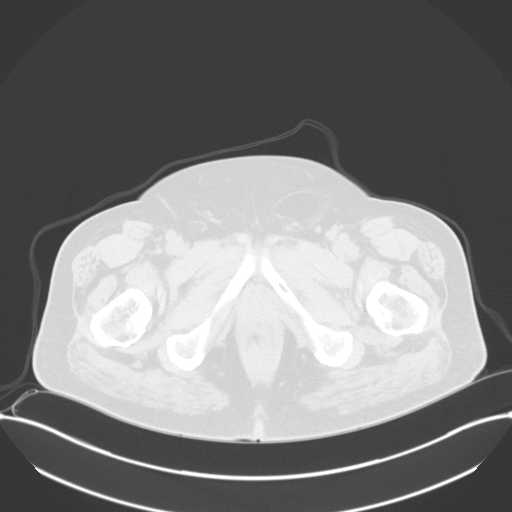
[im 23/137  lung]
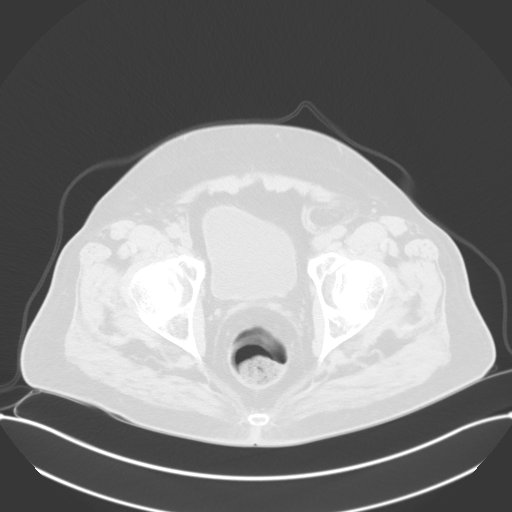
[im 35/137  lung]
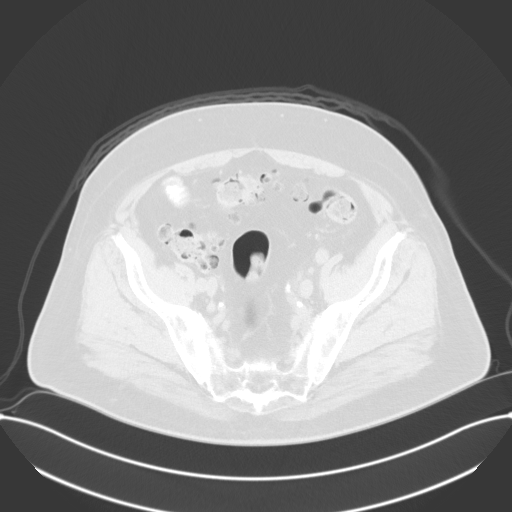
[im 46/137  lung]
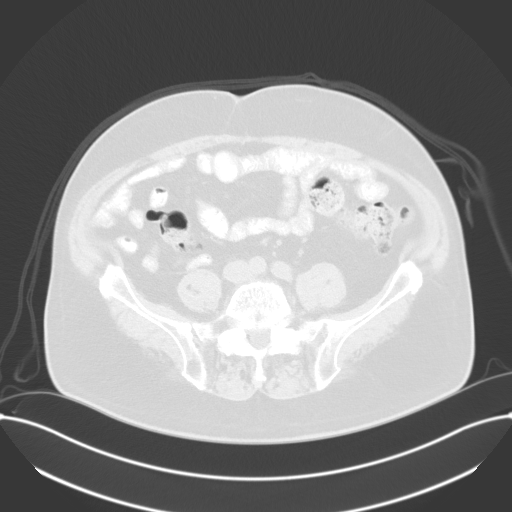
[im 57/137  mediastinal]
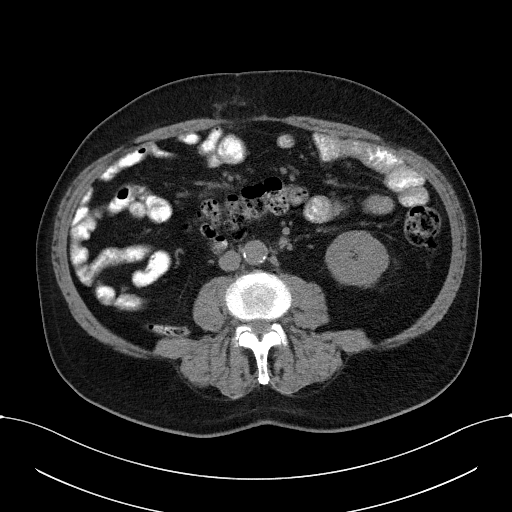
[im 57/137  lung]
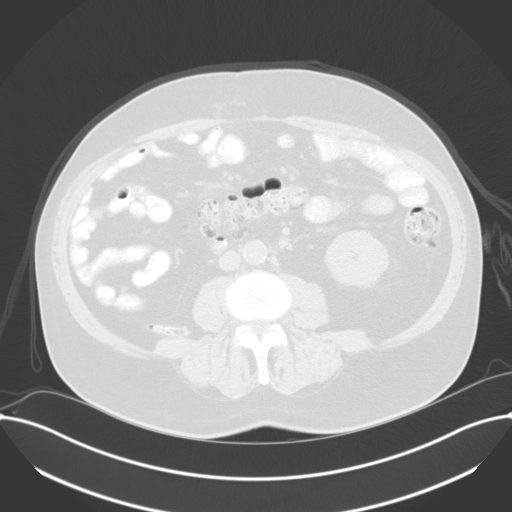
[im 80/137  lung]
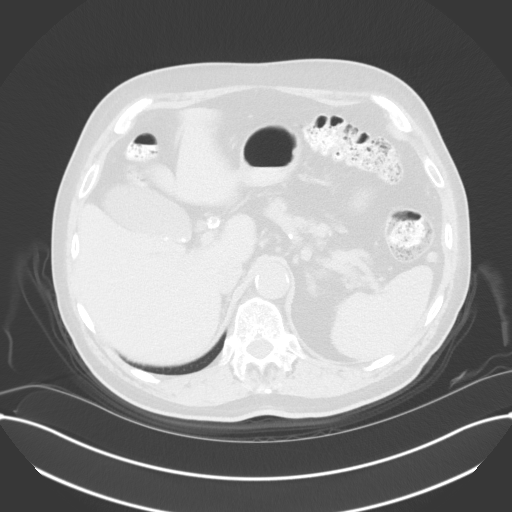
[im 91/137  lung]
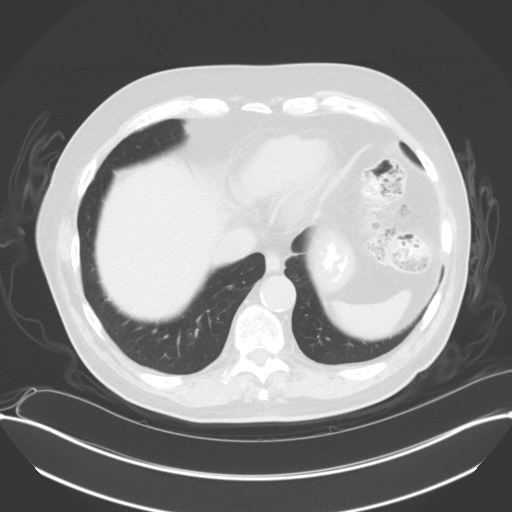
[im 103/137  lung]
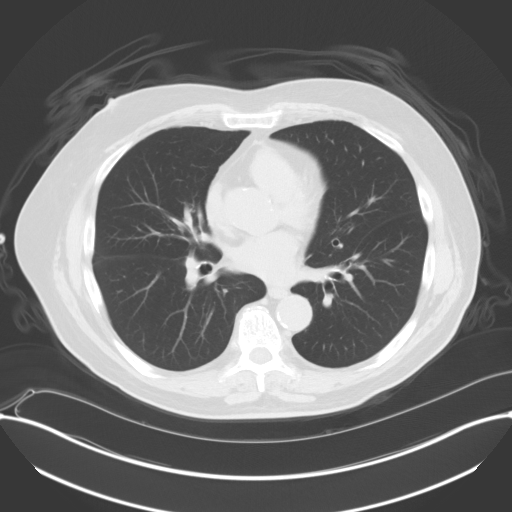
[im 114/137  mediastinal]
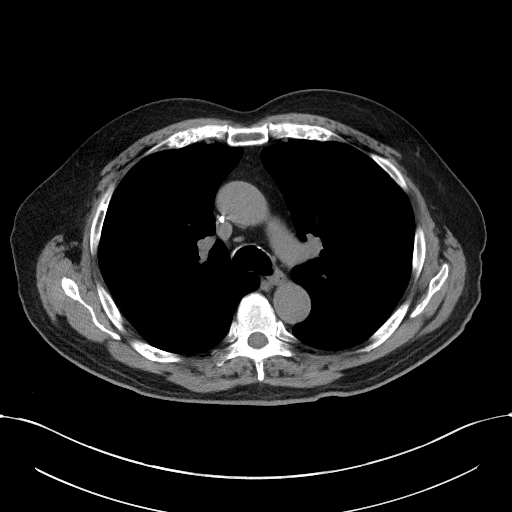
[im 114/137  lung]
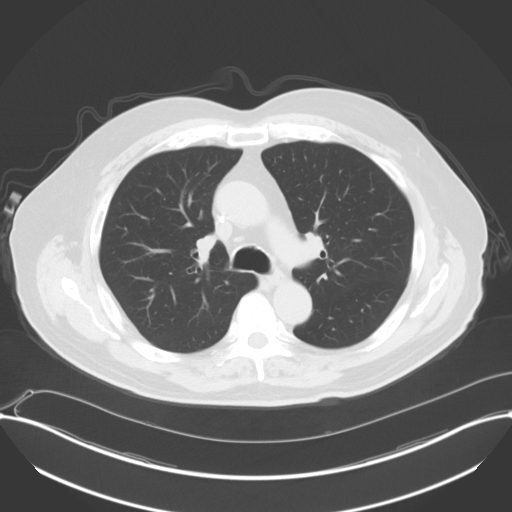
[im 125/137  lung]
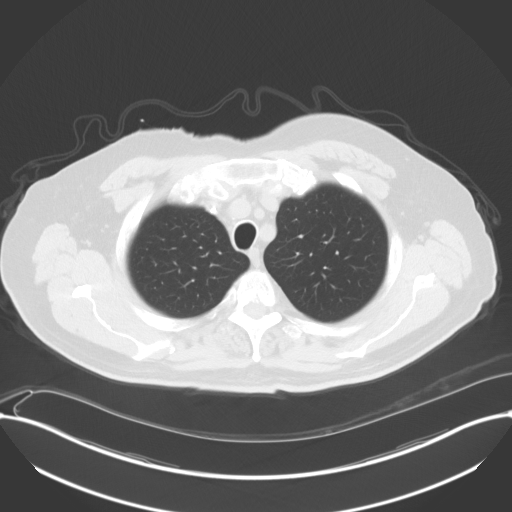

[Series 5: coronals · coronal · 0.78mm/px · 3 of 162 slices shown]
[im 33/162  lung]
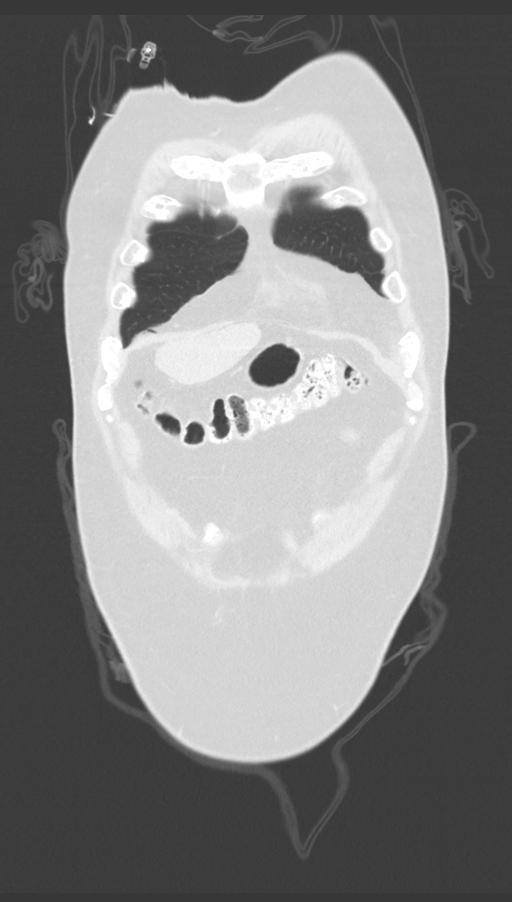
[im 65/162  lung]
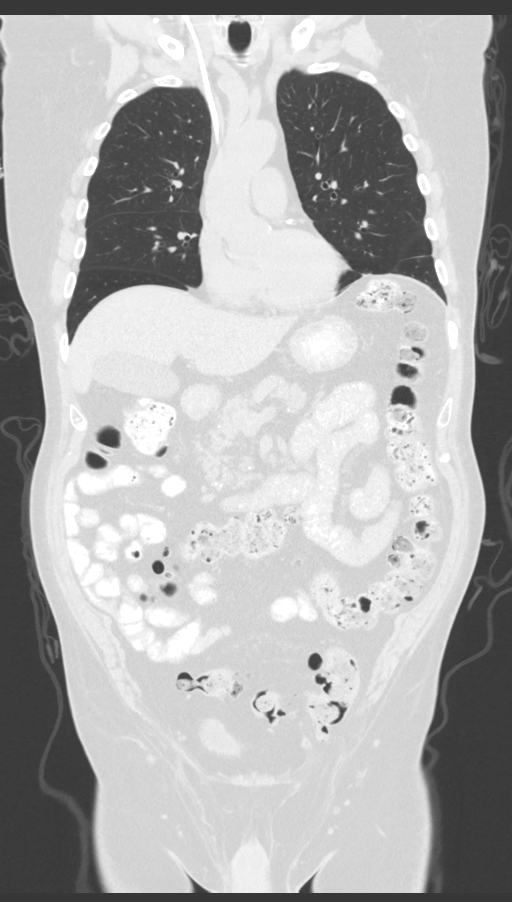
[im 97/162  lung]
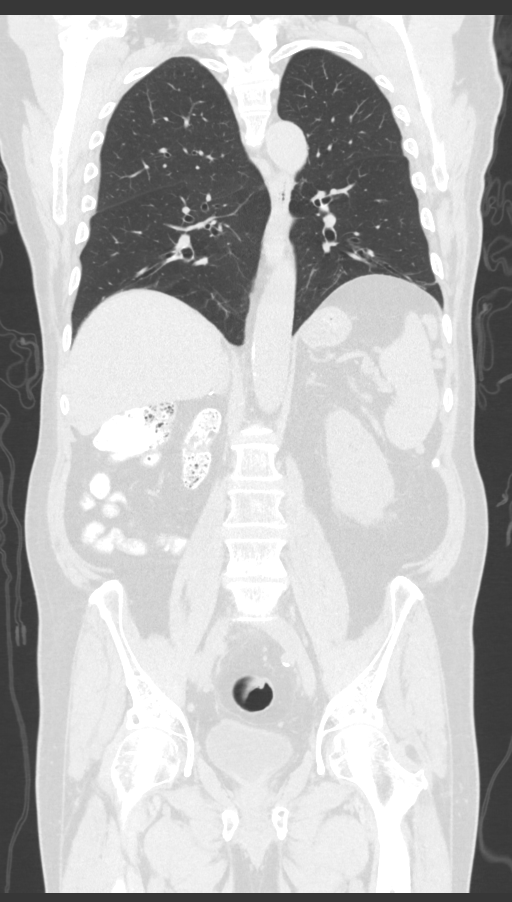

[13 of 36 positions shown; findings below may reference images not displayed]

FINDINGS: CT CHEST FINDINGS

Cardiovascular: Heart size is normal. No pericardial. Aortic
atherosclerosis. Coronary artery calcifications.

Mediastinum/Nodes: Normal appearance of the thyroid gland. The
trachea appears patent and is midline. Normal appearance of the
esophagus.

No enlarged axillary, supraclavicular, mediastinal lymph nodes.

Lungs/Pleura: No pleural effusion. No airspace consolidation. No
suspicious lung nodules identified.

Musculoskeletal: Benign sclerotic lesion within the anterior
inferior T11 vertebral body is unchanged. Likely benign bone island.
No acute or suspicious osseous findings.

CT ABDOMEN PELVIS FINDINGS

Hepatobiliary: Multiple small stones are noted within the
gallbladder. No gallbladder wall thickening or bile duct dilatation.

Pancreas: Scattered calcifications identified compatible with
chronic pancreatitis. No main duct dilatation, inflammation, or
mass.

Spleen: Normal in size without focal abnormality.

Adrenals/Urinary Tract: Status post right adrenalectomy and right
nephrectomy. No signs of local tumor recurrence within the right
nephrectomy bed. Normal left adrenal gland and left kidney. Urinary
bladder unremarkable.

Stomach/Bowel: Stomach appears normal. The appendix is visualized
and is within normal limits. Distal colonic diverticulosis without
signs of acute diverticulitis.

Vascular/Lymphatic: Aortic atherosclerosis. No aneurysm. Prominent
retroperitoneal lymph nodes are identified, including:

-pre aortic lymph node measures 0.9 cm, image 75/2. Formally 0.8 cm.

-Left periaortic node measures 1 cm, image 85/2.  Previously 0.9 cm.

-aortocaval node measures 0.7 cm, image 79/2. Stable from previous
exam.

No pelvic adenopathy.

Reproductive: Prostate is unremarkable.

Other: No free fluid or fluid collections. Supraumbilical, midline
ventral abdominal wall hernia contains fat only, image 76/2.

Musculoskeletal: No acute or suspicious osseous findings.
IMPRESSION: 1. Status post right adrenalectomy and right nephrectomy. No signs
of local tumor recurrence within the right nephrectomy bed.
2. Borderline retroperitoneal lymph nodes are stable to minimally
increased in size in the interval.
3. Aortic atherosclerosis and coronary artery calcifications.
4. Gallstones.
5. Supraumbilical, midline ventral abdominal wall hernia contains
fat only.
6. Chronic pancreatitis.

Aortic Atherosclerosis ([BF]-[BF]).

## 2021-05-21 MED ORDER — SODIUM CHLORIDE 0.9% FLUSH
10.0000 mL | Freq: Once | INTRAVENOUS | Status: AC
  Administered 2021-05-21: 10 mL

## 2021-05-21 MED ORDER — HEPARIN SOD (PORK) LOCK FLUSH 100 UNIT/ML IV SOLN: 500.0000 [IU] | Freq: Once | INTRAVENOUS | Status: AC

## 2021-05-21 MED ORDER — HEPARIN SOD (PORK) LOCK FLUSH 100 UNIT/ML IV SOLN
INTRAVENOUS | Status: AC
  Administered 2021-05-21: 500 [IU] via INTRAVENOUS
  Filled 2021-05-21: qty 5

## 2021-05-22 ENCOUNTER — Encounter: Payer: Self-pay | Admitting: Oncology

## 2021-05-23 ENCOUNTER — Telehealth: Payer: Self-pay | Admitting: Oncology

## 2021-05-23 ENCOUNTER — Telehealth: Payer: Self-pay | Admitting: Internal Medicine

## 2021-05-23 NOTE — Telephone Encounter (Signed)
Scheduled per sch msg. Mailed printout.  °

## 2021-05-23 NOTE — Telephone Encounter (Signed)
Schedule per sch msg. Called and left msg. Mailed printout  

## 2021-06-19 DIAGNOSIS — E1169 Type 2 diabetes mellitus with other specified complication: Secondary | ICD-10-CM | POA: Diagnosis not present

## 2021-06-19 DIAGNOSIS — C7910 Secondary malignant neoplasm of unspecified urinary organs: Secondary | ICD-10-CM | POA: Diagnosis not present

## 2021-06-19 DIAGNOSIS — Z20828 Contact with and (suspected) exposure to other viral communicable diseases: Secondary | ICD-10-CM | POA: Diagnosis not present

## 2021-06-19 DIAGNOSIS — I10 Essential (primary) hypertension: Secondary | ICD-10-CM | POA: Diagnosis not present

## 2021-06-19 DIAGNOSIS — C661 Malignant neoplasm of right ureter: Secondary | ICD-10-CM | POA: Diagnosis not present

## 2021-06-28 DIAGNOSIS — H25812 Combined forms of age-related cataract, left eye: Secondary | ICD-10-CM | POA: Diagnosis not present

## 2021-06-28 DIAGNOSIS — H401121 Primary open-angle glaucoma, left eye, mild stage: Secondary | ICD-10-CM | POA: Diagnosis not present

## 2021-06-28 DIAGNOSIS — Z01818 Encounter for other preprocedural examination: Secondary | ICD-10-CM | POA: Diagnosis not present

## 2021-07-04 ENCOUNTER — Other Ambulatory Visit: Payer: Self-pay

## 2021-07-04 ENCOUNTER — Inpatient Hospital Stay: Payer: Medicare Other | Attending: Oncology

## 2021-07-04 DIAGNOSIS — Z95828 Presence of other vascular implants and grafts: Secondary | ICD-10-CM

## 2021-07-04 DIAGNOSIS — Z8553 Personal history of malignant neoplasm of renal pelvis: Secondary | ICD-10-CM | POA: Insufficient documentation

## 2021-07-04 DIAGNOSIS — Z452 Encounter for adjustment and management of vascular access device: Secondary | ICD-10-CM | POA: Insufficient documentation

## 2021-07-04 MED ORDER — SODIUM CHLORIDE 0.9% FLUSH
10.0000 mL | Freq: Once | INTRAVENOUS | Status: AC
  Administered 2021-07-04: 10 mL

## 2021-07-04 MED ORDER — HEPARIN SOD (PORK) LOCK FLUSH 100 UNIT/ML IV SOLN
500.0000 [IU] | Freq: Once | INTRAVENOUS | Status: AC
  Administered 2021-07-04: 500 [IU]

## 2021-07-11 DIAGNOSIS — H25812 Combined forms of age-related cataract, left eye: Secondary | ICD-10-CM | POA: Diagnosis not present

## 2021-08-15 DIAGNOSIS — D509 Iron deficiency anemia, unspecified: Secondary | ICD-10-CM | POA: Diagnosis not present

## 2021-08-15 DIAGNOSIS — K219 Gastro-esophageal reflux disease without esophagitis: Secondary | ICD-10-CM | POA: Diagnosis not present

## 2021-08-15 DIAGNOSIS — E7849 Other hyperlipidemia: Secondary | ICD-10-CM | POA: Diagnosis not present

## 2021-08-15 DIAGNOSIS — I10 Essential (primary) hypertension: Secondary | ICD-10-CM | POA: Diagnosis not present

## 2021-08-15 DIAGNOSIS — E119 Type 2 diabetes mellitus without complications: Secondary | ICD-10-CM | POA: Diagnosis not present

## 2021-08-15 DIAGNOSIS — Z1329 Encounter for screening for other suspected endocrine disorder: Secondary | ICD-10-CM | POA: Diagnosis not present

## 2021-08-15 DIAGNOSIS — E782 Mixed hyperlipidemia: Secondary | ICD-10-CM | POA: Diagnosis not present

## 2021-08-21 ENCOUNTER — Inpatient Hospital Stay: Payer: Medicare Other | Attending: Oncology | Admitting: Oncology

## 2021-08-21 ENCOUNTER — Other Ambulatory Visit: Payer: Self-pay

## 2021-08-21 ENCOUNTER — Inpatient Hospital Stay: Payer: Medicare Other

## 2021-08-21 VITALS — BP 123/76 | HR 67 | Temp 97.7°F | Resp 17 | Ht 70.0 in | Wt 189.4 lb

## 2021-08-21 DIAGNOSIS — Z95828 Presence of other vascular implants and grafts: Secondary | ICD-10-CM

## 2021-08-21 DIAGNOSIS — R944 Abnormal results of kidney function studies: Secondary | ICD-10-CM | POA: Diagnosis not present

## 2021-08-21 DIAGNOSIS — E1169 Type 2 diabetes mellitus with other specified complication: Secondary | ICD-10-CM | POA: Diagnosis not present

## 2021-08-21 DIAGNOSIS — I10 Essential (primary) hypertension: Secondary | ICD-10-CM | POA: Diagnosis not present

## 2021-08-21 DIAGNOSIS — Z970 Presence of artificial eye: Secondary | ICD-10-CM | POA: Diagnosis not present

## 2021-08-21 DIAGNOSIS — R590 Localized enlarged lymph nodes: Secondary | ICD-10-CM | POA: Insufficient documentation

## 2021-08-21 DIAGNOSIS — D4959 Neoplasm of unspecified behavior of other genitourinary organ: Secondary | ICD-10-CM

## 2021-08-21 DIAGNOSIS — Z23 Encounter for immunization: Secondary | ICD-10-CM | POA: Diagnosis not present

## 2021-08-21 DIAGNOSIS — C7910 Secondary malignant neoplasm of unspecified urinary organs: Secondary | ICD-10-CM | POA: Diagnosis not present

## 2021-08-21 DIAGNOSIS — C661 Malignant neoplasm of right ureter: Secondary | ICD-10-CM | POA: Diagnosis not present

## 2021-08-21 DIAGNOSIS — C679 Malignant neoplasm of bladder, unspecified: Secondary | ICD-10-CM | POA: Insufficient documentation

## 2021-08-21 DIAGNOSIS — D509 Iron deficiency anemia, unspecified: Secondary | ICD-10-CM | POA: Diagnosis not present

## 2021-08-21 LAB — CBC WITH DIFFERENTIAL (CANCER CENTER ONLY)
Abs Immature Granulocytes: 0.02 10*3/uL (ref 0.00–0.07)
Basophils Absolute: 0.1 10*3/uL (ref 0.0–0.1)
Basophils Relative: 1 %
Eosinophils Absolute: 0.3 10*3/uL (ref 0.0–0.5)
Eosinophils Relative: 6 %
HCT: 35.4 % — ABNORMAL LOW (ref 39.0–52.0)
Hemoglobin: 12 g/dL — ABNORMAL LOW (ref 13.0–17.0)
Immature Granulocytes: 1 %
Lymphocytes Relative: 26 %
Lymphs Abs: 1.2 10*3/uL (ref 0.7–4.0)
MCH: 29.6 pg (ref 26.0–34.0)
MCHC: 33.9 g/dL (ref 30.0–36.0)
MCV: 87.2 fL (ref 80.0–100.0)
Monocytes Absolute: 0.3 10*3/uL (ref 0.1–1.0)
Monocytes Relative: 7 %
Neutro Abs: 2.6 10*3/uL (ref 1.7–7.7)
Neutrophils Relative %: 59 %
Platelet Count: 168 10*3/uL (ref 150–400)
RBC: 4.06 MIL/uL — ABNORMAL LOW (ref 4.22–5.81)
RDW: 13.5 % (ref 11.5–15.5)
WBC Count: 4.4 10*3/uL (ref 4.0–10.5)
nRBC: 0 % (ref 0.0–0.2)

## 2021-08-21 LAB — CMP (CANCER CENTER ONLY)
ALT: 10 U/L (ref 0–44)
AST: 12 U/L — ABNORMAL LOW (ref 15–41)
Albumin: 3.8 g/dL (ref 3.5–5.0)
Alkaline Phosphatase: 71 U/L (ref 38–126)
Anion gap: 8 (ref 5–15)
BUN: 19 mg/dL (ref 8–23)
CO2: 22 mmol/L (ref 22–32)
Calcium: 8.9 mg/dL (ref 8.9–10.3)
Chloride: 109 mmol/L (ref 98–111)
Creatinine: 1.41 mg/dL — ABNORMAL HIGH (ref 0.61–1.24)
GFR, Estimated: 52 mL/min — ABNORMAL LOW (ref >60)
Glucose, Bld: 162 mg/dL — ABNORMAL HIGH (ref 70–99)
Potassium: 4.7 mmol/L (ref 3.5–5.1)
Sodium: 139 mmol/L (ref 135–145)
Total Bilirubin: 0.6 mg/dL (ref 0.3–1.2)
Total Protein: 7 g/dL (ref 6.5–8.1)

## 2021-08-21 MED ORDER — HEPARIN SOD (PORK) LOCK FLUSH 100 UNIT/ML IV SOLN
500.0000 [IU] | Freq: Once | INTRAVENOUS | Status: AC
  Administered 2021-08-21: 500 [IU]

## 2021-08-21 MED ORDER — SODIUM CHLORIDE 0.9% FLUSH
10.0000 mL | Freq: Once | INTRAVENOUS | Status: AC
  Administered 2021-08-21: 10 mL

## 2021-08-21 NOTE — Progress Notes (Signed)
Hematology and Oncology Follow Up Visit  Hector Lopez [id number] 15-Jun-1946 75 y.o. 08/21/2021 9:34 AM [name], [name], MDBurdine, [name], MD   Principle Diagnosis: 75 year old man with ureteral cancer diagnosed and September 2021.  He was found to have stage IV high-grade urothelial carcinoma with pelvic adenopathy.  Prior Therapy:  He underwent robotic assisted laparoscopic  nephroureterectomy completed on Feb 10, 2020.  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  He was found to have metastatic pericaval lymph node as well as a right common iliac lymph node.  Carboplatin and gemcitabine started on July 04, 2020.  He completed 6 cycles of therapy on October 17, 2020.  Current therapy: Active surveillance.   Interim History: Hector Lopez presents today for a follow-up visit.  Since the last visit, he reports no major changes in his health.  He continues to enjoy a reasonable quality of life and excellent performance status.  He denies any nausea, vomiting or abdominal pain.  He denies any early satiety or changes in his bowel habits.  His performance status quality of life remained.    Medications: Updated on review. Current Outpatient Medications  Medication Sig Dispense Refill   amLODipine (NORVASC) 10 MG tablet Take 10 mg by mouth at bedtime.      cetirizine (ZYRTEC) 10 MG tablet Take 10 mg by mouth daily.     finasteride (PROSCAR) 5 MG tablet Take 5 mg by mouth daily.     glipiZIDE (GLUCOTROL) 10 MG tablet Take 10 mg by mouth daily before breakfast.      lidocaine-prilocaine (EMLA) cream Apply 1 application topically as needed. 30 g 0   lisinopril (ZESTRIL) 40 MG tablet Take 40 mg by mouth at bedtime.      metFORMIN (GLUCOPHAGE) 1000 MG tablet Take 1,000 mg by mouth 2 (two) times daily with a meal.      omeprazole (PRILOSEC) 40 MG capsule Take 40 mg by mouth daily.     prochlorperazine  (COMPAZINE) 10 MG tablet Take 1 tablet (10 mg total) by mouth every 6 (six) hours as needed for nausea or vomiting. 30 tablet 0   senna-docusate (SENOKOT-S) 8.6-50 MG tablet Take 1 tablet by mouth 2 (two) times daily. While taking strong pain meds to prevent cosntipation. 10 tablet 0   sertraline (ZOLOFT) 50 MG tablet Take 50 mg by mouth daily.     simvastatin (ZOCOR) 20 MG tablet Take 20 mg by mouth daily.      sulfamethoxazole-trimethoprim (BACTRIM DS) 800-160 MG tablet Take 1 tablet by mouth daily. Start taking one day prior to your appointment for your first follow-up and catheter removal.  Continue taking for three days. (Patient not taking: Reported on 06/20/2020) 3 tablet 0   No current facility-administered medications for this visit.     Allergies:  Allergies  Allergen Reactions   Codeine Nausea And Vomiting      Physical Exam:        ECOG: 1   General appearance: Comfortable appearing without any discomfort Head: Normocephalic without any trauma Oropharynx: Mucous membranes are moist and pink without any thrush or ulcers. Eyes: Pupils are equal and round reactive to light. Lymph nodes: No cervical, supraclavicular, inguinal or axillary lymphadenopathy.   Heart:regular rate and rhythm.  S1 and S2 without leg edema. Lung: Clear without any rhonchi or wheezes.  No dullness to percussion. Abdomin: Soft, nontender, nondistended with good bowel sounds.  No hepatosplenomegaly. Musculoskeletal: No joint  deformity or effusion.  Full range of motion noted. Neurological: No deficits noted on motor, sensory and deep tendon reflex exam. Skin: No petechial rash or dryness.  Appeared moist.               Lab Results: Lab Results  Component Value Date   WBC 4.5 05/21/2021   HGB 12.3 (L) 05/21/2021   HCT 35.7 (L) 05/21/2021   MCV 85.2 05/21/2021   PLT 151 05/21/2021     Chemistry      Component Value Date/Time   NA 139 05/21/2021 1027   K 4.8 05/21/2021 1027    CL 107 05/21/2021 1027   CO2 23 05/21/2021 1027   BUN 15 05/21/2021 1027   CREATININE 1.35 (H) 05/21/2021 1027      Component Value Date/Time   CALCIUM 9.4 05/21/2021 1027   ALKPHOS 66 05/21/2021 1027   AST 15 05/21/2021 1027   ALT 11 05/21/2021 1027   BILITOT 0.7 05/21/2021 1027           Impression and Plan:  75 year old man with:   1.  Ureteral cancer diagnosed in September 2021.  He developed stage IV high-grade urothelial carcinoma   The natural course of this disease was reviewed and treatment options were discussed.  Immunotherapy would be the best option moving forward if he developed progressive relapsed disease.  Risks and benefits associated with this treatment including nausea, fatigue, immune mediated complications among others.  The plan is to update his staging scans before the next visit and institute salvage therapy if needed.  He is agreeable to proceed at this time.  2.  IV access: Port-A-Cath will continue to be in use and flushed periodically.  3.  Renal function surveillance: Creatinine clearance remains close to baseline.    4.  Follow-up: In 6 weeks for repeat follow-up.   30  minutes were spent on this visit.  The time was dedicated to reviewing disease status, treatment choices and outlining future plan of care discussion.      Zola Button, MD 12/7/20229:34 AM

## 2021-10-09 ENCOUNTER — Other Ambulatory Visit: Payer: Self-pay

## 2021-10-09 ENCOUNTER — Ambulatory Visit (HOSPITAL_COMMUNITY)
Admission: RE | Admit: 2021-10-09 | Discharge: 2021-10-09 | Disposition: A | Discharge status: Home / Self Care | Payer: Medicare Other | Source: Ambulatory Visit | Attending: Oncology | Admitting: Oncology

## 2021-10-09 ENCOUNTER — Inpatient Hospital Stay: Payer: Medicare Other | Attending: Oncology

## 2021-10-09 ENCOUNTER — Inpatient Hospital Stay: Payer: Medicare Other

## 2021-10-09 DIAGNOSIS — K4090 Unilateral inguinal hernia, without obstruction or gangrene, not specified as recurrent: Secondary | ICD-10-CM | POA: Diagnosis not present

## 2021-10-09 DIAGNOSIS — Z95828 Presence of other vascular implants and grafts: Secondary | ICD-10-CM

## 2021-10-09 DIAGNOSIS — D4959 Neoplasm of unspecified behavior of other genitourinary organ: Secondary | ICD-10-CM

## 2021-10-09 DIAGNOSIS — K5730 Diverticulosis of large intestine without perforation or abscess without bleeding: Secondary | ICD-10-CM | POA: Diagnosis not present

## 2021-10-09 DIAGNOSIS — K439 Ventral hernia without obstruction or gangrene: Secondary | ICD-10-CM | POA: Diagnosis not present

## 2021-10-09 DIAGNOSIS — K8020 Calculus of gallbladder without cholecystitis without obstruction: Secondary | ICD-10-CM | POA: Diagnosis not present

## 2021-10-09 DIAGNOSIS — I700 Atherosclerosis of aorta: Secondary | ICD-10-CM | POA: Diagnosis not present

## 2021-10-09 LAB — CMP (CANCER CENTER ONLY)
ALT: 12 U/L (ref 0–44)
AST: 15 U/L (ref 15–41)
Albumin: 4.2 g/dL (ref 3.5–5.0)
Alkaline Phosphatase: 74 U/L (ref 38–126)
Anion gap: 7 (ref 5–15)
BUN: 18 mg/dL (ref 8–23)
CO2: 27 mmol/L (ref 22–32)
Calcium: 9.7 mg/dL (ref 8.9–10.3)
Chloride: 104 mmol/L (ref 98–111)
Creatinine: 1.27 mg/dL — ABNORMAL HIGH (ref 0.61–1.24)
GFR, Estimated: 59 mL/min — ABNORMAL LOW (ref >60)
Glucose, Bld: 200 mg/dL — ABNORMAL HIGH (ref 70–99)
Potassium: 4.7 mmol/L (ref 3.5–5.1)
Sodium: 138 mmol/L (ref 135–145)
Total Bilirubin: 0.7 mg/dL (ref 0.3–1.2)
Total Protein: 7.4 g/dL (ref 6.5–8.1)

## 2021-10-09 LAB — CBC WITH DIFFERENTIAL (CANCER CENTER ONLY)
Abs Immature Granulocytes: 0.01 10*3/uL (ref 0.00–0.07)
Basophils Absolute: 0 10*3/uL (ref 0.0–0.1)
Basophils Relative: 1 %
Eosinophils Absolute: 0.2 10*3/uL (ref 0.0–0.5)
Eosinophils Relative: 5 %
HCT: 36.3 % — ABNORMAL LOW (ref 39.0–52.0)
Hemoglobin: 12.3 g/dL — ABNORMAL LOW (ref 13.0–17.0)
Immature Granulocytes: 0 %
Lymphocytes Relative: 26 %
Lymphs Abs: 1.1 10*3/uL (ref 0.7–4.0)
MCH: 29.6 pg (ref 26.0–34.0)
MCHC: 33.9 g/dL (ref 30.0–36.0)
MCV: 87.5 fL (ref 80.0–100.0)
Monocytes Absolute: 0.3 10*3/uL (ref 0.1–1.0)
Monocytes Relative: 6 %
Neutro Abs: 2.8 10*3/uL (ref 1.7–7.7)
Neutrophils Relative %: 62 %
Platelet Count: 157 10*3/uL (ref 150–400)
RBC: 4.15 MIL/uL — ABNORMAL LOW (ref 4.22–5.81)
RDW: 13.6 % (ref 11.5–15.5)
WBC Count: 4.5 10*3/uL (ref 4.0–10.5)
nRBC: 0 % (ref 0.0–0.2)

## 2021-10-09 IMAGING — CT CT ABD-PELV W/O CM
2 of 4 series · 13 of 36 positions shown, 16 images · non-contrast
Comparison: [DATE]

CLINICAL DATA: Kidney cancer recurrence, renal cell carcinoma,
status post right nephrectomy and chemotherapy



[Series 2: cap w/o · axial · non-contrast · 0.87mm/px · z∈[-620,-80]mm · 10 of 132 slices shown, 13 images]
[im 12/132  mediastinal]
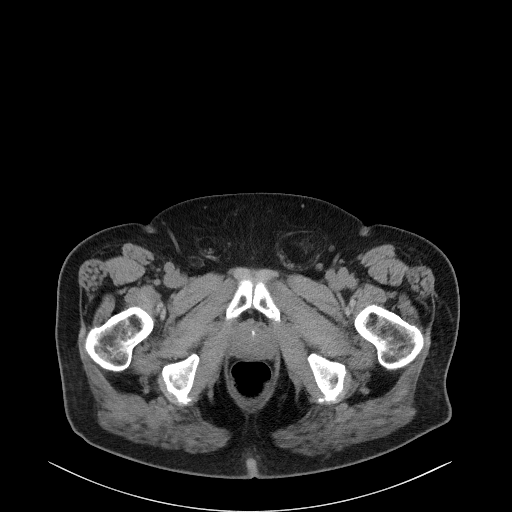
[im 12/132  lung]
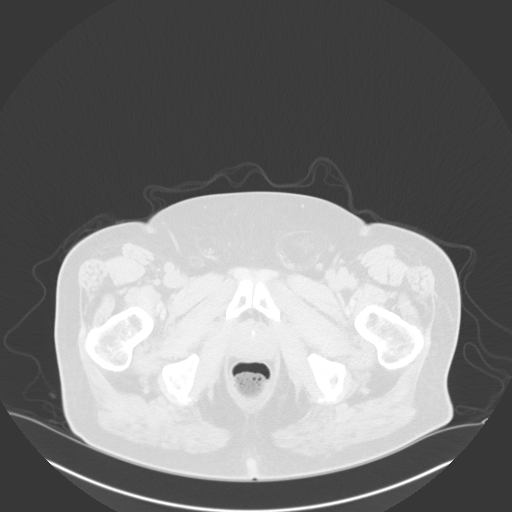
[im 24/132  lung]
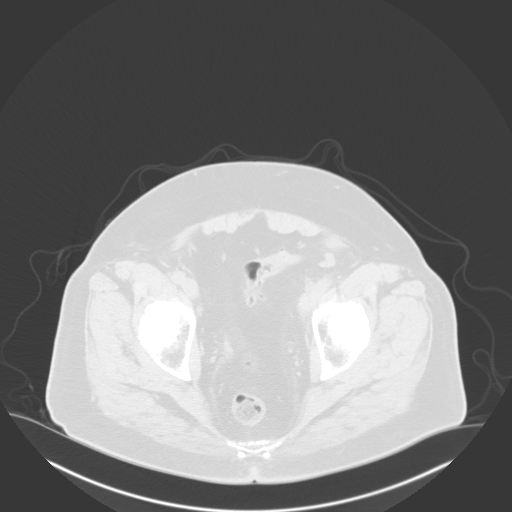
[im 36/132  lung]
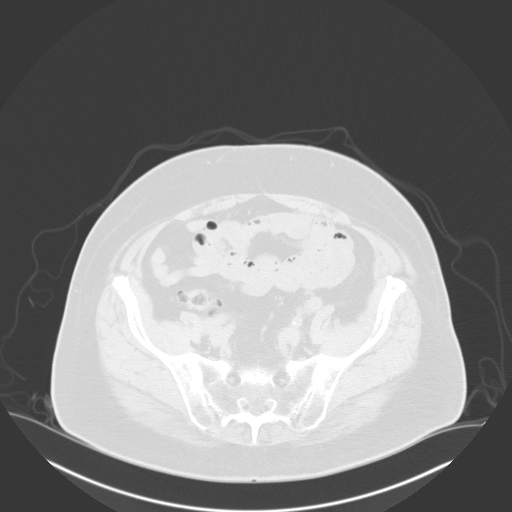
[im 48/132  lung]
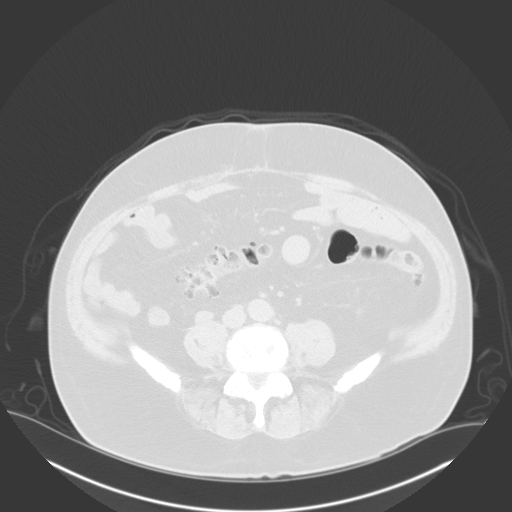
[im 60/132  mediastinal]
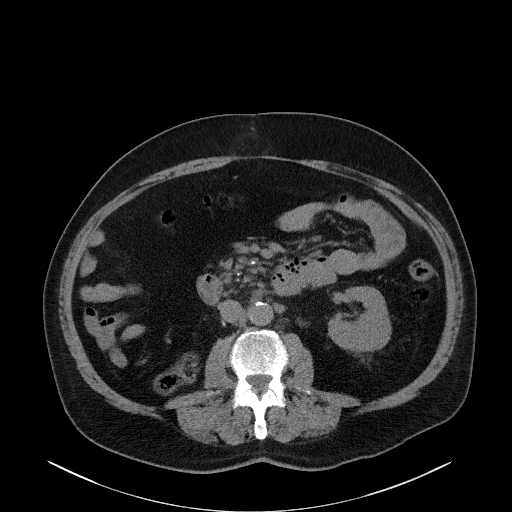
[im 60/132  lung]
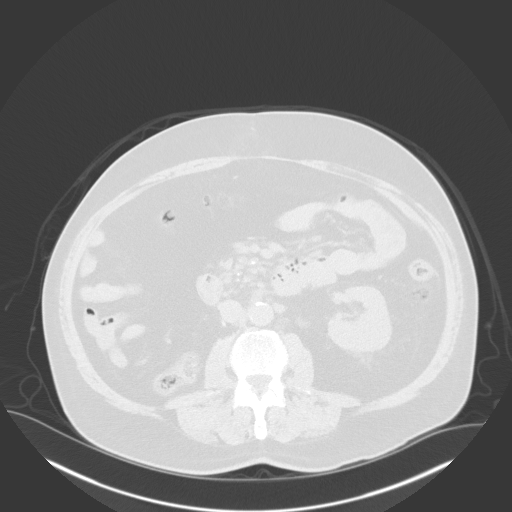
[im 72/132  lung]
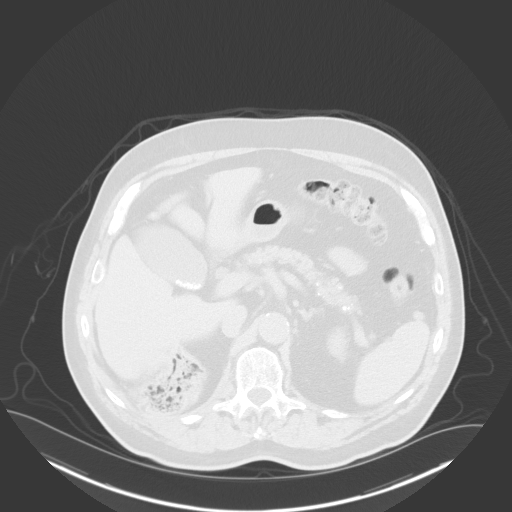
[im 84/132  lung]
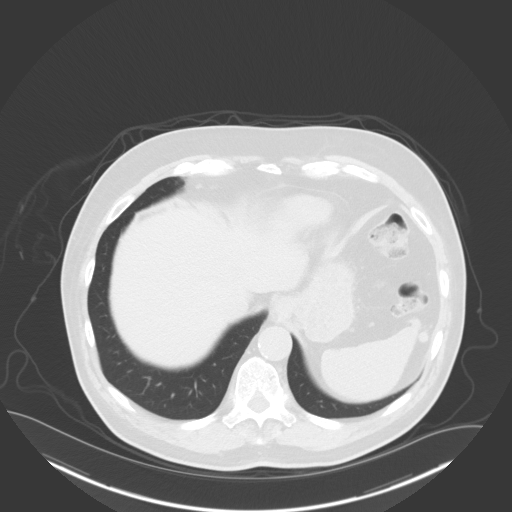
[im 96/132  lung]
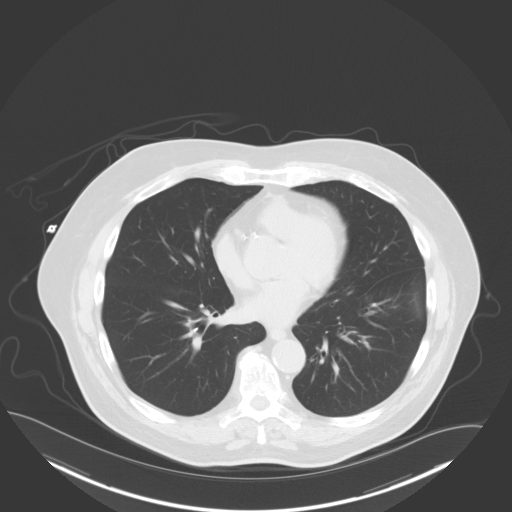
[im 108/132  mediastinal]
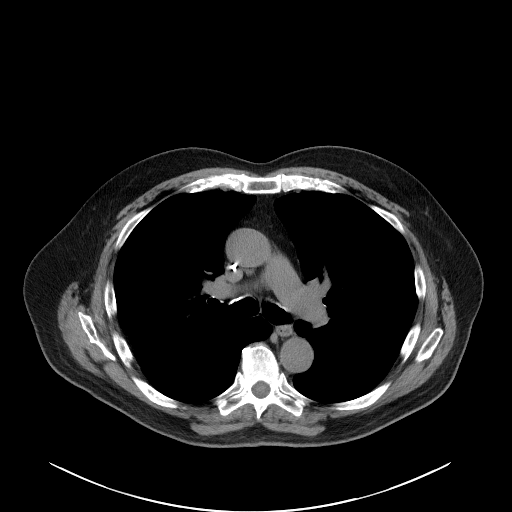
[im 108/132  lung]
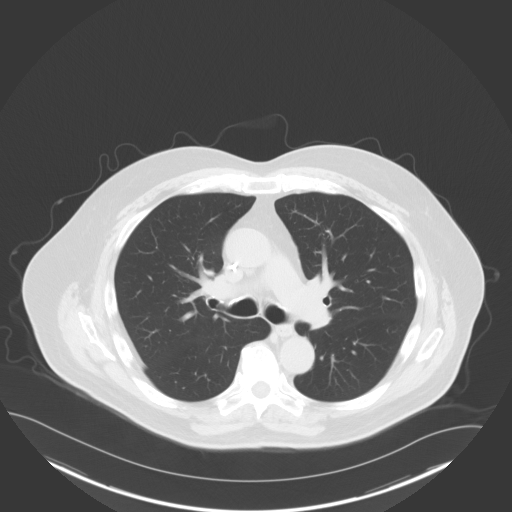
[im 120/132  lung]
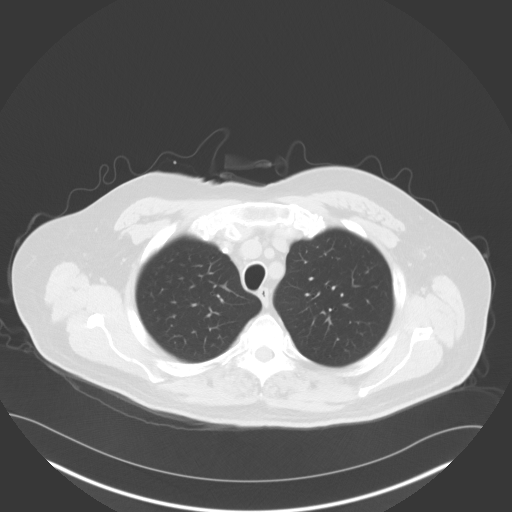

[Series 4: coronals · coronal · 0.81mm/px · 3 of 161 slices shown]
[im 33/161  lung]
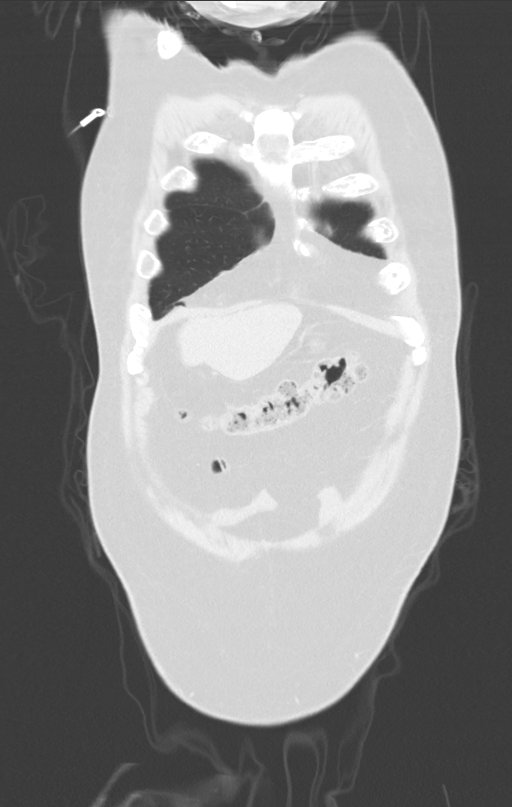
[im 65/161  lung]
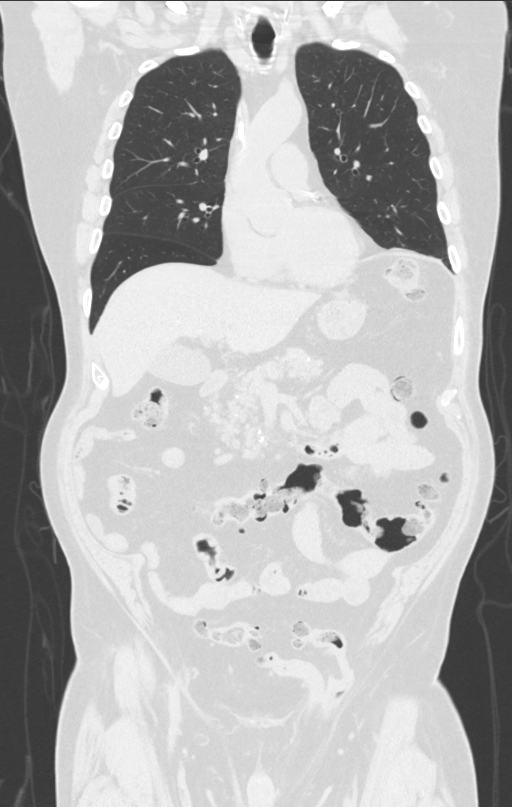
[im 97/161  lung]
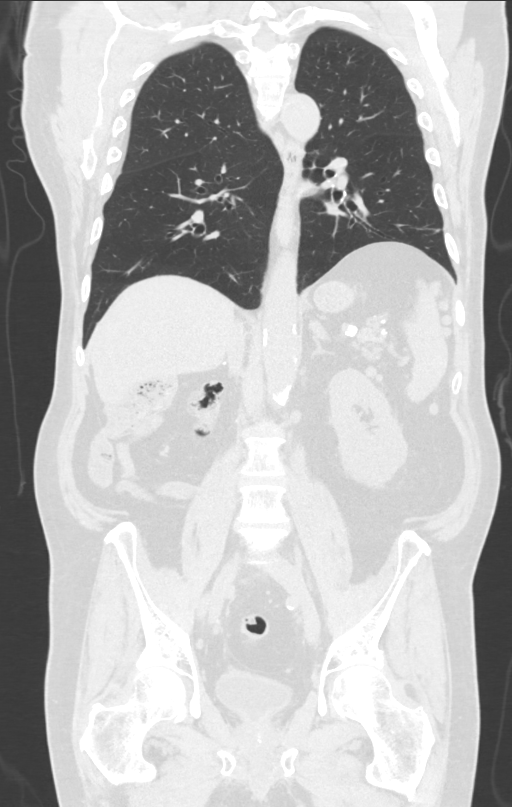

[13 of 36 positions shown; findings below may reference images not displayed]

FINDINGS: CT CHEST FINDINGS

Cardiovascular: Right chest port catheter. Aortic atherosclerosis.
Normal heart size. Left coronary artery calcifications. No
pericardial effusion.

Mediastinum/Nodes: No enlarged mediastinal, hilar, or axillary lymph
nodes. Thyroid gland, trachea, and esophagus demonstrate no
significant findings.

Lungs/Pleura: Lungs are clear. No pleural effusion or pneumothorax.

Musculoskeletal: No chest wall mass or suspicious osseous lesions
identified.

CT ABDOMEN PELVIS FINDINGS

Hepatobiliary: No solid liver abnormality is seen. Tiny gallstones
in the dependent gallbladder. No gallbladder wall thickening, or
biliary dilatation.

Pancreas: Scattered parenchymal calcifications throughout the
pancreas. No pancreatic ductal dilatation or surrounding acute
inflammatory changes.

Spleen: Normal in size without significant abnormality.

Adrenals/Urinary Tract: Status post right adrenalectomy. The left
adrenal gland is normal. Status post right nephrectomy. The left
kidney is normal in noncontrast appearance. No calculi or
hydronephrosis. Bladder is unremarkable.

Stomach/Bowel: Stomach is within normal limits. Appendix appears
normal. No evidence of bowel wall thickening, distention, or
inflammatory changes. Descending and sigmoid diverticulosis.

Vascular/Lymphatic: Aortic atherosclerosis. Continued slight
interval enlargement of multiple retroperitoneal lymph nodes,
largest periaortic node measuring 1.4 x 1.2 cm, previously 1.2 x
cm (series 2, image 71). Other nodes are not appreciably changed,
for example an enlarged low left retroperitoneal node measuring
x 1.0 cm (series 2, image 82).

Reproductive: Mild prostatomegaly with probable TURP defect.

Other: Moderate left inguinal hernia containing fat and a
nonobstructed loop of sigmoid colon (series 2, image 119).
Diastasis rectus with a small fat containing midline ventral hernia
(series 2, image 70). No ascites.

Musculoskeletal: No acute osseous findings.
IMPRESSION: 1. Status post right nephrectomy and right adrenalectomy. No
noncontrast CT evidence locally recurrent soft tissue.
2. Continued slight interval enlargement of multiple retroperitoneal
lymph nodes, other nodes not appreciably changed. These remain
highly suspicious for nodal metastatic disease.
3. No evidence of metastatic disease in the chest.
4. Cholelithiasis.
5. Stigmata of chronic pancreatitis.
6. Coronary artery disease.

Aortic Atherosclerosis ([01]-[01]).

## 2021-10-09 IMAGING — CT CT CHEST W/O CM
2 of 4 series · 13 of 36 positions shown, 16 images · non-contrast
Comparison: [DATE]

CLINICAL DATA: Kidney cancer recurrence, renal cell carcinoma,
status post right nephrectomy and chemotherapy



[Series 2: cap w/o · axial · non-contrast · 0.87mm/px · z∈[-620,-80]mm · 10 of 132 slices shown, 13 images]
[im 12/132  mediastinal]
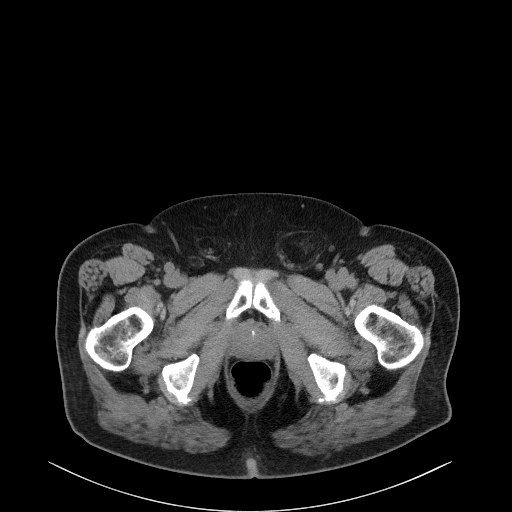
[im 12/132  lung]
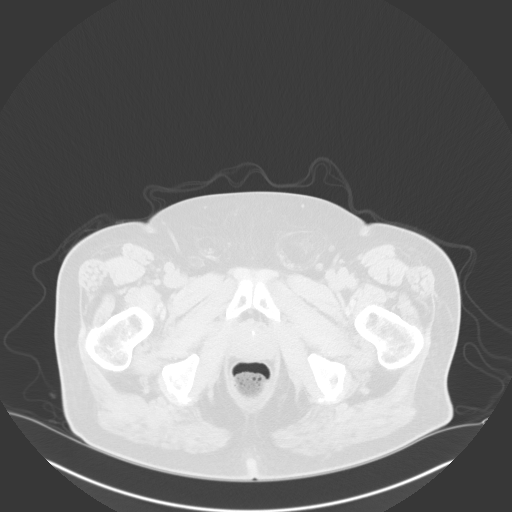
[im 24/132  lung]
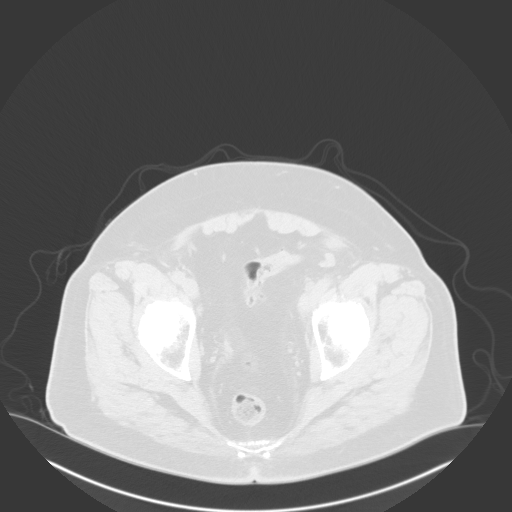
[im 36/132  lung]
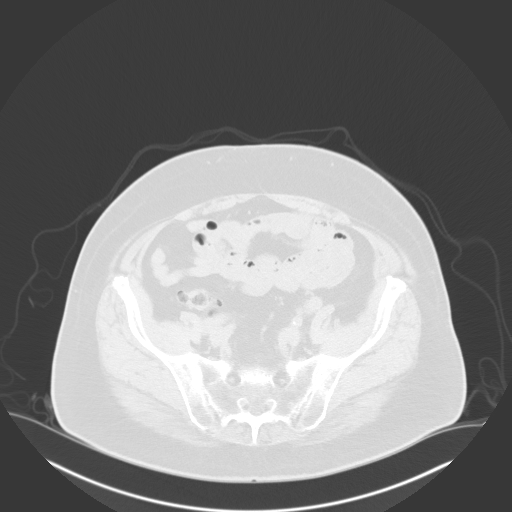
[im 48/132  lung]
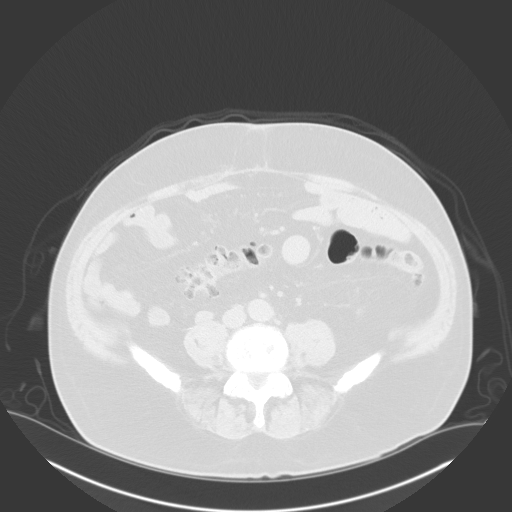
[im 60/132  mediastinal]
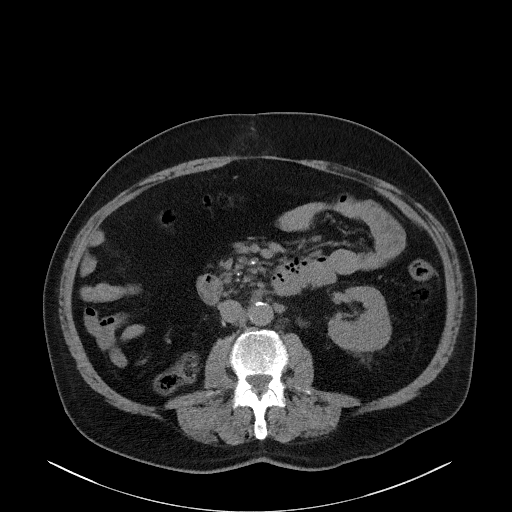
[im 60/132  lung]
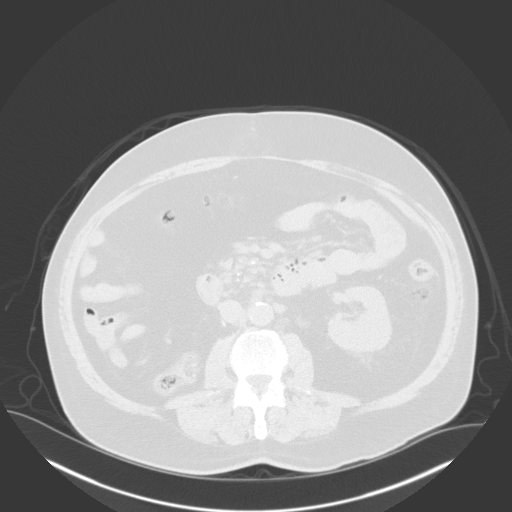
[im 72/132  lung]
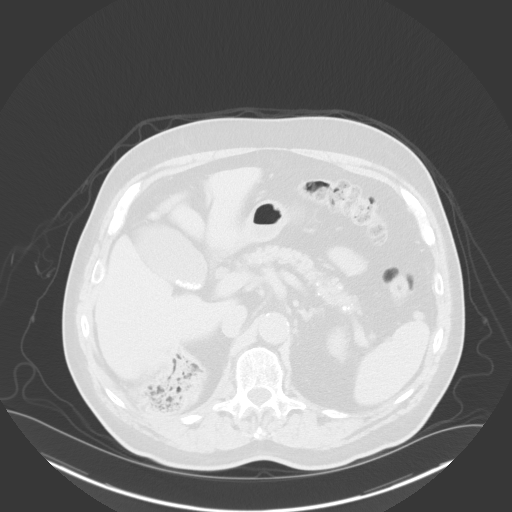
[im 84/132  lung]
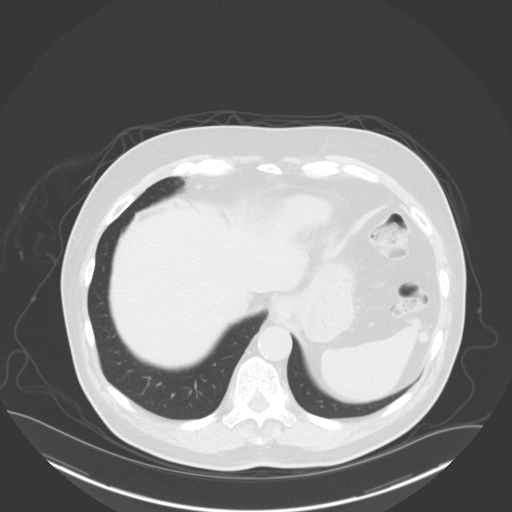
[im 96/132  lung]
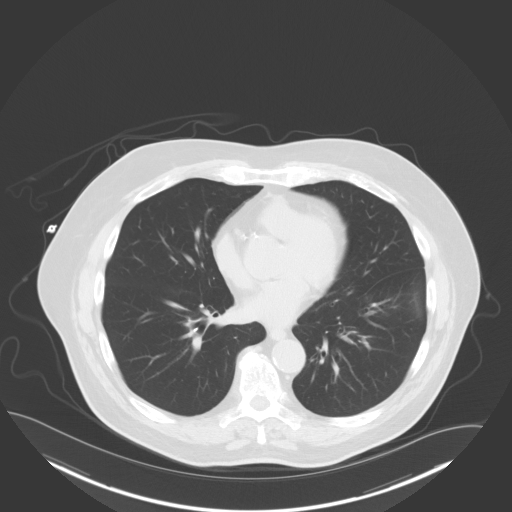
[im 108/132  mediastinal]
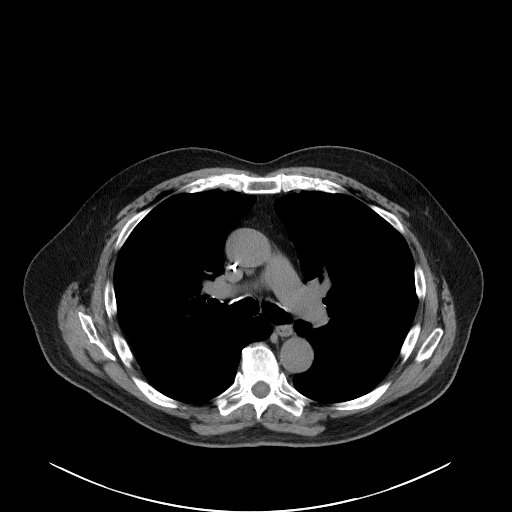
[im 108/132  lung]
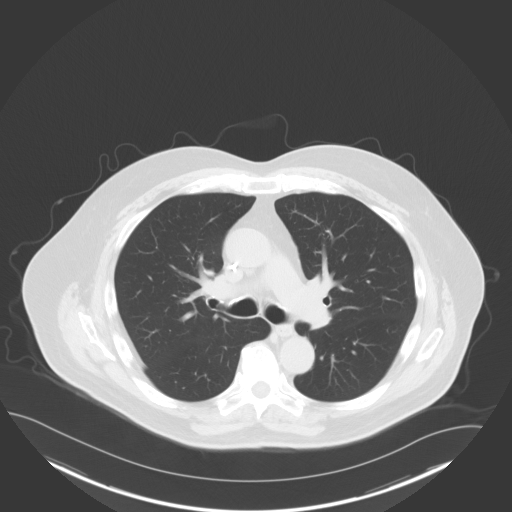
[im 120/132  lung]
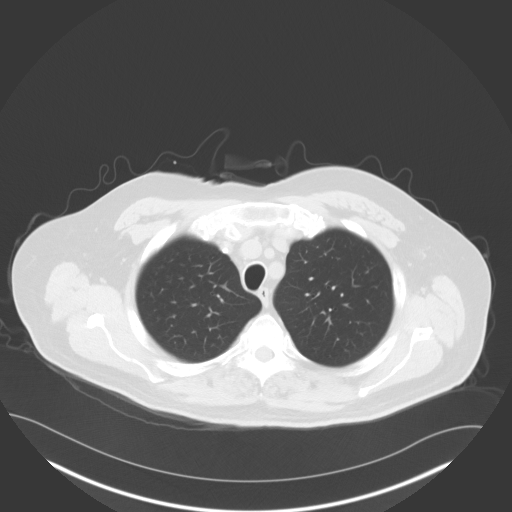

[Series 4: coronals · coronal · 0.81mm/px · 3 of 161 slices shown]
[im 33/161  lung]
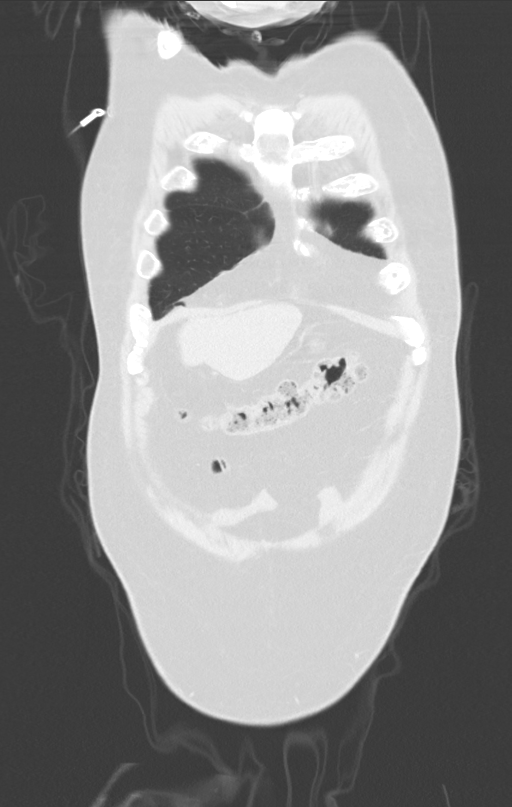
[im 65/161  lung]
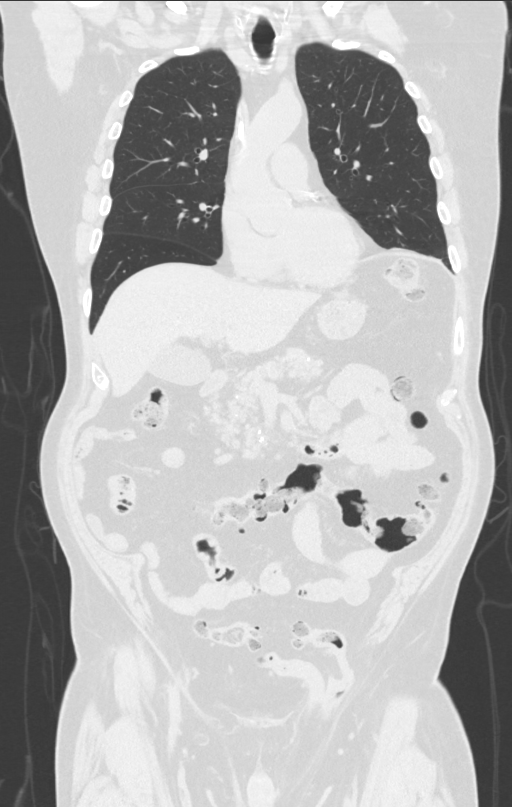
[im 97/161  lung]
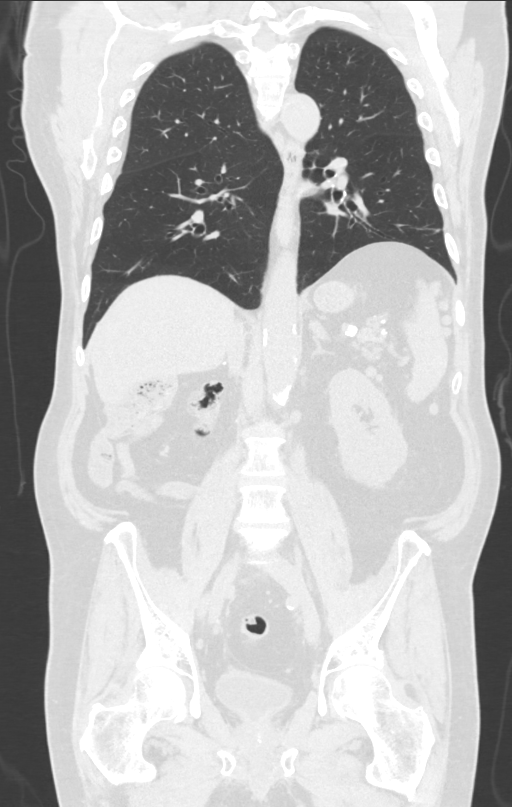

[13 of 36 positions shown; findings below may reference images not displayed]

FINDINGS: CT CHEST FINDINGS

Cardiovascular: Right chest port catheter. Aortic atherosclerosis.
Normal heart size. Left coronary artery calcifications. No
pericardial effusion.

Mediastinum/Nodes: No enlarged mediastinal, hilar, or axillary lymph
nodes. Thyroid gland, trachea, and esophagus demonstrate no
significant findings.

Lungs/Pleura: Lungs are clear. No pleural effusion or pneumothorax.

Musculoskeletal: No chest wall mass or suspicious osseous lesions
identified.

CT ABDOMEN PELVIS FINDINGS

Hepatobiliary: No solid liver abnormality is seen. Tiny gallstones
in the dependent gallbladder. No gallbladder wall thickening, or
biliary dilatation.

Pancreas: Scattered parenchymal calcifications throughout the
pancreas. No pancreatic ductal dilatation or surrounding acute
inflammatory changes.

Spleen: Normal in size without significant abnormality.

Adrenals/Urinary Tract: Status post right adrenalectomy. The left
adrenal gland is normal. Status post right nephrectomy. The left
kidney is normal in noncontrast appearance. No calculi or
hydronephrosis. Bladder is unremarkable.

Stomach/Bowel: Stomach is within normal limits. Appendix appears
normal. No evidence of bowel wall thickening, distention, or
inflammatory changes. Descending and sigmoid diverticulosis.

Vascular/Lymphatic: Aortic atherosclerosis. Continued slight
interval enlargement of multiple retroperitoneal lymph nodes,
largest periaortic node measuring 1.4 x 1.2 cm, previously 1.2 x
cm (series 2, image 71). Other nodes are not appreciably changed,
for example an enlarged low left retroperitoneal node measuring
x 1.0 cm (series 2, image 82).

Reproductive: Mild prostatomegaly with probable TURP defect.

Other: Moderate left inguinal hernia containing fat and a
nonobstructed loop of sigmoid colon (series 2, image 119).
Diastasis rectus with a small fat containing midline ventral hernia
(series 2, image 70). No ascites.

Musculoskeletal: No acute osseous findings.
IMPRESSION: 1. Status post right nephrectomy and right adrenalectomy. No
noncontrast CT evidence locally recurrent soft tissue.
2. Continued slight interval enlargement of multiple retroperitoneal
lymph nodes, other nodes not appreciably changed. These remain
highly suspicious for nodal metastatic disease.
3. No evidence of metastatic disease in the chest.
4. Cholelithiasis.
5. Stigmata of chronic pancreatitis.
6. Coronary artery disease.

Aortic Atherosclerosis ([01]-[01]).

## 2021-10-09 MED ORDER — HEPARIN SOD (PORK) LOCK FLUSH 100 UNIT/ML IV SOLN
INTRAVENOUS | Status: AC
  Filled 2021-10-09: qty 5

## 2021-10-09 MED ORDER — HEPARIN SOD (PORK) LOCK FLUSH 100 UNIT/ML IV SOLN
500.0000 [IU] | Freq: Once | INTRAVENOUS | Status: AC
  Administered 2021-10-09: 12:00:00 500 [IU] via INTRAVENOUS

## 2021-10-09 MED ORDER — SODIUM CHLORIDE 0.9% FLUSH
10.0000 mL | Freq: Once | INTRAVENOUS | Status: AC
  Administered 2021-10-09: 11:00:00 10 mL

## 2021-10-17 ENCOUNTER — Other Ambulatory Visit: Payer: Self-pay

## 2021-10-17 ENCOUNTER — Inpatient Hospital Stay: Payer: Medicare Other | Attending: Oncology | Admitting: Oncology

## 2021-10-17 VITALS — BP 133/70 | HR 71 | Temp 98.1°F | Resp 17 | Ht 70.0 in | Wt 193.0 lb

## 2021-10-17 DIAGNOSIS — Z5112 Encounter for antineoplastic immunotherapy: Secondary | ICD-10-CM | POA: Insufficient documentation

## 2021-10-17 DIAGNOSIS — Z79899 Other long term (current) drug therapy: Secondary | ICD-10-CM | POA: Diagnosis not present

## 2021-10-17 DIAGNOSIS — D4959 Neoplasm of unspecified behavior of other genitourinary organ: Secondary | ICD-10-CM

## 2021-10-17 DIAGNOSIS — C679 Malignant neoplasm of bladder, unspecified: Secondary | ICD-10-CM | POA: Insufficient documentation

## 2021-10-17 MED ORDER — PROCHLORPERAZINE MALEATE 10 MG PO TABS: 10.0000 mg | ORAL_TABLET | Freq: Four times a day (QID) | ORAL | 0 refills | Status: DC | PRN

## 2021-10-17 NOTE — Progress Notes (Signed)
DISCONTINUE ON PATHWAY REGIMEN - Bladder     A cycle is every 21 days:     Carboplatin      Gemcitabine   **Always confirm dose/schedule in your pharmacy ordering system**  REASON: Disease Progression PRIOR TREATMENT: [id number]: Carboplatin AUC=5 D1 + Gemcitabine 1,000 mg/m2 D1, 8 q21 Days for a Maximum of 6 Cycles TREATMENT RESPONSE: Partial Response (PR)  START ON PATHWAY REGIMEN - Bladder     A cycle is every 28 days:     Nivolumab   **Always confirm dose/schedule in your pharmacy ordering system**  Patient Characteristics: Advanced/Metastatic Disease, Second Line, FGFR2/FGFR3 Mutation Negative or Unknown, Prior Platinum-Based Therapy and No Prior PD-1/PD-L1 Inhibitor Therapeutic Status: Advanced/Metastatic Disease Line of Therapy: Second Line FGFR2/FGFR3 Mutation Status: Did Not Order Test Intent of Therapy: Non-Curative / Palliative Intent, Discussed with Patient

## 2021-10-17 NOTE — Progress Notes (Signed)
Hematology and Oncology Follow Up Visit  Matyas Baisley [id number] 06/25/1946 76 y.o. 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  Prior Therapy:  He underwent robotic assisted laparoscopic  nephroureterectomy completed on Feb 10, 2020.  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  He was found to have metastatic pericaval lymph node as well as a right common iliac lymph node.  Carboplatin and gemcitabine started on July 04, 2020.  He completed 6 cycles of therapy on October 17, 2020.  Current therapy: Under consideration for salvage therapy.   Interim History: Mr. Delima returns today for repeat evaluation.  Since the last visit, he reports feeling well without any major complaints.  He denies any nausea, vomiting or abdominal pain.  He denies any recent hospitalizations or illnesses.    Medications: Reviewed and updated. Current Outpatient Medications  Medication Sig Dispense Refill   amLODipine (NORVASC) 10 MG tablet Take 10 mg by mouth at bedtime.      cetirizine (ZYRTEC) 10 MG tablet Take 10 mg by mouth daily.     finasteride (PROSCAR) 5 MG tablet Take 5 mg by mouth daily.     glipiZIDE (GLUCOTROL) 10 MG tablet Take 10 mg by mouth daily before breakfast.      lidocaine-prilocaine (EMLA) cream Apply 1 application topically as needed. 30 g 0   lisinopril (ZESTRIL) 40 MG tablet Take 40 mg by mouth at bedtime.      metFORMIN (GLUCOPHAGE) 1000 MG tablet Take 1,000 mg by mouth 2 (two) times daily with a meal.      omeprazole (PRILOSEC) 40 MG capsule Take 40 mg by mouth daily.     prochlorperazine (COMPAZINE) 10 MG tablet Take 1 tablet (10 mg total) by mouth every 6 (six) hours as needed for nausea or vomiting. 30 tablet 0   senna-docusate (SENOKOT-S) 8.6-50 MG  tablet Take 1 tablet by mouth 2 (two) times daily. While taking strong pain meds to prevent cosntipation. 10 tablet 0   sertraline (ZOLOFT) 50 MG tablet Take 50 mg by mouth daily.     simvastatin (ZOCOR) 20 MG tablet Take 20 mg by mouth daily.      sulfamethoxazole-trimethoprim (BACTRIM DS) 800-160 MG tablet Take 1 tablet by mouth daily. Start taking one day prior to your appointment for your first follow-up and catheter removal.  Continue taking for three days. (Patient not taking: Reported on 06/20/2020) 3 tablet 0   No current facility-administered medications for this visit.     Allergies:  Allergies  Allergen Reactions   Codeine Nausea And Vomiting      Physical Exam:    Blood pressure 133/70, pulse 71, temperature 98.1 F (36.7 C), temperature source Temporal, resp. rate 17, height 5\' 10"  (1.778 m), weight 193 lb (87.5 kg), SpO2 100 %.     ECOG: 1    General appearance: Alert, awake without any distress. Head: Atraumatic without abnormalities Oropharynx: Without any thrush or ulcers. Eyes: No scleral icterus. Lymph nodes: No lymphadenopathy noted in the cervical, supraclavicular, or axillary nodes Heart:regular rate and rhythm, without any murmurs or gallops.   Lung: Clear to auscultation without any rhonchi, wheezes or dullness to percussion. Abdomin: Soft, nontender without any shifting dullness or ascites. Musculoskeletal: No clubbing or cyanosis. Neurological: No motor or sensory deficits. Skin: No rashes or lesions.  Lab Results: Lab Results  Component Value Date   WBC 4.5 10/09/2021   HGB 12.3 (L) 10/09/2021   HCT 36.3 (L) 10/09/2021   MCV 87.5 10/09/2021   PLT 157 10/09/2021     Chemistry      Component Value Date/Time   NA 138 10/09/2021 1121   K 4.7 10/09/2021 1121   CL 104 10/09/2021 1121   CO2 27 10/09/2021 1121   BUN 18 10/09/2021 1121   CREATININE 1.27 (H) 10/09/2021 1121      Component Value Date/Time   CALCIUM  9.7 10/09/2021 1121   ALKPHOS 74 10/09/2021 1121   AST 15 10/09/2021 1121   ALT 12 10/09/2021 1121   BILITOT 0.7 10/09/2021 1121       IMPRESSION: 1. Status post right nephrectomy and right adrenalectomy. No noncontrast CT evidence locally recurrent soft tissue. 2. Continued slight interval enlargement of multiple retroperitoneal lymph nodes, other nodes not appreciably changed. These remain highly suspicious for nodal metastatic disease. 3. No evidence of metastatic disease in the chest. 4. Cholelithiasis. 5. Stigmata of chronic pancreatitis. 6. Coronary artery disease.   Aortic Atherosclerosis (ICD10-I70.0).    Impression and Plan:  76 year old man with:   1.  Stage IV high-grade urothelial carcinoma of the ureter diagnosed in 2021.  CT scan obtained on October 09, 2021 were personally reviewed and showed slight interval enlargement in his retroperitoneal lymph nodes.  Risks and benefits of starting salvage therapy versus continued surveillance were reviewed.  Immunotherapy remains as best option at this time.  Complications that include immune mediated issues, GI toxicity among others were reviewed.  Nivolumab every 4 weeks could be considered at this time.  After discussion he is agreeable to proceed.  2.  IV access: Port-A-Cath will be used for infusion.  3.  Immune mediated issues: I continue to educate him about these complications including pneumonitis, colitis and thyroid disease.  4.  Antiemetics: Prescription for Compazine is available to him will be refilled if needed.    5.  Follow-up: will be in the near future to start therapy.   30  minutes were dedicated to this encounter.  Time was spent on reviewing laboratory data, imaging studies, treatment choices and future plan of care discussion.      Zola Button, MD 2/2/202311:44 AM

## 2021-10-22 NOTE — Progress Notes (Signed)
Pharmacist Chemotherapy Monitoring - Initial Assessment    Anticipated start date: 10/29/21   The following has been reviewed per standard work regarding the patient's treatment regimen: The patient's diagnosis, treatment plan and drug doses, and organ/hematologic function Lab orders and baseline tests specific to treatment regimen  The treatment plan start date, drug sequencing, and pre-medications Prior authorization status  Patient's documented medication list, including drug-drug interaction screen and prescriptions for anti-emetics and supportive care specific to the treatment regimen The drug concentrations, fluid compatibility, administration routes, and timing of the medications to be used The patient's access for treatment and lifetime cumulative dose history, if applicable  The patient's medication allergies and previous infusion related reactions, if applicable   Changes made to treatment plan:  Ordered TSH  Follow up needed:  Pending authorization for treatment    Acquanetta Belling, RPH, BCPS, BCOP 10/22/2021  3:11 PM

## 2021-10-25 ENCOUNTER — Telehealth: Payer: Self-pay | Admitting: Oncology

## 2021-10-25 NOTE — Telephone Encounter (Signed)
Scheduled per 02/02 los, patient has been called and voicemail was left.

## 2021-10-29 ENCOUNTER — Other Ambulatory Visit: Payer: Self-pay

## 2021-10-29 ENCOUNTER — Inpatient Hospital Stay: Payer: Medicare Other

## 2021-10-29 VITALS — BP 150/87 | HR 79 | Temp 98.5°F | Resp 17

## 2021-10-29 DIAGNOSIS — C679 Malignant neoplasm of bladder, unspecified: Secondary | ICD-10-CM | POA: Diagnosis not present

## 2021-10-29 DIAGNOSIS — Z79899 Other long term (current) drug therapy: Secondary | ICD-10-CM | POA: Diagnosis not present

## 2021-10-29 DIAGNOSIS — D4959 Neoplasm of unspecified behavior of other genitourinary organ: Secondary | ICD-10-CM

## 2021-10-29 DIAGNOSIS — Z95828 Presence of other vascular implants and grafts: Secondary | ICD-10-CM

## 2021-10-29 DIAGNOSIS — Z5112 Encounter for antineoplastic immunotherapy: Secondary | ICD-10-CM | POA: Diagnosis not present

## 2021-10-29 LAB — CBC WITH DIFFERENTIAL (CANCER CENTER ONLY)
Abs Immature Granulocytes: 0 10*3/uL (ref 0.00–0.07)
Basophils Absolute: 0 10*3/uL (ref 0.0–0.1)
Basophils Relative: 1 %
Eosinophils Absolute: 0.2 10*3/uL (ref 0.0–0.5)
Eosinophils Relative: 3 %
HCT: 36.7 % — ABNORMAL LOW (ref 39.0–52.0)
Hemoglobin: 12.7 g/dL — ABNORMAL LOW (ref 13.0–17.0)
Immature Granulocytes: 0 %
Lymphocytes Relative: 22 %
Lymphs Abs: 1.1 10*3/uL (ref 0.7–4.0)
MCH: 30 pg (ref 26.0–34.0)
MCHC: 34.6 g/dL (ref 30.0–36.0)
MCV: 86.6 fL (ref 80.0–100.0)
Monocytes Absolute: 0.3 10*3/uL (ref 0.1–1.0)
Monocytes Relative: 6 %
Neutro Abs: 3.4 10*3/uL (ref 1.7–7.7)
Neutrophils Relative %: 68 %
Platelet Count: 151 10*3/uL (ref 150–400)
RBC: 4.24 MIL/uL (ref 4.22–5.81)
RDW: 13.5 % (ref 11.5–15.5)
WBC Count: 5 10*3/uL (ref 4.0–10.5)
nRBC: 0 % (ref 0.0–0.2)

## 2021-10-29 LAB — CMP (CANCER CENTER ONLY)
ALT: 14 U/L (ref 0–44)
AST: 18 U/L (ref 15–41)
Albumin: 4.2 g/dL (ref 3.5–5.0)
Alkaline Phosphatase: 61 U/L (ref 38–126)
Anion gap: 7 (ref 5–15)
BUN: 24 mg/dL — ABNORMAL HIGH (ref 8–23)
CO2: 25 mmol/L (ref 22–32)
Calcium: 9.2 mg/dL (ref 8.9–10.3)
Chloride: 103 mmol/L (ref 98–111)
Creatinine: 1.65 mg/dL — ABNORMAL HIGH (ref 0.61–1.24)
GFR, Estimated: 43 mL/min — ABNORMAL LOW (ref >60)
Glucose, Bld: 220 mg/dL — ABNORMAL HIGH (ref 70–99)
Potassium: 4.6 mmol/L (ref 3.5–5.1)
Sodium: 135 mmol/L (ref 135–145)
Total Bilirubin: 0.4 mg/dL (ref 0.3–1.2)
Total Protein: 7.4 g/dL (ref 6.5–8.1)

## 2021-10-29 MED ORDER — SODIUM CHLORIDE 0.9% FLUSH
10.0000 mL | Freq: Once | INTRAVENOUS | Status: AC
  Administered 2021-10-29: 10 mL

## 2021-10-29 MED ORDER — SODIUM CHLORIDE 0.9 % IV SOLN
480.0000 mg | Freq: Once | INTRAVENOUS | Status: AC
  Administered 2021-10-29: 480 mg via INTRAVENOUS
  Filled 2021-10-29: qty 48

## 2021-10-29 MED ORDER — SODIUM CHLORIDE 0.9 % IV SOLN: Freq: Once | INTRAVENOUS | Status: AC

## 2021-10-29 MED ORDER — HEPARIN SOD (PORK) LOCK FLUSH 100 UNIT/ML IV SOLN
500.0000 [IU] | Freq: Once | INTRAVENOUS | Status: AC
  Administered 2021-10-29: 500 [IU]

## 2021-10-29 NOTE — Patient Instructions (Addendum)
[facility]  Discharge Instructions: Thank you for choosing [facility] to provide your oncology and hematology care.   If you have a lab appointment with the [facility], please go directly to the [facility] and check in at the registration area.   Wear comfortable clothing and clothing appropriate for easy access to any Portacath or PICC line.   We strive to give you quality time with your provider. You may need to reschedule your appointment if you arrive late (15 or more minutes).  Arriving late affects you and other patients whose appointments are after yours.  Also, if you miss three or more appointments without notifying the office, you may be dismissed from the clinic at the providers discretion.      For prescription refill requests, have your pharmacy contact our office and allow 72 hours for refills to be completed.    Today you received the following chemotherapy and/or immunotherapy agents: Nivolumab.      To help prevent nausea and vomiting after your treatment, we encourage you to take your nausea medication as directed.  BELOW ARE SYMPTOMS THAT SHOULD BE REPORTED IMMEDIATELY: *FEVER GREATER THAN 100.4 F (38 C) OR HIGHER *CHILLS OR SWEATING *NAUSEA AND VOMITING THAT IS NOT CONTROLLED WITH YOUR NAUSEA MEDICATION *UNUSUAL SHORTNESS OF BREATH *UNUSUAL BRUISING OR BLEEDING *URINARY PROBLEMS (pain or burning when urinating, or frequent urination) *BOWEL PROBLEMS (unusual diarrhea, constipation, pain near the anus) TENDERNESS IN MOUTH AND THROAT WITH OR WITHOUT PRESENCE OF ULCERS (sore throat, sores in mouth, or a toothache) UNUSUAL RASH, SWELLING OR PAIN  UNUSUAL VAGINAL DISCHARGE OR ITCHING   Items with * indicate a potential emergency and should be followed up as soon as possible or go to the Emergency Department if any problems should occur.  Please show the CHEMOTHERAPY ALERT CARD or IMMUNOTHERAPY ALERT CARD at check-in to  the Emergency Department and triage nurse.  Should you have questions after your visit or need to cancel or reschedule your appointment, please contact [facility]  Dept: [phone]  and follow the prompts.  Office hours are 8:00 a.m. to 4:30 p.m. Monday - Friday. Please note that voicemails left after 4:00 p.m. may not be returned until the following business day.  We are closed weekends and major holidays. You have access to a nurse at all times for urgent questions. Please call the main number to the clinic Dept: [phone] and follow the prompts.   For any non-urgent questions, you may also contact your provider using MyChart. We now offer e-Visits for anyone 3 and older to request care online for non-urgent symptoms. For details visit [email or web address].   Also download the MyChart app! Go to the app store, search "MyChart", open the app, select [facility], and log in with your MyChart username and password.  Due to Covid, a mask is required upon entering the hospital/clinic. If you do not have a mask, one will be given to you upon arrival. For doctor visits, patients may have 1 support person aged 28 or older with them. For treatment visits, patients cannot have anyone with them due to current Covid guidelines and our immunocompromised population.   Nivolumab injection What is this medication? NIVOLUMAB (nye VOL ue mab) is a monoclonal antibody. It treats certain types of cancer. Some of the cancers treated are colon cancer, head and neck cancer, Hodgkin lymphoma, lung cancer, and melanoma. This medicine may be used for other purposes; ask your  health care provider or pharmacist if you have questions. COMMON BRAND NAME(S): Opdivo What should I tell my care team before I take this medication? They need to know if you have any of these conditions: Autoimmune diseases such as Crohn's disease, ulcerative colitis, or lupus Have had or planning to have an  allogeneic stem cell transplant (uses someone else's stem cells) History of chest radiation Organ transplant Nervous system problems such as myasthenia gravis or Guillain-Barre syndrome An unusual or allergic reaction to nivolumab, other medicines, foods, dyes, or preservatives Pregnant or trying to get pregnant Breast-feeding How should I use this medication? This medication is injected into a vein. It is given in a hospital or clinic setting. A special MedGuide will be given to you before each treatment. Be sure to read this information carefully each time. Talk to your care team regarding the use of this medication in children. While it may be prescribed for children as young as 12 years for selected conditions, precautions do apply. Overdosage: If you think you have taken too much of this medicine contact a poison control center or emergency room at once. NOTE: This medicine is only for you. Do not share this medicine with others. What if I miss a dose? Keep appointments for follow-up doses. It is important not to miss your dose. Call your care team if you are unable to keep an appointment. What may interact with this medication? Interactions have not been studied. This list may not describe all possible interactions. Give your health care provider a list of all the medicines, herbs, non-prescription drugs, or dietary supplements you use. Also tell them if you smoke, drink alcohol, or use illegal drugs. Some items may interact with your medicine. What should I watch for while using this medication? Your condition will be monitored carefully while you are receiving this medication. You may need blood work done while you are taking this medication. Do not become pregnant while taking this medication or for 5 months after stopping it. Women should inform their care team if they wish to become pregnant or think they might be pregnant. There is a potential for serious harm to an unborn child.  Talk to your care team for more information. Do not breast-feed an infant while taking this medication or for 5 months after stopping it. What side effects may I notice from receiving this medication? Side effects that you should report to your care team as soon as possible: Allergic reactions--skin rash, itching, hives, swelling of the face, lips, tongue, or throat Bloody or black, tar-like stools Change in vision Chest pain Diarrhea Dry cough, shortness of breath or trouble breathing Eye pain Fast or irregular heartbeat Fever, chills High blood sugar (hyperglycemia)--increased thirst or amount of urine, unusual weakness or fatigue, blurry vision High thyroid levels (hyperthyroidism)--fast or irregular heartbeat, weight loss, excessive sweating or sensitivity to heat, tremors or shaking, anxiety, nervousness, irregular menstrual cycle or spotting Kidney injury--decrease in the amount of urine, swelling of the ankles, hands, or feet Liver injury--right upper belly pain, loss of appetite, nausea, light-colored stool, dark yellow or brown urine, yellowing skin or eyes, unusual weakness or fatigue Low red blood cell count--unusual weakness or fatigue, dizziness, headache, trouble breathing Low thyroid levels (hypothyroidism)--unusual weakness or fatigue, increased sensitivity to cold, constipation, hair loss, dry skin, weight gain, feelings of depression Mood and behavior changes-confusion, change in sex drive or performance, irritability Muscle pain or cramps Pain, tingling, or numbness in the hands or feet, muscle weakness, trouble  walking, loss of balance or coordination Red or dark brown urine Redness, blistering, peeling, or loosening of the skin, including inside the mouth Stomach pain Unusual bruising or bleeding Side effects that usually do not require medical attention (report to your care team if they continue or are bothersome): Bone pain Constipation Loss of  appetite Nausea Tiredness Vomiting This list may not describe all possible side effects. Call your doctor for medical advice about side effects. You may report side effects to FDA at 1-800-FDA-1088. Where should I keep my medication? This medication is given in a hospital or clinic and will not be stored at home. NOTE: This sheet is a summary. It may not cover all possible information. If you have questions about this medicine, talk to your doctor, pharmacist, or health care provider.  2022 Elsevier/Gold Standard (2021-05-21 00:00:00)

## 2021-10-29 NOTE — Progress Notes (Signed)
Per Alen Blew MD, ok to treat with SCR 1.65

## 2021-10-30 LAB — TSH: TSH: 1.161 u[IU]/mL (ref 0.320–4.118)

## 2021-12-03 DIAGNOSIS — C778 Secondary and unspecified malignant neoplasm of lymph nodes of multiple regions: Secondary | ICD-10-CM | POA: Diagnosis not present

## 2021-12-03 DIAGNOSIS — C651 Malignant neoplasm of right renal pelvis: Secondary | ICD-10-CM | POA: Diagnosis not present

## 2021-12-03 DIAGNOSIS — R338 Other retention of urine: Secondary | ICD-10-CM | POA: Diagnosis not present

## 2021-12-03 DIAGNOSIS — Z905 Acquired absence of kidney: Secondary | ICD-10-CM | POA: Diagnosis not present

## 2021-12-24 ENCOUNTER — Inpatient Hospital Stay (HOSPITAL_BASED_OUTPATIENT_CLINIC_OR_DEPARTMENT_OTHER): Payer: Medicare Other | Admitting: Oncology

## 2021-12-24 ENCOUNTER — Inpatient Hospital Stay: Payer: Medicare Other

## 2021-12-24 ENCOUNTER — Inpatient Hospital Stay: Payer: Medicare Other | Attending: Oncology

## 2021-12-24 VITALS — BP 125/67 | HR 65 | Temp 97.9°F | Resp 18 | Ht 70.0 in | Wt 192.2 lb

## 2021-12-24 DIAGNOSIS — D4959 Neoplasm of unspecified behavior of other genitourinary organ: Secondary | ICD-10-CM

## 2021-12-24 DIAGNOSIS — Z95828 Presence of other vascular implants and grafts: Secondary | ICD-10-CM

## 2021-12-24 DIAGNOSIS — C679 Malignant neoplasm of bladder, unspecified: Secondary | ICD-10-CM | POA: Insufficient documentation

## 2021-12-24 DIAGNOSIS — Z5112 Encounter for antineoplastic immunotherapy: Secondary | ICD-10-CM | POA: Diagnosis not present

## 2021-12-24 LAB — CBC WITH DIFFERENTIAL (CANCER CENTER ONLY)
Abs Immature Granulocytes: 0.01 10*3/uL (ref 0.00–0.07)
Basophils Absolute: 0 10*3/uL (ref 0.0–0.1)
Basophils Relative: 1 %
Eosinophils Absolute: 0.2 10*3/uL (ref 0.0–0.5)
Eosinophils Relative: 6 %
HCT: 34.4 % — ABNORMAL LOW (ref 39.0–52.0)
Hemoglobin: 11.8 g/dL — ABNORMAL LOW (ref 13.0–17.0)
Immature Granulocytes: 0 %
Lymphocytes Relative: 27 %
Lymphs Abs: 1.1 10*3/uL (ref 0.7–4.0)
MCH: 30.3 pg (ref 26.0–34.0)
MCHC: 34.3 g/dL (ref 30.0–36.0)
MCV: 88.2 fL (ref 80.0–100.0)
Monocytes Absolute: 0.3 10*3/uL (ref 0.1–1.0)
Monocytes Relative: 7 %
Neutro Abs: 2.4 10*3/uL (ref 1.7–7.7)
Neutrophils Relative %: 59 %
Platelet Count: 127 10*3/uL — ABNORMAL LOW (ref 150–400)
RBC: 3.9 MIL/uL — ABNORMAL LOW (ref 4.22–5.81)
RDW: 13.6 % (ref 11.5–15.5)
WBC Count: 4 10*3/uL (ref 4.0–10.5)
nRBC: 0 % (ref 0.0–0.2)

## 2021-12-24 LAB — CMP (CANCER CENTER ONLY)
ALT: 11 U/L (ref 0–44)
AST: 12 U/L — ABNORMAL LOW (ref 15–41)
Albumin: 3.8 g/dL (ref 3.5–5.0)
Alkaline Phosphatase: 66 U/L (ref 38–126)
Anion gap: 5 (ref 5–15)
BUN: 22 mg/dL (ref 8–23)
CO2: 25 mmol/L (ref 22–32)
Calcium: 8.8 mg/dL — ABNORMAL LOW (ref 8.9–10.3)
Chloride: 109 mmol/L (ref 98–111)
Creatinine: 1.14 mg/dL (ref 0.61–1.24)
GFR, Estimated: >60 mL/min (ref >60)
Glucose, Bld: 188 mg/dL — ABNORMAL HIGH (ref 70–99)
Potassium: 4.4 mmol/L (ref 3.5–5.1)
Sodium: 139 mmol/L (ref 135–145)
Total Bilirubin: 0.5 mg/dL (ref 0.3–1.2)
Total Protein: 6.6 g/dL (ref 6.5–8.1)

## 2021-12-24 MED ORDER — SODIUM CHLORIDE 0.9 % IV SOLN: Freq: Once | INTRAVENOUS | Status: AC

## 2021-12-24 MED ORDER — SODIUM CHLORIDE 0.9 % IV SOLN
480.0000 mg | Freq: Once | INTRAVENOUS | Status: AC
  Administered 2021-12-24: 480 mg via INTRAVENOUS
  Filled 2021-12-24: qty 48

## 2021-12-24 MED ORDER — SODIUM CHLORIDE 0.9% FLUSH
10.0000 mL | Freq: Once | INTRAVENOUS | Status: AC
  Administered 2021-12-24: 10 mL

## 2021-12-24 MED ORDER — TRIAMCINOLONE ACETONIDE 0.1 % EX CREA: 1.0000 "application " | TOPICAL_CREAM | Freq: Two times a day (BID) | CUTANEOUS | 3 refills | Status: DC

## 2021-12-24 MED ORDER — HYDROXYZINE HCL 10 MG PO TABS: 10.0000 mg | ORAL_TABLET | Freq: Three times a day (TID) | ORAL | 0 refills | Status: DC | PRN

## 2021-12-24 MED ORDER — SODIUM CHLORIDE 0.9% FLUSH
10.0000 mL | INTRAVENOUS | Status: DC | PRN
  Administered 2021-12-24: 10 mL

## 2021-12-24 MED ORDER — HEPARIN SOD (PORK) LOCK FLUSH 100 UNIT/ML IV SOLN
500.0000 [IU] | Freq: Once | INTRAVENOUS | Status: AC | PRN
  Administered 2021-12-24: 500 [IU]

## 2021-12-24 NOTE — Patient Instructions (Signed)
[facility]  Discharge Instructions: ?Thank you for choosing [facility] to provide your oncology and hematology care.  ? ?If you have a lab appointment with the [facility], please go directly to the [facility] and check in at the registration area. ?  ?Wear comfortable clothing and clothing appropriate for easy access to any Portacath or PICC line.  ? ?We strive to give you quality time with your provider. You may need to reschedule your appointment if you arrive late (15 or more minutes).  Arriving late affects you and other patients whose appointments are after yours.  Also, if you miss three or more appointments without notifying the office, you may be dismissed from the clinic at the provider?s discretion.    ?  ?For prescription refill requests, have your pharmacy contact our office and allow 72 hours for refills to be completed.   ? ?Today you received the following chemotherapy and/or immunotherapy agents: Nivolumab.    ?  ?To help prevent nausea and vomiting after your treatment, we encourage you to take your nausea medication as directed. ? ?BELOW ARE SYMPTOMS THAT SHOULD BE REPORTED IMMEDIATELY: ?*FEVER GREATER THAN 100.4 F (38 ?C) OR HIGHER ?*CHILLS OR SWEATING ?*NAUSEA AND VOMITING THAT IS NOT CONTROLLED WITH YOUR NAUSEA MEDICATION ?*UNUSUAL SHORTNESS OF BREATH ?*UNUSUAL BRUISING OR BLEEDING ?*URINARY PROBLEMS (pain or burning when urinating, or frequent urination) ?*BOWEL PROBLEMS (unusual diarrhea, constipation, pain near the anus) ?TENDERNESS IN MOUTH AND THROAT WITH OR WITHOUT PRESENCE OF ULCERS (sore throat, sores in mouth, or a toothache) ?UNUSUAL RASH, SWELLING OR PAIN  ?UNUSUAL VAGINAL DISCHARGE OR ITCHING  ? ?Items with * indicate a potential emergency and should be followed up as soon as possible or go to the Emergency Department if any problems should occur. ? ?Please show the CHEMOTHERAPY ALERT CARD or IMMUNOTHERAPY ALERT CARD at check-in to  the Emergency Department and triage nurse. ? ?Should you have questions after your visit or need to cancel or reschedule your appointment, please contact [facility]  Dept: [phone]  and follow the prompts.  Office hours are 8:00 a.m. to 4:30 p.m. Monday - Friday. Please note that voicemails left after 4:00 p.m. may not be returned until the following business day.  We are closed weekends and major holidays. You have access to a nurse at all times for urgent questions. Please call the main number to the clinic Dept: [phone] and follow the prompts. ? ? ?For any non-urgent questions, you may also contact your provider using MyChart. We now offer e-Visits for anyone 61 and older to request care online for non-urgent symptoms. For details visit [email or web address]. ?  ?Also download the MyChart app! Go to the app store, search "MyChart", open the app, select [facility], and log in with your MyChart username and password. ? ?Due to Covid, a mask is required upon entering the hospital/clinic. If you do not have a mask, one will be given to you upon arrival. For doctor visits, patients may have 1 support person aged 30 or older with them. For treatment visits, patients cannot have anyone with them due to current Covid guidelines and our immunocompromised population.  ? ?Nivolumab injection ?What is this medication? ?NIVOLUMAB (nye VOL ue mab) is a monoclonal antibody. It treats certain types of cancer. Some of the cancers treated are colon cancer, head and neck cancer, Hodgkin lymphoma, lung cancer, and melanoma. ?This medicine may be used for other purposes; ask your  health care provider or pharmacist if you have questions. ?COMMON BRAND NAME(S): Opdivo ?What should I tell my care team before I take this medication? ?They need to know if you have any of these conditions: ?Autoimmune diseases such as Crohn's disease, ulcerative colitis, or lupus ?Have had or planning to have an  allogeneic stem cell transplant (uses someone else's stem cells) ?History of chest radiation ?Organ transplant ?Nervous system problems such as myasthenia gravis or Guillain-Barre syndrome ?An unusual or allergic reaction to nivolumab, other medicines, foods, dyes, or preservatives ?Pregnant or trying to get pregnant ?Breast-feeding ?How should I use this medication? ?This medication is injected into a vein. It is given in a hospital or clinic setting. ?A special MedGuide will be given to you before each treatment. Be sure to read this information carefully each time. ?Talk to your care team regarding the use of this medication in children. While it may be prescribed for children as young as 12 years for selected conditions, precautions do apply. ?Overdosage: If you think you have taken too much of this medicine contact a poison control center or emergency room at once. ?NOTE: This medicine is only for you. Do not share this medicine with others. ?What if I miss a dose? ?Keep appointments for follow-up doses. It is important not to miss your dose. Call your care team if you are unable to keep an appointment. ?What may interact with this medication? ?Interactions have not been studied. ?This list may not describe all possible interactions. Give your health care provider a list of all the medicines, herbs, non-prescription drugs, or dietary supplements you use. Also tell them if you smoke, drink alcohol, or use illegal drugs. Some items may interact with your medicine. ?What should I watch for while using this medication? ?Your condition will be monitored carefully while you are receiving this medication. ?You may need blood work done while you are taking this medication. ?Do not become pregnant while taking this medication or for 5 months after stopping it. Women should inform their care team if they wish to become pregnant or think they might be pregnant. There is a potential for serious harm to an unborn child.  Talk to your care team for more information. Do not breast-feed an infant while taking this medication or for 5 months after stopping it. ?What side effects may I notice from receiving this medication? ?Side effects that you should report to your care team as soon as possible: ?Allergic reactions--skin rash, itching, hives, swelling of the face, lips, tongue, or throat ?Bloody or black, tar-like stools ?Change in vision ?Chest pain ?Diarrhea ?Dry cough, shortness of breath or trouble breathing ?Eye pain ?Fast or irregular heartbeat ?Fever, chills ?High blood sugar (hyperglycemia)--increased thirst or amount of urine, unusual weakness or fatigue, blurry vision ?High thyroid levels (hyperthyroidism)--fast or irregular heartbeat, weight loss, excessive sweating or sensitivity to heat, tremors or shaking, anxiety, nervousness, irregular menstrual cycle or spotting ?Kidney injury--decrease in the amount of urine, swelling of the ankles, hands, or feet ?Liver injury--right upper belly pain, loss of appetite, nausea, light-colored stool, dark yellow or brown urine, yellowing skin or eyes, unusual weakness or fatigue ?Low red blood cell count--unusual weakness or fatigue, dizziness, headache, trouble breathing ?Low thyroid levels (hypothyroidism)--unusual weakness or fatigue, increased sensitivity to cold, constipation, hair loss, dry skin, weight gain, feelings of depression ?Mood and behavior changes-confusion, change in sex drive or performance, irritability ?Muscle pain or cramps ?Pain, tingling, or numbness in the hands or feet, muscle weakness, trouble  walking, loss of balance or coordination ?Red or dark brown urine ?Redness, blistering, peeling, or loosening of the skin, including inside the mouth ?Stomach pain ?Unusual bruising or bleeding ?Side effects that usually do not require medical attention (report to your care team if they continue or are bothersome): ?Bone pain ?Constipation ?Loss of  appetite ?Nausea ?Tiredness ?Vomiting ?This list may not describe all possible side effects. Call your doctor for medical advice about side effects. You may report side effects to FDA at 1-800-FDA-1088. ?Where should I keep

## 2021-12-24 NOTE — Progress Notes (Signed)
Hematology and Oncology Follow Up Visit ? ?Travaughn Vue ?[id number] ?1946-04-07 76 y.o. ?12/24/2021 8:47 AM ?[name], MDBurdine, [name], MD  ? ?Principle Diagnosis: 76 year old man with ureteral cancer diagnosed in 2021.  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 ? ?Prior Therapy: ? ?He underwent robotic assisted laparoscopic  nephroureterectomy completed on Feb 10, 2020.  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  He was found to have metastatic pericaval lymph node as well as a right common iliac lymph node. ? ?Carboplatin and gemcitabine started on July 04, 2020.  He completed 6 cycles of therapy on October 17, 2020. ? ?Current therapy: Nivolumab 480 mg every 4 weeks started on October 29, 2021.  He is here for cycle 2 of therapy. ? ? ?Interim History: Mr. Hector Lopez returns today for a follow-up visit.  Since the last visit, he received the first cycle of nivolumab without any complications.  He has reported occasional pruritus there is manageable with Benadryl although he has noticed faint maculopapular rash on his chest wall.  He denies any nausea, vomiting or abdominal pain.  Denies any diarrhea or excessive fatigue.  He denies any dyspnea on exertion. ? ? ? ?Medications: Updated on review. ?Current Outpatient Medications  ?Medication Sig Dispense Refill  ? amLODipine (NORVASC) 10 MG tablet Take 10 mg by mouth at bedtime.     ? cetirizine (ZYRTEC) 10 MG tablet Take 10 mg by mouth daily.    ? finasteride (PROSCAR) 5 MG tablet Take 5 mg by mouth daily.    ? glipiZIDE (GLUCOTROL) 10 MG tablet Take 10 mg by mouth daily before breakfast.     ? lidocaine-prilocaine (EMLA) cream Apply 1 application topically as needed. 30 g 0  ? lisinopril (ZESTRIL) 40 MG tablet Take 40 mg by mouth at bedtime.     ? metFORMIN (GLUCOPHAGE) 1000 MG tablet Take 1,000 mg by mouth 2 (two) times  daily with a meal.     ? omeprazole (PRILOSEC) 40 MG capsule Take 40 mg by mouth daily.    ? prochlorperazine (COMPAZINE) 10 MG tablet Take 1 tablet (10 mg total) by mouth every 6 (six) hours as needed for nausea or vomiting. 30 tablet 0  ? senna-docusate (SENOKOT-S) 8.6-50 MG tablet Take 1 tablet by mouth 2 (two) times daily. While taking strong pain meds to prevent cosntipation. 10 tablet 0  ? sertraline (ZOLOFT) 50 MG tablet Take 50 mg by mouth daily.    ? simvastatin (ZOCOR) 20 MG tablet Take 20 mg by mouth daily.     ? sulfamethoxazole-trimethoprim (BACTRIM DS) 800-160 MG tablet Take 1 tablet by mouth daily. Start taking one day prior to your appointment for your first follow-up and catheter removal.  Continue taking for three days. (Patient not taking: Reported on 06/20/2020) 3 tablet 0  ? ?No current facility-administered medications for this visit.  ? ? ? ?Allergies:  ?Allergies  ?Allergen Reactions  ? Codeine Nausea And Vomiting  ? ? ? ? ?Physical Exam: ? ? ? ? ?Blood pressure 125/67, pulse 65, temperature 97.9 ?F (36.6 ?C), temperature source Temporal, resp. rate 18, height '5\' 10"'$  (1.778 m), weight 192 lb 3.2 oz (87.2 kg), SpO2 98 %. ? ? ? ? ?ECOG: 1 ? ? ?General appearance: Comfortable appearing without any discomfort ?Head: Normocephalic without any trauma ?Oropharynx: Mucous membranes are moist and pink without any thrush or ulcers. ?Eyes: Pupils are equal and round  reactive to light. ?Lymph nodes: No cervical, supraclavicular, inguinal or axillary lymphadenopathy.   ?Heart:regular rate and rhythm.  S1 and S2 without leg edema. ?Lung: Clear without any rhonchi or wheezes.  No dullness to percussion. ?Abdomin: Soft, nontender, nondistended with good bowel sounds.  No hepatosplenomegaly. ?Musculoskeletal: No joint deformity or effusion.  Full range of motion noted. ?Neurological: No deficits noted on motor, sensory and deep tendon reflex exam. ?Skin: Faint erythematous rash on the left side of his chest  wall. ? ? ? ? ? ? ? ?  ? ? ? ? ? ? ?Lab Results: ?Lab Results  ?Component Value Date  ? WBC 5.0 10/29/2021  ? HGB 12.7 (L) 10/29/2021  ? HCT 36.7 (L) 10/29/2021  ? MCV 86.6 10/29/2021  ? PLT 151 10/29/2021  ? ?  Chemistry   ?   ?Component Value Date/Time  ? NA 135 10/29/2021 1448  ? K 4.6 10/29/2021 1448  ? CL 103 10/29/2021 1448  ? CO2 25 10/29/2021 1448  ? BUN 24 (H) 10/29/2021 1448  ? CREATININE 1.65 (H) 10/29/2021 1448  ?    ?Component Value Date/Time  ? CALCIUM 9.2 10/29/2021 1448  ? ALKPHOS 61 10/29/2021 1448  ? AST 18 10/29/2021 1448  ? ALT 14 10/29/2021 1448  ? BILITOT 0.4 10/29/2021 1448  ?  ? ? ? ?  ? ?Impression and Plan: ? ?76 year old man with: ?  ?1.  Ureteral cancer diagnosed in 2021.  He developed stage IV high-grade urothelial tumor with pelvic adenopathy. ? ?The natural course of his disease was reviewed at this time and treatment choices were discussed.  He continues to tolerate nivolumab without any complications at this time.  These complications include autoimmune issues, GI toxicity among others were reviewed.  Alternative treatment options including antibody drug conjugate oral targeted therapy.   ? ?The plan is to continue with this therapy and update his staging scans in 3 months. ? ?2.  IV access: Port-A-Cath continues to be accessed and in use without any issues. ? ?3.  Immune mediated issues: He is complication clinic pneumonitis, colitis and thyroid disease were reviewed.  He is experiencing a mild dermatitis which we will treat with topical steroids and antihistamines.  Prescription for hydroxyzine and prednisolone will be available to him. ? ?4.  Antiemetics: Compazine is available to him without any issues. ?  ? 5.  Follow-up: In 4 weeks for the next cycle of therapy. ?  ?30  minutes were spent on this visit.  The time was dedicated to reviewing laboratory data, disease status update and outlining future plan of care discussion. ?  ?  ? ?Zola Button, MD ?4/11/20238:47 AM ? ?

## 2021-12-27 ENCOUNTER — Telehealth: Payer: Self-pay | Admitting: Oncology

## 2021-12-27 NOTE — Telephone Encounter (Signed)
Called patient regarding upcoming appointment, patient is notified. °

## 2022-01-22 ENCOUNTER — Inpatient Hospital Stay: Payer: Medicare Other

## 2022-01-22 ENCOUNTER — Inpatient Hospital Stay (HOSPITAL_BASED_OUTPATIENT_CLINIC_OR_DEPARTMENT_OTHER): Payer: Medicare Other | Admitting: Oncology

## 2022-01-22 ENCOUNTER — Inpatient Hospital Stay: Payer: Medicare Other | Attending: Oncology

## 2022-01-22 ENCOUNTER — Other Ambulatory Visit: Payer: Self-pay

## 2022-01-22 VITALS — BP 129/65 | HR 77 | Temp 97.9°F | Resp 17 | Ht 70.0 in | Wt 190.6 lb

## 2022-01-22 DIAGNOSIS — D4959 Neoplasm of unspecified behavior of other genitourinary organ: Secondary | ICD-10-CM

## 2022-01-22 DIAGNOSIS — Z95828 Presence of other vascular implants and grafts: Secondary | ICD-10-CM

## 2022-01-22 DIAGNOSIS — C679 Malignant neoplasm of bladder, unspecified: Secondary | ICD-10-CM | POA: Diagnosis not present

## 2022-01-22 DIAGNOSIS — R590 Localized enlarged lymph nodes: Secondary | ICD-10-CM | POA: Diagnosis not present

## 2022-01-22 DIAGNOSIS — Z5112 Encounter for antineoplastic immunotherapy: Secondary | ICD-10-CM | POA: Insufficient documentation

## 2022-01-22 DIAGNOSIS — K5900 Constipation, unspecified: Secondary | ICD-10-CM | POA: Insufficient documentation

## 2022-01-22 LAB — CMP (CANCER CENTER ONLY)
ALT: 9 U/L (ref 0–44)
AST: 10 U/L — ABNORMAL LOW (ref 15–41)
Albumin: 3.8 g/dL (ref 3.5–5.0)
Alkaline Phosphatase: 74 U/L (ref 38–126)
Anion gap: 4 — ABNORMAL LOW (ref 5–15)
BUN: 26 mg/dL — ABNORMAL HIGH (ref 8–23)
CO2: 26 mmol/L (ref 22–32)
Calcium: 8.9 mg/dL (ref 8.9–10.3)
Chloride: 102 mmol/L (ref 98–111)
Creatinine: 1.38 mg/dL — ABNORMAL HIGH (ref 0.61–1.24)
GFR, Estimated: 53 mL/min — ABNORMAL LOW (ref >60)
Glucose, Bld: 439 mg/dL — ABNORMAL HIGH (ref 70–99)
Potassium: 5 mmol/L (ref 3.5–5.1)
Sodium: 132 mmol/L — ABNORMAL LOW (ref 135–145)
Total Bilirubin: 0.6 mg/dL (ref 0.3–1.2)
Total Protein: 7.2 g/dL (ref 6.5–8.1)

## 2022-01-22 LAB — CBC WITH DIFFERENTIAL (CANCER CENTER ONLY)
Abs Immature Granulocytes: 0.01 10*3/uL (ref 0.00–0.07)
Basophils Absolute: 0 10*3/uL (ref 0.0–0.1)
Basophils Relative: 1 %
Eosinophils Absolute: 0.3 10*3/uL (ref 0.0–0.5)
Eosinophils Relative: 5 %
HCT: 35.9 % — ABNORMAL LOW (ref 39.0–52.0)
Hemoglobin: 12.4 g/dL — ABNORMAL LOW (ref 13.0–17.0)
Immature Granulocytes: 0 %
Lymphocytes Relative: 20 %
Lymphs Abs: 1 10*3/uL (ref 0.7–4.0)
MCH: 29.8 pg (ref 26.0–34.0)
MCHC: 34.5 g/dL (ref 30.0–36.0)
MCV: 86.3 fL (ref 80.0–100.0)
Monocytes Absolute: 0.3 10*3/uL (ref 0.1–1.0)
Monocytes Relative: 7 %
Neutro Abs: 3.3 10*3/uL (ref 1.7–7.7)
Neutrophils Relative %: 67 %
Platelet Count: 145 10*3/uL — ABNORMAL LOW (ref 150–400)
RBC: 4.16 MIL/uL — ABNORMAL LOW (ref 4.22–5.81)
RDW: 13.2 % (ref 11.5–15.5)
WBC Count: 4.9 10*3/uL (ref 4.0–10.5)
nRBC: 0 % (ref 0.0–0.2)

## 2022-01-22 MED ORDER — SODIUM CHLORIDE 0.9% FLUSH
10.0000 mL | Freq: Once | INTRAVENOUS | Status: AC
  Administered 2022-01-22: 10 mL

## 2022-01-22 MED ORDER — HEPARIN SOD (PORK) LOCK FLUSH 100 UNIT/ML IV SOLN
500.0000 [IU] | Freq: Once | INTRAVENOUS | Status: AC | PRN
  Administered 2022-01-22: 500 [IU]

## 2022-01-22 MED ORDER — SODIUM CHLORIDE 0.9% FLUSH
10.0000 mL | INTRAVENOUS | Status: DC | PRN
  Administered 2022-01-22: 10 mL

## 2022-01-22 MED ORDER — SODIUM CHLORIDE 0.9 % IV SOLN: Freq: Once | INTRAVENOUS | Status: AC

## 2022-01-22 MED ORDER — SODIUM CHLORIDE 0.9 % IV SOLN
480.0000 mg | Freq: Once | INTRAVENOUS | Status: AC
  Administered 2022-01-22: 480 mg via INTRAVENOUS
  Filled 2022-01-22: qty 48

## 2022-01-22 NOTE — Progress Notes (Signed)
Hematology and Oncology Follow Up Visit ? ?Hector Lopez ?[id number] ?10/12/1945 76 y.o. ?01/22/2022 11:49 AM ?[name], MDBurdine, [name], MD  ? 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 ?Prior Therapy: ? ?He underwent robotic assisted laparoscopic  nephroureterectomy completed on Feb 10, 2020.  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  He was found to have metastatic pericaval lymph node as well as a right common iliac lymph node. ? ?Carboplatin and gemcitabine started on July 04, 2020.  He completed 6 cycles of therapy on October 17, 2020. ? ?Current therapy: Nivolumab 480 mg every 4 weeks started on October 29, 2021.  He is here for cycle 3 of therapy. ? ? ?Interim History: Hector Lopez returns today for a follow-up visit.  Since last visit, he reports no major changes in his health.  He continues to tolerate current therapy without complaints.  He denies any nausea, vomiting or abdominal pain.  He denies any diarrhea but has reported some constipation.  He has not had a bowel over the last 3 days with a lower abdomen discomfort.  He denies any skin rashes or lesions.  His performance status quality of life remains unchanged. ? ? ? ?Medications: Reviewed without changes. ?Current Outpatient Medications  ?Medication Sig Dispense Refill  ? amLODipine (NORVASC) 10 MG tablet Take 10 mg by mouth at bedtime.     ? cetirizine (ZYRTEC) 10 MG tablet Take 10 mg by mouth daily.    ? finasteride (PROSCAR) 5 MG tablet Take 5 mg by mouth daily.    ? glipiZIDE (GLUCOTROL) 10 MG tablet Take 10 mg by mouth daily before breakfast.     ? hydrOXYzine (ATARAX) 10 MG tablet Take 1 tablet (10 mg total) by mouth 3 (three) times daily as needed. 30 tablet 0  ? lidocaine-prilocaine (EMLA) cream Apply 1 application topically as needed. 30 g 0  ? lisinopril (ZESTRIL) 40 MG  tablet Take 40 mg by mouth at bedtime.     ? metFORMIN (GLUCOPHAGE) 1000 MG tablet Take 1,000 mg by mouth 2 (two) times daily with a meal.     ? omeprazole (PRILOSEC) 40 MG capsule Take 40 mg by mouth daily.    ? prochlorperazine (COMPAZINE) 10 MG tablet Take 1 tablet (10 mg total) by mouth every 6 (six) hours as needed for nausea or vomiting. 30 tablet 0  ? senna-docusate (SENOKOT-S) 8.6-50 MG tablet Take 1 tablet by mouth 2 (two) times daily. While taking strong pain meds to prevent cosntipation. 10 tablet 0  ? sertraline (ZOLOFT) 50 MG tablet Take 50 mg by mouth daily.    ? simvastatin (ZOCOR) 20 MG tablet Take 20 mg by mouth daily.     ? sulfamethoxazole-trimethoprim (BACTRIM DS) 800-160 MG tablet Take 1 tablet by mouth daily. Start taking one day prior to your appointment for your first follow-up and catheter removal.  Continue taking for three days. (Patient not taking: Reported on 06/20/2020) 3 tablet 0  ? triamcinolone cream (KENALOG) 0.1 % Apply 1 application. topically 2 (two) times daily. 45 g 3  ? ?No current facility-administered medications for this visit.  ? ? ? ?Allergies:  ?Allergies  ?Allergen Reactions  ? Codeine Nausea And Vomiting  ? ? ? ? ?Physical Exam: ? ? ?Blood pressure 129/65, pulse 77, temperature 97.9 ?F (36.6 ?C), temperature source Axillary, resp. rate 17, height '5\' 10"'$  (1.778 m), weight 190  lb 9.6 oz (86.5 kg), SpO2 100 %. ?6 ? ? ? ? ? ? ?ECOG: 1 ? ? ?General appearance: Alert, awake without any distress. ?Head: Atraumatic without abnormalities ?Oropharynx: Without any thrush or ulcers. ?Eyes: No scleral icterus. ?Lymph nodes: No lymphadenopathy noted in the cervical, supraclavicular, or axillary nodes ?Heart:regular rate and rhythm, without any murmurs or gallops.   ?Lung: Clear to auscultation without any rhonchi, wheezes or dullness to percussion. ?Abdomin: Soft, nontender without any shifting dullness or ascites. ?Musculoskeletal: No clubbing or cyanosis. ?Neurological: No motor or  sensory deficits. ?Skin: No rashes or lesions. ? ? ? ? ? ? ? ? ?  ? ? ? ? ? ? ?Lab Results: ?Lab Results  ?Component Value Date  ? WBC 4.0 12/24/2021  ? HGB 11.8 (L) 12/24/2021  ? HCT 34.4 (L) 12/24/2021  ? MCV 88.2 12/24/2021  ? PLT 127 (L) 12/24/2021  ? ?  Chemistry   ?   ?Component Value Date/Time  ? NA 139 12/24/2021 0831  ? K 4.4 12/24/2021 0831  ? CL 109 12/24/2021 0831  ? CO2 25 12/24/2021 0831  ? BUN 22 12/24/2021 0831  ? CREATININE 1.14 12/24/2021 0831  ?    ?Component Value Date/Time  ? CALCIUM 8.8 (L) 12/24/2021 0831  ? ALKPHOS 66 12/24/2021 0831  ? AST 12 (L) 12/24/2021 0831  ? ALT 11 12/24/2021 0831  ? BILITOT 0.5 12/24/2021 0831  ?  ? ? ? ?  ? ?Impression and Plan: ? ?76 year old man with: ?  ?1. Stage IV high-grade urothelial of the ureter with pelvic adenopathy noted in 2023 after initial diagnosis in 2021. ? ?He is currently on nivolumab which she has tolerated very well.  I recommended continuing the same dose and schedule at this time and update his staging scans before the next treatment.  Complication associated with this treatment were reiterated today.  These include GI toxicity, dermatological toxicity as well as autoimmune considerations.  Alternative treatment options with an oral targeted therapy as well as antibody drug conjugate. ? ?2.  IV access: Port-A-Cath remains in use without any issues. ? ?3.  Immune mediated issues: I continue to educate him about these complications including hepatitis, dermatitis, hypophysitis as well as pneumonitis.  He has not experienced any at this time and I recommended continued monitoring and education. ? ?4.  Antiemetics: No nausea or vomiting reported at this time. ? ?5.  Constipation: We have discussed strategies to improve his bowel habits including increasing hydration, fiber as well as daily MiraLAX. ?  ? 6.  Follow-up: He will return in 1 month for the next cycle of therapy. ?  ?30  minutes were dedicated to this encounter.  The time was spent on  reviewing laboratory data, disease status update and future plan of care discussion. ?  ?  ? ?Zola Button, MD ?5/10/202311:49 AM ? ?

## 2022-01-22 NOTE — Patient Instructions (Signed)
[facility]  Discharge Instructions: ?Thank you for choosing [facility] to provide your oncology and hematology care.  ? ?If you have a lab appointment with the [facility], please go directly to the [facility] and check in at the registration area. ?  ?Wear comfortable clothing and clothing appropriate for easy access to any Portacath or PICC line.  ? ?We strive to give you quality time with your provider. You may need to reschedule your appointment if you arrive late (15 or more minutes).  Arriving late affects you and other patients whose appointments are after yours.  Also, if you miss three or more appointments without notifying the office, you may be dismissed from the clinic at the provider?s discretion.    ?  ?For prescription refill requests, have your pharmacy contact our office and allow 72 hours for refills to be completed.   ? ?Today you received the following chemotherapy and/or immunotherapy agents: Nivolumab.    ?  ?To help prevent nausea and vomiting after your treatment, we encourage you to take your nausea medication as directed. ? ?BELOW ARE SYMPTOMS THAT SHOULD BE REPORTED IMMEDIATELY: ?*FEVER GREATER THAN 100.4 F (38 ?C) OR HIGHER ?*CHILLS OR SWEATING ?*NAUSEA AND VOMITING THAT IS NOT CONTROLLED WITH YOUR NAUSEA MEDICATION ?*UNUSUAL SHORTNESS OF BREATH ?*UNUSUAL BRUISING OR BLEEDING ?*URINARY PROBLEMS (pain or burning when urinating, or frequent urination) ?*BOWEL PROBLEMS (unusual diarrhea, constipation, pain near the anus) ?TENDERNESS IN MOUTH AND THROAT WITH OR WITHOUT PRESENCE OF ULCERS (sore throat, sores in mouth, or a toothache) ?UNUSUAL RASH, SWELLING OR PAIN  ?UNUSUAL VAGINAL DISCHARGE OR ITCHING  ? ?Items with * indicate a potential emergency and should be followed up as soon as possible or go to the Emergency Department if any problems should occur. ? ?Please show the CHEMOTHERAPY ALERT CARD or IMMUNOTHERAPY ALERT CARD at check-in to  the Emergency Department and triage nurse. ? ?Should you have questions after your visit or need to cancel or reschedule your appointment, please contact [facility]  Dept: [phone]  and follow the prompts.  Office hours are 8:00 a.m. to 4:30 p.m. Monday - Friday. Please note that voicemails left after 4:00 p.m. may not be returned until the following business day.  We are closed weekends and major holidays. You have access to a nurse at all times for urgent questions. Please call the main number to the clinic Dept: [phone] and follow the prompts. ? ? ?For any non-urgent questions, you may also contact your provider using MyChart. We now offer e-Visits for anyone 71 and older to request care online for non-urgent symptoms. For details visit [email or web address]. ?  ?Also download the MyChart app! Go to the app store, search "MyChart", open the app, select [facility], and log in with your MyChart username and password. ? ?Due to Covid, a mask is required upon entering the hospital/clinic. If you do not have a mask, one will be given to you upon arrival. For doctor visits, patients may have 1 support person aged 51 or older with them. For treatment visits, patients cannot have anyone with them due to current Covid guidelines and our immunocompromised population.  ? ?Nivolumab injection ?What is this medication? ?NIVOLUMAB (nye VOL ue mab) is a monoclonal antibody. It treats certain types of cancer. Some of the cancers treated are colon cancer, head and neck cancer, Hodgkin lymphoma, lung cancer, and melanoma. ?This medicine may be used for other purposes; ask your  health care provider or pharmacist if you have questions. ?COMMON BRAND NAME(S): Opdivo ?What should I tell my care team before I take this medication? ?They need to know if you have any of these conditions: ?Autoimmune diseases such as Crohn's disease, ulcerative colitis, or lupus ?Have had or planning to have an  allogeneic stem cell transplant (uses someone else's stem cells) ?History of chest radiation ?Organ transplant ?Nervous system problems such as myasthenia gravis or Guillain-Barre syndrome ?An unusual or allergic reaction to nivolumab, other medicines, foods, dyes, or preservatives ?Pregnant or trying to get pregnant ?Breast-feeding ?How should I use this medication? ?This medication is injected into a vein. It is given in a hospital or clinic setting. ?A special MedGuide will be given to you before each treatment. Be sure to read this information carefully each time. ?Talk to your care team regarding the use of this medication in children. While it may be prescribed for children as young as 12 years for selected conditions, precautions do apply. ?Overdosage: If you think you have taken too much of this medicine contact a poison control center or emergency room at once. ?NOTE: This medicine is only for you. Do not share this medicine with others. ?What if I miss a dose? ?Keep appointments for follow-up doses. It is important not to miss your dose. Call your care team if you are unable to keep an appointment. ?What may interact with this medication? ?Interactions have not been studied. ?This list may not describe all possible interactions. Give your health care provider a list of all the medicines, herbs, non-prescription drugs, or dietary supplements you use. Also tell them if you smoke, drink alcohol, or use illegal drugs. Some items may interact with your medicine. ?What should I watch for while using this medication? ?Your condition will be monitored carefully while you are receiving this medication. ?You may need blood work done while you are taking this medication. ?Do not become pregnant while taking this medication or for 5 months after stopping it. Women should inform their care team if they wish to become pregnant or think they might be pregnant. There is a potential for serious harm to an unborn child.  Talk to your care team for more information. Do not breast-feed an infant while taking this medication or for 5 months after stopping it. ?What side effects may I notice from receiving this medication? ?Side effects that you should report to your care team as soon as possible: ?Allergic reactions--skin rash, itching, hives, swelling of the face, lips, tongue, or throat ?Bloody or black, tar-like stools ?Change in vision ?Chest pain ?Diarrhea ?Dry cough, shortness of breath or trouble breathing ?Eye pain ?Fast or irregular heartbeat ?Fever, chills ?High blood sugar (hyperglycemia)--increased thirst or amount of urine, unusual weakness or fatigue, blurry vision ?High thyroid levels (hyperthyroidism)--fast or irregular heartbeat, weight loss, excessive sweating or sensitivity to heat, tremors or shaking, anxiety, nervousness, irregular menstrual cycle or spotting ?Kidney injury--decrease in the amount of urine, swelling of the ankles, hands, or feet ?Liver injury--right upper belly pain, loss of appetite, nausea, light-colored stool, dark yellow or brown urine, yellowing skin or eyes, unusual weakness or fatigue ?Low red blood cell count--unusual weakness or fatigue, dizziness, headache, trouble breathing ?Low thyroid levels (hypothyroidism)--unusual weakness or fatigue, increased sensitivity to cold, constipation, hair loss, dry skin, weight gain, feelings of depression ?Mood and behavior changes-confusion, change in sex drive or performance, irritability ?Muscle pain or cramps ?Pain, tingling, or numbness in the hands or feet, muscle weakness, trouble  walking, loss of balance or coordination ?Red or dark brown urine ?Redness, blistering, peeling, or loosening of the skin, including inside the mouth ?Stomach pain ?Unusual bruising or bleeding ?Side effects that usually do not require medical attention (report to your care team if they continue or are bothersome): ?Bone pain ?Constipation ?Loss of  appetite ?Nausea ?Tiredness ?Vomiting ?This list may not describe all possible side effects. Call your doctor for medical advice about side effects. You may report side effects to FDA at 1-800-FDA-1088. ?Where should I keep

## 2022-02-07 ENCOUNTER — Encounter (HOSPITAL_COMMUNITY): Payer: Self-pay

## 2022-02-07 ENCOUNTER — Ambulatory Visit (HOSPITAL_COMMUNITY)
Admission: RE | Admit: 2022-02-07 | Discharge: 2022-02-07 | Disposition: A | Discharge status: Home / Self Care | Payer: Medicare Other | Source: Ambulatory Visit | Attending: Oncology | Admitting: Oncology

## 2022-02-07 DIAGNOSIS — K8020 Calculus of gallbladder without cholecystitis without obstruction: Secondary | ICD-10-CM | POA: Diagnosis not present

## 2022-02-07 DIAGNOSIS — I700 Atherosclerosis of aorta: Secondary | ICD-10-CM | POA: Diagnosis not present

## 2022-02-07 DIAGNOSIS — C679 Malignant neoplasm of bladder, unspecified: Secondary | ICD-10-CM | POA: Diagnosis not present

## 2022-02-07 DIAGNOSIS — K4090 Unilateral inguinal hernia, without obstruction or gangrene, not specified as recurrent: Secondary | ICD-10-CM | POA: Diagnosis not present

## 2022-02-07 DIAGNOSIS — D4959 Neoplasm of unspecified behavior of other genitourinary organ: Secondary | ICD-10-CM | POA: Insufficient documentation

## 2022-02-07 DIAGNOSIS — K861 Other chronic pancreatitis: Secondary | ICD-10-CM | POA: Diagnosis not present

## 2022-02-07 IMAGING — CT CT ABD-PELV W/O CM
2 of 4 series · 13 of 36 positions shown, 16 images · non-contrast
Comparison: [DATE]

CLINICAL DATA: Metastatic high-grade urothelial carcinoma, pelvic
adenopathy, status post right nephrectomy, ongoing immunotherapy *
Tracking Code: BO *



[Series 2: cap w/o · axial · non-contrast · 0.84mm/px · z∈[-613,-48]mm · 10 of 137 slices shown, 13 images]
[im 12/137  mediastinal]
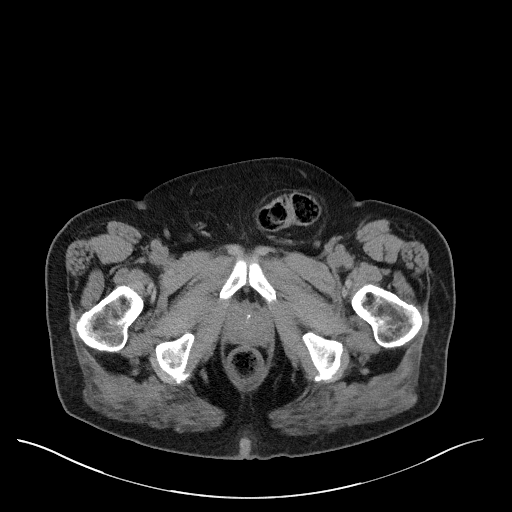
[im 12/137  lung]
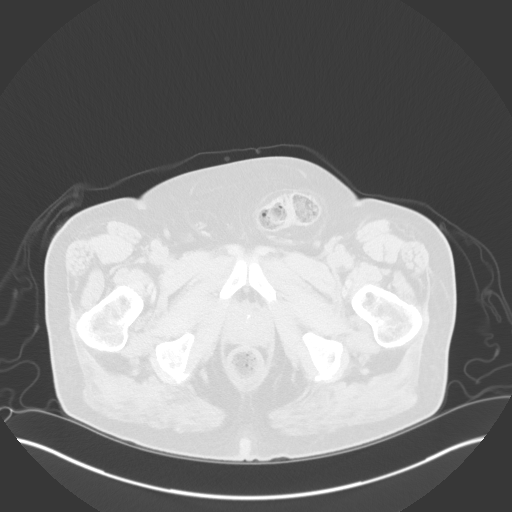
[im 23/137  lung]
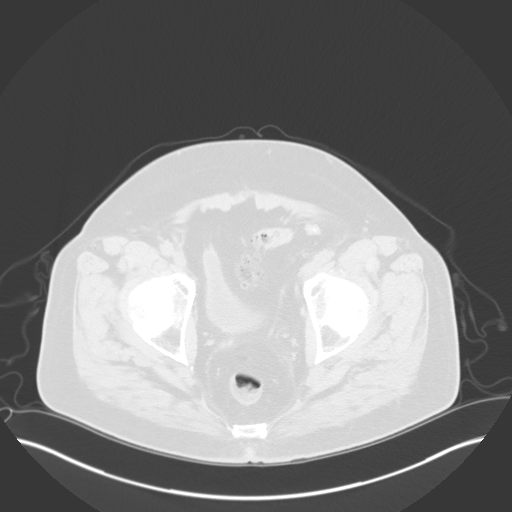
[im 35/137  lung]
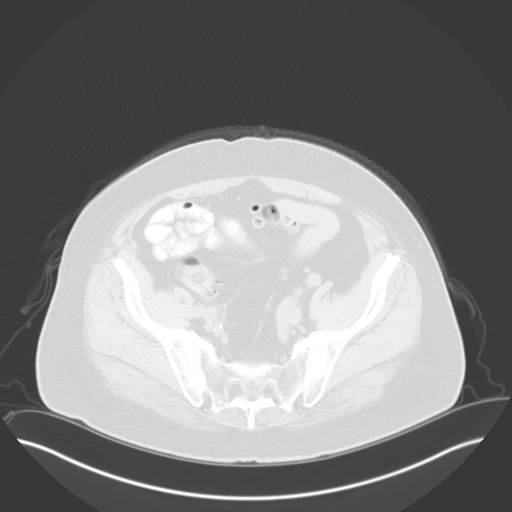
[im 46/137  lung]
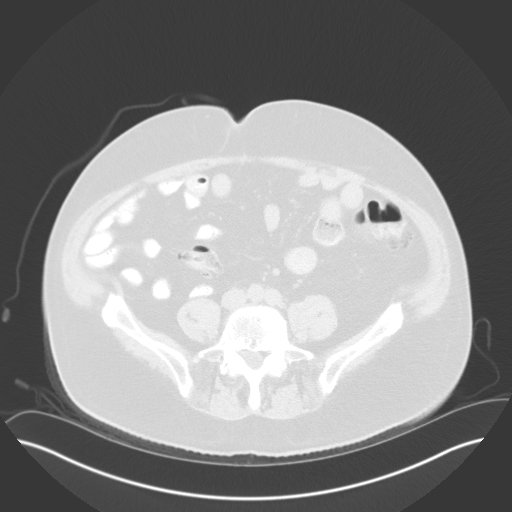
[im 57/137  mediastinal]
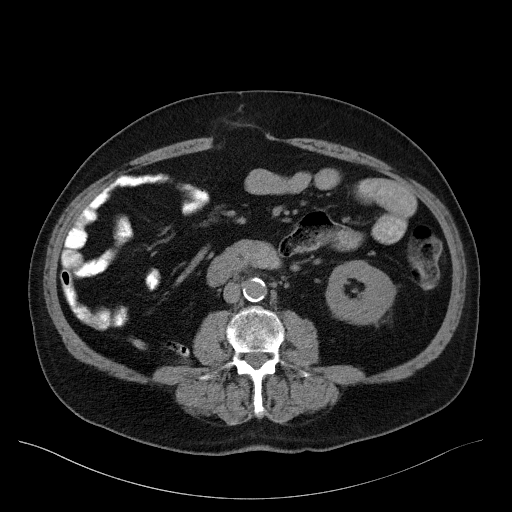
[im 57/137  lung]
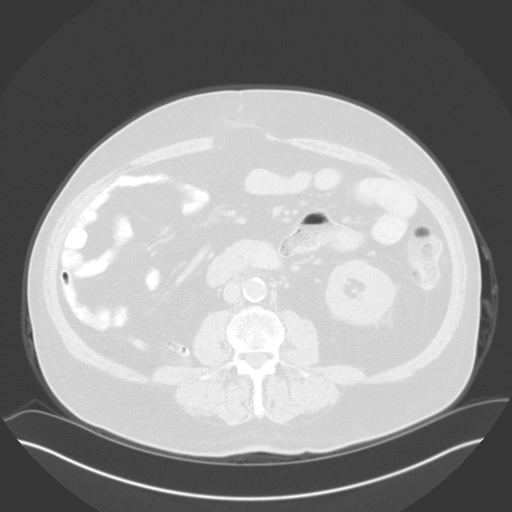
[im 80/137  lung]
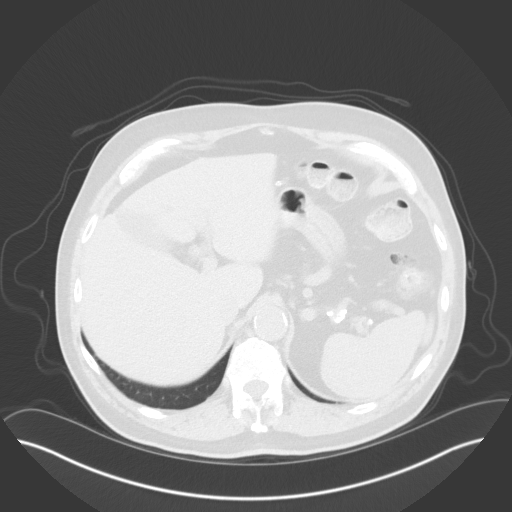
[im 91/137  lung]
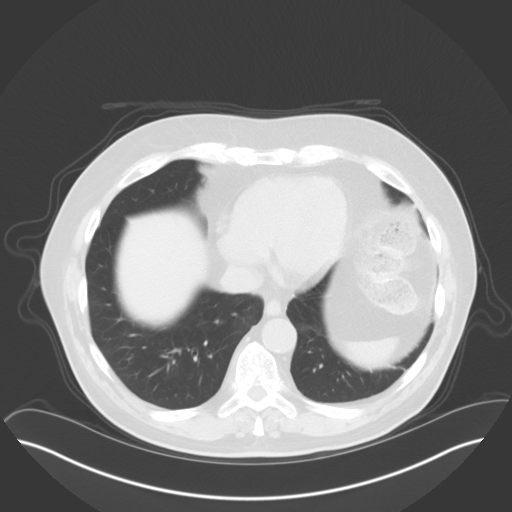
[im 103/137  lung]
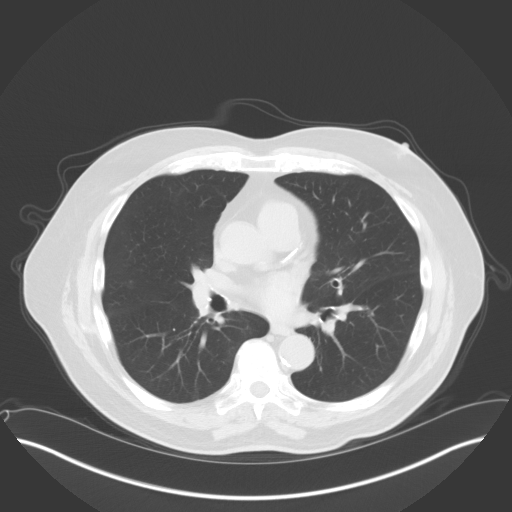
[im 114/137  mediastinal]
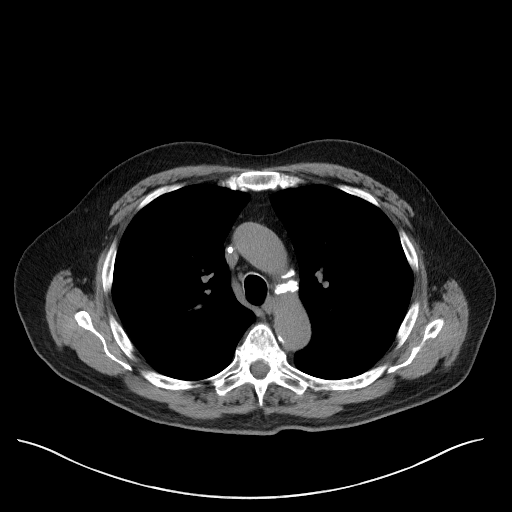
[im 114/137  lung]
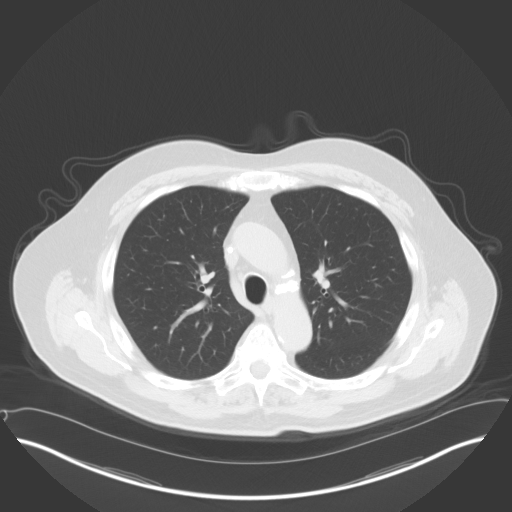
[im 125/137  lung]
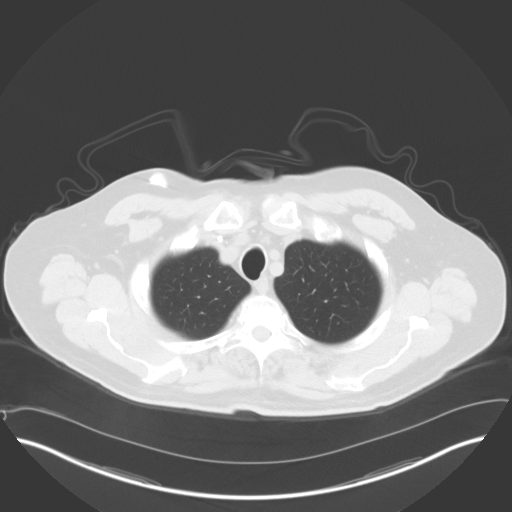

[Series 4: coronals · coronal · 0.82mm/px · 3 of 151 slices shown]
[im 31/151  lung]
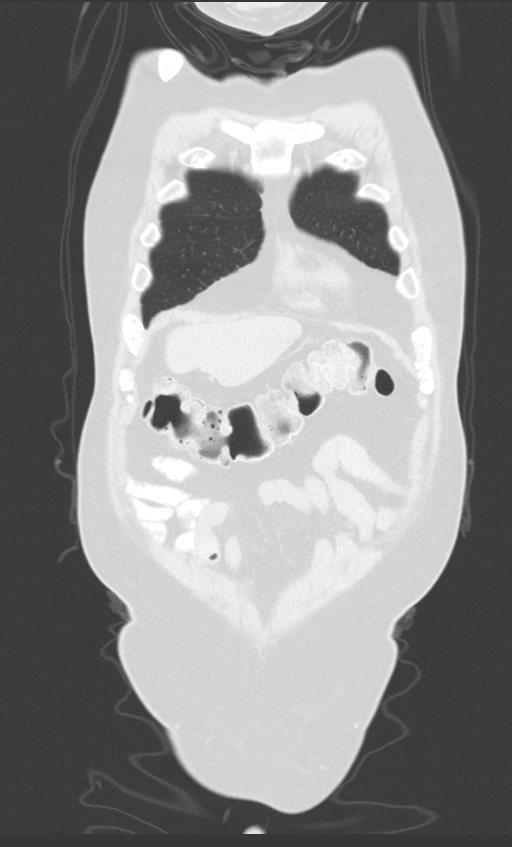
[im 61/151  lung]
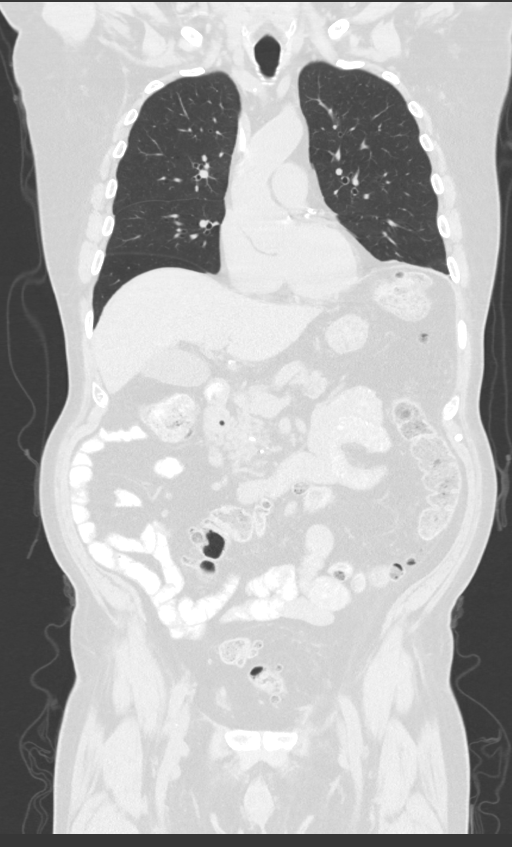
[im 91/151  lung]
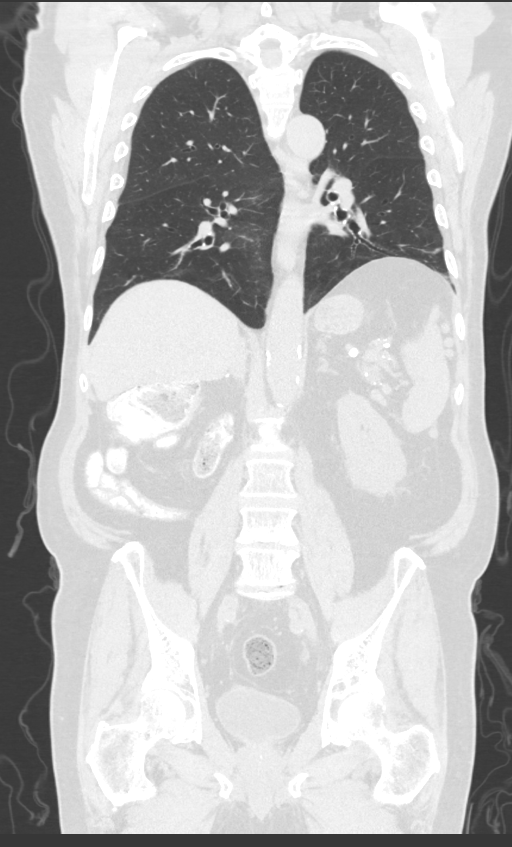

[13 of 36 positions shown; findings below may reference images not displayed]

FINDINGS: CT CHEST FINDINGS

Cardiovascular: Right chest port catheter. Aortic atherosclerosis.
Normal heart size. Left and right coronary artery calcifications. No
pericardial effusion.

Mediastinum/Nodes: No enlarged mediastinal, hilar, or axillary lymph
nodes. Thyroid gland, trachea, and esophagus demonstrate no
significant findings.

Lungs/Pleura: Minimal bandlike scarring of the left lung base. No
pleural effusion or pneumothorax.

Musculoskeletal: No chest wall mass or suspicious osseous lesions
identified.

CT ABDOMEN PELVIS FINDINGS

Hepatobiliary: No solid liver abnormality is seen. Tiny gallstones
in the dependent gallbladder. No gallbladder wall thickening, or
biliary dilatation.

Pancreas: Parenchymal calcifications throughout the pancreas. No
pancreatic ductal dilatation or surrounding inflammatory changes.

Spleen: Normal in size without significant abnormality.

Adrenals/Urinary Tract: Status post right adrenalectomy. Left
adrenal gland is unremarkable. Status post right nephrectomy. No
soft tissue within the nephrectomy bed. The left kidney is normal in
noncontrast appearance, without renal calculi, obvious solid lesion,
or hydronephrosis. Bladder is unremarkable.

Stomach/Bowel: Stomach is within normal limits. Appendix appears
normal. No evidence of bowel wall thickening, distention, or
inflammatory changes. Descending and sigmoid diverticulosis.

Vascular/Lymphatic: Aortic atherosclerosis. Continued enlargement of
a preaortic lymph node, measuring 2.0 x 1.7 cm, previously 1.4 x
cm (series 2, image 76). Other nodes not significantly changed.

Reproductive: No mass or other abnormality.

Other: Moderate left inguinal hernia containing a single
nonobstructive loop of sigmoid colon (series 2, image 122). No
ascites.

Musculoskeletal: No acute osseous findings.
IMPRESSION: 1. Status post right nephrectomy and adrenalectomy. No noncontrast
CT evidence of local recurrence.
2. Continued enlargement of a preaortic lymph node, consistent with
an enlarging nodal metastasis. Other nodes not significantly
changed.
3. No evidence of metastatic disease in the chest.
4. Moderate left inguinal hernia containing a single nonobstructed
loop of sigmoid colon.
5. Stigmata of chronic pancreatitis.
6. Cholelithiasis.
7. Coronary artery disease.

Aortic Atherosclerosis ([B7]-[B7]).

## 2022-02-07 IMAGING — CT CT CHEST W/O CM
2 of 4 series · 13 of 36 positions shown, 16 images · non-contrast
Comparison: [DATE]

CLINICAL DATA: Metastatic high-grade urothelial carcinoma, pelvic
adenopathy, status post right nephrectomy, ongoing immunotherapy *
Tracking Code: BO *



[Series 2: cap w/o · axial · non-contrast · 0.84mm/px · z∈[-613,-48]mm · 10 of 137 slices shown, 13 images]
[im 12/137  mediastinal]
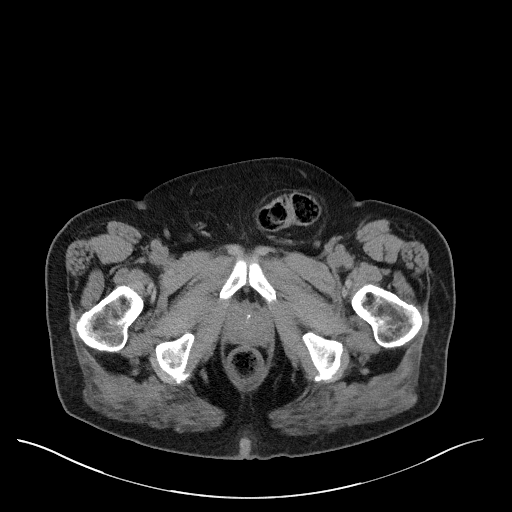
[im 12/137  lung]
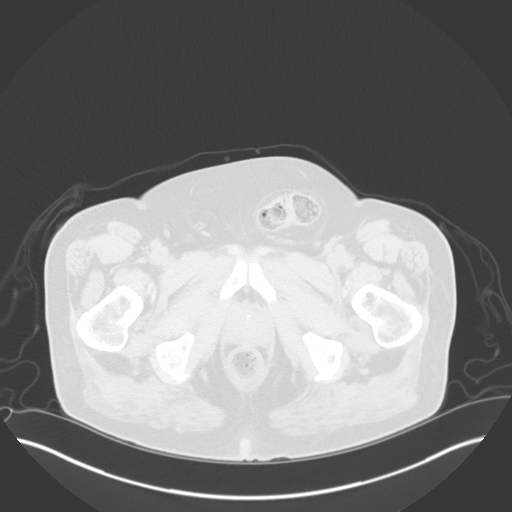
[im 23/137  lung]
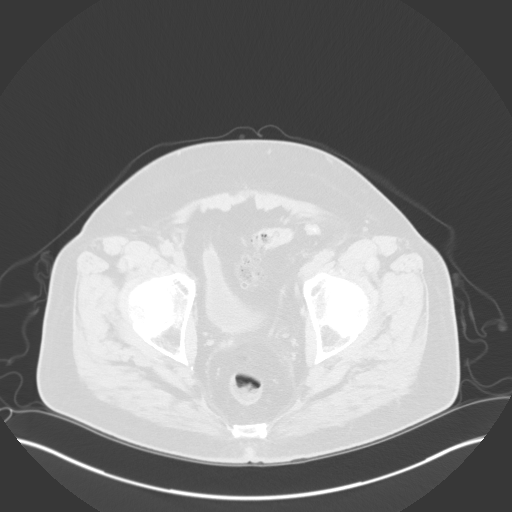
[im 35/137  lung]
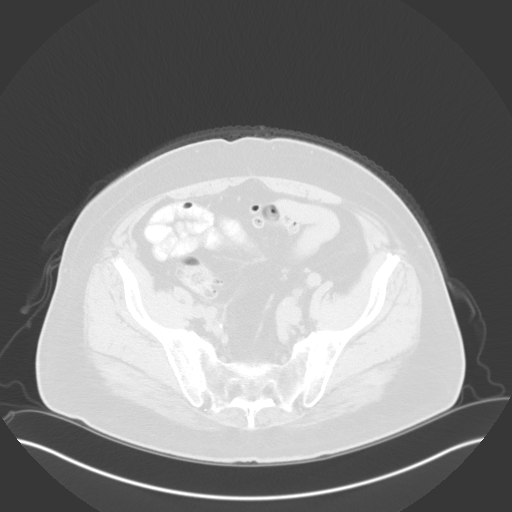
[im 46/137  lung]
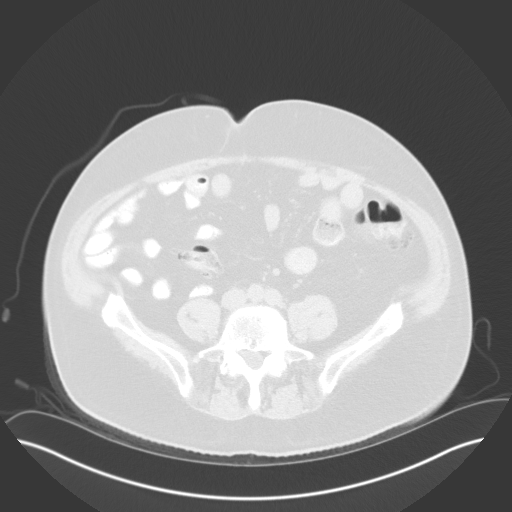
[im 57/137  mediastinal]
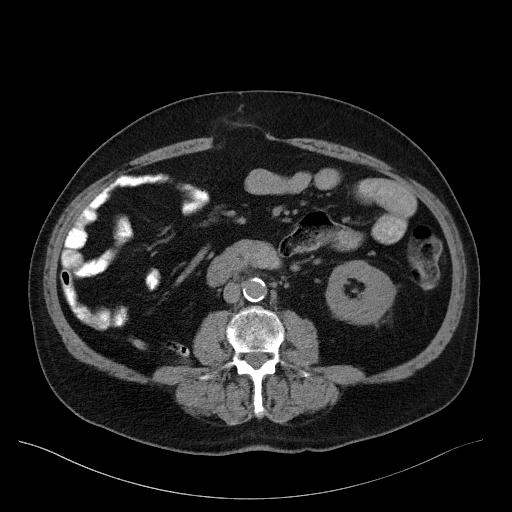
[im 57/137  lung]
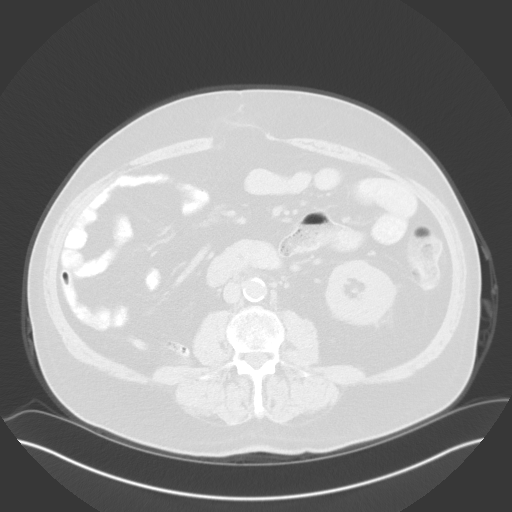
[im 80/137  lung]
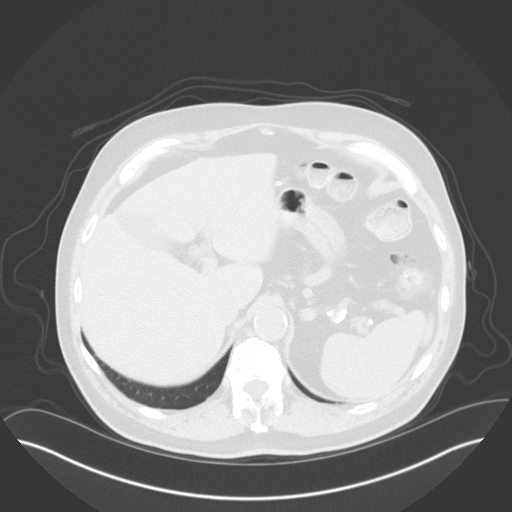
[im 91/137  lung]
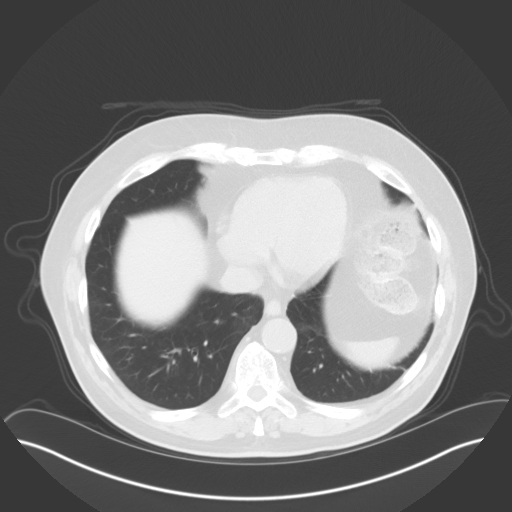
[im 103/137  lung]
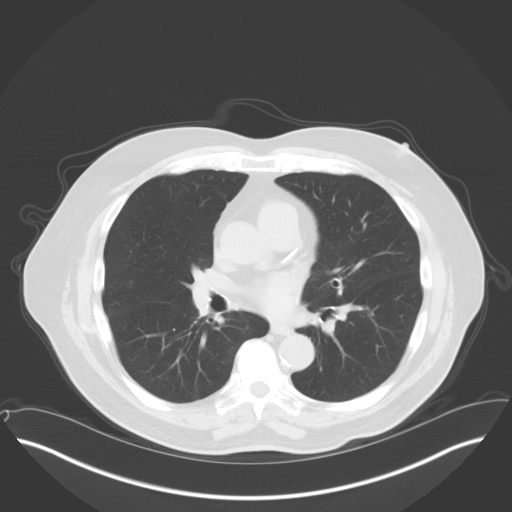
[im 114/137  mediastinal]
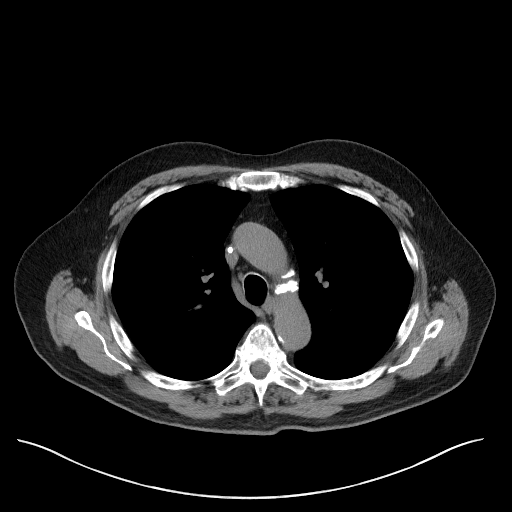
[im 114/137  lung]
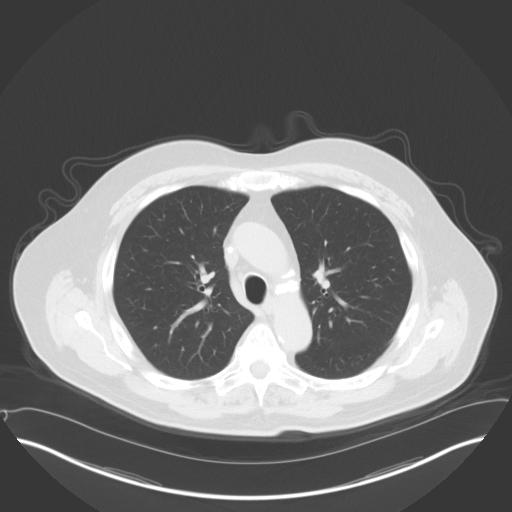
[im 125/137  lung]
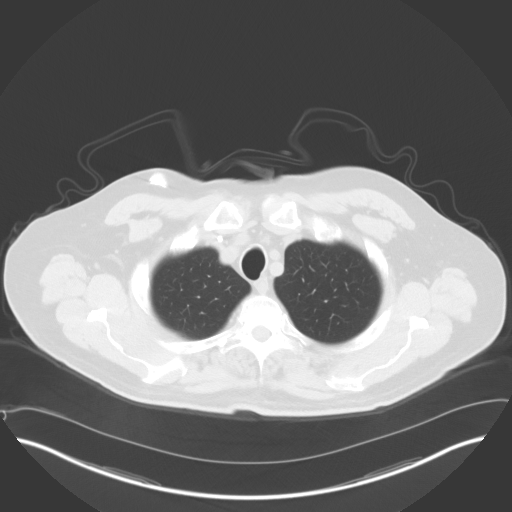

[Series 4: coronals · coronal · 0.82mm/px · 3 of 151 slices shown]
[im 31/151  lung]
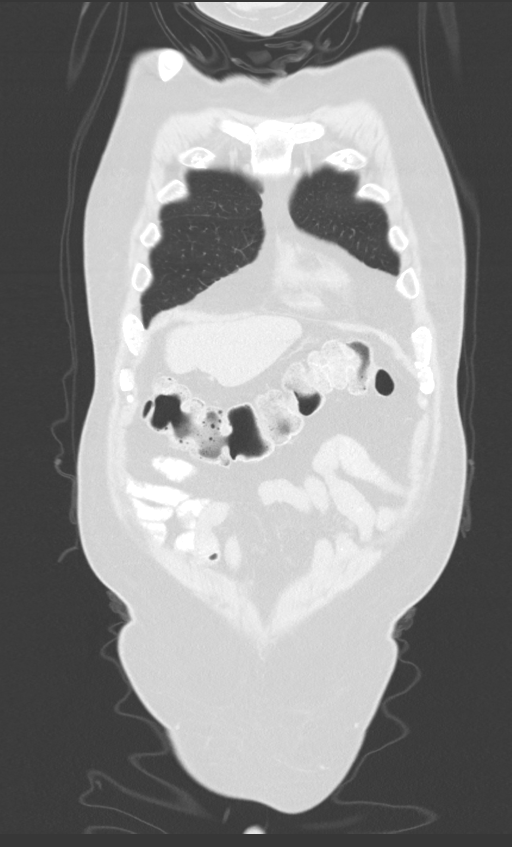
[im 61/151  lung]
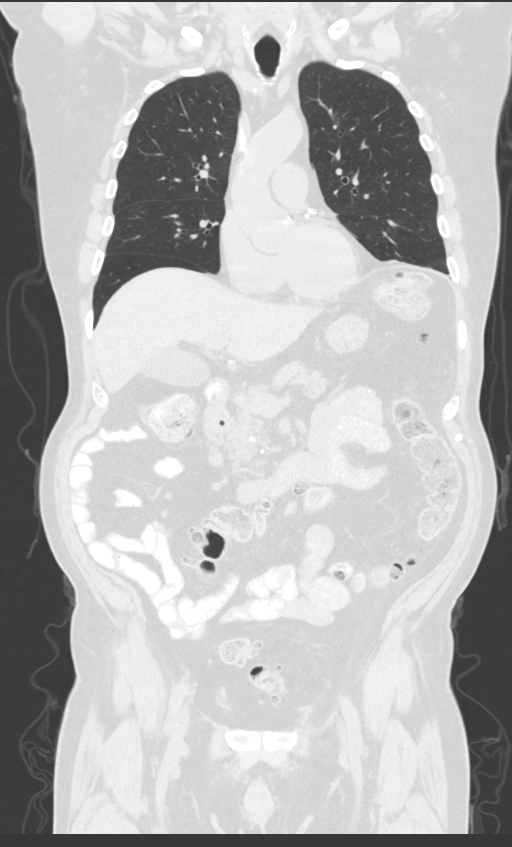
[im 91/151  lung]
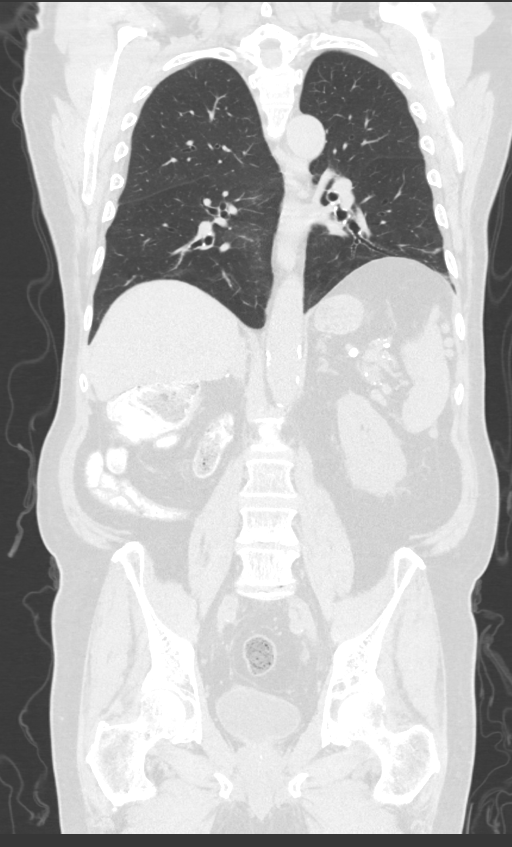

[13 of 36 positions shown; findings below may reference images not displayed]

FINDINGS: CT CHEST FINDINGS

Cardiovascular: Right chest port catheter. Aortic atherosclerosis.
Normal heart size. Left and right coronary artery calcifications. No
pericardial effusion.

Mediastinum/Nodes: No enlarged mediastinal, hilar, or axillary lymph
nodes. Thyroid gland, trachea, and esophagus demonstrate no
significant findings.

Lungs/Pleura: Minimal bandlike scarring of the left lung base. No
pleural effusion or pneumothorax.

Musculoskeletal: No chest wall mass or suspicious osseous lesions
identified.

CT ABDOMEN PELVIS FINDINGS

Hepatobiliary: No solid liver abnormality is seen. Tiny gallstones
in the dependent gallbladder. No gallbladder wall thickening, or
biliary dilatation.

Pancreas: Parenchymal calcifications throughout the pancreas. No
pancreatic ductal dilatation or surrounding inflammatory changes.

Spleen: Normal in size without significant abnormality.

Adrenals/Urinary Tract: Status post right adrenalectomy. Left
adrenal gland is unremarkable. Status post right nephrectomy. No
soft tissue within the nephrectomy bed. The left kidney is normal in
noncontrast appearance, without renal calculi, obvious solid lesion,
or hydronephrosis. Bladder is unremarkable.

Stomach/Bowel: Stomach is within normal limits. Appendix appears
normal. No evidence of bowel wall thickening, distention, or
inflammatory changes. Descending and sigmoid diverticulosis.

Vascular/Lymphatic: Aortic atherosclerosis. Continued enlargement of
a preaortic lymph node, measuring 2.0 x 1.7 cm, previously 1.4 x
cm (series 2, image 76). Other nodes not significantly changed.

Reproductive: No mass or other abnormality.

Other: Moderate left inguinal hernia containing a single
nonobstructive loop of sigmoid colon (series 2, image 122). No
ascites.

Musculoskeletal: No acute osseous findings.
IMPRESSION: 1. Status post right nephrectomy and adrenalectomy. No noncontrast
CT evidence of local recurrence.
2. Continued enlargement of a preaortic lymph node, consistent with
an enlarging nodal metastasis. Other nodes not significantly
changed.
3. No evidence of metastatic disease in the chest.
4. Moderate left inguinal hernia containing a single nonobstructed
loop of sigmoid colon.
5. Stigmata of chronic pancreatitis.
6. Cholelithiasis.
7. Coronary artery disease.

Aortic Atherosclerosis ([B7]-[B7]).

## 2022-02-11 DIAGNOSIS — R102 Pelvic and perineal pain: Secondary | ICD-10-CM | POA: Diagnosis not present

## 2022-02-19 ENCOUNTER — Other Ambulatory Visit (HOSPITAL_BASED_OUTPATIENT_CLINIC_OR_DEPARTMENT_OTHER): Payer: Medicare Other | Admitting: Oncology

## 2022-02-19 ENCOUNTER — Encounter: Payer: Self-pay | Admitting: Oncology

## 2022-02-19 DIAGNOSIS — D4959 Neoplasm of unspecified behavior of other genitourinary organ: Secondary | ICD-10-CM | POA: Diagnosis not present

## 2022-02-19 MED ORDER — TRAMADOL HCL 50 MG PO TABS: 50.0000 mg | ORAL_TABLET | Freq: Four times a day (QID) | ORAL | 1 refills | Status: DC | PRN

## 2022-02-21 ENCOUNTER — Telehealth: Payer: Self-pay

## 2022-02-21 ENCOUNTER — Encounter: Payer: Self-pay | Admitting: Oncology

## 2022-02-21 NOTE — Telephone Encounter (Signed)
Called pt to respond to my chart message related to patient constipation. Patient has been taking colace only. Advised to use Miralax and Senna. Reviewed acceptable dosages and mechanism of action.

## 2022-02-25 ENCOUNTER — Inpatient Hospital Stay: Payer: Medicare Other

## 2022-02-25 ENCOUNTER — Ambulatory Visit (HOSPITAL_COMMUNITY)
Admission: RE | Admit: 2022-02-25 | Discharge: 2022-02-25 | Disposition: A | Discharge status: Home / Self Care | Payer: Medicare Other | Source: Ambulatory Visit | Attending: Oncology | Admitting: Oncology

## 2022-02-25 ENCOUNTER — Inpatient Hospital Stay: Payer: Medicare Other | Attending: Oncology | Admitting: Oncology

## 2022-02-25 ENCOUNTER — Other Ambulatory Visit: Payer: Self-pay

## 2022-02-25 VITALS — BP 159/85 | HR 71 | Temp 98.1°F | Resp 16 | Ht 70.0 in | Wt 186.6 lb

## 2022-02-25 DIAGNOSIS — Z95828 Presence of other vascular implants and grafts: Secondary | ICD-10-CM

## 2022-02-25 DIAGNOSIS — M48061 Spinal stenosis, lumbar region without neurogenic claudication: Secondary | ICD-10-CM | POA: Diagnosis not present

## 2022-02-25 DIAGNOSIS — D4959 Neoplasm of unspecified behavior of other genitourinary organ: Secondary | ICD-10-CM

## 2022-02-25 DIAGNOSIS — C679 Malignant neoplasm of bladder, unspecified: Secondary | ICD-10-CM | POA: Diagnosis not present

## 2022-02-25 DIAGNOSIS — Z5112 Encounter for antineoplastic immunotherapy: Secondary | ICD-10-CM | POA: Diagnosis not present

## 2022-02-25 DIAGNOSIS — M5136 Other intervertebral disc degeneration, lumbar region: Secondary | ICD-10-CM | POA: Diagnosis not present

## 2022-02-25 DIAGNOSIS — M40204 Unspecified kyphosis, thoracic region: Secondary | ICD-10-CM | POA: Diagnosis not present

## 2022-02-25 LAB — CBC WITH DIFFERENTIAL (CANCER CENTER ONLY)
Abs Immature Granulocytes: 0.02 10*3/uL (ref 0.00–0.07)
Basophils Absolute: 0 10*3/uL (ref 0.0–0.1)
Basophils Relative: 1 %
Eosinophils Absolute: 0.3 10*3/uL (ref 0.0–0.5)
Eosinophils Relative: 6 %
HCT: 33.6 % — ABNORMAL LOW (ref 39.0–52.0)
Hemoglobin: 11.5 g/dL — ABNORMAL LOW (ref 13.0–17.0)
Immature Granulocytes: 0 %
Lymphocytes Relative: 21 %
Lymphs Abs: 1 10*3/uL (ref 0.7–4.0)
MCH: 29.9 pg (ref 26.0–34.0)
MCHC: 34.2 g/dL (ref 30.0–36.0)
MCV: 87.5 fL (ref 80.0–100.0)
Monocytes Absolute: 0.3 10*3/uL (ref 0.1–1.0)
Monocytes Relative: 7 %
Neutro Abs: 3.2 10*3/uL (ref 1.7–7.7)
Neutrophils Relative %: 65 %
Platelet Count: 168 10*3/uL (ref 150–400)
RBC: 3.84 MIL/uL — ABNORMAL LOW (ref 4.22–5.81)
RDW: 13.8 % (ref 11.5–15.5)
WBC Count: 4.8 10*3/uL (ref 4.0–10.5)
nRBC: 0 % (ref 0.0–0.2)

## 2022-02-25 LAB — CMP (CANCER CENTER ONLY)
ALT: 10 U/L (ref 0–44)
AST: 12 U/L — ABNORMAL LOW (ref 15–41)
Albumin: 3.9 g/dL (ref 3.5–5.0)
Alkaline Phosphatase: 95 U/L (ref 38–126)
Anion gap: 4 — ABNORMAL LOW (ref 5–15)
BUN: 15 mg/dL (ref 8–23)
CO2: 26 mmol/L (ref 22–32)
Calcium: 9.6 mg/dL (ref 8.9–10.3)
Chloride: 106 mmol/L (ref 98–111)
Creatinine: 1.19 mg/dL (ref 0.61–1.24)
GFR, Estimated: >60 mL/min (ref >60)
Glucose, Bld: 280 mg/dL — ABNORMAL HIGH (ref 70–99)
Potassium: 4.6 mmol/L (ref 3.5–5.1)
Sodium: 136 mmol/L (ref 135–145)
Total Bilirubin: 0.4 mg/dL (ref 0.3–1.2)
Total Protein: 7.2 g/dL (ref 6.5–8.1)

## 2022-02-25 IMAGING — MR MR LUMBAR SPINE WO/W CM
4 of 7 series · 29 of 48 positions shown · IV contrast (10 GADAVIST)
Comparison: Restaging CT Chest, Abdomen, and Pelvis today
[DATE]. Thoracic MRI today reported separately.

CLINICAL DATA: 75-year-old male with metastatic high-grade
urothelial carcinoma.

EXAM:
MRI LUMBAR SPINE WITHOUT AND WITH CONTRAST
TECHNIQUE: Multiplanar and multiecho pulse sequences of the lumbar spine were
obtained without and with intravenous contrast.
CONTRAST:  8mL GADAVIST GADOBUTROL 1 MMOL/ML IV SOLN

[Series 1: T1 · sagittal · 4.0mm · 0.81mm/px · 3 of 14 slices shown (1 of 2)]
[im 1/14]
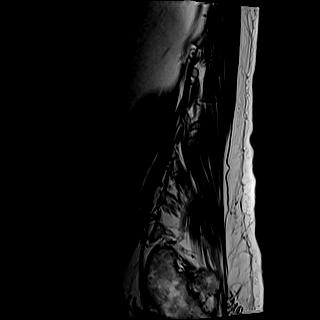
[im 7/14]
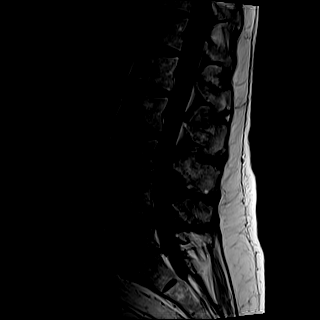
[im 14/14]
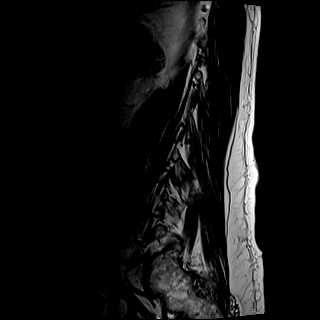

[Series 3: T2 · axial · 4.0mm · 0.62mm/px · z∈[-437,-210]mm · 11 of 38 slices shown]
[im 1/38]
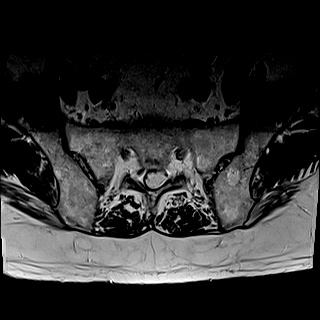
[im 4/38]
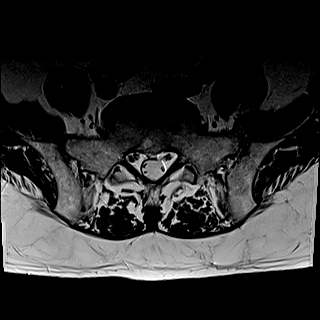
[im 8/38]
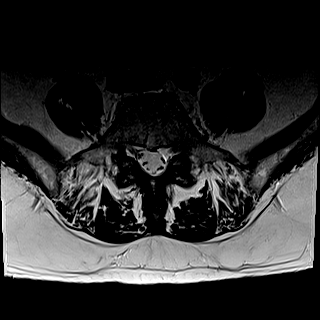
[im 12/38]
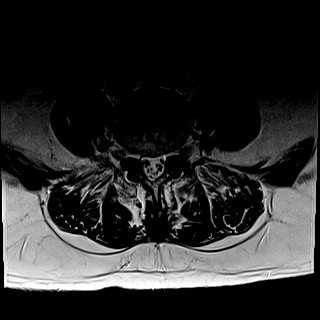
[im 15/38]
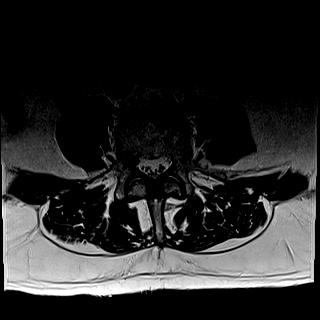
[im 19/38]
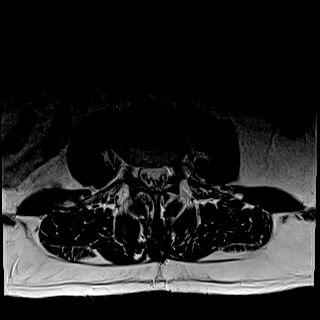
[im 23/38]
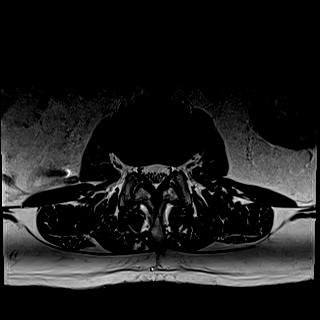
[im 26/38]
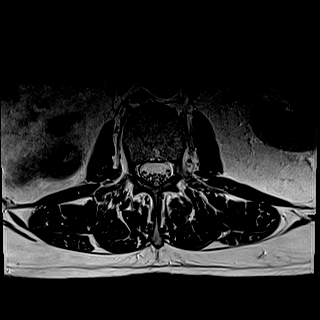
[im 30/38]
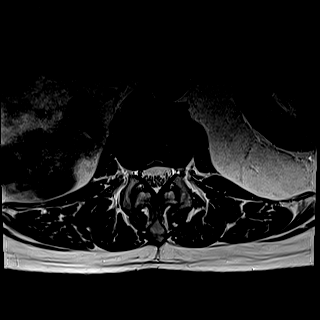
[im 34/38]
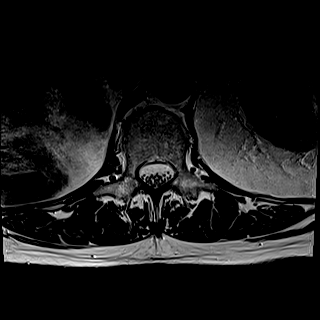
[im 38/38]
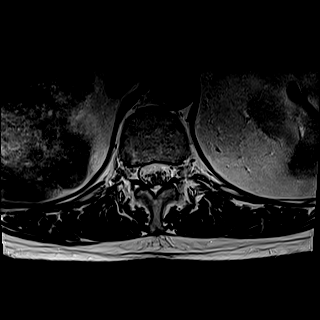

[Series 4: T1 · axial · 4.0mm · 0.39mm/px · z∈[-437,-210]mm · 11 of 38 slices shown (2 of 2)]
[im 1/38]
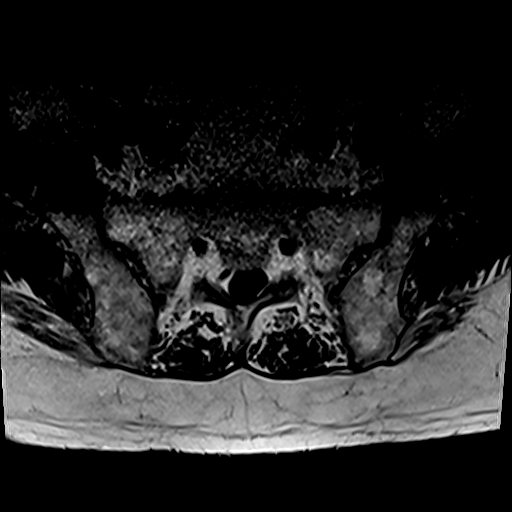
[im 4/38]
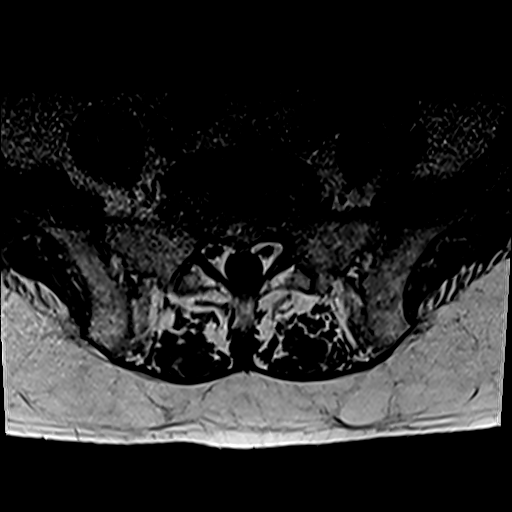
[im 8/38]
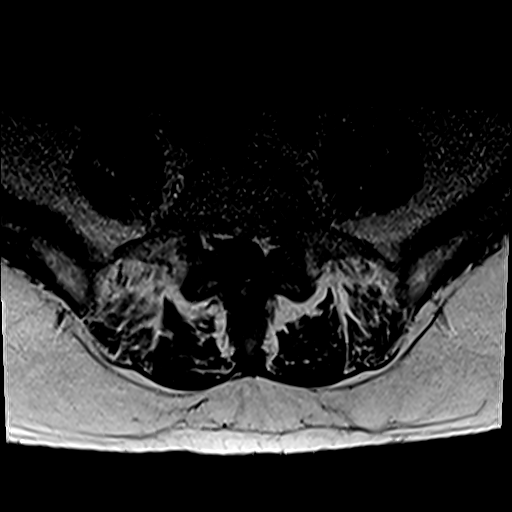
[im 12/38]
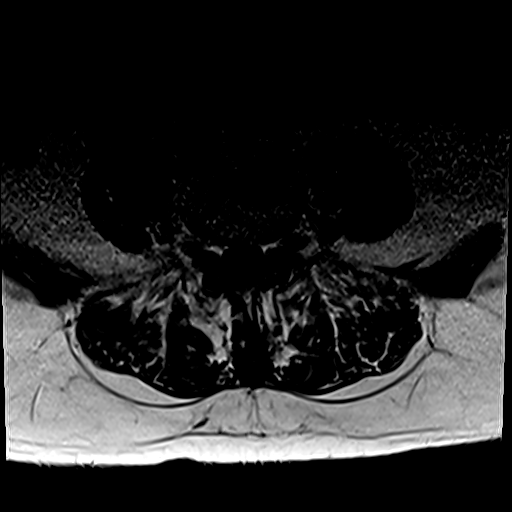
[im 15/38]
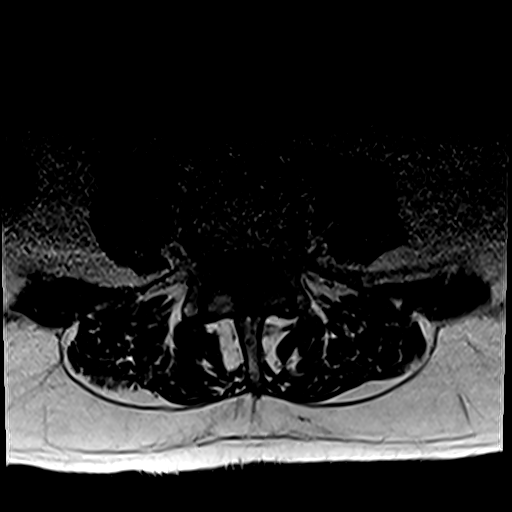
[im 19/38]
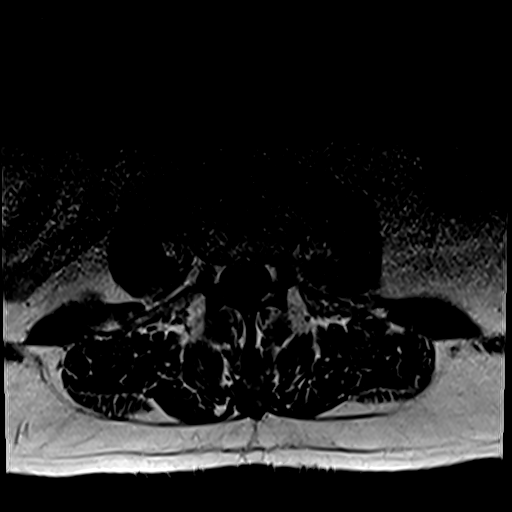
[im 23/38]
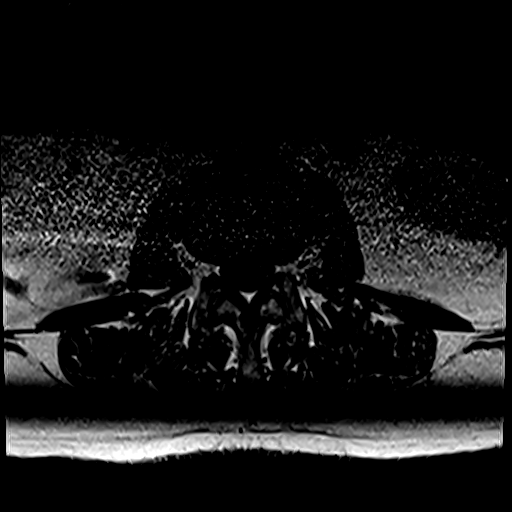
[im 26/38]
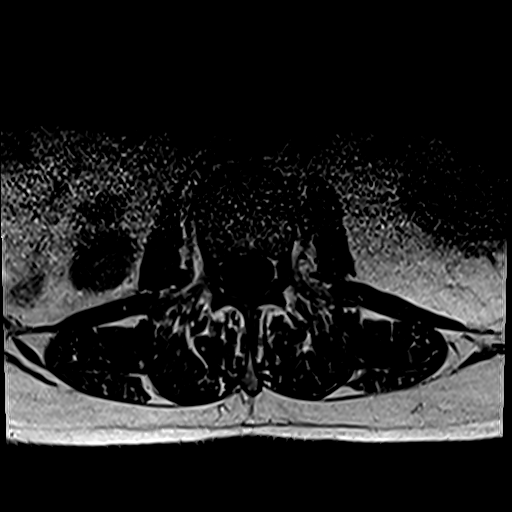
[im 30/38]
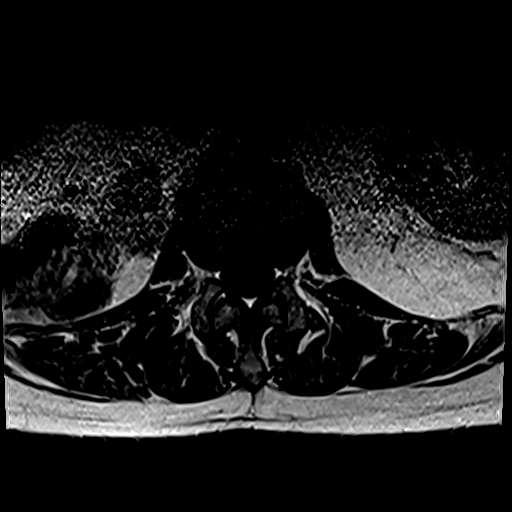
[im 34/38]
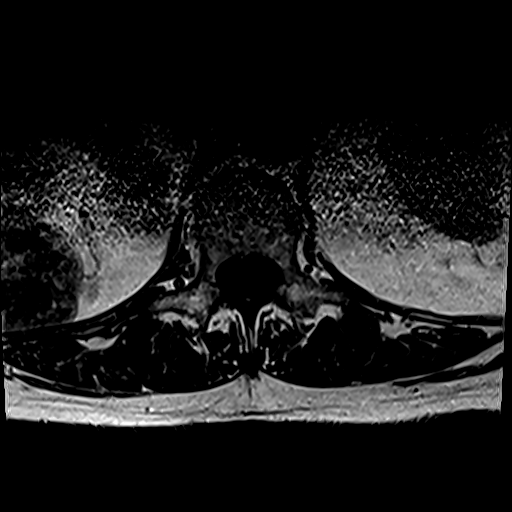
[im 38/38]
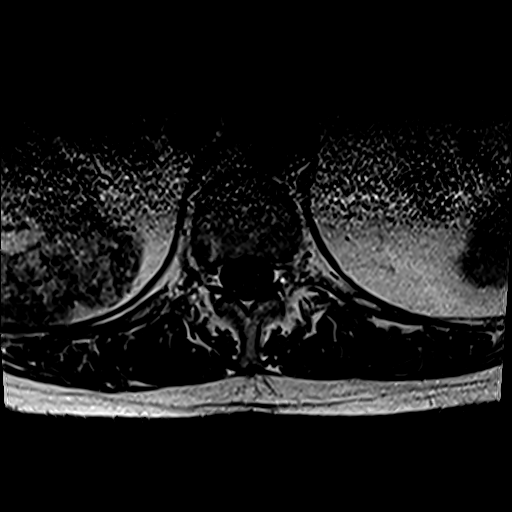

[Series 5: T2 post-contrast · sagittal · 4.0mm · 0.81mm/px · 4 of 14 slices shown]
[im 1/14]
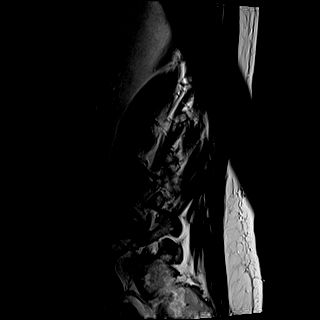
[im 5/14]
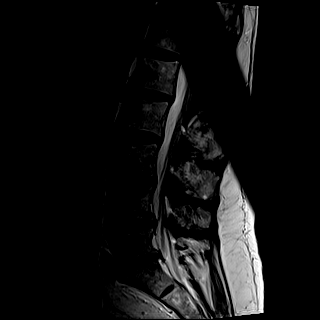
[im 9/14]
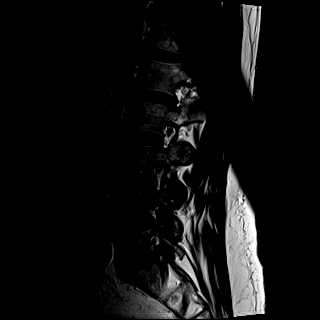
[im 14/14]
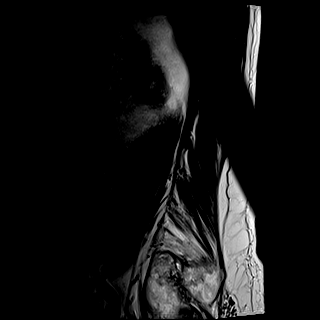

[29 of 48 positions shown; findings below may reference images not displayed]

FINDINGS: Segmentation: Normal, concordant with the thoracic MRI the same day.

Alignment: Relatively normal lumbar lordosis. No significant
scoliosis or spondylolisthesis.

Vertebrae: Mild chronic T12 superior endplate compression
redemonstrated. There is a degenerative L2 superior endplate
Schmorl's node which is partially enhancing (series 6, image 9).
Mild chronic L4 inferior endplate compression fracture and Schmorl's
node with slight enhancement. But no suspicious osseous lesion in
the lumbar spine or visible sacrum. Background bone marrow signal
within normal limits. Intact visible SI joints.

Conus medullaris and cauda equina: Conus medullaris better
demonstrated on thoracic sagittal T2 weighted imaging. Conus extends
to the T12-L1 Level. No lower spinal cord or conus signal
abnormality. No abnormal intradural enhancement. No dural
thickening. Cauda equina nerve roots appear normal.

Paraspinal and other soft tissues: Stable from the recent CT.
Negative lumbar paraspinal soft tissues.

Disc levels:

Lumbar spine degeneration but fairly capacious underlying spinal
canal. No significant lumbar spinal stenosis. There is moderate
degenerative neural foraminal stenosis at the left L4 and bilateral
L5 nerve levels.
IMPRESSION: 1. No metastatic disease identified in the lumbar spine.
2. Chronic compression fractures at T12 and L4. Degenerative L2
superior endplate Schmorl's node.
3. Fairly age-appropriate lumbar spine degeneration with no
significant lumbar spinal stenosis.

## 2022-02-25 IMAGING — MR MR THORACIC SPINE WO/W CM
8 of 9 series · 32 of 48 positions shown · IV contrast (gadavist)
Comparison: Restaging CT Chest, Abdomen, and Pelvis [DATE].

CLINICAL DATA: 75-year-old male with metastatic high-grade
urothelial carcinoma.

EXAM:
MRI THORACIC WITHOUT AND WITH CONTRAST
TECHNIQUE: Multiplanar and multiecho pulse sequences of the thoracic spine were
obtained without and with intravenous contrast.
CONTRAST:  8mL GADAVIST GADOBUTROL 1 MMOL/ML IV SOLN

[Series 16: T1 · sagittal · 4.0mm · 1.72mm/px · 2 of 8 slices shown (1 of 3)]
[im 1/8]
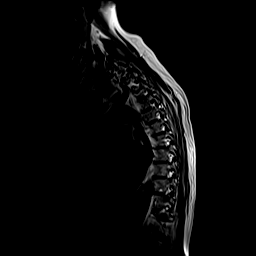
[im 8/8]
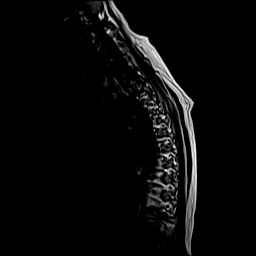

[Series 17: STIR · sagittal · 3.0mm · 1.00mm/px · 3 of 16 slices shown]
[im 1/16]
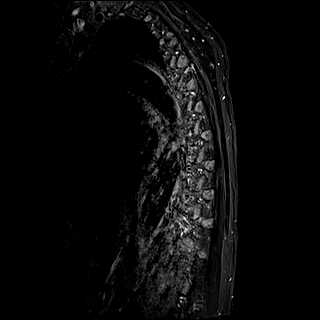
[im 8/16]
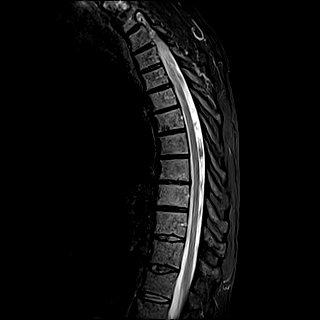
[im 16/16]
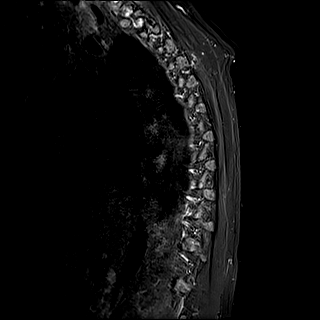

[Series 18: T1 · sagittal · 3.0mm · 1.00mm/px · 3 of 15 slices shown (2 of 3)]
[im 1/15]
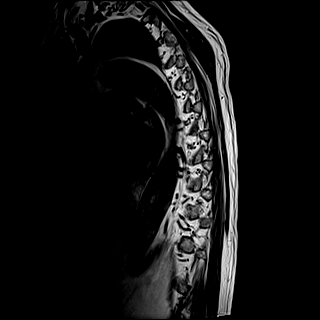
[im 8/15]
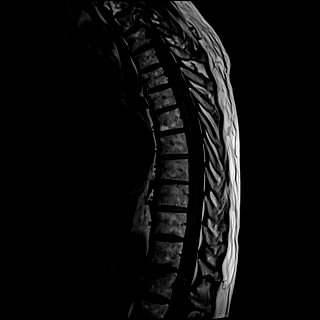
[im 15/15]
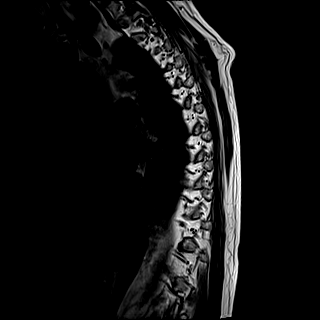

[Series 19: T2 · axial · 4.0mm · 0.78mm/px · z∈[-256,-23]mm · 8 of 37 slices shown]
[im 1/37]
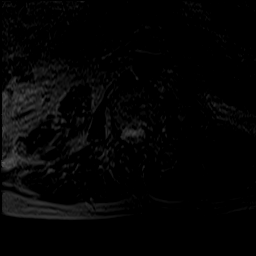
[im 6/37]
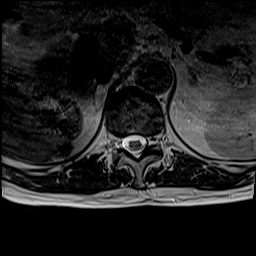
[im 11/37]
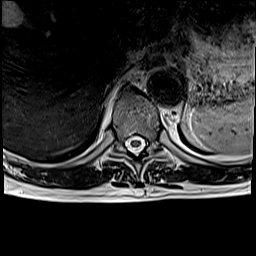
[im 16/37]
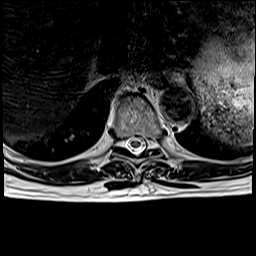
[im 21/37]
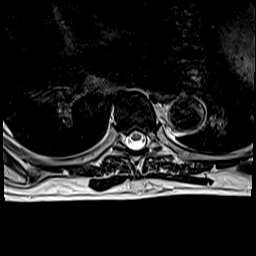
[im 26/37]
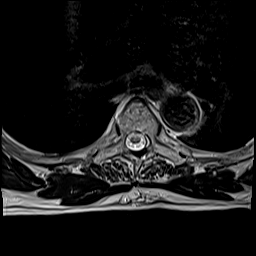
[im 31/37]
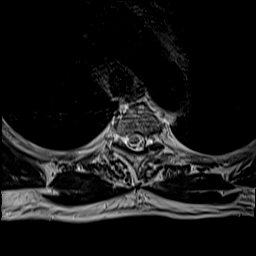
[im 37/37]
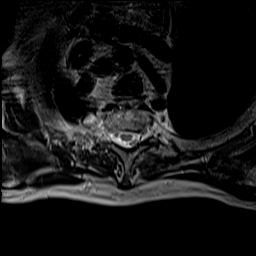

[Series 21: T1 · axial · 4.0mm · 0.39mm/px · z∈[-256,-10]mm · 8 of 40 slices shown (3 of 3)]
[im 1/40]
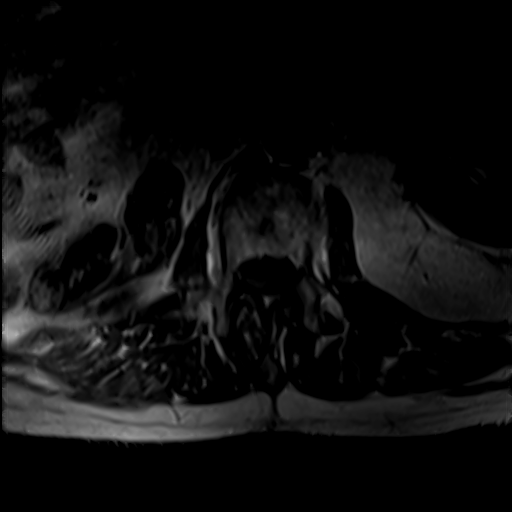
[im 5/40]
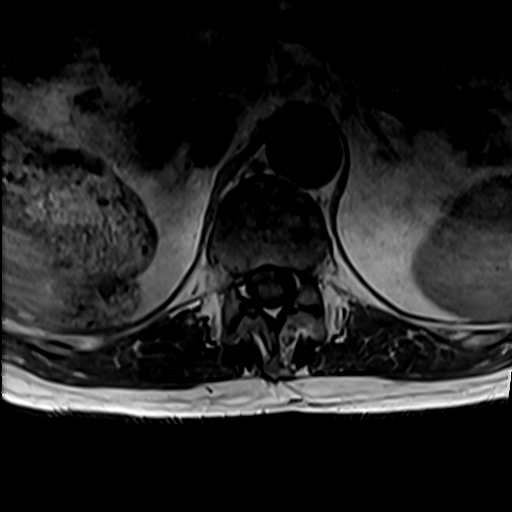
[im 10/40]
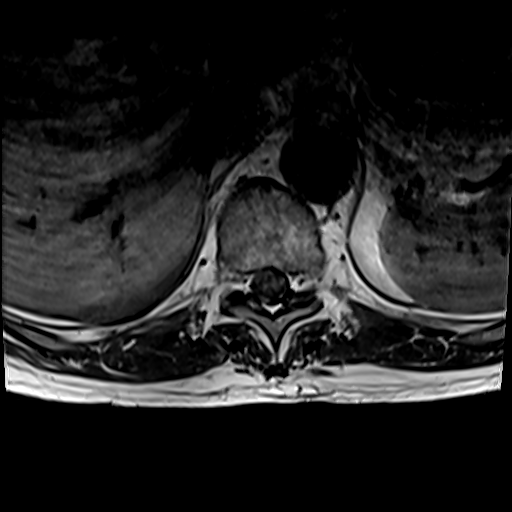
[im 15/40]
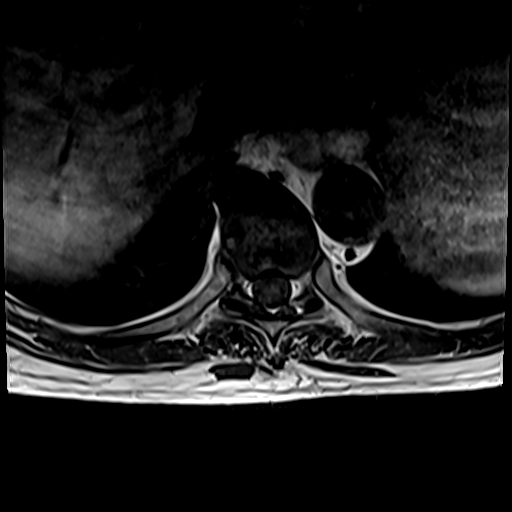
[im 25/40]
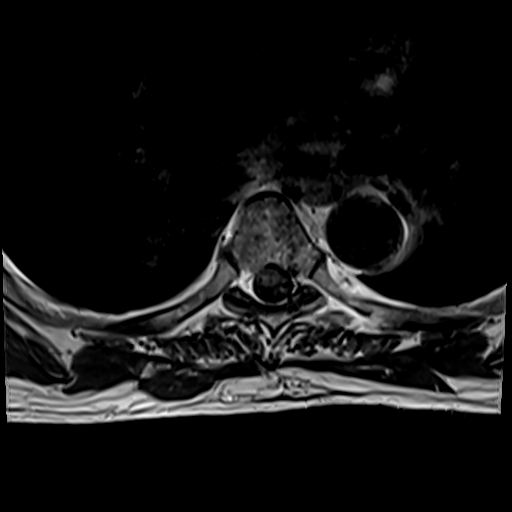
[im 30/40]
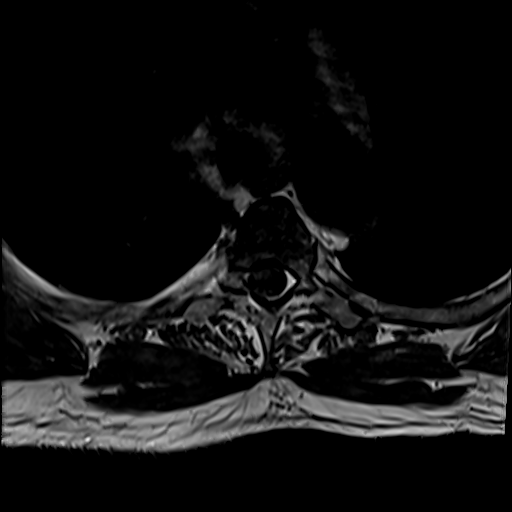
[im 35/40]
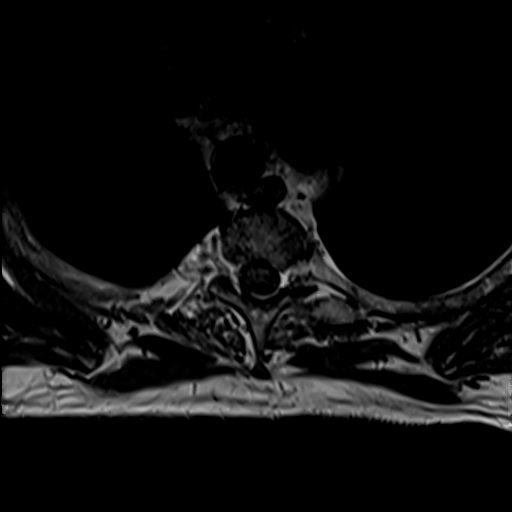
[im 40/40]
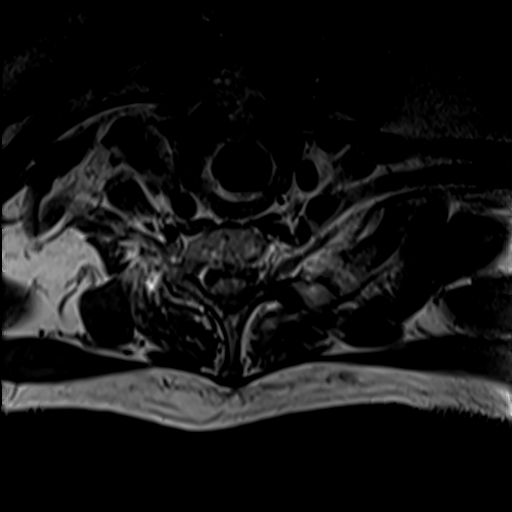

[Series 22: T2 post-contrast · sagittal · 3.0mm · 0.83mm/px · 3 of 16 slices shown]
[im 1/16]
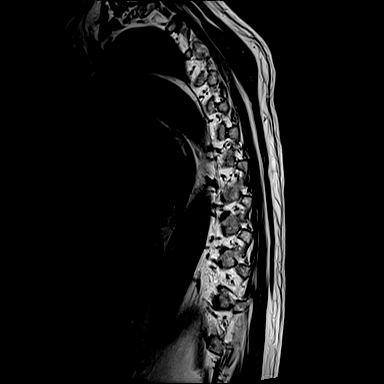
[im 8/16]
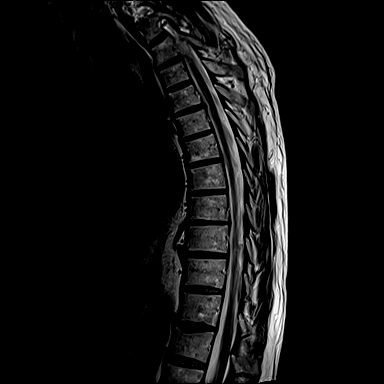
[im 16/16]
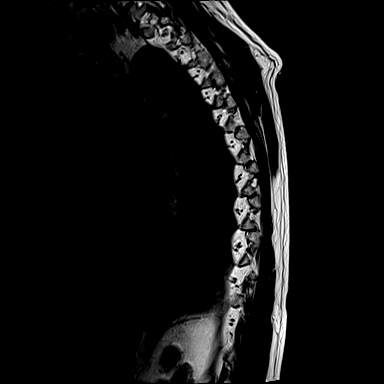

[Series 23: T1 fat-sat post-contrast · sagittal · 3.0mm · 1.00mm/px · 3 of 16 slices shown]
[im 1/16]
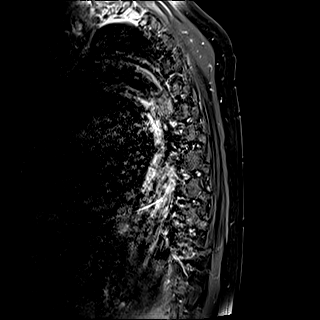
[im 8/16]
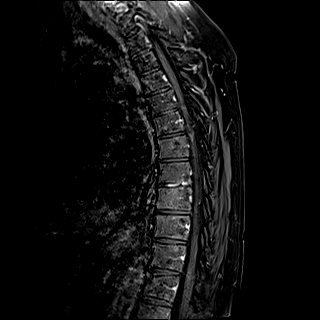
[im 16/16]
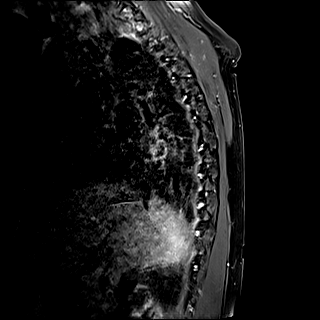

[Series 24: T1 post-contrast · axial · 4.0mm · 0.39mm/px · z∈[-256,-196]mm · 2 of 40 slices shown]
[im 1/40]
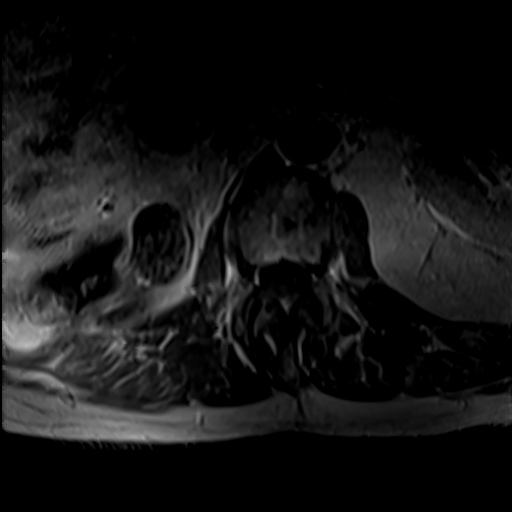
[im 5/40]
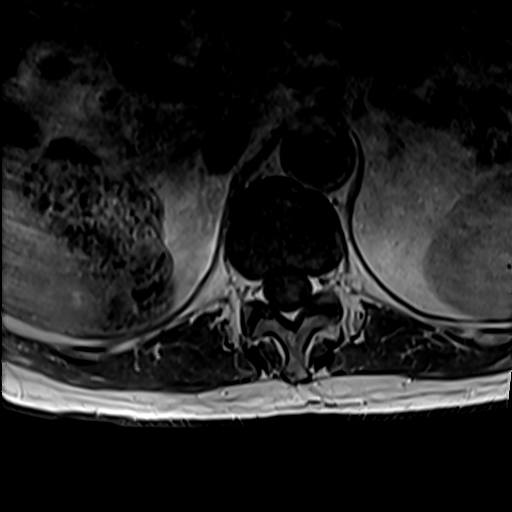

[32 of 48 positions shown; findings below may reference images not displayed]

FINDINGS: Limited cervical spine imaging:  Straightening of cervical lordosis.

Thoracic spine segmentation:  Normal on the comparison CT.

Alignment: Subtle anterolisthesis at the cervicothoracic junction
redemonstrated. Relatively normal thoracic kyphosis.

Vertebrae: Mild chronic T12 superior endplate compression. No marrow
edema or enhancement there. Background bone marrow signal mildly
heterogeneous throughout the thoracic spine but no suspicious
edematous or enhancing lesion is identified. Scattered small benign
vertebral hemangiomas including at the T1 and T10 levels. Grossly
negative posterior ribs.

Cord: Normal. No abnormal intradural enhancement. No dural
thickening. Fairly capacious spinal canal.

Paraspinal and other soft tissues: Stable from the recent CT.
Negative thoracic paraspinal soft tissues.

Disc levels:

Age-appropriate thoracic spine degeneration, with fairly capacious
spinal canal and no thoracic spinal stenosis. No significant neural
foraminal stenosis.
IMPRESSION: No metastatic disease identified in the thoracic spine.

Chronic T12 compression fracture.

Age-appropriate thoracic spine degeneration.

## 2022-02-25 MED ORDER — SODIUM CHLORIDE 0.9% FLUSH
10.0000 mL | INTRAVENOUS | Status: DC | PRN
  Administered 2022-02-25: 10 mL

## 2022-02-25 MED ORDER — SODIUM CHLORIDE 0.9 % IV SOLN: Freq: Once | INTRAVENOUS | Status: AC

## 2022-02-25 MED ORDER — SODIUM CHLORIDE 0.9 % IV SOLN
480.0000 mg | Freq: Once | INTRAVENOUS | Status: AC
  Administered 2022-02-25: 480 mg via INTRAVENOUS
  Filled 2022-02-25: qty 48

## 2022-02-25 MED ORDER — HEPARIN SOD (PORK) LOCK FLUSH 100 UNIT/ML IV SOLN
500.0000 [IU] | Freq: Once | INTRAVENOUS | Status: AC | PRN
  Administered 2022-02-25: 500 [IU]

## 2022-02-25 MED ORDER — SODIUM CHLORIDE 0.9% FLUSH
10.0000 mL | Freq: Once | INTRAVENOUS | Status: AC
  Administered 2022-02-25: 10 mL

## 2022-02-25 MED ORDER — GADOBUTROL 1 MMOL/ML IV SOLN
8.0000 mL | Freq: Once | INTRAVENOUS | Status: AC | PRN
  Administered 2022-02-25: 8 mL via INTRAVENOUS

## 2022-02-25 MED ORDER — HYDROCODONE-ACETAMINOPHEN 5-325 MG PO TABS: 1.0000 | ORAL_TABLET | Freq: Four times a day (QID) | ORAL | 0 refills | Status: DC | PRN

## 2022-02-25 NOTE — Progress Notes (Signed)
Hematology and Oncology Follow Up Visit  Hector Lopez [id number] 1946-05-28 76 y.o. 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  He developed stage IV high-grade urothelial carcinoma with pelvic adenopathy in 2022.  Prior Therapy:  He underwent robotic assisted laparoscopic  nephroureterectomy completed on Feb 10, 2020.  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  He was found to have metastatic pericaval lymph node as well as a right common iliac lymph node.  Carboplatin and gemcitabine started on July 04, 2020.  He completed 6 cycles of therapy on October 17, 2020.  Current therapy: Nivolumab 480 mg every 4 weeks started on October 29, 2021.  He is here for cycle 4 of therapy.   Interim History: Hector Lopez presents today for repeat evaluation.  Since last visit, he reported 2 weeks of lower back and abdominal discomfort of unclear etiology.  He did report occasional signs and symptoms of constipation as well as a left inguinal hernia.  He is still able to eat and maintain his weight.  He started on Ultram without improvement in his symptoms.  He denies any neurological deficits.    Medications: Reviewed without changes. Current Outpatient Medications  Medication Sig Dispense Refill   amLODipine (NORVASC) 10 MG tablet Take 10 mg by mouth at bedtime.      cetirizine (ZYRTEC) 10 MG tablet Take 10 mg by mouth daily.     finasteride (PROSCAR) 5 MG tablet Take 5 mg by mouth daily.     glipiZIDE (GLUCOTROL) 10 MG tablet Take 10 mg by mouth daily before breakfast.      hydrOXYzine (ATARAX) 10 MG tablet Take 1 tablet (10 mg total) by mouth 3 (three) times daily as needed. 30 tablet 0   lidocaine-prilocaine (EMLA) cream Apply 1 application topically as needed. 30 g 0   lisinopril (ZESTRIL) 40 MG tablet Take 40 mg  by mouth at bedtime.      metFORMIN (GLUCOPHAGE) 1000 MG tablet Take 1,000 mg by mouth 2 (two) times daily with a meal.      omeprazole (PRILOSEC) 40 MG capsule Take 40 mg by mouth daily.     prochlorperazine (COMPAZINE) 10 MG tablet Take 1 tablet (10 mg total) by mouth every 6 (six) hours as needed for nausea or vomiting. 30 tablet 0   senna-docusate (SENOKOT-S) 8.6-50 MG tablet Take 1 tablet by mouth 2 (two) times daily. While taking strong pain meds to prevent cosntipation. 10 tablet 0   sertraline (ZOLOFT) 50 MG tablet Take 50 mg by mouth daily.     simvastatin (ZOCOR) 20 MG tablet Take 20 mg by mouth daily.      sulfamethoxazole-trimethoprim (BACTRIM DS) 800-160 MG tablet Take 1 tablet by mouth daily. Start taking one day prior to your appointment for your first follow-up and catheter removal.  Continue taking for three days. (Patient not taking: Reported on 06/20/2020) 3 tablet 0   traMADol (ULTRAM) 50 MG tablet Take 1 tablet (50 mg total) by mouth every 6 (six) hours as needed. 30 tablet 1   triamcinolone cream (KENALOG) 0.1 % Apply 1 application. topically 2 (two) times daily. 45 g 3   No current facility-administered medications for this visit.     Allergies:  Allergies  Allergen Reactions   Codeine Nausea And Vomiting      Physical Exam:    Blood pressure Marland Kitchen)  159/85, pulse 71, temperature 98.1 F (36.7 C), resp. rate 16, height '5\' 10"'$  (1.778 m), weight 186 lb 9.6 oz (84.6 kg), SpO2 97 %.      ECOG: 1    General appearance: Comfortable appearing without any discomfort Head: Normocephalic without any trauma Oropharynx: Mucous membranes are moist and pink without any thrush or ulcers. Eyes: Pupils are equal and round reactive to light. Lymph nodes: No cervical, supraclavicular, inguinal or axillary lymphadenopathy.   Heart:regular rate and rhythm.  S1 and S2 without leg edema. Lung: Clear without any rhonchi or wheezes.  No dullness to percussion. Abdomin: Soft,  nontender, nondistended with good bowel sounds.  No hepatosplenomegaly. Musculoskeletal: No joint deformity or effusion.  Full range of motion noted. Neurological: No deficits noted on motor, sensory and deep tendon reflex exam. Skin: No petechial rash or dryness.  Appeared moist.                  Lab Results: Lab Results  Component Value Date   WBC 4.9 01/22/2022   HGB 12.4 (L) 01/22/2022   HCT 35.9 (L) 01/22/2022   MCV 86.3 01/22/2022   PLT 145 (L) 01/22/2022     Chemistry      Component Value Date/Time   NA 132 (L) 01/22/2022 1147   K 5.0 01/22/2022 1147   CL 102 01/22/2022 1147   CO2 26 01/22/2022 1147   BUN 26 (H) 01/22/2022 1147   CREATININE 1.38 (H) 01/22/2022 1147      Component Value Date/Time   CALCIUM 8.9 01/22/2022 1147   ALKPHOS 74 01/22/2022 1147   AST 10 (L) 01/22/2022 1147   ALT 9 01/22/2022 1147   BILITOT 0.6 01/22/2022 1147      IMPRESSION: 1. Status post right nephrectomy and adrenalectomy. No noncontrast CT evidence of local recurrence. 2. Continued enlargement of a preaortic lymph node, consistent with an enlarging nodal metastasis. Other nodes not significantly changed. 3. No evidence of metastatic disease in the chest. 4. Moderate left inguinal hernia containing a single nonobstructed loop of sigmoid colon. 5. Stigmata of chronic pancreatitis. 6. Cholelithiasis. 7. Coronary artery disease.   Aortic Atherosclerosis (ICD10-I70.0).    Impression and Plan:  76 year old man with:   1. High-grade urothelial of the ureter with pelvic adenopathy diagnosed in 2023 indicating stage IV disease.  He continues to be on nivolumab without any major complications.  CT scan obtained on Feb 07, 2022 showed slight progression of his pelvic adenopathy although no other areas of metastasis noted.  Risks and benefits of continuing this treatment were discussed and alternative treatment options including antibody drug conjugate as well as oral  targeted therapy were reviewed.  At this time I opted to continue with the same regimen.  2.  IV access: Port-A-Cath currently in use without any issues.  3.  Immune mediated issues: Complications that include pneumonitis, colitis, thyroid disease onset autoimmune hepatitis were reiterated.  4.  Antiemetics: Compazine is available to him without any nausea or vomiting.  5.  Constipation: We have discussed strategies to improve the symptoms including aggressive bowel regimen.  6.  Back pain: Unclear etiology.  CT scan did not show any evidence of malignancy although tumor in the spine could account for the symptoms and he is scheduled to have an MRI done in the near future.  Radiation therapy or possible surgical intervention may be required if he has a tumor in that area that accounts for this pain.    7.  Follow-up: In 4  weeks for repeat follow-up this will be sooner if any abnormalities are detected on imaging studies.   30  minutes were spent on this visit.  The time was dedicated to reviewing imaging studies, disease status update and outlining future plan of care discussion.      Zola Button, MD 6/13/20239:47 AM

## 2022-02-25 NOTE — Patient Instructions (Signed)
[facility]  Discharge Instructions: Thank you for choosing [facility] to provide your oncology and hematology care.   If you have a lab appointment with the [facility], please go directly to the [facility] and check in at the registration area.   Wear comfortable clothing and clothing appropriate for easy access to any Portacath or PICC line.   We strive to give you quality time with your provider. You may need to reschedule your appointment if you arrive late (15 or more minutes).  Arriving late affects you and other patients whose appointments are after yours.  Also, if you miss three or more appointments without notifying the office, you may be dismissed from the clinic at the provider's discretion.      For prescription refill requests, have your pharmacy contact our office and allow 72 hours for refills to be completed.    Today you received the following chemotherapy and/or immunotherapy agents: Nivolumab.      To help prevent nausea and vomiting after your treatment, we encourage you to take your nausea medication as directed.  BELOW ARE SYMPTOMS THAT SHOULD BE REPORTED IMMEDIATELY: *FEVER GREATER THAN 100.4 F (38 C) OR HIGHER *CHILLS OR SWEATING *NAUSEA AND VOMITING THAT IS NOT CONTROLLED WITH YOUR NAUSEA MEDICATION *UNUSUAL SHORTNESS OF BREATH *UNUSUAL BRUISING OR BLEEDING *URINARY PROBLEMS (pain or burning when urinating, or frequent urination) *BOWEL PROBLEMS (unusual diarrhea, constipation, pain near the anus) TENDERNESS IN MOUTH AND THROAT WITH OR WITHOUT PRESENCE OF ULCERS (sore throat, sores in mouth, or a toothache) UNUSUAL RASH, SWELLING OR PAIN  UNUSUAL VAGINAL DISCHARGE OR ITCHING   Items with * indicate a potential emergency and should be followed up as soon as possible or go to the Emergency Department if any problems should occur.  Please show the CHEMOTHERAPY ALERT CARD or IMMUNOTHERAPY ALERT CARD at check-in to  the Emergency Department and triage nurse.  Should you have questions after your visit or need to cancel or reschedule your appointment, please contact [facility]  Dept: [phone]  and follow the prompts.  Office hours are 8:00 a.m. to 4:30 p.m. Monday - Friday. Please note that voicemails left after 4:00 p.m. may not be returned until the following business day.  We are closed weekends and major holidays. You have access to a nurse at all times for urgent questions. Please call the main number to the clinic Dept: [phone] and follow the prompts.   For any non-urgent questions, you may also contact your provider using MyChart. We now offer e-Visits for anyone 63 and older to request care online for non-urgent symptoms. For details visit [email or web address].   Also download the MyChart app! Go to the app store, search "MyChart", open the app, select [facility], and log in with your MyChart username and password.  Due to Covid, a mask is required upon entering the hospital/clinic. If you do not have a mask, one will be given to you upon arrival. For doctor visits, patients may have 1 support person aged 59 or older with them. For treatment visits, patients cannot have anyone with them due to current Covid guidelines and our immunocompromised population.   Nivolumab injection What is this medication? NIVOLUMAB (nye VOL ue mab) is a monoclonal antibody. It treats certain types of cancer. Some of the cancers treated are colon cancer, head and neck cancer, Hodgkin lymphoma, lung cancer, and melanoma. This medicine may be used for other purposes; ask your  health care provider or pharmacist if you have questions. COMMON BRAND NAME(S): Opdivo What should I tell my care team before I take this medication? They need to know if you have any of these conditions: Autoimmune diseases such as Crohn's disease, ulcerative colitis, or lupus Have had or planning to have an  allogeneic stem cell transplant (uses someone else's stem cells) History of chest radiation Organ transplant Nervous system problems such as myasthenia gravis or Guillain-Barre syndrome An unusual or allergic reaction to nivolumab, other medicines, foods, dyes, or preservatives Pregnant or trying to get pregnant Breast-feeding How should I use this medication? This medication is injected into a vein. It is given in a hospital or clinic setting. A special MedGuide will be given to you before each treatment. Be sure to read this information carefully each time. Talk to your care team regarding the use of this medication in children. While it may be prescribed for children as young as 12 years for selected conditions, precautions do apply. Overdosage: If you think you have taken too much of this medicine contact a poison control center or emergency room at once. NOTE: This medicine is only for you. Do not share this medicine with others. What if I miss a dose? Keep appointments for follow-up doses. It is important not to miss your dose. Call your care team if you are unable to keep an appointment. What may interact with this medication? Interactions have not been studied. This list may not describe all possible interactions. Give your health care provider a list of all the medicines, herbs, non-prescription drugs, or dietary supplements you use. Also tell them if you smoke, drink alcohol, or use illegal drugs. Some items may interact with your medicine. What should I watch for while using this medication? Your condition will be monitored carefully while you are receiving this medication. You may need blood work done while you are taking this medication. Do not become pregnant while taking this medication or for 5 months after stopping it. Women should inform their care team if they wish to become pregnant or think they might be pregnant. There is a potential for serious harm to an unborn child.  Talk to your care team for more information. Do not breast-feed an infant while taking this medication or for 5 months after stopping it. What side effects may I notice from receiving this medication? Side effects that you should report to your care team as soon as possible: Allergic reactions--skin rash, itching, hives, swelling of the face, lips, tongue, or throat Bloody or black, tar-like stools Change in vision Chest pain Diarrhea Dry cough, shortness of breath or trouble breathing Eye pain Fast or irregular heartbeat Fever, chills High blood sugar (hyperglycemia)--increased thirst or amount of urine, unusual weakness or fatigue, blurry vision High thyroid levels (hyperthyroidism)--fast or irregular heartbeat, weight loss, excessive sweating or sensitivity to heat, tremors or shaking, anxiety, nervousness, irregular menstrual cycle or spotting Kidney injury--decrease in the amount of urine, swelling of the ankles, hands, or feet Liver injury--right upper belly pain, loss of appetite, nausea, light-colored stool, dark yellow or brown urine, yellowing skin or eyes, unusual weakness or fatigue Low red blood cell count--unusual weakness or fatigue, dizziness, headache, trouble breathing Low thyroid levels (hypothyroidism)--unusual weakness or fatigue, increased sensitivity to cold, constipation, hair loss, dry skin, weight gain, feelings of depression Mood and behavior changes-confusion, change in sex drive or performance, irritability Muscle pain or cramps Pain, tingling, or numbness in the hands or feet, muscle weakness, trouble  walking, loss of balance or coordination Red or dark brown urine Redness, blistering, peeling, or loosening of the skin, including inside the mouth Stomach pain Unusual bruising or bleeding Side effects that usually do not require medical attention (report to your care team if they continue or are bothersome): Bone pain Constipation Loss of  appetite Nausea Tiredness Vomiting This list may not describe all possible side effects. Call your doctor for medical advice about side effects. You may report side effects to FDA at 1-800-FDA-1088. Where should I keep my medication? This medication is given in a hospital or clinic and will not be stored at home. NOTE: This sheet is a summary. It may not cover all possible information. If you have questions about this medicine, talk to your doctor, pharmacist, or health care provider.  2022 Elsevier/Gold Standard (2021-05-21 00:00:00)

## 2022-02-26 ENCOUNTER — Other Ambulatory Visit (HOSPITAL_BASED_OUTPATIENT_CLINIC_OR_DEPARTMENT_OTHER): Payer: Medicare Other

## 2022-02-27 ENCOUNTER — Telehealth: Payer: Self-pay | Admitting: *Deleted

## 2022-02-27 NOTE — Telephone Encounter (Signed)
-----   Message from Wyatt Portela, MD sent at 02/27/2022  8:10 AM EDT ----- Please let him know his MRI is good. No cancer or anything to explain his pain

## 2022-02-27 NOTE — Telephone Encounter (Signed)
LM to call Dr Shadad's nurse °

## 2022-03-03 DIAGNOSIS — I10 Essential (primary) hypertension: Secondary | ICD-10-CM | POA: Diagnosis not present

## 2022-03-03 DIAGNOSIS — E7849 Other hyperlipidemia: Secondary | ICD-10-CM | POA: Diagnosis not present

## 2022-03-03 DIAGNOSIS — E1169 Type 2 diabetes mellitus with other specified complication: Secondary | ICD-10-CM | POA: Diagnosis not present

## 2022-03-03 DIAGNOSIS — E782 Mixed hyperlipidemia: Secondary | ICD-10-CM | POA: Diagnosis not present

## 2022-03-04 ENCOUNTER — Other Ambulatory Visit: Payer: Self-pay | Admitting: Oncology

## 2022-03-05 ENCOUNTER — Other Ambulatory Visit: Payer: Self-pay

## 2022-03-05 ENCOUNTER — Encounter (HOSPITAL_COMMUNITY): Payer: Self-pay

## 2022-03-05 ENCOUNTER — Emergency Department (HOSPITAL_COMMUNITY)
Admission: EM | Admit: 2022-03-05 | Discharge: 2022-03-05 | Disposition: A | Discharge status: Home / Self Care | Payer: Medicare Other | Attending: Emergency Medicine | Admitting: Emergency Medicine

## 2022-03-05 ENCOUNTER — Emergency Department (HOSPITAL_COMMUNITY): Payer: Medicare Other

## 2022-03-05 DIAGNOSIS — Z79899 Other long term (current) drug therapy: Secondary | ICD-10-CM | POA: Diagnosis not present

## 2022-03-05 DIAGNOSIS — R739 Hyperglycemia, unspecified: Secondary | ICD-10-CM | POA: Insufficient documentation

## 2022-03-05 DIAGNOSIS — K4090 Unilateral inguinal hernia, without obstruction or gangrene, not specified as recurrent: Secondary | ICD-10-CM | POA: Insufficient documentation

## 2022-03-05 DIAGNOSIS — R1084 Generalized abdominal pain: Secondary | ICD-10-CM | POA: Diagnosis not present

## 2022-03-05 DIAGNOSIS — R590 Localized enlarged lymph nodes: Secondary | ICD-10-CM | POA: Insufficient documentation

## 2022-03-05 DIAGNOSIS — Z7984 Long term (current) use of oral hypoglycemic drugs: Secondary | ICD-10-CM | POA: Diagnosis not present

## 2022-03-05 DIAGNOSIS — K5900 Constipation, unspecified: Secondary | ICD-10-CM | POA: Insufficient documentation

## 2022-03-05 DIAGNOSIS — R599 Enlarged lymph nodes, unspecified: Secondary | ICD-10-CM

## 2022-03-05 DIAGNOSIS — N400 Enlarged prostate without lower urinary tract symptoms: Secondary | ICD-10-CM | POA: Diagnosis not present

## 2022-03-05 DIAGNOSIS — I10 Essential (primary) hypertension: Secondary | ICD-10-CM | POA: Diagnosis not present

## 2022-03-05 DIAGNOSIS — R109 Unspecified abdominal pain: Secondary | ICD-10-CM | POA: Diagnosis not present

## 2022-03-05 LAB — COMPREHENSIVE METABOLIC PANEL
ALT: 12 U/L (ref 0–44)
AST: 13 U/L — ABNORMAL LOW (ref 15–41)
Albumin: 3.9 g/dL (ref 3.5–5.0)
Alkaline Phosphatase: 110 U/L (ref 38–126)
Anion gap: 9 (ref 5–15)
BUN: 26 mg/dL — ABNORMAL HIGH (ref 8–23)
CO2: 26 mmol/L (ref 22–32)
Calcium: 9.7 mg/dL (ref 8.9–10.3)
Chloride: 101 mmol/L (ref 98–111)
Creatinine, Ser: 1.53 mg/dL — ABNORMAL HIGH (ref 0.61–1.24)
GFR, Estimated: 47 mL/min — ABNORMAL LOW (ref >60)
Glucose, Bld: 342 mg/dL — ABNORMAL HIGH (ref 70–99)
Potassium: 4.6 mmol/L (ref 3.5–5.1)
Sodium: 136 mmol/L (ref 135–145)
Total Bilirubin: 0.6 mg/dL (ref 0.3–1.2)
Total Protein: 8 g/dL (ref 6.5–8.1)

## 2022-03-05 LAB — CBC WITH DIFFERENTIAL/PLATELET
Abs Immature Granulocytes: 0.02 10*3/uL (ref 0.00–0.07)
Basophils Absolute: 0 10*3/uL (ref 0.0–0.1)
Basophils Relative: 1 %
Eosinophils Absolute: 0.3 10*3/uL (ref 0.0–0.5)
Eosinophils Relative: 4 %
HCT: 40.4 % (ref 39.0–52.0)
Hemoglobin: 13.5 g/dL (ref 13.0–17.0)
Immature Granulocytes: 0 %
Lymphocytes Relative: 16 %
Lymphs Abs: 1.2 10*3/uL (ref 0.7–4.0)
MCH: 29.9 pg (ref 26.0–34.0)
MCHC: 33.4 g/dL (ref 30.0–36.0)
MCV: 89.4 fL (ref 80.0–100.0)
Monocytes Absolute: 0.6 10*3/uL (ref 0.1–1.0)
Monocytes Relative: 7 %
Neutro Abs: 5.7 10*3/uL (ref 1.7–7.7)
Neutrophils Relative %: 72 %
Platelets: 282 10*3/uL (ref 150–400)
RBC: 4.52 MIL/uL (ref 4.22–5.81)
RDW: 13.7 % (ref 11.5–15.5)
WBC: 7.9 10*3/uL (ref 4.0–10.5)
nRBC: 0 % (ref 0.0–0.2)

## 2022-03-05 LAB — LIPASE, BLOOD: Lipase: 60 U/L — ABNORMAL HIGH (ref 11–51)

## 2022-03-05 IMAGING — CT CT ABD-PELV W/ CM
2 of 5 series · 14 of 46 positions shown, 16 images · IV contrast (agent unspecified)
Comparison: [DATE]

CLINICAL DATA: Diffuse abdominal pain and constipation, history of
prior ureteral malignancy

EXAM:
CT ABDOMEN AND PELVIS WITH CONTRAST
TECHNIQUE: Multidetector CT imaging of the abdomen and pelvis was performed
using the standard protocol following bolus administration of
intravenous contrast.

[Series 2: axial st · axial · 0.81mm/px · z∈[+1120,+1580]mm · 11 of 108 slices shown, 13 images]
[im 8/108  soft-tissue]
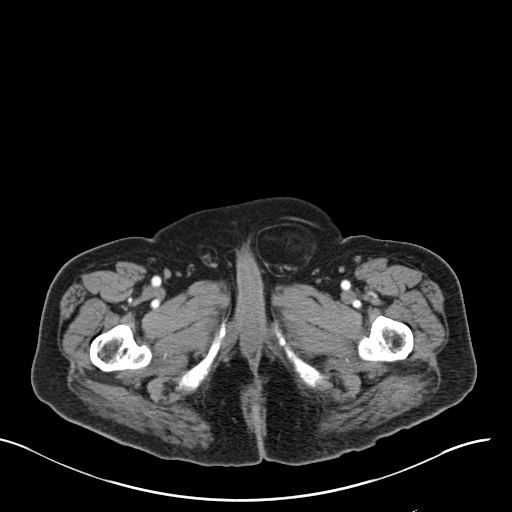
[im 8/108  bone]
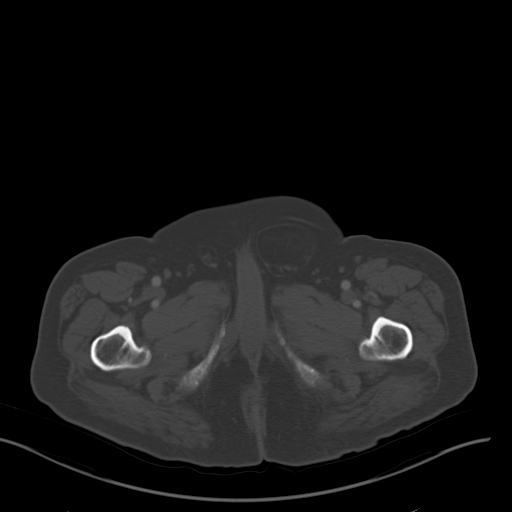
[im 16/108  soft-tissue]
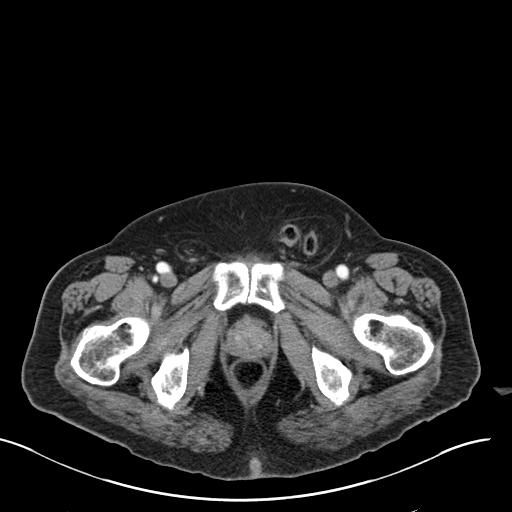
[im 23/108  soft-tissue]
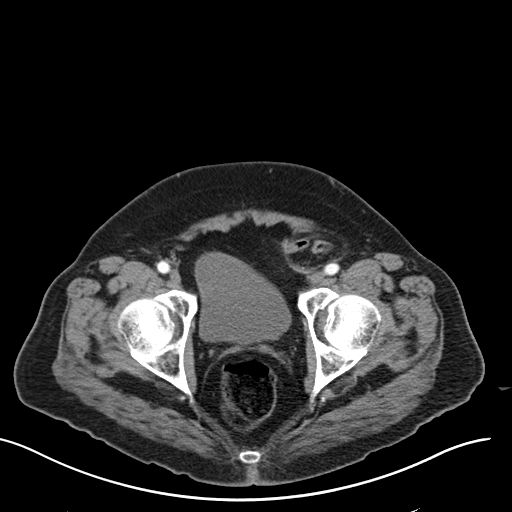
[im 39/108  soft-tissue]
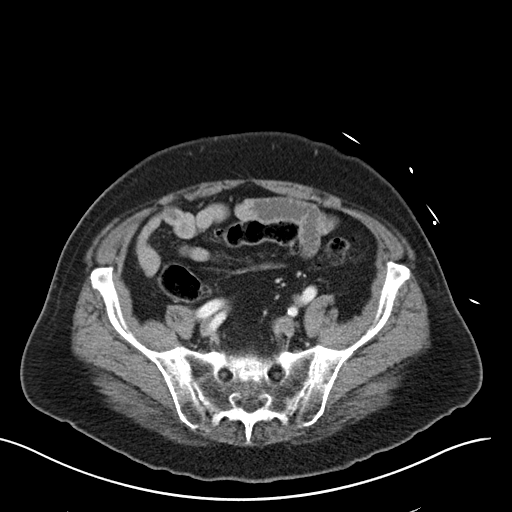
[im 46/108  soft-tissue]
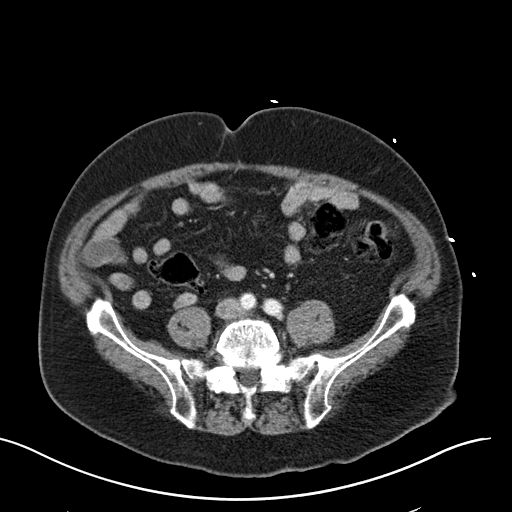
[im 54/108  soft-tissue]
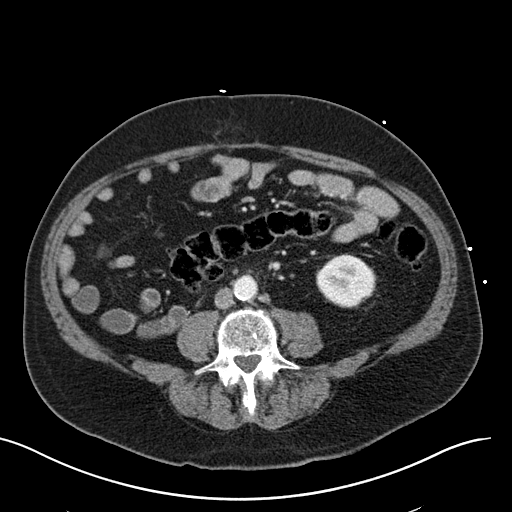
[im 62/108  soft-tissue]
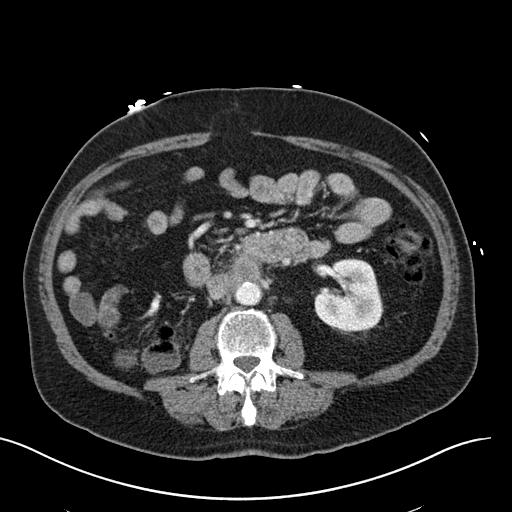
[im 69/108  soft-tissue]
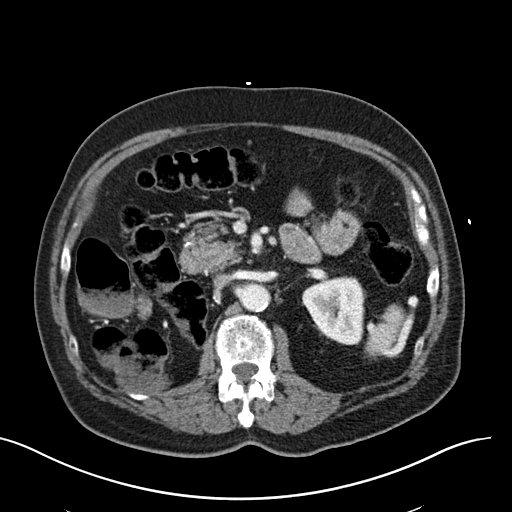
[im 85/108  soft-tissue]
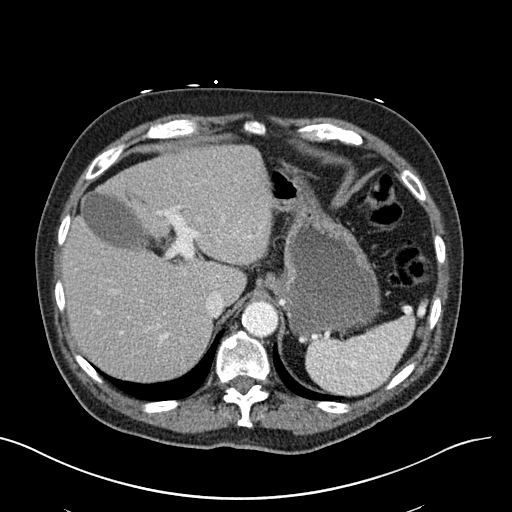
[im 85/108  bone]
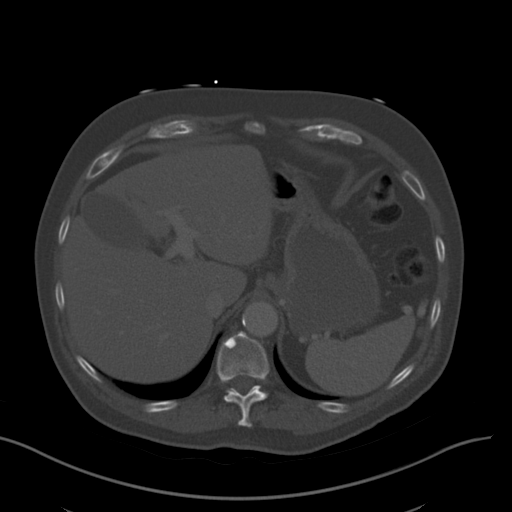
[im 92/108  soft-tissue]
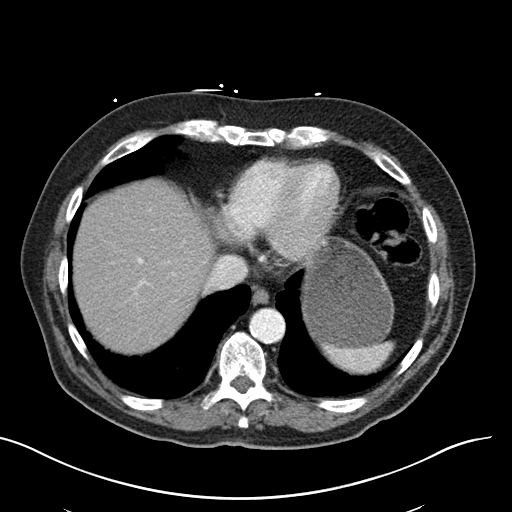
[im 100/108  soft-tissue]
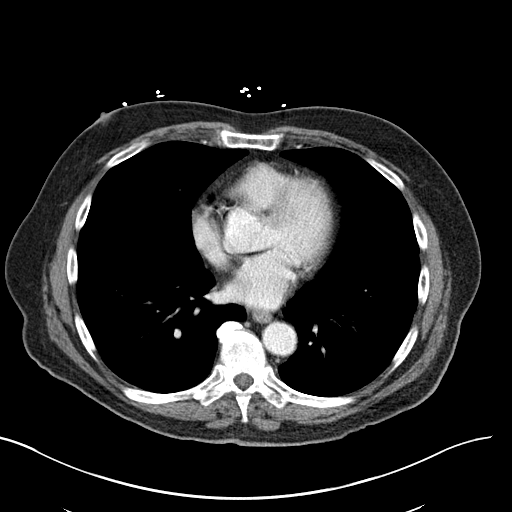

[Series 5: coronal st · coronal · 0.73mm/px · 3 of 176 slices shown]
[im 59/176  soft-tissue]
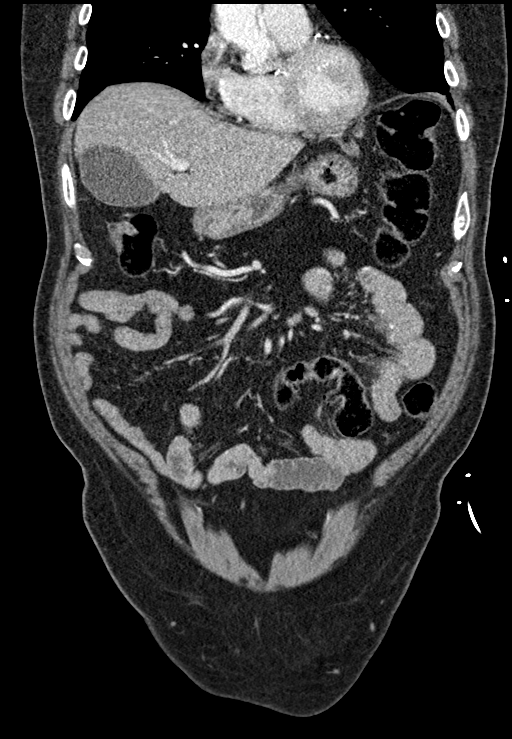
[im 78/176  soft-tissue]
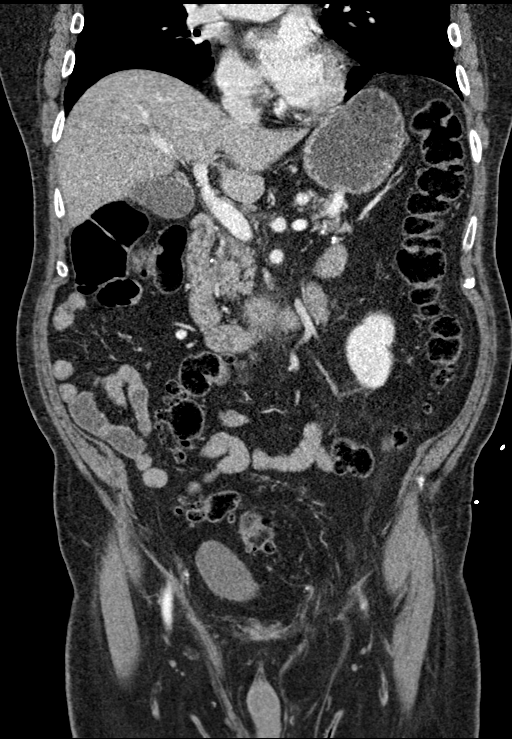
[im 98/176  soft-tissue]
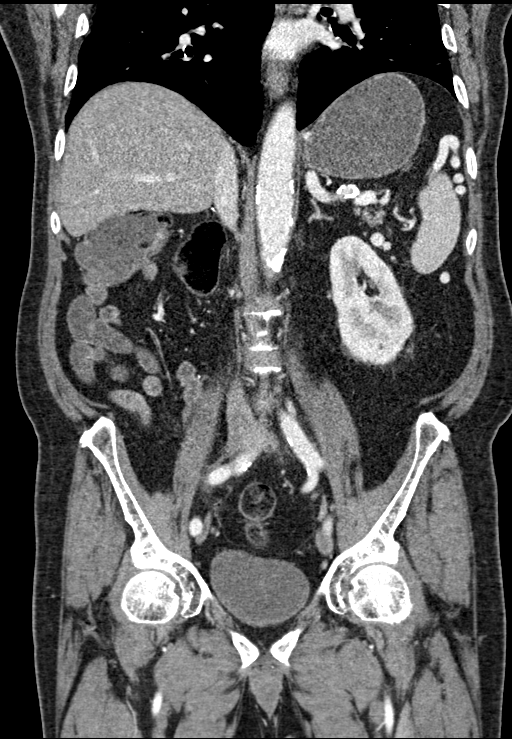

[14 of 46 positions shown; findings below may reference images not displayed]

RADIATION DOSE REDUCTION: This exam was performed according to the
departmental dose-optimization program which includes automated
exposure control, adjustment of the mA and/or kV according to
patient size and/or use of iterative reconstruction technique.

CONTRAST:  80mL OMNIPAQUE IOHEXOL 300 MG/ML  SOLN
FINDINGS: Lower chest: Lung bases show no focal infiltrate or sizable
effusion.

Hepatobiliary: Liver is within normal limits. Gallbladder is well
distended with multiple dependent gallstones.

Pancreas: Pancreas demonstrates multiple calcifications consistent
with chronic pancreatitis. No focal mass or acute abnormality is
noted.

Spleen: Normal in size without focal abnormality.

Adrenals/Urinary Tract: Right adrenal gland and right kidney have
been surgically removed. No soft tissue is noted in the surgical
bed. Left adrenal gland and left kidney are within normal limits. No
obstructive changes are seen. The bladder is partially distended.

Stomach/Bowel: Left inguinal hernia is noted containing a loop of
sigmoid colon although no obstructive changes are seen. Scattered
fecal material is noted consistent with a mild degree of
constipation. Mild diverticular changes noted without evidence of
diverticulitis. The appendix is within normal limits. Stomach and
small bowel are unremarkable.

Vascular/Lymphatic: Aortic atherosclerotic changes are noted. There
is a persistent pre aortic lymph node with mild inflammatory changes
identified and central necrosis. These changes are better visualized
on the current exam due to contrast administration. The node
measures approximately 2.1 x 2.3 cm in greatest AP and transverse
dimensions respectively. This is slightly larger than that seen on
the prior exam. No other significant lymphadenopathy is noted.

Reproductive: Uterus and bilateral adnexa are unremarkable.

Other: Left inguinal hernia is noted containing fat and loop of
sigmoid colon. No free fluid or free air is noted.

Musculoskeletal: Degenerative changes of lumbar spine are noted.
IMPRESSION: Status post right nephrectomy and right adrenalectomy. No recurrent
soft tissue changes are seen. Persistent preaortic lymph node is
noted slightly larger than that seen on the most recent exam
consistent with nodal metastatic disease.

Changes of mild constipation.  No obstructive changes are seen.

Left inguinal hernia which contains both fat and a loop of sigmoid
colon without obstructive change. This is stable from the recent
exam.

Cholelithiasis without complicating factors.

Changes of prior chronic pancreatitis.

## 2022-03-05 MED ORDER — HYDROMORPHONE HCL 2 MG PO TABS: 2.0000 mg | ORAL_TABLET | Freq: Four times a day (QID) | ORAL | 0 refills | Status: DC | PRN

## 2022-03-05 MED ORDER — IOHEXOL 300 MG/ML  SOLN
80.0000 mL | Freq: Once | INTRAMUSCULAR | Status: AC | PRN
  Administered 2022-03-05: 80 mL via INTRAVENOUS

## 2022-03-05 MED ORDER — HYDROMORPHONE HCL 1 MG/ML IJ SOLN
1.0000 mg | Freq: Once | INTRAMUSCULAR | Status: AC
  Administered 2022-03-05: 1 mg via INTRAVENOUS
  Filled 2022-03-05: qty 1

## 2022-03-05 MED ORDER — SENNOSIDES-DOCUSATE SODIUM 8.6-50 MG PO TABS: 1.0000 | ORAL_TABLET | Freq: Every day | ORAL | 0 refills | Status: DC

## 2022-03-05 MED ORDER — ONDANSETRON HCL 4 MG/2ML IJ SOLN
4.0000 mg | Freq: Once | INTRAMUSCULAR | Status: AC
  Administered 2022-03-05: 4 mg via INTRAVENOUS
  Filled 2022-03-05: qty 2

## 2022-03-05 MED ORDER — BISACODYL 10 MG RE SUPP: 10.0000 mg | Freq: Every day | RECTAL | 0 refills | Status: DC | PRN

## 2022-03-05 MED ORDER — SODIUM CHLORIDE 0.9 % IV BOLUS
1000.0000 mL | Freq: Once | INTRAVENOUS | Status: AC
  Administered 2022-03-05: 1000 mL via INTRAVENOUS

## 2022-03-05 MED ORDER — FLEET ENEMA 7-19 GM/118ML RE ENEM: 1.0000 | ENEMA | Freq: Every day | RECTAL | 2 refills | Status: DC | PRN

## 2022-03-05 NOTE — ED Provider Triage Note (Signed)
Emergency Medicine Provider Triage Evaluation Note  Hector Lopez , a 76 y.o. male  was evaluated in triage.  Pt complains of constipation over the last 3 weeks.  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  Stated yesterday one of the testicles was "5 times the size of the other".  Concerned management of his hernia.  Tried drinking an entire bottle of citrate 2 hours before coming to the ED without relief yet.  Review of Systems  Positive:  Negative: See above  Physical Exam  BP (!) 163/91 (BP Location: Left Arm)   Pulse 100   Temp 98.2 F (36.8 C) (Oral)   Resp 16   Ht '5\' 10"'$  (1.778 m)   Wt 83.9 kg   SpO2 93%   BMI 26.54 kg/m  Gen:   Awake, no distress   Resp:  Normal effort  MSK:   Moves extremities without difficulty  Other:  Mild left flank pain.  Abdomen soft nontender.  Medical Decision Making  Medically screening exam initiated at 7:32 PM.  Appropriate orders placed.  Hector Lopez was informed that the remainder of the evaluation will be completed by another provider, this initial triage assessment does not replace that evaluation, and the importance of remaining in the ED until their evaluation is complete.     Hector Rome, PA-C 28/76/81 [id number]

## 2022-03-05 NOTE — ED Provider Notes (Signed)
[place] DEPT Provider Note   CSN: [id number] Arrival date & time: 03/05/22  1912     History  Chief Complaint  Patient presents with   Constipation   Back Pain   Abdominal Pain    Hector Lopez is a 76 y.o. male present emerged department with back pain and abdominal pain.  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  He feels constipated.  He feels nauseated.  He is passing gas.  He tried magnesium citrate today with no bowel movement, and has used "all the laxatives and stool softeners".  He does take multiple pain medications including first tramadol and then Norco for his worsening abdominal pain.  He had an MRI of the lumbar thoracic spine performed approximately week ago by his oncologist which showed a chronic T12 and L4 compression fracture, which he was unaware of.  He wonders whether this may be the cause of his pain.  He also has a history of an inguinal hernia that was noted on CT of the abdomen pelvis, most recently performed in May, 1 month ago.  He has noted since last night feeling some swelling in the scrotum which is since gone down.  HPI     Home Medications Prior to Admission medications   Medication Sig Start Date End Date Taking? Authorizing Provider  bisacodyl (DULCOLAX) 10 MG suppository Place 1 suppository (10 mg total) rectally daily as needed for up to 12 doses for moderate constipation. 03/05/22  Yes Naiomy Watters, Carola Rhine, MD  HYDROmorphone (DILAUDID) 2 MG tablet Take 1 tablet (2 mg total) by mouth every 6 (six) hours as needed for up to 15 doses for severe pain. 03/05/22  Yes Lyna Laningham, Carola Rhine, MD  senna-docusate (SENOKOT-S) 8.6-50 MG tablet  Take 1 tablet by mouth daily. 03/05/22  Yes Magalie Almon, Carola Rhine, MD  sodium phosphate (FLEET) 7-19 GM/118ML ENEM Place 133 mLs (1 enema total) rectally daily as needed for up to 1 dose for severe constipation. 03/05/22  Yes Wyvonnia Dusky, MD  amLODipine (NORVASC) 10 MG tablet Take 10 mg by mouth at bedtime.     [provider]  cetirizine (ZYRTEC) 10 MG tablet Take 10 mg by mouth daily.    [provider]  finasteride (PROSCAR) 5 MG tablet Take 5 mg by mouth daily.    [provider]  glipiZIDE (GLUCOTROL) 10 MG tablet Take 10 mg by mouth daily before breakfast.     [provider]  HYDROcodone-acetaminophen (NORCO) 5-325 MG tablet Take 1 tablet by mouth every 6 (six) hours as needed for moderate pain. 02/25/22   Wyatt Portela, MD  hydrOXYzine (ATARAX) 10 MG tablet Take 1 tablet (10 mg total) by mouth 3 (three) times daily as needed. 12/24/21   Wyatt Portela, MD  lidocaine-prilocaine (EMLA) cream Apply 1 application topically as needed. 06/20/20   Wyatt Portela, MD  lisinopril (ZESTRIL) 40 MG tablet Take 40 mg by mouth at bedtime.     [provider]  metFORMIN (GLUCOPHAGE) 1000 MG tablet Take 1,000 mg by mouth 2 (two) times daily with a meal.     [provider]  omeprazole (PRILOSEC) 40 MG capsule Take 40 mg by mouth daily.  [provider]  prochlorperazine (COMPAZINE) 10 MG tablet Take 1 tablet (10 mg total) by mouth every 6 (six) hours as needed for nausea or vomiting. 10/17/21   Wyatt Portela, MD  senna-docusate (SENOKOT-S) 8.6-50 MG tablet Take 1 tablet by mouth 2 (two) times daily. While taking strong pain meds to prevent cosntipation. 01/20/20   Alexis Frock, MD  sertraline (ZOLOFT) 50 MG tablet Take 50 mg by mouth daily.    [provider]  simvastatin (ZOCOR) 20 MG tablet Take 20 mg by mouth daily.     [provider]  sulfamethoxazole-trimethoprim (BACTRIM DS) 800-160 MG tablet Take 1 tablet by mouth  daily. Start taking one day prior to your appointment for your first follow-up and catheter removal.  Continue taking for three days. Patient not taking: Reported on 06/20/2020 04/14/20   Ardis Hughs, MD  traMADol (ULTRAM) 50 MG tablet Take 1 tablet (50 mg total) by mouth every 6 (six) hours as needed. 02/19/22   Wyatt Portela, MD  triamcinolone cream (KENALOG) 0.1 % Apply 1 application. topically 2 (two) times daily. 12/24/21   Wyatt Portela, MD      Allergies    Codeine    Review of Systems   Review of Systems  Physical Exam Updated Vital Signs BP (!) 155/87 (BP Location: Right Arm)   Pulse 83   Temp 98.2 F (36.8 C) (Oral)   Resp 16   Ht '5\' 10"'$  (1.778 m)   Wt 83.9 kg   SpO2 96%   BMI 26.54 kg/m  Physical Exam Constitutional:      General: He is not in acute distress. HENT:     Head: Normocephalic and atraumatic.  Eyes:     Conjunctiva/sclera: Conjunctivae normal.     Pupils: Pupils are equal, round, and reactive to light.  Cardiovascular:     Rate and Rhythm: Normal rate and regular rhythm.  Pulmonary:     Effort: Pulmonary effort is normal. No respiratory distress.  Abdominal:     General: There is no distension.     Tenderness: There is no abdominal tenderness.  Genitourinary:    Comments: GU exam with normal testes, no testicular tenderness, no palpable inguinal hernia Skin:    General: Skin is warm and dry.  Neurological:     General: No focal deficit present.     Mental Status: He is alert. Mental status is at baseline.  Psychiatric:        Mood and Affect: Mood normal.        Behavior: Behavior normal.     ED Results / Procedures / Treatments   Labs (all labs ordered are listed, but only abnormal results are displayed) Labs Reviewed  COMPREHENSIVE METABOLIC PANEL - Abnormal; Notable for the following components:      Result Value   Glucose, Bld 342 (*)    BUN 26 (*)    Creatinine, Ser 1.53 (*)    AST 13 (*)    GFR, Estimated 47 (*)    All  other components within normal limits  LIPASE, BLOOD - Abnormal; Notable for the following components:   Lipase 60 (*)    All other components within normal limits  CBC WITH DIFFERENTIAL/PLATELET  URINALYSIS, ROUTINE W REFLEX MICROSCOPIC    EKG EKG Interpretation  Date/Time:  Wednesday March 05 2022 21:57:11 EDT Ventricular Rate:  89 PR Interval:  173 QRS Duration: 89 QT Interval:  354 QTC Calculation: 431 R Axis:   80 Text Interpretation:  Sinus rhythm Confirmed by Octaviano Glow [phone]) on 03/05/2022 10:56:02 PM  Radiology CT ABDOMEN PELVIS W CONTRAST  Result Date: 03/05/2022 CLINICAL DATA:  Diffuse abdominal pain and constipation, history of prior ureteral malignancy EXAM: CT ABDOMEN AND PELVIS WITH CONTRAST TECHNIQUE: Multidetector CT imaging of the abdomen and pelvis was performed using the standard protocol following bolus administration of intravenous contrast. RADIATION DOSE REDUCTION: This exam was performed according to the departmental dose-optimization program which includes automated exposure control, adjustment of the mA and/or kV according to patient size and/or use of iterative reconstruction technique. CONTRAST:  44m OMNIPAQUE IOHEXOL 300 MG/ML  SOLN COMPARISON:  02/07/2022 FINDINGS: Lower chest: Lung bases show no focal infiltrate or sizable effusion. Hepatobiliary: Liver is within normal limits. Gallbladder is well distended with multiple dependent gallstones. Pancreas: Pancreas demonstrates multiple calcifications consistent with chronic pancreatitis. No focal mass or acute abnormality is noted. Spleen: Normal in size without focal abnormality. Adrenals/Urinary Tract: Right adrenal gland and right kidney have been surgically removed. No soft tissue is noted in the surgical bed. Left adrenal gland and left kidney are within normal limits. No obstructive changes are seen. The bladder is partially distended. Stomach/Bowel: Left inguinal hernia is noted containing a loop of  sigmoid colon although no obstructive changes are seen. Scattered fecal material is noted consistent with a mild degree of constipation. Mild diverticular changes noted without evidence of diverticulitis. The appendix is within normal limits. Stomach and small bowel are unremarkable. Vascular/Lymphatic: Aortic atherosclerotic changes are noted. There is a persistent pre aortic lymph node with mild inflammatory changes identified and central necrosis. These changes are better visualized on the current exam due to contrast administration. The node measures approximately 2.1 x 2.3 cm in greatest AP and transverse dimensions respectively. This is slightly larger than that seen on the prior exam. No other significant lymphadenopathy is noted. Reproductive: Uterus and bilateral adnexa are unremarkable. Other: Left inguinal hernia is noted containing fat and loop of sigmoid colon. No free fluid or free air is noted. Musculoskeletal: Degenerative changes of lumbar spine are noted. IMPRESSION: Status post right nephrectomy and right adrenalectomy. No recurrent soft tissue changes are seen. Persistent preaortic lymph node is noted slightly larger than that seen on the most recent exam consistent with nodal metastatic disease. Changes of mild constipation.  No obstructive changes are seen. Left inguinal hernia which contains both fat and a loop of sigmoid colon without obstructive change. This is stable from the recent exam. Cholelithiasis without complicating factors. Changes of prior chronic pancreatitis. Electronically Signed   By: MInez CatalinaM.D.   On: 03/05/2022 21:44    Procedures Procedures    Medications Ordered in ED Medications  HYDROmorphone (DILAUDID) injection 1 mg (1 mg Intravenous Given 03/05/22 2110)  sodium chloride 0.9 % bolus 1,000 mL (0 mLs Intravenous Stopped 03/05/22 2306)  iohexol (OMNIPAQUE) 300 MG/ML solution 80 mL (80 mLs Intravenous Contrast Given 03/05/22 2127)  ondansetron (ZOFRAN)  injection 4 mg (4 mg Intravenous Given 03/05/22 2118)    ED Course/ Medical Decision Making/ A&P Clinical Course as of 03/05/22 2344  Wed Mar 05, 2022  2240 Patient reports significant improvement of his pain with the IV Dilaudid.  His CT scan work-up shows some potential causes of his pain, which would include inguinal hernia (not incarcerated, no bowel obstruction) versus functional constipation versus compression fractures versus para-aortic lymph node.  I can provide him a brief prescription for oral Dilaudid, which seems to be working better than the tramadol at home.  Advised to him and his family that he not mix his opiates, stick to one for now.  I also do see some stool near the rectal vault, and recommended he try suppositories or an enema, given the option of treatment in the hospital and observation overnight versus home treatment he prefers to go home.  His wife and daughter will take him home. [MT]  2251 Hernia reassessed remains soft at bedside exam, appears to be reducible.  I discussed with the patient and his family how to continue applying cold compresses and reducing as needed at home, although he has no tenderness whatsoever down here.  I do not suspect this is incarcerated, but it may be contributing to his constipation.  Referral provided for general surgery [MT]    Clinical Course User Index [MT] [place] Near[place], Carola Rhine, MD                           Medical Decision Making Amount and/or Complexity of Data Reviewed Radiology: ordered.  Risk OTC drugs. Prescription drug management.   This patient presents to the ED with concern for abdominal pain, scrotal swelling, constipation. This involves an extensive number of treatment options, and is a complaint that carries with it a high risk of complications and morbidity.  The differential diagnosis includes functional constipation from opiate use versus colitis versus hernia versus new metastatic disease versus referred pain from  compression fractures of the spine versus other  Co-morbidities that complicate the patient evaluation: History of malignancy now on treatment raises concern for recurring malignancies. History of hernia noted on prior scans raises concern for strangulated hernia  Additional history obtained from patient's daughter and wife at bedside  External records from outside source obtained and reviewed including MRI scan 1 week ago, CT scan of the abdomen from May 2023  I ordered and personally interpreted labs.  The pertinent results include: Mild hyperglycemia, creatinine near baseline level.  No leukocytosis or anemia  I ordered imaging studies including CT abdomen pelvis I independently visualized and interpreted imaging which showed left inguinal hernia without evidence of incarceration, no evidence of bowel obstruction, chronic compression fractures, periaortic lymph nodes I agree with the radiologist interpretation  I ordered medication including IV Dilaudid for pain control, IV fluids for hydration prior to his contrast scan.  IV Zofran for nausea I have reviewed the patients home medicines and have made adjustments as needed   After the interventions noted above, I reevaluated the patient and found that they have: improved  Dispostion:  After consideration of the diagnostic results and the patients response to treatment, I feel that the patent would benefit from close outpatient follow-up         Final Clinical Impression(s) / ED Diagnoses Final diagnoses:  Generalized abdominal pain  Constipation, unspecified constipation type  Lymph node enlargement  Left inguinal hernia    Rx / DC Orders ED Discharge Orders          Ordered    HYDROmorphone (DILAUDID) 2 MG tablet  Every 6 hours PRN        03/05/22 2251    bisacodyl (DULCOLAX) 10 MG suppository  Daily PRN        03/05/22 2251    sodium phosphate (FLEET) 7-19 GM/118ML ENEM  Daily PRN        03/05/22 2251     senna-docusate (SENOKOT-S) 8.6-50 MG tablet  Daily        03/05/22 2251  Wyvonnia Dusky, MD 03/05/22 [phone]

## 2022-03-05 NOTE — Discharge Instructions (Addendum)
I recommend you call to schedule follow-up appointment with the general surgeon for your left inguinal hernia.  You should lie yourself flat and gently massage to this region as shown if you begin having pain down here.  You also have issues with constipation.  I recommend that you use stool softeners which I prescribed, as well as rectal suppositories and an enema, to produce a bowel movement at home.  I prescribed a stronger pain medicine called Dilaudid.  This is an opiate medicine.  It can worsen constipation, we discussed this.  If you are needing to take this medication, please do not take tramadol, Norco, or any other opiates at the same time.

## 2022-03-05 NOTE — ED Triage Notes (Addendum)
Pt reports with constipation x 3 weeks. Pt states that his back and abdomen are hurting. Pt also reports that his scrotum is swollen. Pt reports currently doing immuno therapy at the cancer center. Pt states that he took a magnesium citrate 2 hrs ago.

## 2022-03-06 DIAGNOSIS — S22000A Wedge compression fracture of unspecified thoracic vertebra, initial encounter for closed fracture: Secondary | ICD-10-CM | POA: Diagnosis not present

## 2022-03-06 DIAGNOSIS — K4090 Unilateral inguinal hernia, without obstruction or gangrene, not specified as recurrent: Secondary | ICD-10-CM | POA: Diagnosis not present

## 2022-03-06 DIAGNOSIS — C7910 Secondary malignant neoplasm of unspecified urinary organs: Secondary | ICD-10-CM | POA: Diagnosis not present

## 2022-03-06 DIAGNOSIS — E1169 Type 2 diabetes mellitus with other specified complication: Secondary | ICD-10-CM | POA: Diagnosis not present

## 2022-03-06 DIAGNOSIS — Z6826 Body mass index (BMI) 26.0-26.9, adult: Secondary | ICD-10-CM | POA: Diagnosis not present

## 2022-03-06 DIAGNOSIS — C661 Malignant neoplasm of right ureter: Secondary | ICD-10-CM | POA: Diagnosis not present

## 2022-03-06 DIAGNOSIS — S32000A Wedge compression fracture of unspecified lumbar vertebra, initial encounter for closed fracture: Secondary | ICD-10-CM | POA: Diagnosis not present

## 2022-03-06 DIAGNOSIS — R1084 Generalized abdominal pain: Secondary | ICD-10-CM | POA: Diagnosis not present

## 2022-03-10 ENCOUNTER — Encounter (HOSPITAL_COMMUNITY): Payer: Self-pay

## 2022-03-10 ENCOUNTER — Emergency Department (HOSPITAL_COMMUNITY)
Admission: EM | Admit: 2022-03-10 | Discharge: 2022-03-10 | Discharge status: Against Medical Advice | Payer: Medicare Other | Attending: Emergency Medicine | Admitting: Emergency Medicine

## 2022-03-10 ENCOUNTER — Other Ambulatory Visit: Payer: Self-pay

## 2022-03-10 DIAGNOSIS — Z5321 Procedure and treatment not carried out due to patient leaving prior to being seen by health care provider: Secondary | ICD-10-CM | POA: Insufficient documentation

## 2022-03-10 DIAGNOSIS — R109 Unspecified abdominal pain: Secondary | ICD-10-CM | POA: Insufficient documentation

## 2022-03-10 DIAGNOSIS — M549 Dorsalgia, unspecified: Secondary | ICD-10-CM | POA: Diagnosis not present

## 2022-03-10 LAB — COMPREHENSIVE METABOLIC PANEL
ALT: 12 U/L (ref 0–44)
AST: 11 U/L — ABNORMAL LOW (ref 15–41)
Albumin: 3.9 g/dL (ref 3.5–5.0)
Alkaline Phosphatase: 107 U/L (ref 38–126)
Anion gap: 11 (ref 5–15)
BUN: 19 mg/dL (ref 8–23)
CO2: 19 mmol/L — ABNORMAL LOW (ref 22–32)
Calcium: 9.2 mg/dL (ref 8.9–10.3)
Chloride: 104 mmol/L (ref 98–111)
Creatinine, Ser: 1.02 mg/dL (ref 0.61–1.24)
GFR, Estimated: >60 mL/min (ref >60)
Glucose, Bld: 284 mg/dL — ABNORMAL HIGH (ref 70–99)
Potassium: 4.4 mmol/L (ref 3.5–5.1)
Sodium: 134 mmol/L — ABNORMAL LOW (ref 135–145)
Total Bilirubin: 0.8 mg/dL (ref 0.3–1.2)
Total Protein: 7.9 g/dL (ref 6.5–8.1)

## 2022-03-10 LAB — CBC
HCT: 39 % (ref 39.0–52.0)
Hemoglobin: 13 g/dL (ref 13.0–17.0)
MCH: 29.7 pg (ref 26.0–34.0)
MCHC: 33.3 g/dL (ref 30.0–36.0)
MCV: 89.2 fL (ref 80.0–100.0)
Platelets: 287 10*3/uL (ref 150–400)
RBC: 4.37 MIL/uL (ref 4.22–5.81)
RDW: 13.3 % (ref 11.5–15.5)
WBC: 8.5 10*3/uL (ref 4.0–10.5)
nRBC: 0 % (ref 0.0–0.2)

## 2022-03-10 LAB — LIPASE, BLOOD: Lipase: 54 U/L — ABNORMAL HIGH (ref 11–51)

## 2022-03-10 NOTE — ED Triage Notes (Signed)
Ambulatory to ED with c/o back pain that radiates to his stomach. States he was seen last week for similar symptoms.

## 2022-03-12 ENCOUNTER — Emergency Department (HOSPITAL_COMMUNITY): Payer: Medicare Other

## 2022-03-12 ENCOUNTER — Emergency Department (HOSPITAL_COMMUNITY)
Admission: EM | Admit: 2022-03-12 | Discharge: 2022-03-12 | Disposition: A | Discharge status: Home / Self Care | Payer: Medicare Other | Attending: Emergency Medicine | Admitting: Emergency Medicine

## 2022-03-12 ENCOUNTER — Encounter (HOSPITAL_COMMUNITY): Payer: Self-pay | Admitting: Radiology

## 2022-03-12 DIAGNOSIS — K8020 Calculus of gallbladder without cholecystitis without obstruction: Secondary | ICD-10-CM | POA: Diagnosis not present

## 2022-03-12 DIAGNOSIS — K4090 Unilateral inguinal hernia, without obstruction or gangrene, not specified as recurrent: Secondary | ICD-10-CM | POA: Diagnosis not present

## 2022-03-12 DIAGNOSIS — Z8554 Personal history of malignant neoplasm of ureter: Secondary | ICD-10-CM | POA: Diagnosis not present

## 2022-03-12 DIAGNOSIS — R630 Anorexia: Secondary | ICD-10-CM | POA: Insufficient documentation

## 2022-03-12 DIAGNOSIS — R109 Unspecified abdominal pain: Secondary | ICD-10-CM | POA: Diagnosis present

## 2022-03-12 DIAGNOSIS — R110 Nausea: Secondary | ICD-10-CM | POA: Insufficient documentation

## 2022-03-12 DIAGNOSIS — R1084 Generalized abdominal pain: Secondary | ICD-10-CM | POA: Insufficient documentation

## 2022-03-12 DIAGNOSIS — K5730 Diverticulosis of large intestine without perforation or abscess without bleeding: Secondary | ICD-10-CM | POA: Diagnosis not present

## 2022-03-12 DIAGNOSIS — K429 Umbilical hernia without obstruction or gangrene: Secondary | ICD-10-CM | POA: Diagnosis not present

## 2022-03-12 LAB — COMPREHENSIVE METABOLIC PANEL
ALT: 9 U/L (ref 0–44)
AST: 12 U/L — ABNORMAL LOW (ref 15–41)
Albumin: 3.8 g/dL (ref 3.5–5.0)
Alkaline Phosphatase: 104 U/L (ref 38–126)
Anion gap: 8 (ref 5–15)
BUN: 20 mg/dL (ref 8–23)
CO2: 22 mmol/L (ref 22–32)
Calcium: 9.2 mg/dL (ref 8.9–10.3)
Chloride: 101 mmol/L (ref 98–111)
Creatinine, Ser: 1.42 mg/dL — ABNORMAL HIGH (ref 0.61–1.24)
GFR, Estimated: 51 mL/min — ABNORMAL LOW (ref >60)
Glucose, Bld: 288 mg/dL — ABNORMAL HIGH (ref 70–99)
Potassium: 4.8 mmol/L (ref 3.5–5.1)
Sodium: 131 mmol/L — ABNORMAL LOW (ref 135–145)
Total Bilirubin: 0.5 mg/dL (ref 0.3–1.2)
Total Protein: 8.1 g/dL (ref 6.5–8.1)

## 2022-03-12 LAB — CBC WITH DIFFERENTIAL/PLATELET
Abs Immature Granulocytes: 0.05 10*3/uL (ref 0.00–0.07)
Basophils Absolute: 0.1 10*3/uL (ref 0.0–0.1)
Basophils Relative: 1 %
Eosinophils Absolute: 0.2 10*3/uL (ref 0.0–0.5)
Eosinophils Relative: 2 %
HCT: 38.2 % — ABNORMAL LOW (ref 39.0–52.0)
Hemoglobin: 12.8 g/dL — ABNORMAL LOW (ref 13.0–17.0)
Immature Granulocytes: 1 %
Lymphocytes Relative: 14 %
Lymphs Abs: 1.1 10*3/uL (ref 0.7–4.0)
MCH: 29.5 pg (ref 26.0–34.0)
MCHC: 33.5 g/dL (ref 30.0–36.0)
MCV: 88 fL (ref 80.0–100.0)
Monocytes Absolute: 0.5 10*3/uL (ref 0.1–1.0)
Monocytes Relative: 7 %
Neutro Abs: 6.1 10*3/uL (ref 1.7–7.7)
Neutrophils Relative %: 75 %
Platelets: 260 10*3/uL (ref 150–400)
RBC: 4.34 MIL/uL (ref 4.22–5.81)
RDW: 13.3 % (ref 11.5–15.5)
WBC: 8 10*3/uL (ref 4.0–10.5)
nRBC: 0 % (ref 0.0–0.2)

## 2022-03-12 LAB — URINALYSIS, ROUTINE W REFLEX MICROSCOPIC
Bacteria, UA: NONE SEEN
Bilirubin Urine: NEGATIVE
Glucose, UA: 150 mg/dL — AB
Hgb urine dipstick: NEGATIVE
Ketones, ur: 20 mg/dL — AB
Leukocytes,Ua: NEGATIVE
Nitrite: NEGATIVE
Protein, ur: 30 mg/dL — AB
Specific Gravity, Urine: 1.038 — ABNORMAL HIGH (ref 1.005–1.030)
pH: 5 (ref 5.0–8.0)

## 2022-03-12 LAB — LIPASE, BLOOD: Lipase: 28 U/L (ref 11–51)

## 2022-03-12 MED ORDER — LACTATED RINGERS IV BOLUS
500.0000 mL | Freq: Once | INTRAVENOUS | Status: AC
  Administered 2022-03-12: 500 mL via INTRAVENOUS

## 2022-03-12 NOTE — ED Triage Notes (Signed)
Pt c/o ongoing R flank and abdominal pain for three weeks with nausea. Pt has hx of cancer with R nephrectomy. Pt also states he has known gallstone and chronic pancreatitis.

## 2022-03-12 NOTE — ED Provider Triage Note (Signed)
Emergency Medicine Provider Triage Evaluation Note  Hector Lopez , a 76 y.o. male  was evaluated in triage.  Pt complains of bilateral flank pain for three weeks. He was seen one week ago and had imaging and discharged home. 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 Has been taking around the clock narcotics with no relief. States that his primary doctor wanted to admit him for further testing. No urinary symptoms. No fevers  Review of Systems  Positive: Flank pain Negative:   Physical Exam  BP (!) 156/94 (BP Location: Right Arm)   Pulse 97   Temp 98.2 F (36.8 C) (Oral)   Resp 20   SpO2 97%  Gen:   Awake, no distress   Resp:  Normal effort  MSK:   Moves extremities without difficulty  Other:  CVA tenderness only mildly reproducible. No abdominal tenderness.   Medical Decision Making  Medically screening exam initiated at 12:20 PM.  Appropriate orders placed.  Hector Lopez was informed that the remainder of the evaluation will be completed by another provider, this initial triage assessment does not replace that evaluation, and the importance of remaining in the ED until their evaluation is complete.     Hector Lopez, [place] 03/12/22 1222

## 2022-03-12 NOTE — Discharge Instructions (Signed)
Please continue drinking as much fluid as you can Call your urologist, oncologist, and primary care doctor for follow-up No definite acute cause of your symptoms was found today Please return if you are having worsening pain or inability to tolerate liquids.

## 2022-03-12 NOTE — ED Provider Notes (Signed)
[facility] DEPT Provider Note   CSN: [id number] Arrival date & time: 03/12/22  1204     History  Chief Complaint  Patient presents with   Abdominal Pain   Flank Pain    Hector Lopez is a 76 y.o. male.  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  He has had this pain for about 3 to 4 weeks.  He describes the pain as encircling his entire abdomen.  He was seen and evaluated here in the ED by Dr. Festus Barren on 03/05/2022.  He reports he has had ongoing abdominal distention, decreased p.o. intake nausea, no vomiting, or diarrhea.  He denies fever or chills.  Reports that he has an inguinal hernia but has not noted swelling or severe pain there.  He has been taking laxatives and stool softeners.  He had an MRI of his lumbar and thoracic spine performed in mid June which showed T12 and L4 compression fractures which are chronic.  He has not noted specific back pain.  Review of his previous work-up  Home Medications Prior to Admission medications   Medication Sig Start Date End Date Taking? Authorizing Provider  amLODipine (NORVASC) 10 MG tablet Take 10 mg by mouth at bedtime.     [provider]  bisacodyl (DULCOLAX) 10 MG suppository Place 1 suppository (10 mg total) rectally daily as needed for up to 12 doses for moderate constipation. 03/05/22   Wyvonnia Dusky, MD  cetirizine (ZYRTEC) 10 MG tablet Take 10 mg by mouth daily.    [provider]  finasteride (PROSCAR) 5 MG tablet Take 5 mg by mouth daily.    [provider]  glipiZIDE (GLUCOTROL) 10 MG tablet Take 10 mg by mouth daily before breakfast.     [provider]  HYDROcodone-acetaminophen (NORCO) 5-325 MG tablet Take 1 tablet by mouth every 6 (six) hours as needed for moderate pain. 02/25/22   Wyatt Portela, MD  HYDROmorphone (DILAUDID) 2 MG tablet Take  1 tablet (2 mg total) by mouth every 6 (six) hours as needed for up to 15 doses for severe pain. 03/05/22   Wyvonnia Dusky, MD  hydrOXYzine (ATARAX) 10 MG tablet Take 1 tablet (10 mg total) by mouth 3 (three) times daily as needed. 12/24/21   Wyatt Portela, MD  lidocaine-prilocaine (EMLA) cream Apply 1 application topically as needed. 06/20/20   Wyatt Portela, MD  lisinopril (ZESTRIL) 40 MG tablet Take 40 mg by mouth at bedtime.     [provider]  metFORMIN (GLUCOPHAGE) 1000 MG tablet Take 1,000 mg by mouth 2 (two) times daily with a meal.     [provider]  omeprazole (PRILOSEC) 40 MG capsule Take 40 mg by mouth daily.    [provider]  prochlorperazine (COMPAZINE) 10 MG tablet Take 1 tablet (10 mg total) by mouth every 6 (six) hours as needed for nausea or vomiting. 10/17/21   Wyatt Portela, MD  senna-docusate (SENOKOT-S) 8.6-50 MG tablet Take 1 tablet by mouth 2 (two) times daily. While taking strong pain meds to prevent cosntipation. 01/20/20   Alexis Frock, MD  senna-docusate (SENOKOT-S) 8.6-50 MG tablet Take 1 tablet by mouth daily. 03/05/22   Wyvonnia Dusky, MD  sertraline (ZOLOFT) 50 MG tablet Take 50 mg by mouth daily.    [provider]  simvastatin (ZOCOR) 20 MG tablet Take 20 mg  by mouth daily.     [provider]  sodium phosphate (FLEET) 7-19 GM/118ML ENEM Place 133 mLs (1 enema total) rectally daily as needed for up to 1 dose for severe constipation. 03/05/22   Wyvonnia Dusky, MD  sulfamethoxazole-trimethoprim (BACTRIM DS) 800-160 MG tablet Take 1 tablet by mouth daily. Start taking one day prior to your appointment for your first follow-up and catheter removal.  Continue taking for three days. Patient not taking: Reported on 06/20/2020 04/14/20   Ardis Hughs, MD  traMADol (ULTRAM) 50 MG tablet Take 1 tablet (50 mg total) by mouth every 6 (six) hours as needed. 02/19/22   Wyatt Portela, MD  triamcinolone cream (KENALOG)  0.1 % Apply 1 application. topically 2 (two) times daily. 12/24/21   Wyatt Portela, MD      Allergies    Codeine    Review of Systems   Review of Systems  Physical Exam Updated Vital Signs BP (!) 161/83   Pulse 69   Temp 98.2 F (36.8 C) (Oral)   Resp 16   SpO2 98%  Physical Exam Vitals and nursing note reviewed.  Constitutional:      Appearance: He is well-developed.  HENT:     Head: Normocephalic and atraumatic.     Mouth/Throat:     Mouth: Mucous membranes are moist.  Eyes:     Extraocular Movements: Extraocular movements intact.  Cardiovascular:     Rate and Rhythm: Normal rate.  Pulmonary:     Effort: Pulmonary effort is normal.     Breath sounds: Normal breath sounds.  Abdominal:     General: Bowel sounds are normal. There is distension.     Palpations: Abdomen is soft.     Tenderness: There is generalized abdominal tenderness.  Neurological:     Mental Status: He is alert.     ED Results / Procedures / Treatments   Labs (all labs ordered are listed, but only abnormal results are displayed) Labs Reviewed  URINALYSIS, ROUTINE W REFLEX MICROSCOPIC - Abnormal; Notable for the following components:      Result Value   Specific Gravity, Urine 1.038 (*)    Glucose, UA 150 (*)    Ketones, ur 20 (*)    Protein, ur 30 (*)    All other components within normal limits  CBC WITH DIFFERENTIAL/PLATELET - Abnormal; Notable for the following components:   Hemoglobin 12.8 (*)    HCT 38.2 (*)    All other components within normal limits  COMPREHENSIVE METABOLIC PANEL - Abnormal; Notable for the following components:   Sodium 131 (*)    Glucose, Bld 288 (*)    Creatinine, Ser 1.42 (*)    AST 12 (*)    GFR, Estimated 51 (*)    All other components within normal limits  LIPASE, BLOOD    EKG None  Radiology CT Renal Stone Study  Result Date: 03/12/2022 CLINICAL DATA:  Metastatic high-grade urothelial carcinoma, prior right nephrectomy, ongoing immunotherapy  with active nodal metastasis. Ongoing right flank pain and abdominal pain for 3 weeks with nausea. * Tracking Code: BO * EXAM: CT ABDOMEN AND PELVIS WITHOUT CONTRAST TECHNIQUE: Multidetector CT imaging of the abdomen and pelvis was performed following the standard protocol without IV contrast. RADIATION DOSE REDUCTION: This exam was performed according to the departmental dose-optimization program which includes automated exposure control, adjustment of the mA and/or kV according to patient size and/or use of iterative reconstruction technique. COMPARISON:  Multiple exams, including 03/05/2022 FINDINGS:  Lower chest: Cylindrical bronchiectasis in both lower lobes. Stable scarring in both lower lobes. Right coronary artery atherosclerosis and mild aortic valve calcification. Descending thoracic aortic atherosclerosis. Hepatobiliary: Multiple small gallstones. 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 No appreciable biliary dilatation. Pancreas: Chronic calcific pancreatitis without findings of active inflammation. Spleen: Unremarkable Adrenals/Urinary Tract: The left adrenal gland appears normal. Right adrenal gland not well seen. The right kidney is absent. No findings of tumor along the nephrectomy bed. Stomach/Bowel: Descending and sigmoid colon diverticulosis. Indirect left inguinal hernia containing adipose tissue and part of the sigmoid colon without compelling findings of obstruction. No definite abnormal wall thickening or inflammatory findings in the herniated sigmoid colon to indicate strangulation or other bowel distress. Normal appendix. Vascular/Lymphatic: Atherosclerosis is present, including aortoiliac atherosclerotic disease. 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 Below the left renal vein, an indistinctly marginated lymph node  measures 2.3 cm in short axis (image 41, series 2) formerly the same on 03/05/2022. No other pathologic adenopathy is observed. Reproductive: Unremarkable Other: No supplemental non-categorized findings. Musculoskeletal: Multilevel Schmorl's nodes with degenerative endplate findings at Z7-6 and L5-S1. Lumbar spondylosis and degenerative disc disease causing bilateral foraminal impingement at L4-5 and L5-S1. Chronic mild superior endplate compression at T12 with chronic bone island in the T11 vertebral body. Supraumbilical hernia contains adipose tissue appears bilobed for example on image 81 series 5. Left indirect inguinal hernia as described above in the stomach/bowel section. IMPRESSION: 1. A specific cause for right flank pain is not identified. 2. Indirect left inguinal hernia containing adipose tissue and sigmoid colon, without specific indicators of bowel distress or obstruction. There is scattered diverticulosis most notable in the sigmoid colon. 3. Pathologic adenopathy anterior to the abdominal aorta just below the left renal vein, this indistinctly marginated node measures 2.3 cm in short axis which is stable from 03/05/2022. 4. Other imaging findings of potential clinical significance: Cylindrical bronchiectasis in both lower lobes. Aortic Atherosclerosis (ICD10-I70.0). Coronary atherosclerosis. Mild aortic valve calcification. 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 Cholelithiasis. Chronic calcific pancreatitis. Right nephrectomy and possible right adrenalectomy. Splenorenal shunting indicating portal venous hypertension. Foraminal impingement bilaterally at L4-5 and L5-S1. Supraumbilical hernia contains adipose tissue. Electronically Signed   By: Van Clines M.D.   On: 03/12/2022 14:58    Procedures Procedures    Medications Ordered in ED Medications  lactated ringers bolus 500 mL (0 mLs Intravenous Stopped 03/12/22 1540)  lactated ringers bolus 500 mL (500 mLs  Intravenous New Bag/Given 03/12/22 1630)    ED Course/ Medical Decision Making/ A&P Clinical Course as of 03/12/22 1643  Wed Mar 12, 2022  1521 Comprehensive metabolic panel(!) Patient with worsening renal function with creatinine increased to 1.42 Hyperglycemia with glucose 288, no evidence of dka [DR]  1604 Urinalysis with increased specific gravity, 20 ketones, no bacteria seen [DR]  7341 Metabolic panel reviewed significant for elevated glucose of 288 and creatinine increased to 1.42 from baseline of 1.02-week ago Review of creatinines ranged from 1-1.5. [DR]    Clinical Course User Index [DR] Pattricia Boss, MD                           Medical Decision Making 76 year old male with known history of urologic cancer, presents today with a long abdominal pain.  He is evaluated with CT CT  scan work-up shows some potential causes of his pain, which would include inguinal hernia (not incarcerated, no bowel obstruction) versus functional constipation versus compression fractures versus para-aortic lymph node. Patient with increased creatinine.  He is being hydrated here in the ED with normal saline. Would advise admission for hydration and to assure that renal function is improved 1- abdominal pain- unclear etiology-would consider further evaluation by urology and general surgery. Does not appear to have acute abdomen at this time. Patient advised re f/u with urology, oncology, and medicine 2- associated nausea and vomiting with weight loss and AKI-patient is taking po fluids and has antiemetics Discussed return precautions and need for follow-up and voices understanding.    Amount and/or Complexity of Data Reviewed Labs: ordered. Decision-making details documented in ED Course. Radiology: ordered and independent interpretation performed. Decision-making details documented in ED Course.           Final Clinical Impression(s) / ED Diagnoses Final diagnoses:  Generalized abdominal  pain    Rx / DC Orders ED Discharge Orders     None         Pattricia Boss, MD 03/12/22 1643

## 2022-03-13 ENCOUNTER — Other Ambulatory Visit: Payer: Self-pay | Admitting: *Deleted

## 2022-03-13 DIAGNOSIS — C649 Malignant neoplasm of unspecified kidney, except renal pelvis: Secondary | ICD-10-CM

## 2022-03-13 MED ORDER — HYDROCODONE-ACETAMINOPHEN 5-325 MG PO TABS: 1.0000 | ORAL_TABLET | Freq: Four times a day (QID) | ORAL | 0 refills | Status: DC | PRN

## 2022-03-19 DIAGNOSIS — C7910 Secondary malignant neoplasm of unspecified urinary organs: Secondary | ICD-10-CM | POA: Diagnosis not present

## 2022-03-19 DIAGNOSIS — E1169 Type 2 diabetes mellitus with other specified complication: Secondary | ICD-10-CM | POA: Diagnosis not present

## 2022-03-19 DIAGNOSIS — C661 Malignant neoplasm of right ureter: Secondary | ICD-10-CM | POA: Diagnosis not present

## 2022-03-19 DIAGNOSIS — S22000A Wedge compression fracture of unspecified thoracic vertebra, initial encounter for closed fracture: Secondary | ICD-10-CM | POA: Diagnosis not present

## 2022-03-19 DIAGNOSIS — Z6826 Body mass index (BMI) 26.0-26.9, adult: Secondary | ICD-10-CM | POA: Diagnosis not present

## 2022-03-19 DIAGNOSIS — K4090 Unilateral inguinal hernia, without obstruction or gangrene, not specified as recurrent: Secondary | ICD-10-CM | POA: Diagnosis not present

## 2022-03-19 DIAGNOSIS — S32000A Wedge compression fracture of unspecified lumbar vertebra, initial encounter for closed fracture: Secondary | ICD-10-CM | POA: Diagnosis not present

## 2022-03-19 DIAGNOSIS — R1084 Generalized abdominal pain: Secondary | ICD-10-CM | POA: Diagnosis not present

## 2022-03-24 DIAGNOSIS — R1084 Generalized abdominal pain: Secondary | ICD-10-CM | POA: Diagnosis not present

## 2022-03-25 ENCOUNTER — Other Ambulatory Visit: Payer: Self-pay

## 2022-03-25 ENCOUNTER — Encounter (HOSPITAL_COMMUNITY): Payer: Self-pay

## 2022-03-25 ENCOUNTER — Emergency Department (HOSPITAL_COMMUNITY)
Admission: EM | Admit: 2022-03-25 | Discharge: 2022-03-25 | Disposition: A | Discharge status: Home / Self Care | Payer: Medicare Other | Attending: Emergency Medicine | Admitting: Emergency Medicine

## 2022-03-25 ENCOUNTER — Inpatient Hospital Stay: Payer: Medicare Other

## 2022-03-25 ENCOUNTER — Inpatient Hospital Stay (HOSPITAL_BASED_OUTPATIENT_CLINIC_OR_DEPARTMENT_OTHER): Payer: Medicare Other | Admitting: Oncology

## 2022-03-25 ENCOUNTER — Telehealth: Payer: Self-pay | Admitting: *Deleted

## 2022-03-25 ENCOUNTER — Inpatient Hospital Stay: Payer: Medicare Other | Attending: Oncology

## 2022-03-25 VITALS — BP 145/75 | HR 72 | Temp 98.0°F | Resp 17 | Ht 70.0 in | Wt 178.5 lb

## 2022-03-25 DIAGNOSIS — Z79899 Other long term (current) drug therapy: Secondary | ICD-10-CM | POA: Insufficient documentation

## 2022-03-25 DIAGNOSIS — D4959 Neoplasm of unspecified behavior of other genitourinary organ: Secondary | ICD-10-CM | POA: Diagnosis not present

## 2022-03-25 DIAGNOSIS — I10 Essential (primary) hypertension: Secondary | ICD-10-CM | POA: Insufficient documentation

## 2022-03-25 DIAGNOSIS — R739 Hyperglycemia, unspecified: Secondary | ICD-10-CM

## 2022-03-25 DIAGNOSIS — E1165 Type 2 diabetes mellitus with hyperglycemia: Secondary | ICD-10-CM | POA: Diagnosis not present

## 2022-03-25 DIAGNOSIS — Z7984 Long term (current) use of oral hypoglycemic drugs: Secondary | ICD-10-CM | POA: Insufficient documentation

## 2022-03-25 DIAGNOSIS — Z20822 Contact with and (suspected) exposure to covid-19: Secondary | ICD-10-CM | POA: Diagnosis not present

## 2022-03-25 DIAGNOSIS — Z85528 Personal history of other malignant neoplasm of kidney: Secondary | ICD-10-CM | POA: Insufficient documentation

## 2022-03-25 LAB — CBC WITH DIFFERENTIAL (CANCER CENTER ONLY)
Abs Immature Granulocytes: 0.08 10*3/uL — ABNORMAL HIGH (ref 0.00–0.07)
Basophils Absolute: 0 10*3/uL (ref 0.0–0.1)
Basophils Relative: 0 %
Eosinophils Absolute: 0 10*3/uL (ref 0.0–0.5)
Eosinophils Relative: 0 %
HCT: 35.5 % — ABNORMAL LOW (ref 39.0–52.0)
Hemoglobin: 12.3 g/dL — ABNORMAL LOW (ref 13.0–17.0)
Immature Granulocytes: 1 %
Lymphocytes Relative: 7 %
Lymphs Abs: 0.7 10*3/uL (ref 0.7–4.0)
MCH: 29.4 pg (ref 26.0–34.0)
MCHC: 34.6 g/dL (ref 30.0–36.0)
MCV: 84.9 fL (ref 80.0–100.0)
Monocytes Absolute: 0.2 10*3/uL (ref 0.1–1.0)
Monocytes Relative: 3 %
Neutro Abs: 8.7 10*3/uL — ABNORMAL HIGH (ref 1.7–7.7)
Neutrophils Relative %: 89 %
Platelet Count: 276 10*3/uL (ref 150–400)
RBC: 4.18 MIL/uL — ABNORMAL LOW (ref 4.22–5.81)
RDW: 13 % (ref 11.5–15.5)
WBC Count: 9.7 10*3/uL (ref 4.0–10.5)
nRBC: 0 % (ref 0.0–0.2)

## 2022-03-25 LAB — URINALYSIS, ROUTINE W REFLEX MICROSCOPIC
Bacteria, UA: NONE SEEN
Bilirubin Urine: NEGATIVE
Glucose, UA: >=500 mg/dL — AB
Hgb urine dipstick: NEGATIVE
Ketones, ur: NEGATIVE mg/dL
Leukocytes,Ua: NEGATIVE
Nitrite: NEGATIVE
Protein, ur: NEGATIVE mg/dL
Specific Gravity, Urine: 1.027 (ref 1.005–1.030)
pH: 5 (ref 5.0–8.0)

## 2022-03-25 LAB — CMP (CANCER CENTER ONLY)
ALT: 13 U/L (ref 0–44)
AST: 9 U/L — ABNORMAL LOW (ref 15–41)
Albumin: 3.8 g/dL (ref 3.5–5.0)
Alkaline Phosphatase: 105 U/L (ref 38–126)
Anion gap: 7 (ref 5–15)
BUN: 40 mg/dL — ABNORMAL HIGH (ref 8–23)
CO2: 25 mmol/L (ref 22–32)
Calcium: 9.4 mg/dL (ref 8.9–10.3)
Chloride: 93 mmol/L — ABNORMAL LOW (ref 98–111)
Creatinine: 1.8 mg/dL — ABNORMAL HIGH (ref 0.61–1.24)
GFR, Estimated: 39 mL/min — ABNORMAL LOW (ref >60)
Glucose, Bld: 874 mg/dL (ref 70–99)
Potassium: 5.1 mmol/L (ref 3.5–5.1)
Sodium: 125 mmol/L — ABNORMAL LOW (ref 135–145)
Total Bilirubin: 0.4 mg/dL (ref 0.3–1.2)
Total Protein: 7.2 g/dL (ref 6.5–8.1)

## 2022-03-25 LAB — COMPREHENSIVE METABOLIC PANEL
ALT: 17 U/L (ref 0–44)
AST: 14 U/L — ABNORMAL LOW (ref 15–41)
Albumin: 3.5 g/dL (ref 3.5–5.0)
Alkaline Phosphatase: 101 U/L (ref 38–126)
Anion gap: 11 (ref 5–15)
BUN: 43 mg/dL — ABNORMAL HIGH (ref 8–23)
CO2: 19 mmol/L — ABNORMAL LOW (ref 22–32)
Calcium: 9.5 mg/dL (ref 8.9–10.3)
Chloride: 99 mmol/L (ref 98–111)
Creatinine, Ser: 1.62 mg/dL — ABNORMAL HIGH (ref 0.61–1.24)
GFR, Estimated: 44 mL/min — ABNORMAL LOW (ref >60)
Glucose, Bld: 861 mg/dL (ref 70–99)
Potassium: 4.9 mmol/L (ref 3.5–5.1)
Sodium: 129 mmol/L — ABNORMAL LOW (ref 135–145)
Total Bilirubin: 0.5 mg/dL (ref 0.3–1.2)
Total Protein: 7.1 g/dL (ref 6.5–8.1)

## 2022-03-25 LAB — CBC WITH DIFFERENTIAL/PLATELET
Abs Immature Granulocytes: 0.05 10*3/uL (ref 0.00–0.07)
Basophils Absolute: 0 10*3/uL (ref 0.0–0.1)
Basophils Relative: 0 %
Eosinophils Absolute: 0 10*3/uL (ref 0.0–0.5)
Eosinophils Relative: 0 %
HCT: 34.8 % — ABNORMAL LOW (ref 39.0–52.0)
Hemoglobin: 12.2 g/dL — ABNORMAL LOW (ref 13.0–17.0)
Immature Granulocytes: 1 %
Lymphocytes Relative: 6 %
Lymphs Abs: 0.6 10*3/uL — ABNORMAL LOW (ref 0.7–4.0)
MCH: 29.5 pg (ref 26.0–34.0)
MCHC: 35.1 g/dL (ref 30.0–36.0)
MCV: 84.1 fL (ref 80.0–100.0)
Monocytes Absolute: 0.2 10*3/uL (ref 0.1–1.0)
Monocytes Relative: 3 %
Neutro Abs: 8.5 10*3/uL — ABNORMAL HIGH (ref 1.7–7.7)
Neutrophils Relative %: 90 %
Platelets: 266 10*3/uL (ref 150–400)
RBC: 4.14 MIL/uL — ABNORMAL LOW (ref 4.22–5.81)
RDW: 13.1 % (ref 11.5–15.5)
WBC: 9.3 10*3/uL (ref 4.0–10.5)
nRBC: 0 % (ref 0.0–0.2)

## 2022-03-25 LAB — CBG MONITORING, ED
Glucose-Capillary: >600 mg/dL (ref 70–99)
Glucose-Capillary: 528 mg/dL (ref 70–99)
Glucose-Capillary: 483 mg/dL — ABNORMAL HIGH (ref 70–99)
Glucose-Capillary: 356 mg/dL — ABNORMAL HIGH (ref 70–99)
Glucose-Capillary: 379 mg/dL — ABNORMAL HIGH (ref 70–99)
Glucose-Capillary: 232 mg/dL — ABNORMAL HIGH (ref 70–99)
Glucose-Capillary: 243 mg/dL — ABNORMAL HIGH (ref 70–99)
Glucose-Capillary: 250 mg/dL — ABNORMAL HIGH (ref 70–99)

## 2022-03-25 LAB — BLOOD GAS, VENOUS
Acid-base deficit: 2.3 mmol/L — ABNORMAL HIGH (ref 0.0–2.0)
Bicarbonate: 22.5 mmol/L (ref 20.0–28.0)
O2 Saturation: 75.9 %
Patient temperature: 37
pCO2, Ven: 38 mmHg — ABNORMAL LOW (ref 44–60)
pH, Ven: 7.38 (ref 7.25–7.43)
pO2, Ven: 40 mmHg (ref 32–45)

## 2022-03-25 LAB — OSMOLALITY: Osmolality: 330 mOsm/kg (ref 275–295)

## 2022-03-25 LAB — RESP PANEL BY RT-PCR (FLU A&B, COVID) ARPGX2
Influenza A by PCR: NEGATIVE
Influenza B by PCR: NEGATIVE
SARS Coronavirus 2 by RT PCR: NEGATIVE

## 2022-03-25 LAB — BETA-HYDROXYBUTYRIC ACID: Beta-Hydroxybutyric Acid: 0.26 mmol/L (ref 0.05–0.27)

## 2022-03-25 LAB — LIPASE, BLOOD: Lipase: 77 U/L — ABNORMAL HIGH (ref 11–51)

## 2022-03-25 MED ORDER — DEXTROSE 50 % IV SOLN: 0.0000 mL | INTRAVENOUS | Status: DC | PRN

## 2022-03-25 MED ORDER — LACTATED RINGERS IV BOLUS
1000.0000 mL | Freq: Once | INTRAVENOUS | Status: AC
Start: 2022-03-25 — End: 2022-03-25
  Administered 2022-03-25: 1000 mL via INTRAVENOUS

## 2022-03-25 MED ORDER — LACTATED RINGERS IV SOLN: INTRAVENOUS | Status: DC

## 2022-03-25 MED ORDER — DEXTROSE IN LACTATED RINGERS 5 % IV SOLN: INTRAVENOUS | Status: DC

## 2022-03-25 MED ORDER — POTASSIUM CHLORIDE 10 MEQ/100ML IV SOLN
10.0000 meq | Freq: Once | INTRAVENOUS | Status: AC
  Administered 2022-03-25: 10 meq via INTRAVENOUS
  Filled 2022-03-25: qty 100

## 2022-03-25 MED ORDER — INSULIN ASPART PROT & ASPART (70-30 MIX) 100 UNIT/ML ~~LOC~~ SUSP
10.0000 [IU] | Freq: Once | SUBCUTANEOUS | Status: AC
  Administered 2022-03-25: 10 [IU] via SUBCUTANEOUS
  Filled 2022-03-25: qty 10

## 2022-03-25 MED ORDER — ACETAMINOPHEN 325 MG PO TABS
650.0000 mg | ORAL_TABLET | Freq: Once | ORAL | Status: AC
  Administered 2022-03-25: 650 mg via ORAL
  Filled 2022-03-25: qty 2

## 2022-03-25 MED ORDER — INSULIN REGULAR(HUMAN) IN NACL 100-0.9 UT/100ML-% IV SOLN
INTRAVENOUS | Status: DC
Start: 2022-03-25 — End: 2022-03-26
  Administered 2022-03-25: 12 [IU]/h via INTRAVENOUS
  Filled 2022-03-25: qty 100

## 2022-03-25 MED ORDER — LACTATED RINGERS IV BOLUS
20.0000 mL/kg | Freq: Once | INTRAVENOUS | Status: AC
Start: 2022-03-25 — End: 2022-03-25
  Administered 2022-03-25: 1620 mL via INTRAVENOUS

## 2022-03-25 MED ORDER — POTASSIUM CHLORIDE 10 MEQ/100ML IV SOLN
10.0000 meq | INTRAVENOUS | Status: AC
  Administered 2022-03-25 (×2): 10 meq via INTRAVENOUS
  Filled 2022-03-25 (×2): qty 100

## 2022-03-25 NOTE — Discharge Instructions (Addendum)
Follow-up with your oncologist tomorrow for further care and treatment.  If he continues giving you prednisone and/or increasing the prednisone you may need to be on insulin as well.  Avoid drinking any liquids which contain sugar.  It is very important to minimize your carbohydrate intake.  Try to drink 2 L of water every day to keep yourself well-hydrated to help lower your blood sugar.  Continue taking your other medications at this time.

## 2022-03-25 NOTE — ED Notes (Signed)
Pt given sandwich 

## 2022-03-25 NOTE — ED Triage Notes (Signed)
Pt arrived via POV. C/o n/v fatigue. States routine bloodwork this morning showed glucose >800.   CBG reading HI in triage
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
## 2022-03-25 NOTE — ED Notes (Addendum)
Pt given sandwich 

## 2022-03-25 NOTE — Telephone Encounter (Signed)
Glucose 874. Called patient and instructed him to go to the ED per Dr Alen Blew. Verbalized understanding. ED Charge nurse notified

## 2022-03-25 NOTE — ED Provider Notes (Signed)
3:35 PM checkout from Dr. Pearline Cables to evaluate patient after return of osmolality in the a.m. give additional treatment as indicated.  Apparently patient has been drinking high-volume liquids with carbohydrates.  He continues to take his usual medications  Osmolality returned, elevated, initiated insulin per protocol, HHS.  Reevaluation 11:15 PM-glucose 250.  Vital signs are trended to improvement with mild hypertension.  At this time he is alert and comfortable and remains lucid.  He states his oncologist plans on increasing his prednisone prescription.  I suspect that combination of prednisone and dietary noncompliance has elevated his blood sugar.  He does not require hospitalization at this time.   Daleen Bo, MD 03/25/22 [phone]

## 2022-03-25 NOTE — Progress Notes (Signed)
Hematology and Oncology Follow Up Visit  Hector Lopez [id number] 07/16/1946 76 y.o. 03/25/2022 12:44 PM Hector Lopez, MDBurdine, Hector Evener, MD   Principle Diagnosis: 76 year old man with stage IV high-grade urothelial carcinoma of the ureter with pelvic adenopathy diagnosed in 2023.  He initially presented with localized disease in 2021.  Prior Therapy:  He underwent robotic assisted laparoscopic  nephroureterectomy completed on Feb 10, 2020.  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  He was found to have metastatic pericaval lymph node as well as a right common iliac lymph node.  Carboplatin and gemcitabine started on July 04, 2020.  He completed 6 cycles of therapy on October 17, 2020.  Current therapy: Nivolumab 480 mg every 4 weeks started on October 29, 2021.  He is here for cycle 5 of therapy.   Interim History: Hector Lopez returns today for repeat follow-up.  Since last visit, he had continues to feel poorly without any clear-cut improvement.  He underwent MRI of the spine which showed compression fracture and his neurosurgical evaluation was completed and no intervention is recommended.  He was prescribed prednisone 40 mg which he took for few days and noticed improvement that was temporarily.  Today he is feeling more exhausted and fatigued and occasional dizziness.    Medications: Updated on review. Current Outpatient Medications  Medication Sig Dispense Refill   amLODipine (NORVASC) 10 MG tablet Take 10 mg by mouth at bedtime.      bisacodyl (DULCOLAX) 10 MG suppository Place 1 suppository (10 mg total) rectally daily as needed for up to 12 doses for moderate constipation. 12 suppository 0   cetirizine (ZYRTEC) 10 MG tablet Take 10 mg by mouth daily.     finasteride (PROSCAR) 5 MG tablet Take 5 mg by mouth daily.     glipiZIDE (GLUCOTROL) 10 MG tablet Take 10 mg by mouth daily before  breakfast.      HYDROcodone-acetaminophen (NORCO) 5-325 MG tablet Take 1 tablet by mouth every 6 (six) hours as needed for moderate pain. 30 tablet 0   HYDROmorphone (DILAUDID) 2 MG tablet Take 1 tablet (2 mg total) by mouth every 6 (six) hours as needed for up to 15 doses for severe pain. 15 tablet 0   hydrOXYzine (ATARAX) 10 MG tablet Take 1 tablet (10 mg total) by mouth 3 (three) times daily as needed. 30 tablet 0   lidocaine-prilocaine (EMLA) cream Apply 1 application topically as needed. 30 g 0   lisinopril (ZESTRIL) 40 MG tablet Take 40 mg by mouth at bedtime.      metFORMIN (GLUCOPHAGE) 1000 MG tablet Take 1,000 mg by mouth 2 (two) times daily with a meal.      omeprazole (PRILOSEC) 40 MG capsule Take 40 mg by mouth daily.     prochlorperazine (COMPAZINE) 10 MG tablet Take 1 tablet (10 mg total) by mouth every 6 (six) hours as needed for nausea or vomiting. 30 tablet 0   senna-docusate (SENOKOT-S) 8.6-50 MG tablet Take 1 tablet by mouth 2 (two) times daily. While taking strong pain meds to prevent cosntipation. 10 tablet 0   senna-docusate (SENOKOT-S) 8.6-50 MG tablet Take 1 tablet by mouth daily. 30 tablet 0   sertraline (ZOLOFT) 50 MG tablet Take 50 mg by mouth daily.     simvastatin (ZOCOR) 20 MG tablet Take 20 mg by mouth daily.      sodium phosphate (FLEET) 7-19 GM/118ML ENEM Place 133 mLs (1 enema  total) rectally daily as needed for up to 1 dose for severe constipation. 133 mL 2   sulfamethoxazole-trimethoprim (BACTRIM DS) 800-160 MG tablet Take 1 tablet by mouth daily. Start taking one day prior to your appointment for your first follow-up and catheter removal.  Continue taking for three days. (Patient not taking: Reported on 06/20/2020) 3 tablet 0   traMADol (ULTRAM) 50 MG tablet Take 1 tablet (50 mg total) by mouth every 6 (six) hours as needed. 30 tablet 1   triamcinolone cream (KENALOG) 0.1 % Apply 1 application. topically 2 (two) times daily. 45 g 3   No current  facility-administered medications for this visit.     Allergies:  Allergies  Allergen Reactions   Codeine Nausea And Vomiting      Physical Exam:    Blood pressure (!) 145/75, pulse 72, temperature 98 F (36.7 C), temperature source Temporal, resp. rate 17, height '5\' 10"'$  (1.778 m), weight 178 lb 8 oz (81 kg), SpO2 100 %.       ECOG: 1     General appearance: Alert, awake without any distress. Head: Atraumatic without abnormalities Oropharynx: Without any thrush or ulcers. Eyes: No scleral icterus. Lymph nodes: No lymphadenopathy noted in the cervical, supraclavicular, or axillary nodes Heart:regular rate and rhythm, without any murmurs or gallops.   Lung: Clear to auscultation without any rhonchi, wheezes or dullness to percussion. Abdomin: Soft, nontender without any shifting dullness or ascites. Musculoskeletal: No clubbing or cyanosis. Neurological: No motor or sensory deficits. Skin: No rashes or lesions.                 Lab Results: Lab Results  Component Value Date   WBC 8.0 03/12/2022   HGB 12.8 (L) 03/12/2022   HCT 38.2 (L) 03/12/2022   MCV 88.0 03/12/2022   PLT 260 03/12/2022     Chemistry      Component Value Date/Time   NA 131 (L) 03/12/2022 1243   K 4.8 03/12/2022 1243   CL 101 03/12/2022 1243   CO2 22 03/12/2022 1243   BUN 20 03/12/2022 1243   CREATININE 1.42 (H) 03/12/2022 1243   CREATININE 1.19 02/25/2022 0945      Component Value Date/Time   CALCIUM 9.2 03/12/2022 1243   ALKPHOS 104 03/12/2022 1243   AST 12 (L) 03/12/2022 1243   AST 12 (L) 02/25/2022 0945   ALT 9 03/12/2022 1243   ALT 10 02/25/2022 0945   BILITOT 0.5 03/12/2022 1243   BILITOT 0.4 02/25/2022 0945          Impression and Plan:  76 year old man with:   1.  Stage IV high-grade urothelial of the ureter with pelvic adenopathy diagnosed in 2023.   He is currently on nivolumab which she has tolerated without any major complications.  Risks and  benefits of continuing this treatment long-term were reviewed.  Given his recent complaints I have recommended withholding treatment at this time.  It is unclear if his symptoms are related to treatment.  I do not see any evidence of malignancy or progression that could contribute to his symptoms.  2.  IV access: Port-A-Cath remains in use without any issues.  3.  Immune mediated issues: It is unclear whether his symptoms.  Are related to autoimmune complications.  We have discussed preventatively starting him on 80 mg of prednisone.  He is already experiencing symptoms of hyperglycemia which could be problematic at this time.  Once his blood sugar has been stabilized we can consider starting him  on 80 mg of prednisone.   4.  Hyperglycemia: Related to his recent prednisone which could be also contributing to his symptoms.  I recommended urgent evaluation emergency department.  We will withhold the start of high-dose prednisone  6.  Compression fracture noted at T12 and L4 resulting in back pain.  He is receiving supportive management at this time.  It is unclear if is contributing to his symptoms.    7.  Follow-up: In the next 3 to 4 weeks for repeat evaluation.   30  minutes were spent on this visit.  The time was dedicated to review laboratory data, disease status update, treatment choices and addressing complication related to his cancer and cancer therapy.      Zola Button, MD 7/11/202312:44 PM

## 2022-03-27 ENCOUNTER — Encounter: Payer: Self-pay | Admitting: Oncology

## 2022-03-27 ENCOUNTER — Telehealth: Payer: Self-pay | Admitting: *Deleted

## 2022-03-27 ENCOUNTER — Telehealth: Payer: Self-pay | Admitting: Oncology

## 2022-03-27 NOTE — Telephone Encounter (Signed)
Returned PC to patient regarding - he is asking if she should be taking prednisone since his glucose level has been extremely high.  Informed patient per Dr. Alen Blew, he should not be taking any prednisone unless he is on insulin.  Patient states he is not taking insulin so he will hold his prednisone until he sees Dr. Alen Blew in August.  Also states he sees his PCP tomorrow.

## 2022-03-27 NOTE — Telephone Encounter (Signed)
Scheduled per 07/11 los, patient has been called and notified.

## 2022-03-28 ENCOUNTER — Other Ambulatory Visit: Payer: Self-pay

## 2022-03-28 ENCOUNTER — Observation Stay (HOSPITAL_COMMUNITY): Payer: Medicare Other

## 2022-03-28 ENCOUNTER — Emergency Department (HOSPITAL_COMMUNITY): Payer: Medicare Other

## 2022-03-28 ENCOUNTER — Encounter (HOSPITAL_COMMUNITY): Payer: Self-pay | Admitting: Emergency Medicine

## 2022-03-28 ENCOUNTER — Inpatient Hospital Stay (HOSPITAL_COMMUNITY)
Admission: EM | Admit: 2022-03-28 | Discharge: 2022-04-04 | DRG: 074 | Disposition: A | Discharge status: Home / Self Care | Payer: Medicare Other | Attending: Internal Medicine | Admitting: Internal Medicine

## 2022-03-28 DIAGNOSIS — C661 Malignant neoplasm of right ureter: Secondary | ICD-10-CM | POA: Diagnosis present

## 2022-03-28 DIAGNOSIS — Z79899 Other long term (current) drug therapy: Secondary | ICD-10-CM

## 2022-03-28 DIAGNOSIS — K8020 Calculus of gallbladder without cholecystitis without obstruction: Secondary | ICD-10-CM | POA: Diagnosis present

## 2022-03-28 DIAGNOSIS — R10A1 Flank pain, right side: Secondary | ICD-10-CM | POA: Diagnosis present

## 2022-03-28 DIAGNOSIS — R739 Hyperglycemia, unspecified: Principal | ICD-10-CM

## 2022-03-28 DIAGNOSIS — S32000A Wedge compression fracture of unspecified lumbar vertebra, initial encounter for closed fracture: Secondary | ICD-10-CM | POA: Diagnosis not present

## 2022-03-28 DIAGNOSIS — R1010 Upper abdominal pain, unspecified: Secondary | ICD-10-CM | POA: Diagnosis not present

## 2022-03-28 DIAGNOSIS — D649 Anemia, unspecified: Secondary | ICD-10-CM | POA: Diagnosis present

## 2022-03-28 DIAGNOSIS — Z9221 Personal history of antineoplastic chemotherapy: Secondary | ICD-10-CM

## 2022-03-28 DIAGNOSIS — Z7982 Long term (current) use of aspirin: Secondary | ICD-10-CM

## 2022-03-28 DIAGNOSIS — N400 Enlarged prostate without lower urinary tract symptoms: Secondary | ICD-10-CM | POA: Diagnosis not present

## 2022-03-28 DIAGNOSIS — E1122 Type 2 diabetes mellitus with diabetic chronic kidney disease: Secondary | ICD-10-CM | POA: Diagnosis not present

## 2022-03-28 DIAGNOSIS — Z0001 Encounter for general adult medical examination with abnormal findings: Secondary | ICD-10-CM | POA: Diagnosis not present

## 2022-03-28 DIAGNOSIS — C787 Secondary malignant neoplasm of liver and intrahepatic bile duct: Secondary | ICD-10-CM | POA: Diagnosis present

## 2022-03-28 DIAGNOSIS — Z9079 Acquired absence of other genital organ(s): Secondary | ICD-10-CM

## 2022-03-28 DIAGNOSIS — C7910 Secondary malignant neoplasm of unspecified urinary organs: Secondary | ICD-10-CM | POA: Diagnosis not present

## 2022-03-28 DIAGNOSIS — J9811 Atelectasis: Secondary | ICD-10-CM | POA: Diagnosis not present

## 2022-03-28 DIAGNOSIS — C649 Malignant neoplasm of unspecified kidney, except renal pelvis: Secondary | ICD-10-CM | POA: Diagnosis not present

## 2022-03-28 DIAGNOSIS — I10 Essential (primary) hypertension: Secondary | ICD-10-CM | POA: Diagnosis not present

## 2022-03-28 DIAGNOSIS — C7801 Secondary malignant neoplasm of right lung: Secondary | ICD-10-CM | POA: Diagnosis present

## 2022-03-28 DIAGNOSIS — E782 Mixed hyperlipidemia: Secondary | ICD-10-CM | POA: Diagnosis present

## 2022-03-28 DIAGNOSIS — C641 Malignant neoplasm of right kidney, except renal pelvis: Secondary | ICD-10-CM | POA: Diagnosis not present

## 2022-03-28 DIAGNOSIS — R52 Pain, unspecified: Secondary | ICD-10-CM

## 2022-03-28 DIAGNOSIS — C7802 Secondary malignant neoplasm of left lung: Secondary | ICD-10-CM | POA: Diagnosis present

## 2022-03-28 DIAGNOSIS — S22080A Wedge compression fracture of T11-T12 vertebra, initial encounter for closed fracture: Secondary | ICD-10-CM | POA: Diagnosis not present

## 2022-03-28 DIAGNOSIS — K219 Gastro-esophageal reflux disease without esophagitis: Secondary | ICD-10-CM | POA: Diagnosis present

## 2022-03-28 DIAGNOSIS — C642 Malignant neoplasm of left kidney, except renal pelvis: Secondary | ICD-10-CM

## 2022-03-28 DIAGNOSIS — Z20822 Contact with and (suspected) exposure to covid-19: Secondary | ICD-10-CM | POA: Diagnosis present

## 2022-03-28 DIAGNOSIS — Z7984 Long term (current) use of oral hypoglycemic drugs: Secondary | ICD-10-CM

## 2022-03-28 DIAGNOSIS — R109 Unspecified abdominal pain: Secondary | ICD-10-CM | POA: Diagnosis present

## 2022-03-28 DIAGNOSIS — E860 Dehydration: Secondary | ICD-10-CM | POA: Diagnosis present

## 2022-03-28 DIAGNOSIS — K4090 Unilateral inguinal hernia, without obstruction or gangrene, not specified as recurrent: Secondary | ICD-10-CM | POA: Diagnosis present

## 2022-03-28 DIAGNOSIS — S32040A Wedge compression fracture of fourth lumbar vertebra, initial encounter for closed fracture: Secondary | ICD-10-CM | POA: Diagnosis not present

## 2022-03-28 DIAGNOSIS — M549 Dorsalgia, unspecified: Secondary | ICD-10-CM | POA: Diagnosis not present

## 2022-03-28 DIAGNOSIS — E1165 Type 2 diabetes mellitus with hyperglycemia: Secondary | ICD-10-CM | POA: Diagnosis present

## 2022-03-28 DIAGNOSIS — C669 Malignant neoplasm of unspecified ureter: Secondary | ICD-10-CM | POA: Diagnosis not present

## 2022-03-28 DIAGNOSIS — N179 Acute kidney failure, unspecified: Secondary | ICD-10-CM | POA: Diagnosis not present

## 2022-03-28 DIAGNOSIS — E8889 Other specified metabolic disorders: Secondary | ICD-10-CM | POA: Diagnosis present

## 2022-03-28 DIAGNOSIS — R1084 Generalized abdominal pain: Secondary | ICD-10-CM | POA: Diagnosis not present

## 2022-03-28 DIAGNOSIS — E871 Hypo-osmolality and hyponatremia: Secondary | ICD-10-CM | POA: Diagnosis present

## 2022-03-28 DIAGNOSIS — E1140 Type 2 diabetes mellitus with diabetic neuropathy, unspecified: Principal | ICD-10-CM | POA: Diagnosis present

## 2022-03-28 DIAGNOSIS — F32A Depression, unspecified: Secondary | ICD-10-CM | POA: Diagnosis present

## 2022-03-28 DIAGNOSIS — C7951 Secondary malignant neoplasm of bone: Secondary | ICD-10-CM | POA: Diagnosis present

## 2022-03-28 DIAGNOSIS — Z9001 Acquired absence of eye: Secondary | ICD-10-CM | POA: Diagnosis not present

## 2022-03-28 DIAGNOSIS — Z905 Acquired absence of kidney: Secondary | ICD-10-CM

## 2022-03-28 DIAGNOSIS — Z6825 Body mass index (BMI) 25.0-25.9, adult: Secondary | ICD-10-CM | POA: Diagnosis not present

## 2022-03-28 DIAGNOSIS — K5900 Constipation, unspecified: Secondary | ICD-10-CM | POA: Diagnosis present

## 2022-03-28 DIAGNOSIS — S22000A Wedge compression fracture of unspecified thoracic vertebra, initial encounter for closed fracture: Secondary | ICD-10-CM | POA: Diagnosis not present

## 2022-03-28 LAB — COMPREHENSIVE METABOLIC PANEL
ALT: 13 U/L (ref 0–44)
AST: 11 U/L — ABNORMAL LOW (ref 15–41)
Albumin: 3.3 g/dL — ABNORMAL LOW (ref 3.5–5.0)
Alkaline Phosphatase: 83 U/L (ref 38–126)
Anion gap: 12 (ref 5–15)
BUN: 32 mg/dL — ABNORMAL HIGH (ref 8–23)
CO2: 25 mmol/L (ref 22–32)
Calcium: 9.1 mg/dL (ref 8.9–10.3)
Chloride: 96 mmol/L — ABNORMAL LOW (ref 98–111)
Creatinine, Ser: 1.65 mg/dL — ABNORMAL HIGH (ref 0.61–1.24)
GFR, Estimated: 43 mL/min — ABNORMAL LOW (ref >60)
Glucose, Bld: 448 mg/dL — ABNORMAL HIGH (ref 70–99)
Potassium: 4.1 mmol/L (ref 3.5–5.1)
Sodium: 133 mmol/L — ABNORMAL LOW (ref 135–145)
Total Bilirubin: 0.7 mg/dL (ref 0.3–1.2)
Total Protein: 7.1 g/dL (ref 6.5–8.1)

## 2022-03-28 LAB — URINALYSIS, ROUTINE W REFLEX MICROSCOPIC
Bilirubin Urine: NEGATIVE
Glucose, UA: >=500 mg/dL — AB
Hgb urine dipstick: NEGATIVE
Ketones, ur: 20 mg/dL — AB
Leukocytes,Ua: NEGATIVE
Nitrite: NEGATIVE
Protein, ur: 30 mg/dL — AB
Specific Gravity, Urine: 1.032 — ABNORMAL HIGH (ref 1.005–1.030)
pH: 5 (ref 5.0–8.0)

## 2022-03-28 LAB — LACTIC ACID, PLASMA
Lactic Acid, Venous: 1.9 mmol/L (ref 0.5–1.9)
Lactic Acid, Venous: 1.3 mmol/L (ref 0.5–1.9)

## 2022-03-28 LAB — CBC WITH DIFFERENTIAL/PLATELET
Abs Immature Granulocytes: 0.08 10*3/uL — ABNORMAL HIGH (ref 0.00–0.07)
Basophils Absolute: 0 10*3/uL (ref 0.0–0.1)
Basophils Relative: 0 %
Eosinophils Absolute: 0.1 10*3/uL (ref 0.0–0.5)
Eosinophils Relative: 1 %
HCT: 36.8 % — ABNORMAL LOW (ref 39.0–52.0)
Hemoglobin: 12.4 g/dL — ABNORMAL LOW (ref 13.0–17.0)
Immature Granulocytes: 1 %
Lymphocytes Relative: 13 %
Lymphs Abs: 1.2 10*3/uL (ref 0.7–4.0)
MCH: 29.5 pg (ref 26.0–34.0)
MCHC: 33.7 g/dL (ref 30.0–36.0)
MCV: 87.4 fL (ref 80.0–100.0)
Monocytes Absolute: 0.6 10*3/uL (ref 0.1–1.0)
Monocytes Relative: 7 %
Neutro Abs: 7.5 10*3/uL (ref 1.7–7.7)
Neutrophils Relative %: 78 %
Platelets: 245 10*3/uL (ref 150–400)
RBC: 4.21 MIL/uL — ABNORMAL LOW (ref 4.22–5.81)
RDW: 13.4 % (ref 11.5–15.5)
WBC: 9.5 10*3/uL (ref 4.0–10.5)
nRBC: 0 % (ref 0.0–0.2)

## 2022-03-28 LAB — I-STAT CHEM 8, ED
BUN: 30 mg/dL — ABNORMAL HIGH (ref 8–23)
Calcium, Ion: 1.23 mmol/L (ref 1.15–1.40)
Chloride: 98 mmol/L (ref 98–111)
Creatinine, Ser: 1.6 mg/dL — ABNORMAL HIGH (ref 0.61–1.24)
Glucose, Bld: 439 mg/dL — ABNORMAL HIGH (ref 70–99)
HCT: 39 % (ref 39.0–52.0)
Hemoglobin: 13.3 g/dL (ref 13.0–17.0)
Potassium: 4.1 mmol/L (ref 3.5–5.1)
Sodium: 134 mmol/L — ABNORMAL LOW (ref 135–145)
TCO2: 25 mmol/L (ref 22–32)

## 2022-03-28 LAB — CBG MONITORING, ED
Glucose-Capillary: 415 mg/dL — ABNORMAL HIGH (ref 70–99)
Glucose-Capillary: 260 mg/dL — ABNORMAL HIGH (ref 70–99)
Glucose-Capillary: 181 mg/dL — ABNORMAL HIGH (ref 70–99)

## 2022-03-28 LAB — GLUCOSE, CAPILLARY: Glucose-Capillary: 225 mg/dL — ABNORMAL HIGH (ref 70–99)

## 2022-03-28 LAB — BETA-HYDROXYBUTYRIC ACID: Beta-Hydroxybutyric Acid: 0.94 mmol/L — ABNORMAL HIGH (ref 0.05–0.27)

## 2022-03-28 MED ORDER — LISINOPRIL 10 MG PO TABS: 40.0000 mg | ORAL_TABLET | Freq: Every day | ORAL | Status: DC

## 2022-03-28 MED ORDER — FINASTERIDE 5 MG PO TABS
5.0000 mg | ORAL_TABLET | Freq: Every day | ORAL | Status: DC
  Administered 2022-03-28 – 2022-04-04 (×8): 5 mg via ORAL
  Filled 2022-03-28 (×8): qty 1

## 2022-03-28 MED ORDER — SODIUM CHLORIDE (PF) 0.9 % IJ SOLN
INTRAMUSCULAR | Status: AC
  Filled 2022-03-28: qty 50

## 2022-03-28 MED ORDER — ACETAMINOPHEN 650 MG RE SUPP: 650.0000 mg | Freq: Four times a day (QID) | RECTAL | Status: DC | PRN

## 2022-03-28 MED ORDER — GLIPIZIDE 10 MG PO TABS
10.0000 mg | ORAL_TABLET | Freq: Every day | ORAL | Status: DC
  Administered 2022-03-29: 10 mg via ORAL
  Filled 2022-03-28 (×2): qty 1

## 2022-03-28 MED ORDER — HYDROMORPHONE HCL 1 MG/ML IJ SOLN: 1.0000 mg | INTRAMUSCULAR | Status: DC | PRN

## 2022-03-28 MED ORDER — PHENYLEPHRINE HCL 1 % NA SOLN: 1.0000 [drp] | Freq: Four times a day (QID) | NASAL | Status: DC | PRN

## 2022-03-28 MED ORDER — SODIUM CHLORIDE 0.9 % IV BOLUS
1000.0000 mL | Freq: Once | INTRAVENOUS | Status: AC
  Administered 2022-03-28: 1000 mL via INTRAVENOUS

## 2022-03-28 MED ORDER — LACTATED RINGERS IV BOLUS
1000.0000 mL | Freq: Once | INTRAVENOUS | Status: AC
  Administered 2022-03-28: 1000 mL via INTRAVENOUS

## 2022-03-28 MED ORDER — SERTRALINE HCL 50 MG PO TABS
50.0000 mg | ORAL_TABLET | Freq: Every day | ORAL | Status: DC
Start: 2022-03-29 — End: 2022-04-04
  Administered 2022-03-29 – 2022-04-03 (×6): 50 mg via ORAL
  Filled 2022-03-28 (×6): qty 1

## 2022-03-28 MED ORDER — SENNOSIDES-DOCUSATE SODIUM 8.6-50 MG PO TABS
1.0000 | ORAL_TABLET | Freq: Every day | ORAL | Status: DC
  Administered 2022-03-29 – 2022-03-31 (×3): 1 via ORAL
  Filled 2022-03-28 (×3): qty 1

## 2022-03-28 MED ORDER — SODIUM CHLORIDE 0.45 % IV SOLN: INTRAVENOUS | Status: DC

## 2022-03-28 MED ORDER — SIMVASTATIN 10 MG PO TABS
20.0000 mg | ORAL_TABLET | Freq: Every day | ORAL | Status: DC
Start: 2022-03-28 — End: 2022-04-04
  Administered 2022-03-28 – 2022-04-03 (×7): 20 mg via ORAL
  Filled 2022-03-28 (×7): qty 2

## 2022-03-28 MED ORDER — METFORMIN HCL 500 MG PO TABS
1000.0000 mg | ORAL_TABLET | Freq: Two times a day (BID) | ORAL | Status: DC
Start: 2022-03-31 — End: 2022-03-29

## 2022-03-28 MED ORDER — TRAMADOL HCL 50 MG PO TABS
50.0000 mg | ORAL_TABLET | ORAL | Status: DC | PRN
  Administered 2022-03-28: 50 mg via ORAL
  Filled 2022-03-28: qty 1

## 2022-03-28 MED ORDER — TRAZODONE HCL 50 MG PO TABS
50.0000 mg | ORAL_TABLET | Freq: Every evening | ORAL | Status: DC | PRN
  Administered 2022-03-30: 50 mg via ORAL
  Filled 2022-03-28: qty 1

## 2022-03-28 MED ORDER — INSULIN ASPART 100 UNIT/ML IJ SOLN
0.0000 [IU] | Freq: Three times a day (TID) | INTRAMUSCULAR | Status: DC
  Administered 2022-03-28: 3 [IU] via SUBCUTANEOUS
  Administered 2022-03-29: 8 [IU] via SUBCUTANEOUS
  Administered 2022-03-29: 5 [IU] via SUBCUTANEOUS
  Administered 2022-03-29: 2 [IU] via SUBCUTANEOUS
  Administered 2022-03-30: 3 [IU] via SUBCUTANEOUS
  Administered 2022-03-30: 5 [IU] via SUBCUTANEOUS
  Administered 2022-03-31: 3 [IU] via SUBCUTANEOUS
  Administered 2022-03-31: 2 [IU] via SUBCUTANEOUS
  Administered 2022-03-31: 11 [IU] via SUBCUTANEOUS
  Administered 2022-04-01: 8 [IU] via SUBCUTANEOUS
  Administered 2022-04-01: 5 [IU] via SUBCUTANEOUS
  Administered 2022-04-01: 3 [IU] via SUBCUTANEOUS
  Administered 2022-04-02: 2 [IU] via SUBCUTANEOUS
  Administered 2022-04-02: 4 [IU] via SUBCUTANEOUS
  Administered 2022-04-02: 5 [IU] via SUBCUTANEOUS
  Administered 2022-04-03 (×2): 3 [IU] via SUBCUTANEOUS
  Administered 2022-04-03: 5 [IU] via SUBCUTANEOUS
  Administered 2022-04-04: 3 [IU] via SUBCUTANEOUS
  Filled 2022-03-28: qty 0.15

## 2022-03-28 MED ORDER — ONDANSETRON HCL 4 MG PO TABS: 4.0000 mg | ORAL_TABLET | Freq: Four times a day (QID) | ORAL | Status: DC | PRN

## 2022-03-28 MED ORDER — ASPIRIN 81 MG PO TBEC
81.0000 mg | DELAYED_RELEASE_TABLET | Freq: Every morning | ORAL | Status: DC
  Administered 2022-03-29 – 2022-04-04 (×7): 81 mg via ORAL
  Filled 2022-03-28 (×7): qty 1

## 2022-03-28 MED ORDER — LABETALOL HCL 5 MG/ML IV SOLN
20.0000 mg | INTRAVENOUS | Status: DC | PRN
Start: 2022-03-28 — End: 2022-04-04
  Administered 2022-03-29: 20 mg via INTRAVENOUS
  Filled 2022-03-28: qty 4

## 2022-03-28 MED ORDER — IOHEXOL 350 MG/ML SOLN
75.0000 mL | Freq: Once | INTRAVENOUS | Status: AC | PRN
  Administered 2022-03-28: 75 mL via INTRAVENOUS

## 2022-03-28 MED ORDER — ONDANSETRON HCL 4 MG/2ML IJ SOLN
4.0000 mg | Freq: Four times a day (QID) | INTRAMUSCULAR | Status: DC | PRN
  Administered 2022-03-28: 4 mg via INTRAVENOUS
  Filled 2022-03-28 (×2): qty 2

## 2022-03-28 MED ORDER — HYDROMORPHONE HCL 1 MG/ML IJ SOLN
1.0000 mg | Freq: Once | INTRAMUSCULAR | Status: AC
  Administered 2022-03-28: 1 mg via INTRAVENOUS
  Filled 2022-03-28: qty 1

## 2022-03-28 MED ORDER — ENOXAPARIN SODIUM 40 MG/0.4ML IJ SOSY
40.0000 mg | PREFILLED_SYRINGE | Freq: Every day | INTRAMUSCULAR | Status: DC
Start: 2022-03-28 — End: 2022-04-04
  Administered 2022-03-28 – 2022-04-03 (×7): 40 mg via SUBCUTANEOUS
  Filled 2022-03-28 (×8): qty 0.4

## 2022-03-28 MED ORDER — ONDANSETRON HCL 4 MG/2ML IJ SOLN
4.0000 mg | Freq: Once | INTRAMUSCULAR | Status: AC
  Administered 2022-03-28: 4 mg via INTRAVENOUS
  Filled 2022-03-28: qty 2

## 2022-03-28 MED ORDER — HYDROMORPHONE HCL 1 MG/ML IJ SOLN
1.0000 mg | INTRAMUSCULAR | Status: AC | PRN
  Administered 2022-03-28 – 2022-03-31 (×6): 1 mg via INTRAVENOUS
  Filled 2022-03-28 (×7): qty 1

## 2022-03-28 MED ORDER — LATANOPROST 0.005 % OP SOLN
1.0000 [drp] | Freq: Every day | OPHTHALMIC | Status: DC
  Administered 2022-03-28 – 2022-04-03 (×7): 1 [drp] via OPHTHALMIC
  Filled 2022-03-28 (×2): qty 2.5

## 2022-03-28 MED ORDER — CALCITONIN (SALMON) 200 UNIT/ACT NA SOLN
1.0000 | Freq: Every day | NASAL | Status: DC
  Administered 2022-03-29 – 2022-04-04 (×7): 1 via NASAL
  Filled 2022-03-28 (×3): qty 3.7

## 2022-03-28 MED ORDER — AMLODIPINE BESYLATE 10 MG PO TABS
10.0000 mg | ORAL_TABLET | Freq: Every day | ORAL | Status: DC
  Administered 2022-03-28 – 2022-04-03 (×7): 10 mg via ORAL
  Filled 2022-03-28 (×7): qty 1

## 2022-03-28 MED ORDER — INSULIN ASPART 100 UNIT/ML IJ SOLN
10.0000 [IU] | Freq: Once | INTRAMUSCULAR | Status: AC
  Administered 2022-03-28: 10 [IU] via SUBCUTANEOUS
  Filled 2022-03-28: qty 0.1

## 2022-03-28 MED ORDER — ACETAMINOPHEN 325 MG PO TABS: 650.0000 mg | ORAL_TABLET | Freq: Four times a day (QID) | ORAL | Status: DC | PRN

## 2022-03-28 NOTE — ED Provider Triage Note (Signed)
Emergency Medicine Provider Triage Evaluation Note  Hector Lopez , a 76 y.o. male  was evaluated in triage.  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 Alen Blew oncology Was recently on prednisone for nonspecific back and side pain without a diagnosis.  Has had negative MRIs and CT scans. Sent by PCP today with elevated blood sugar and nausea.  Review of Systems  Positive:, vomiting, back pain Negative: Chest pain, shortness of breath  Physical Exam  BP (!) 118/96 (BP Location: Left Arm)   Pulse 92   Temp 97.9 F (36.6 C) (Oral)   Resp 18   SpO2 95%  Gen:   Awake, no distress   Resp:  Normal effort  MSK:   Moves extremities without difficulty  Other:    Medical Decision Making  Medically screening exam initiated at 11:52 AM.  Appropriate orders placed.  Hector Lopez was informed that the remainder of the evaluation will be completed by another provider, this initial triage assessment does not replace that evaluation, and the importance of remaining in the ED until their evaluation is complete.  Hyperglycemia, rule out DKA, recent evaluation for same   Ezequiel Essex, MD 03/28/22 1157

## 2022-03-28 NOTE — ED Triage Notes (Signed)
Patient c/o hyperglycemia ongoing since taking prednisone to manage back pain. Patient reports recent chemo and immunotherapy with kidney removal. Not currently taking prednisone. Reports taking metformin and glipizide as prescribed.
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
## 2022-03-28 NOTE — ED Provider Notes (Signed)
[facility] DEPT Provider Note   CSN: [id number] Arrival date & time: 03/28/22  1134     History  Chief Complaint  Patient presents with   Hyperglycemia   Back Pain    Hector Lopez is a 76 y.o. male.  Patient presents from PCPs office with hyperglycemia.  This apparently onset after taking prednisone for a ongoing pain in his back pain for several weeks.  States for the past 5 weeks has had pain to his right flank and mid back of uncertain etiology.  He has had multiple ED visits for this and multiple CT scans and MRIs without definitive diagnosis.  He was seen in the ED 3 days ago with hyperglycemia which improved with IV fluids and insulin.  He has not had any prednisone for the past 2 days.  He saw his PCP today who again referred him to the ED for hyperglycemia.  Normally at home he takes glipizide and metformin but no insulin.  Sugar was 415 on arrival.  He has had fatigue, nausea and poor appetite but no vomiting, cough or fever.  No shortness of breath.  No pain with urination or blood in the urine. he was put on the prednisone in attempt to control this pain of uncertain etiology.  His MRI was negative of his thoracic and lumbar spine that showed no metastatic disease butdid show chronic compression fractures.  He does have a known left inguinal hernia but this is not the side of his pain  Discussed with patient's PCP Dr. Pleas Koch [phone].  He states patient was quite miserable given this pain he has been having for the past several weeks without clear diagnosis.  It is thought this pain is due to reaction from his immunotherapy.  No cause has been elucidated on imaging. Dr. Pleas Koch has spoken with Dr. Alen Blew of oncology and both would hope that patient can be admitted today for pain control and further investigation  The history is provided by the patient.  Hyperglycemia Associated symptoms: fatigue   Associated symptoms: no abdominal pain, no chest  pain, no dysuria, no fever, no nausea, no shortness of breath, no vomiting and no weakness   Back Pain Associated symptoms: no abdominal pain, no chest pain, no dysuria, no fever, no headaches and no weakness        Home Medications Prior to Admission medications   Medication Sig Start Date End Date Taking? Authorizing Provider  ADVIL 200 MG CAPS Take 400 mg by mouth every 4 (four) hours as needed (FOR PAIN).    [provider]  amLODipine (NORVASC) 10 MG tablet Take 10 mg by mouth at bedtime.     [provider]  bisacodyl (DULCOLAX) 10 MG suppository Place 1 suppository (10 mg total) rectally daily as needed for up to 12 doses for moderate constipation. Patient not taking: Reported on 03/25/2022 03/05/22   Wyvonnia Dusky, MD  cetirizine (ZYRTEC) 10 MG tablet Take 10 mg by mouth in the morning.    [provider]  cimetidine (TAGAMET) 200 MG tablet Take 200 mg by mouth daily as needed (for indigestion).    [provider]  finasteride (PROSCAR) 5 MG tablet Take 5 mg by mouth daily.    [provider]  glipiZIDE (GLUCOTROL) 10 MG tablet Take 10 mg by mouth daily before breakfast.     [provider]  HYDROcodone-acetaminophen (NORCO) 5-325 MG tablet Take 1 tablet by mouth every 6 (six) hours as needed for moderate  pain. Patient not taking: Reported on 03/25/2022 03/13/22   Wyatt Portela, MD  HYDROmorphone (DILAUDID) 2 MG tablet Take 1 tablet (2 mg total) by mouth every 6 (six) hours as needed for up to 15 doses for severe pain. Patient not taking: Reported on 03/25/2022 03/05/22   Wyvonnia Dusky, MD  hydrOXYzine (ATARAX) 10 MG tablet Take 1 tablet (10 mg total) by mouth 3 (three) times daily as needed. Patient not taking: Reported on 03/25/2022 12/24/21   Wyatt Portela, MD  lidocaine-prilocaine (EMLA) cream Apply 1 application topically as needed. Patient not taking: Reported on 03/25/2022 06/20/20   Wyatt Portela, MD  lisinopril  (ZESTRIL) 40 MG tablet Take 40 mg by mouth at bedtime.     [provider]  metFORMIN (GLUCOPHAGE) 1000 MG tablet Take 1,000 mg by mouth in the morning and at bedtime.    [provider]  prochlorperazine (COMPAZINE) 10 MG tablet Take 1 tablet (10 mg total) by mouth every 6 (six) hours as needed for nausea or vomiting. Patient not taking: Reported on 03/25/2022 10/17/21   Wyatt Portela, MD  senna-docusate (SENOKOT-S) 8.6-50 MG tablet Take 1 tablet by mouth 2 (two) times daily. While taking strong pain meds to prevent cosntipation. Patient not taking: Reported on 03/25/2022 01/20/20   Alexis Frock, MD  senna-docusate (SENOKOT-S) 8.6-50 MG tablet Take 1 tablet by mouth daily. Patient not taking: Reported on 03/25/2022 03/05/22   Wyvonnia Dusky, MD  sertraline (ZOLOFT) 50 MG tablet Take 50 mg by mouth in the morning.    [provider]  simvastatin (ZOCOR) 20 MG tablet Take 20 mg by mouth at bedtime.    [provider]  sodium phosphate (FLEET) 7-19 GM/118ML ENEM Place 133 mLs (1 enema total) rectally daily as needed for up to 1 dose for severe constipation. Patient not taking: Reported on 03/25/2022 03/05/22   Wyvonnia Dusky, MD  sulfamethoxazole-trimethoprim (BACTRIM DS) 800-160 MG tablet Take 1 tablet by mouth daily. Start taking one day prior to your appointment for your first follow-up and catheter removal.  Continue taking for three days. Patient not taking: Reported on 03/25/2022 04/14/20   Ardis Hughs, MD  traMADol (ULTRAM) 50 MG tablet Take 1 tablet (50 mg total) by mouth every 6 (six) hours as needed. Patient not taking: Reported on 03/25/2022 02/19/22   Wyatt Portela, MD  triamcinolone cream (KENALOG) 0.1 % Apply 1 application. topically 2 (two) times daily. Patient not taking: Reported on 03/25/2022 12/24/21   Wyatt Portela, MD  TYLENOL 325 MG CAPS Take 650 mg by mouth every 4 (four) hours as needed (for pain).    [provider]       Allergies    Codeine    Review of Systems   Review of Systems  Constitutional:  Positive for activity change, appetite change and fatigue. Negative for fever.  HENT:  Negative for congestion and rhinorrhea.   Respiratory:  Negative for cough, chest tightness and shortness of breath.   Cardiovascular:  Negative for chest pain.  Gastrointestinal:  Negative for abdominal pain, nausea and vomiting.  Genitourinary:  Negative for dysuria and hematuria.  Musculoskeletal:  Positive for back pain.  Skin:  Negative for rash.  Neurological:  Negative for weakness and headaches.   all other systems are negative except as noted in the HPI and PMH.    Physical Exam Updated Vital Signs BP (!) 118/96 (BP Location: Left Arm)   Pulse 92  Temp 97.9 F (36.6 C) (Oral)   Resp 18   SpO2 95%  Physical Exam Vitals and nursing note reviewed.  Constitutional:      General: He is not in acute distress.    Appearance: He is well-developed.  HENT:     Head: Normocephalic and atraumatic.     Mouth/Throat:     Pharynx: No oropharyngeal exudate.  Eyes:     Conjunctiva/sclera: Conjunctivae normal.     Pupils: Pupils are equal, round, and reactive to light.  Neck:     Comments: No meningismus. Cardiovascular:     Rate and Rhythm: Normal rate and regular rhythm.     Heart sounds: Normal heart sounds. No murmur heard. Pulmonary:     Effort: Pulmonary effort is normal. No respiratory distress.     Breath sounds: Normal breath sounds.     Comments: No rash Chest:     Chest wall: Tenderness present.  Abdominal:     Palpations: Abdomen is soft.     Tenderness: There is no abdominal tenderness. There is no guarding or rebound.     Comments: Left inguinal hernia, soft and reducible  Genitourinary:    Comments: No testicular tenderness Musculoskeletal:        General: Tenderness present. Normal range of motion.     Cervical back: Normal range of motion and neck supple.  Skin:    General: Skin is  warm.  Neurological:     Mental Status: He is alert and oriented to person, place, and time.     Cranial Nerves: No cranial nerve deficit.     Motor: No abnormal muscle tone.     Coordination: Coordination normal.     Comments:  5/5 strength throughout. CN 2-12 intact.Equal grip strength.   Psychiatric:        Behavior: Behavior normal.     ED Results / Procedures / Treatments   Labs (all labs ordered are listed, but only abnormal results are displayed) Labs Reviewed  CBC WITH DIFFERENTIAL/PLATELET - Abnormal; Notable for the following components:      Result Value   RBC 4.21 (*)    Hemoglobin 12.4 (*)    HCT 36.8 (*)    Abs Immature Granulocytes 0.08 (*)    All other components within normal limits  COMPREHENSIVE METABOLIC PANEL - Abnormal; Notable for the following components:   Sodium 133 (*)    Chloride 96 (*)    Glucose, Bld 448 (*)    BUN 32 (*)    Creatinine, Ser 1.65 (*)    Albumin 3.3 (*)    AST 11 (*)    GFR, Estimated 43 (*)    All other components within normal limits  URINALYSIS, ROUTINE W REFLEX MICROSCOPIC - Abnormal; Notable for the following components:   Specific Gravity, Urine 1.032 (*)    Glucose, UA >=500 (*)    Ketones, ur 20 (*)    Protein, ur 30 (*)    Bacteria, UA RARE (*)    All other components within normal limits  BETA-HYDROXYBUTYRIC ACID - Abnormal; Notable for the following components:   Beta-Hydroxybutyric Acid 0.94 (*)    All other components within normal limits  CBG MONITORING, ED - Abnormal; Notable for the following components:   Glucose-Capillary 415 (*)    All other components within normal limits  I-STAT CHEM 8, ED - Abnormal; Notable for the following components:   Sodium 134 (*)    BUN 30 (*)    Creatinine, Ser 1.60 (*)  Glucose, Bld 439 (*)    All other components within normal limits  CBG MONITORING, ED - Abnormal; Notable for the following components:   Glucose-Capillary 260 (*)    All other components within normal  limits  CBG MONITORING, ED - Abnormal; Notable for the following components:   Glucose-Capillary 181 (*)    All other components within normal limits  LACTIC ACID, PLASMA  LACTIC ACID, PLASMA  CBC  COMPREHENSIVE METABOLIC PANEL  HEMOGLOBIN A1C    EKG EKG Interpretation  Date/Time:  Friday March 28 2022 12:06:49 EDT Ventricular Rate:  79 PR Interval:  160 QRS Duration: 88 QT Interval:  362 QTC Calculation: 415 R Axis:   61 Text Interpretation: Sinus rhythm No significant change was found Confirmed by Ezequiel Essex [phone]) on 03/28/2022 12:16:26 PM  Radiology CT Angio Chest PE W and/or Wo Contrast  Result Date: 03/28/2022 CLINICAL DATA:  Patient with recent diagnosis of ureteral carcinoma. Weight loss. EXAM: CT ANGIOGRAPHY CHEST WITH CONTRAST TECHNIQUE: Multidetector CT imaging of the chest was performed using the standard protocol during bolus administration of intravenous contrast. Multiplanar CT image reconstructions and MIPs were obtained to evaluate the vascular anatomy. RADIATION DOSE REDUCTION: This exam was performed according to the departmental dose-optimization program which includes automated exposure control, adjustment of the mA and/or kV according to patient size and/or use of iterative reconstruction technique. CONTRAST:  37m OMNIPAQUE IOHEXOL 350 MG/ML SOLN COMPARISON:  CT C AP Feb 09, 2022. FINDINGS: Cardiovascular: Right anterior chest wall Port-A-Cath is present with tip terminating in the superior vena cava. Normal heart size. Trace fluid superior pericardial recess. Thoracic aortic vascular calcifications. Motion artifact limits evaluation within the left lower lobe. No evidence for acute pulmonary embolus. Mediastinum/Nodes: No enlarged axillary, mediastinal or hilar lymphadenopathy. Normal appearance of the esophagus. Lungs/Pleura: Central airways are patent. Dependent atelectasis within the lower lobes bilaterally. No pleural effusion or pneumothorax. Upper Abdomen:  Cholelithiasis.  No acute process. Musculoskeletal: Stable 6 mm sclerotic lesion within the left first rib (image 10; series 6), likely bone island. Stable sclerotic lesion within the T11 vertebral body measuring 9 mm (image 89; series 9), potentially bone island. Review of the MIP images confirms the above findings. IMPRESSION: No evidence for acute pulmonary embolus. Cholelithiasis Small sclerotic lesion within the left first rib, stable. Additionally, small sclerotic lesion within the T11 vertebral body, stable, likely representing bone island. Recommend attention on follow-up exams. Aortic Atherosclerosis (ICD10-I70.0). Electronically Signed   By: DLovey NewcomerM.D.   On: 03/28/2022 13:58    Procedures Procedures    Medications Ordered in ED Medications  sodium chloride 0.9 % bolus 1,000 mL (has no administration in time range)    ED Course/ Medical Decision Making/ A&P                           Medical Decision Making Amount and/or Complexity of Data Reviewed Labs: ordered. Radiology: ordered and independent interpretation performed. Decision-making details documented in ED Course. ECG/medicine tests: ordered and independent interpretation performed. Decision-making details documented in ED Course.  Risk Prescription drug management. Decision regarding hospitalization.  Patient with ongoing hyperglycemia after being on prednisone for nonspecific back pain thought to be due to his immunotherapy. Has not had any prednisone for the past 2 days.  Patient given IV fluids and subcutaneous insulin here.  Anion gap is normal.  Does not appear to be in DKA.  After discussion with patient's PCP and oncologist he is quite  miserable from this pain of uncertain etiology despite extensive work-up.  Imaging obtained for pulmonary embolism which is negative.  Results reviewed and interpreted by me. Does have some sclerotic findings and old compression fractures of his spine.  BLood sugar  improving with fluids and insulin. Does not appear to be in DKA.  Uncertain etiology of his pain, has had extensive workup with MRI, CT, [place]. No evidence of acute surgical pathology. No evidence of spinal metastatic disease.   Pain may be from his compression fractures. No rash to suggest zoster.   Given uncontrolled pain as well as hyperglycemia, patient agreeable to admission per plan of PCP Dr. Pleas Koch and oncology Dr. Alen Blew.   D/w Dr. Olevia Bowens.        Final Clinical Impression(s) / ED Diagnoses Final diagnoses:  None    Rx / DC Orders ED Discharge Orders     None         Matthan Sledge, Annie Main, MD 03/28/22 1814

## 2022-03-29 DIAGNOSIS — C641 Malignant neoplasm of right kidney, except renal pelvis: Secondary | ICD-10-CM

## 2022-03-29 DIAGNOSIS — C7802 Secondary malignant neoplasm of left lung: Secondary | ICD-10-CM | POA: Diagnosis present

## 2022-03-29 DIAGNOSIS — E860 Dehydration: Secondary | ICD-10-CM | POA: Diagnosis present

## 2022-03-29 DIAGNOSIS — K8020 Calculus of gallbladder without cholecystitis without obstruction: Secondary | ICD-10-CM | POA: Diagnosis present

## 2022-03-29 DIAGNOSIS — C649 Malignant neoplasm of unspecified kidney, except renal pelvis: Secondary | ICD-10-CM | POA: Diagnosis not present

## 2022-03-29 DIAGNOSIS — R109 Unspecified abdominal pain: Secondary | ICD-10-CM | POA: Diagnosis present

## 2022-03-29 DIAGNOSIS — D649 Anemia, unspecified: Secondary | ICD-10-CM | POA: Diagnosis present

## 2022-03-29 DIAGNOSIS — F32A Depression, unspecified: Secondary | ICD-10-CM | POA: Diagnosis present

## 2022-03-29 DIAGNOSIS — C7951 Secondary malignant neoplasm of bone: Secondary | ICD-10-CM | POA: Diagnosis present

## 2022-03-29 DIAGNOSIS — E1165 Type 2 diabetes mellitus with hyperglycemia: Secondary | ICD-10-CM | POA: Diagnosis present

## 2022-03-29 DIAGNOSIS — C787 Secondary malignant neoplasm of liver and intrahepatic bile duct: Secondary | ICD-10-CM | POA: Diagnosis present

## 2022-03-29 DIAGNOSIS — E782 Mixed hyperlipidemia: Secondary | ICD-10-CM | POA: Diagnosis present

## 2022-03-29 DIAGNOSIS — C7801 Secondary malignant neoplasm of right lung: Secondary | ICD-10-CM | POA: Diagnosis present

## 2022-03-29 DIAGNOSIS — I10 Essential (primary) hypertension: Secondary | ICD-10-CM | POA: Diagnosis present

## 2022-03-29 DIAGNOSIS — S22080A Wedge compression fracture of T11-T12 vertebra, initial encounter for closed fracture: Secondary | ICD-10-CM | POA: Diagnosis not present

## 2022-03-29 DIAGNOSIS — K219 Gastro-esophageal reflux disease without esophagitis: Secondary | ICD-10-CM | POA: Diagnosis present

## 2022-03-29 DIAGNOSIS — Z9001 Acquired absence of eye: Secondary | ICD-10-CM | POA: Diagnosis not present

## 2022-03-29 DIAGNOSIS — E8889 Other specified metabolic disorders: Secondary | ICD-10-CM | POA: Diagnosis present

## 2022-03-29 DIAGNOSIS — M549 Dorsalgia, unspecified: Secondary | ICD-10-CM | POA: Diagnosis not present

## 2022-03-29 DIAGNOSIS — E871 Hypo-osmolality and hyponatremia: Secondary | ICD-10-CM | POA: Diagnosis present

## 2022-03-29 DIAGNOSIS — Z7982 Long term (current) use of aspirin: Secondary | ICD-10-CM | POA: Diagnosis not present

## 2022-03-29 DIAGNOSIS — Z79899 Other long term (current) drug therapy: Secondary | ICD-10-CM | POA: Diagnosis not present

## 2022-03-29 DIAGNOSIS — S32040A Wedge compression fracture of fourth lumbar vertebra, initial encounter for closed fracture: Secondary | ICD-10-CM | POA: Diagnosis not present

## 2022-03-29 DIAGNOSIS — C669 Malignant neoplasm of unspecified ureter: Secondary | ICD-10-CM | POA: Diagnosis not present

## 2022-03-29 DIAGNOSIS — R739 Hyperglycemia, unspecified: Secondary | ICD-10-CM | POA: Diagnosis not present

## 2022-03-29 DIAGNOSIS — Z20822 Contact with and (suspected) exposure to covid-19: Secondary | ICD-10-CM | POA: Diagnosis present

## 2022-03-29 DIAGNOSIS — C661 Malignant neoplasm of right ureter: Secondary | ICD-10-CM | POA: Diagnosis present

## 2022-03-29 DIAGNOSIS — N400 Enlarged prostate without lower urinary tract symptoms: Secondary | ICD-10-CM | POA: Diagnosis present

## 2022-03-29 DIAGNOSIS — Z7984 Long term (current) use of oral hypoglycemic drugs: Secondary | ICD-10-CM | POA: Diagnosis not present

## 2022-03-29 DIAGNOSIS — E1140 Type 2 diabetes mellitus with diabetic neuropathy, unspecified: Secondary | ICD-10-CM | POA: Diagnosis present

## 2022-03-29 DIAGNOSIS — R1084 Generalized abdominal pain: Secondary | ICD-10-CM | POA: Diagnosis not present

## 2022-03-29 DIAGNOSIS — N179 Acute kidney failure, unspecified: Secondary | ICD-10-CM | POA: Diagnosis present

## 2022-03-29 LAB — COMPREHENSIVE METABOLIC PANEL
ALT: 10 U/L (ref 0–44)
AST: 11 U/L — ABNORMAL LOW (ref 15–41)
Albumin: 3.1 g/dL — ABNORMAL LOW (ref 3.5–5.0)
Alkaline Phosphatase: 74 U/L (ref 38–126)
Anion gap: 10 (ref 5–15)
BUN: 20 mg/dL (ref 8–23)
CO2: 23 mmol/L (ref 22–32)
Calcium: 8.6 mg/dL — ABNORMAL LOW (ref 8.9–10.3)
Chloride: 101 mmol/L (ref 98–111)
Creatinine, Ser: 1.18 mg/dL (ref 0.61–1.24)
GFR, Estimated: >60 mL/min (ref >60)
Glucose, Bld: 282 mg/dL — ABNORMAL HIGH (ref 70–99)
Potassium: 4.9 mmol/L (ref 3.5–5.1)
Sodium: 134 mmol/L — ABNORMAL LOW (ref 135–145)
Total Bilirubin: 0.9 mg/dL (ref 0.3–1.2)
Total Protein: 6.3 g/dL — ABNORMAL LOW (ref 6.5–8.1)

## 2022-03-29 LAB — CBC
HCT: 35.4 % — ABNORMAL LOW (ref 39.0–52.0)
Hemoglobin: 11.6 g/dL — ABNORMAL LOW (ref 13.0–17.0)
MCH: 29.4 pg (ref 26.0–34.0)
MCHC: 32.8 g/dL (ref 30.0–36.0)
MCV: 89.8 fL (ref 80.0–100.0)
Platelets: 169 10*3/uL (ref 150–400)
RBC: 3.94 MIL/uL — ABNORMAL LOW (ref 4.22–5.81)
RDW: 13.5 % (ref 11.5–15.5)
WBC: 9.6 10*3/uL (ref 4.0–10.5)
nRBC: 0 % (ref 0.0–0.2)

## 2022-03-29 LAB — GLUCOSE, CAPILLARY
Glucose-Capillary: 230 mg/dL — ABNORMAL HIGH (ref 70–99)
Glucose-Capillary: 140 mg/dL — ABNORMAL HIGH (ref 70–99)
Glucose-Capillary: 176 mg/dL — ABNORMAL HIGH (ref 70–99)

## 2022-03-29 LAB — HEMOGLOBIN A1C
Hgb A1c MFr Bld: 10.8 % — ABNORMAL HIGH (ref 4.8–5.6)
Mean Plasma Glucose: 263.26 mg/dL

## 2022-03-29 MED ORDER — METOCLOPRAMIDE HCL 5 MG/ML IJ SOLN
10.0000 mg | Freq: Four times a day (QID) | INTRAMUSCULAR | Status: DC | PRN
  Administered 2022-03-29: 10 mg via INTRAVENOUS
  Filled 2022-03-29: qty 2

## 2022-03-29 MED ORDER — INSULIN GLARGINE-YFGN 100 UNIT/ML ~~LOC~~ SOLN
10.0000 [IU] | Freq: Every day | SUBCUTANEOUS | Status: DC
  Administered 2022-03-29 – 2022-03-31 (×3): 10 [IU] via SUBCUTANEOUS
  Filled 2022-03-29 (×3): qty 0.1

## 2022-03-29 MED ORDER — PROCHLORPERAZINE EDISYLATE 10 MG/2ML IJ SOLN
10.0000 mg | Freq: Once | INTRAMUSCULAR | Status: AC
  Administered 2022-03-29: 10 mg via INTRAVENOUS
  Filled 2022-03-29: qty 2

## 2022-03-29 MED ORDER — GABAPENTIN 300 MG PO CAPS
300.0000 mg | ORAL_CAPSULE | Freq: Three times a day (TID) | ORAL | Status: DC
  Administered 2022-03-29 – 2022-04-01 (×9): 300 mg via ORAL
  Filled 2022-03-29 (×9): qty 1

## 2022-03-29 MED ORDER — LIDOCAINE 5 % EX PTCH
1.0000 | MEDICATED_PATCH | CUTANEOUS | Status: DC
  Administered 2022-03-29 – 2022-04-03 (×5): 1 via TRANSDERMAL
  Filled 2022-03-29 (×7): qty 1

## 2022-03-29 MED ORDER — CHLORHEXIDINE GLUCONATE CLOTH 2 % EX PADS
6.0000 | MEDICATED_PAD | Freq: Every day | CUTANEOUS | Status: DC
  Administered 2022-03-30 – 2022-04-04 (×6): 6 via TOPICAL

## 2022-03-29 MED ORDER — PANTOPRAZOLE SODIUM 40 MG IV SOLR
40.0000 mg | Freq: Two times a day (BID) | INTRAVENOUS | Status: DC
  Administered 2022-03-29 – 2022-04-01 (×7): 40 mg via INTRAVENOUS
  Filled 2022-03-29 (×7): qty 10

## 2022-03-29 MED ORDER — SODIUM CHLORIDE 0.9% FLUSH
10.0000 mL | Freq: Two times a day (BID) | INTRAVENOUS | Status: DC
  Administered 2022-03-30 – 2022-04-04 (×9): 10 mL

## 2022-03-29 MED ORDER — SODIUM CHLORIDE 0.9% FLUSH
10.0000 mL | INTRAVENOUS | Status: DC | PRN
  Administered 2022-04-01 – 2022-04-04 (×2): 10 mL

## 2022-03-29 MED ORDER — OXYCODONE HCL 5 MG PO TABS
5.0000 mg | ORAL_TABLET | ORAL | Status: DC | PRN
  Administered 2022-03-29 – 2022-04-04 (×9): 10 mg via ORAL
  Filled 2022-03-29 (×10): qty 2

## 2022-03-29 MED ORDER — SODIUM CHLORIDE 0.9 % IV SOLN: INTRAVENOUS | Status: DC

## 2022-03-29 MED ORDER — ONDANSETRON HCL 4 MG/2ML IJ SOLN
4.0000 mg | Freq: Once | INTRAMUSCULAR | Status: AC
Start: 2022-03-29 — End: 2022-03-29
  Administered 2022-03-29: 4 mg via INTRAVENOUS
  Filled 2022-03-29: qty 2

## 2022-03-29 MED ORDER — METHOCARBAMOL 500 MG PO TABS
500.0000 mg | ORAL_TABLET | Freq: Four times a day (QID) | ORAL | Status: DC | PRN
  Administered 2022-03-30 – 2022-03-31 (×3): 500 mg via ORAL
  Filled 2022-03-29 (×3): qty 1

## 2022-03-29 MED ORDER — ALUM & MAG HYDROXIDE-SIMETH 200-200-20 MG/5ML PO SUSP
30.0000 mL | Freq: Four times a day (QID) | ORAL | Status: DC | PRN
  Administered 2022-03-29: 30 mL via ORAL
  Filled 2022-03-29: qty 30

## 2022-03-29 NOTE — Progress Notes (Signed)
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  Patient has had worsening flank/abdominal pain for the last 5 weeks and has had multiple ER visits and scans including CT of abdomen and pelvis/MRI of thoracolumbar spine.  On presentation, he was in severe pain requiring IV Dilaudid.  Work-up showed creatinine of 1.65; CTA chest was negative for PE; right upper quad ultrasound showed cholelithiasis without cholecystitis.  He was started on IV fluids.    Assessment & Plan:   Severe right flank/abdominal pain Cholelithiasis History of recent T12 and L4 compression fractures -Presented with worsening right flank/abdominal pain for 5 weeks and has had multiple ER visits and scans including CT of abdomen and pelvis/MRI of thoracolumbar spine.  He was initially prescribed prednisone as an outpatient with slight improvement in the pain but since then the pain has worsened. -Unclear cause of the pain.  We will consult general surgery: Spoke to Dr. Ninfa Linden.  He will see the patient in consultation. -Continue IV and oral opiates for now.  Add Protonix twice a day.  Continue Maalox as needed. -We will also add muscle relaxants as needed.  Start gabapentin 300 mg 3 times daily in case this is neuropathic pain. -If symptoms persist, might have to request GI evaluation  Acute kidney injury -Possibly from dehydration.  Creatinine 1.65 on presentation.  Improved to 1.18 today.  Treated with IV fluids.  ARB/ACE on hold  Hyponatremia -Mild.  Monitor.  Stage IV high-grade urothelial carcinoma of the right ureter status  post right nephroureterectomy with pelvic adenopathy -Follows up with Dr. Alen Blew.  Currently undergoing chemotherapy.  Outpatient follow-up with oncology.  Persistent hyperplasia Plan continue finasteride  Hypertension -Blood pressure on the higher side.  Continue amlodipine.  Lisinopril on hold.  Diabetes mellitus type 2 with hyperglycemia -A1c 10.8.  Hold oral regimen.  We will start long-acting insulin.  Continue CBGs with SSI.  Hyperlipidemia -Continue statin  Depression -continue sertraline   DVT prophylaxis: Lovenox  code Status: Full Family Communication: None at bedside Disposition Plan: Status is: Observation The patient will require care spanning > 2 midnights and should be moved to inpatient because: Of severity of illness    Consultants: General surgery  Procedures: None  Antimicrobials: None   Subjective: Patient seen and examined at bedside.  Still complains of intermittent right flank pain.  No fever, chest pain, worsening shortness of breath reported.  Objective: Vitals:   03/29/22 0303 03/29/22 0715 03/29/22 0810 03/29/22 1251  BP: (!) 169/74 (!) 185/92 (!) 168/83 (!) 151/80  Pulse: 76 88 84 77  Resp: '20 18  18  '$ Temp: 98.1 F (36.7 C) 97.6 F (36.4 C)  98.4 F (36.9 C)  TempSrc:  Oral  Oral  SpO2: 94% 94%  95%    Intake/Output Summary (Last 24 hours) at 03/29/2022 1407 Last data filed at 03/29/2022 0300 Gross per 24 hour  Intake 1759.4 ml  Output --  Net 1759.4 ml   There were no vitals filed for this visit.  Examination:  General exam: Appears calm and comfortable.  Currently on room air. Respiratory system: Bilateral decreased breath sounds at bases Cardiovascular system: S1 & S2 heard, Rate controlled Gastrointestinal  system: Abdomen is nondistended, soft and mildly tender around the right flank region.  Normal bowel sounds heard. Extremities: No cyanosis, clubbing, edema  Central nervous system: Alert and oriented. No focal  neurological deficits. Moving extremities Skin: No rashes, lesions or ulcers Psychiatry: Affect is mostly flat.  No signs of agitation.  Data Reviewed: I have personally reviewed following labs and imaging studies  CBC: Recent Labs  Lab 03/25/22 1241 03/25/22 1415 03/28/22 1232 03/28/22 1248 03/29/22 0533  WBC 9.7 9.3 9.5  --  9.6  NEUTROABS 8.7* 8.5* 7.5  --   --   HGB 12.3* 12.2* 12.4* 13.3 11.6*  HCT 35.5* 34.8* 36.8* 39.0 35.4*  MCV 84.9 84.1 87.4  --  89.8  PLT 276 266 245  --  798   Basic Metabolic Panel: Recent Labs  Lab 03/25/22 1241 03/25/22 1415 03/28/22 1232 03/28/22 1248 03/29/22 0533  NA 125* 129* 133* 134* 134*  K 5.1 4.9 4.1 4.1 4.9  CL 93* 99 96* 98 101  CO2 25 19* 25  --  23  GLUCOSE 874* 861* 448* 439* 282*  BUN 40* 43* 32* 30* 20  CREATININE 1.80* 1.62* 1.65* 1.60* 1.18  CALCIUM 9.4 9.5 9.1  --  8.6*   GFR: Estimated Creatinine Clearance: 55 mL/min (by C-G formula based on SCr of 1.18 mg/dL). Liver Function Tests: Recent Labs  Lab 03/25/22 1241 03/25/22 1415 03/28/22 1232 03/29/22 0533  AST 9* 14* 11* 11*  ALT '13 17 13 10  '$ ALKPHOS 105 101 83 74  BILITOT 0.4 0.5 0.7 0.9  PROT 7.2 7.1 7.1 6.3*  ALBUMIN 3.8 3.5 3.3* 3.1*   Recent Labs  Lab 03/25/22 1415  LIPASE 77*   No results for input(s): "AMMONIA" in the last 168 hours. Coagulation Profile: No results for input(s): "INR", "PROTIME" in the last 168 hours. Cardiac Enzymes: No results for input(s): "CKTOTAL", "CKMB", "CKMBINDEX", "TROPONINI" in the last 168 hours. BNP (last 3 results) No results for input(s): "PROBNP" in the last 8760 hours. HbA1C: Recent Labs    03/29/22 0533  HGBA1C 10.8*   CBG: Recent Labs  Lab 03/28/22 1144 03/28/22 1548 03/28/22 1656 03/28/22 2201 03/29/22 1122  GLUCAP 415* 260* 181* 225* 230*   Lipid Profile: No results for input(s): "CHOL", "HDL", "LDLCALC", "TRIG", "CHOLHDL", "LDLDIRECT" in the last 72 hours. Thyroid Function Tests: No  results for input(s): "TSH", "T4TOTAL", "FREET4", "T3FREE", "THYROIDAB" in the last 72 hours. Anemia Panel: No results for input(s): "VITAMINB12", "FOLATE", "FERRITIN", "TIBC", "IRON", "RETICCTPCT" in the last 72 hours. Sepsis Labs: Recent Labs  Lab 03/28/22 1232 03/28/22 1406  LATICACIDVEN 1.9 1.3    Recent Results (from the past 240 hour(s))  Resp Panel by RT-PCR (Flu A&B, Covid) Anterior Nasal Swab     Status: None   Collection Time: 03/25/22  2:29 PM   Specimen: Anterior Nasal Swab  Result Value Ref Range Status   SARS Coronavirus 2 by RT PCR NEGATIVE NEGATIVE Final    Comment: (NOTE) SARS-CoV-2 target nucleic acids are NOT DETECTED.  The SARS-CoV-2 RNA is generally detectable in upper respiratory specimens during the acute phase of infection. The lowest concentration of SARS-CoV-2 viral copies this assay can detect is 138 copies/mL. A negative result does not preclude SARS-Cov-2 infection and should not be used as the sole basis for treatment or other patient management decisions. A negative result may occur with  improper specimen collection/handling, submission of specimen other than nasopharyngeal swab, presence of viral mutation(s) within the areas targeted by this  assay, and inadequate number of viral copies(<138 copies/mL). A negative result must be combined with clinical observations, patient history, and epidemiological information. The expected result is Negative.  Fact Sheet for Patients:  EntrepreneurPulse.com.au  Fact Sheet for Healthcare Providers:  IncredibleEmployment.be  This test is no t yet approved or cleared by the [place] FDA and  has been authorized for detection and/or diagnosis of SARS-CoV-2 by FDA under an Emergency Use Authorization (EUA). This EUA will remain  in effect (meaning this test can be used) for the duration of the COVID-19 declaration under Section 564(b)(1) of the Act, 21 U.S.C.section  360bbb-3(b)(1), unless the authorization is terminated  or revoked sooner.       Influenza A by PCR NEGATIVE NEGATIVE Final   Influenza B by PCR NEGATIVE NEGATIVE Final    Comment: (NOTE) The Xpert Xpress SARS-CoV-2/FLU/RSV plus assay is intended as an aid in the diagnosis of influenza from Nasopharyngeal swab specimens and should not be used as a sole basis for treatment. Nasal washings and aspirates are unacceptable for Xpert Xpress SARS-CoV-2/FLU/RSV testing.  Fact Sheet for Patients: EntrepreneurPulse.com.au  Fact Sheet for Healthcare Providers: IncredibleEmployment.be  This test is not yet approved or cleared by the [place] FDA and has been authorized for detection and/or diagnosis of SARS-CoV-2 by FDA under an Emergency Use Authorization (EUA). This EUA will remain in effect (meaning this test can be used) for the duration of the COVID-19 declaration under Section 564(b)(1) of the Act, 21 U.S.C. section 360bbb-3(b)(1), unless the authorization is terminated or revoked.  Performed at [facility], [place] [address], [place], [place] [address]          Radiology Studies: US Abdomen Limited RUQ (LIVER/GB)  Result Date: 03/28/2022 CLINICAL DATA:  Cholelithiasis EXAM: ULTRASOUND ABDOMEN LIMITED RIGHT UPPER QUADRANT COMPARISON:  Chest CT March 28, 2022 FINDINGS: Gallbladder: Multiple stones in the gallbladder lumen. No gallbladder wall thickening or pericholecystic fluid. Negative sonographic Murphy's sign. Common bile duct: Diameter: 4 mm Liver: No focal lesion identified. Within normal limits in parenchymal echogenicity. Portal vein is patent on color Doppler imaging with normal direction of blood flow towards the liver. Other: None. IMPRESSION: Cholelithiasis without secondary signs of acute cholecystitis. Electronically Signed   By: Lovey Newcomer M.D.   On: 03/28/2022 17:02   CT Angio Chest PE W and/or Wo  Contrast  Result Date: 03/28/2022 CLINICAL DATA:  Patient with recent diagnosis of ureteral carcinoma. Weight loss. EXAM: CT ANGIOGRAPHY CHEST WITH CONTRAST TECHNIQUE: Multidetector CT imaging of the chest was performed using the standard protocol during bolus administration of intravenous contrast. Multiplanar CT image reconstructions and MIPs were obtained to evaluate the vascular anatomy. RADIATION DOSE REDUCTION: This exam was performed according to the departmental dose-optimization program which includes automated exposure control, adjustment of the mA and/or kV according to patient size and/or use of iterative reconstruction technique. CONTRAST:  23m OMNIPAQUE IOHEXOL 350 MG/ML SOLN COMPARISON:  CT C AP Feb 09, 2022. FINDINGS: Cardiovascular: Right anterior chest wall Port-A-Cath is present with tip terminating in the superior vena cava. Normal heart size. Trace fluid superior pericardial recess. Thoracic aortic vascular calcifications. Motion artifact limits evaluation within the left lower lobe. No evidence for acute pulmonary embolus. Mediastinum/Nodes: No enlarged axillary, mediastinal or hilar lymphadenopathy. Normal appearance of the esophagus. Lungs/Pleura: Central airways are patent. Dependent atelectasis within the lower lobes bilaterally. No pleural effusion or pneumothorax. Upper Abdomen: Cholelithiasis.  No acute process. Musculoskeletal: Stable 6 mm sclerotic lesion within the left first  rib (image 10; series 6), likely bone island. Stable sclerotic lesion within the T11 vertebral body measuring 9 mm (image 89; series 9), potentially bone island. Review of the MIP images confirms the above findings. IMPRESSION: No evidence for acute pulmonary embolus. Cholelithiasis Small sclerotic lesion within the left first rib, stable. Additionally, small sclerotic lesion within the T11 vertebral body, stable, likely representing bone island. Recommend attention on follow-up exams. Aortic Atherosclerosis  (ICD10-I70.0). Electronically Signed   By: Lovey Newcomer M.D.   On: 03/28/2022 13:58        Scheduled Meds:  amLODipine  10 mg Oral QHS   aspirin EC  81 mg Oral q morning   calcitonin (salmon)  1 spray Alternating Nares Daily   [START ON 03/30/2022] Chlorhexidine Gluconate Cloth  6 each Topical Q0600   enoxaparin (LOVENOX) injection  40 mg Subcutaneous QHS   finasteride  5 mg Oral Daily   glipiZIDE  10 mg Oral Q breakfast   insulin aspart  0-15 Units Subcutaneous TID WC   latanoprost  1 drop Left Eye QHS   pantoprazole (PROTONIX) IV  40 mg Intravenous Q12H   senna-docusate  1 tablet Oral Daily   sertraline  50 mg Oral Q1200   simvastatin  20 mg Oral QHS   Continuous Infusions:        Aline August, MD [facility] 03/29/2022, 2:07 PM

## 2022-03-29 NOTE — Consult Note (Signed)
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 Normal appearance of the esophagus. Lungs/Pleura: Central airways are patent. Dependent atelectasis within the lower lobes bilaterally. No pleural effusion or pneumothorax. Upper Abdomen: Cholelithiasis.  No acute process. Musculoskeletal: Stable 6 mm sclerotic lesion within the left first rib (image 10; series 6), likely bone island. Stable sclerotic lesion within the T11 vertebral body measuring 9 mm (image 89; series 9), potentially bone island. Review of the MIP images confirms the above findings. IMPRESSION: No evidence for acute pulmonary embolus.  Cholelithiasis Small sclerotic lesion within the left first rib, stable. Additionally, small sclerotic lesion within the T11 vertebral body, stable, likely representing bone island. Recommend attention on follow-up exams. Aortic Atherosclerosis (ICD10-I70.0). Electronically Signed   By: Lovey Newcomer M.D.   On: 03/28/2022 13:58    I have personally reviewed the relevant scan   A/P: Right flank pain of uncertain etiology  Given the location and his symptoms including continuous nature of sharp pain, this does not appear to be consistent with biliary colic.  There is no evidence on any of his scans or on his physical examination that he has cholecystitis.  The pain does sound musculoskeletal/neurologic in nature. I did discuss symptomatic gallstones with the patient and his wife.  I explained to them that I cannot entirely rule out this as a cause of his discomfort although my suspicion is low.  We did discuss a potential diagnostic laparoscopy and possible cholecystectomy.  We discussed the risk of gallbladder surgery including the risk that this may not resolve any of his discomfort.  I do not believe a HIDA scan will add anything to this decision making.  I do not believe his inguinal hernias a cause of his current situation. There will be no plans for surgical intervention over the weekend but we will still follow him closely and will not rule out a diagnostic laparoscopy.  They understand and agree with the plans.  Complex medical decision making     Coralie Keens, MD [facility], [facility]

## 2022-03-30 DIAGNOSIS — N179 Acute kidney failure, unspecified: Secondary | ICD-10-CM | POA: Diagnosis not present

## 2022-03-30 DIAGNOSIS — I10 Essential (primary) hypertension: Secondary | ICD-10-CM | POA: Diagnosis not present

## 2022-03-30 DIAGNOSIS — E1165 Type 2 diabetes mellitus with hyperglycemia: Secondary | ICD-10-CM | POA: Diagnosis not present

## 2022-03-30 DIAGNOSIS — R109 Unspecified abdominal pain: Secondary | ICD-10-CM | POA: Diagnosis not present

## 2022-03-30 LAB — GLUCOSE, CAPILLARY
Glucose-Capillary: 164 mg/dL — ABNORMAL HIGH (ref 70–99)
Glucose-Capillary: 243 mg/dL — ABNORMAL HIGH (ref 70–99)
Glucose-Capillary: 110 mg/dL — ABNORMAL HIGH (ref 70–99)
Glucose-Capillary: 291 mg/dL — ABNORMAL HIGH (ref 70–99)

## 2022-03-30 LAB — COMPREHENSIVE METABOLIC PANEL
ALT: 9 U/L (ref 0–44)
AST: 10 U/L — ABNORMAL LOW (ref 15–41)
Albumin: 2.7 g/dL — ABNORMAL LOW (ref 3.5–5.0)
Alkaline Phosphatase: 64 U/L (ref 38–126)
Anion gap: 6 (ref 5–15)
BUN: 13 mg/dL (ref 8–23)
CO2: 24 mmol/L (ref 22–32)
Calcium: 8.3 mg/dL — ABNORMAL LOW (ref 8.9–10.3)
Chloride: 106 mmol/L (ref 98–111)
Creatinine, Ser: 0.95 mg/dL (ref 0.61–1.24)
GFR, Estimated: >60 mL/min (ref >60)
Glucose, Bld: 152 mg/dL — ABNORMAL HIGH (ref 70–99)
Potassium: 4.4 mmol/L (ref 3.5–5.1)
Sodium: 136 mmol/L (ref 135–145)
Total Bilirubin: 0.7 mg/dL (ref 0.3–1.2)
Total Protein: 5.8 g/dL — ABNORMAL LOW (ref 6.5–8.1)

## 2022-03-30 LAB — MAGNESIUM: Magnesium: 1.8 mg/dL (ref 1.7–2.4)

## 2022-03-30 NOTE — Progress Notes (Signed)
Subjective/Chief Complaint: Pt reports feeling much better today.  Pain is less  He reports the pain is bilaterally, more towards the back on both sides. Also has so low abd discomfort   Objective: Vital signs in last 24 hours: Temp:  [98 F (36.7 C)-98.6 F (37 C)] 98 F (36.7 C) (07/16 0521) Pulse Rate:  [77-85] 85 (07/15 2112) Resp:  [17-18] 18 (07/16 0521) BP: (150-168)/(76-83) 150/78 (07/16 0521) SpO2:  [92 %-97 %] 97 % (07/16 0521) Weight:  [79.7 kg] 79.7 kg (07/16 0407) Last BM Date : 03/28/22  Intake/Output from previous day: 07/15 0701 - 07/16 0700 In: 1230.3 [P.O.:120; I.V.:1110.3] Out: -  Intake/Output this shift: No intake/output data recorded.  Exam; Looks better today His abdomen is soft and non-tender especially in the RUQ, the L ing hernia easily reduces  Lab Results:  Recent Labs    03/28/22 1232 03/28/22 1248 03/29/22 0533  WBC 9.5  --  9.6  HGB 12.4* 13.3 11.6*  HCT 36.8* 39.0 35.4*  PLT 245  --  169   BMET Recent Labs    03/29/22 0533 03/30/22 0437  NA 134* 136  K 4.9 4.4  CL 101 106  CO2 23 24  GLUCOSE 282* 152*  BUN 20 13  CREATININE 1.18 0.95  CALCIUM 8.6* 8.3*   PT/INR No results for input(s): "LABPROT", "INR" in the last 72 hours. ABG No results for input(s): "PHART", "HCO3" in the last 72 hours.  Invalid input(s): "PCO2", "PO2"  Studies/Results: US Abdomen Limited RUQ (LIVER/GB)  Result Date: 03/28/2022 CLINICAL DATA:  Cholelithiasis EXAM: ULTRASOUND ABDOMEN LIMITED RIGHT UPPER QUADRANT COMPARISON:  Chest CT March 28, 2022 FINDINGS: Gallbladder: Multiple stones in the gallbladder lumen. No gallbladder wall thickening or pericholecystic fluid. Negative sonographic Murphy's sign. Common bile duct: Diameter: 4 mm Liver: No focal lesion identified. Within normal limits in parenchymal echogenicity. Portal vein is patent on color Doppler imaging with normal direction of blood flow towards the liver. Other: None. IMPRESSION:  Cholelithiasis without secondary signs of acute cholecystitis. Electronically Signed   By: Lovey Newcomer M.D.   On: 03/28/2022 17:02   CT Angio Chest PE W and/or Wo Contrast  Result Date: 03/28/2022 CLINICAL DATA:  Patient with recent diagnosis of ureteral carcinoma. Weight loss. EXAM: CT ANGIOGRAPHY CHEST WITH CONTRAST TECHNIQUE: Multidetector CT imaging of the chest was performed using the standard protocol during bolus administration of intravenous contrast. Multiplanar CT image reconstructions and MIPs were obtained to evaluate the vascular anatomy. RADIATION DOSE REDUCTION: This exam was performed according to the departmental dose-optimization program which includes automated exposure control, adjustment of the mA and/or kV according to patient size and/or use of iterative reconstruction technique. CONTRAST:  56m OMNIPAQUE IOHEXOL 350 MG/ML SOLN COMPARISON:  CT C AP Feb 09, 2022. FINDINGS: Cardiovascular: Right anterior chest wall Port-A-Cath is present with tip terminating in the superior vena cava. Normal heart size. Trace fluid superior pericardial recess. Thoracic aortic vascular calcifications. Motion artifact limits evaluation within the left lower lobe. No evidence for acute pulmonary embolus. Mediastinum/Nodes: No enlarged axillary, mediastinal or hilar lymphadenopathy. Normal appearance of the esophagus. Lungs/Pleura: Central airways are patent. Dependent atelectasis within the lower lobes bilaterally. No pleural effusion or pneumothorax. Upper Abdomen: Cholelithiasis.  No acute process. Musculoskeletal: Stable 6 mm sclerotic lesion within the left first rib (image 10; series 6), likely bone island. Stable sclerotic lesion within the T11 vertebral body measuring 9 mm (image 89; series 9), potentially bone island. Review of the MIP images confirms  the above findings. IMPRESSION: No evidence for acute pulmonary embolus. Cholelithiasis Small sclerotic lesion within the left first rib, stable.  Additionally, small sclerotic lesion within the T11 vertebral body, stable, likely representing bone island. Recommend attention on follow-up exams. Aortic Atherosclerosis (ICD10-I70.0). Electronically Signed   By: Lovey Newcomer M.D.   On: 03/28/2022 13:58    Anti-infectives: Anti-infectives (From admission, onward)    None       Assessment/Plan: Right flank pain of uncertain etiology  See my note from yesterday. Again, I doubt this pain is from the gallbladder/gallstones based on his history,exam, and xray findings. Overall he is improved from yesterday No plans for surgery right now    Coralie Keens 03/30/2022

## 2022-03-30 NOTE — Progress Notes (Addendum)
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  Patient has had worsening flank/abdominal pain for the last 5 weeks and has had multiple ER visits and scans including CT of abdomen and pelvis/MRI of thoracolumbar spine.  On presentation, he was in severe pain requiring IV Dilaudid.  Work-up showed creatinine of 1.65; CTA chest was negative for PE; right upper quad ultrasound showed cholelithiasis without cholecystitis.  He was started on IV fluids.  General surgery was consulted.   Assessment & Plan:   Severe right flank/abdominal pain Cholelithiasis History of recent T12 and L4 compression fractures -Presented with worsening right flank/abdominal pain for 5 weeks and has had multiple ER visits and scans including CT of abdomen and pelvis/MRI of thoracolumbar spine.  He was initially prescribed prednisone as an outpatient with slight improvement in the pain but since then the pain has worsened. -Unclear cause of the pain.   -Continue IV and oral opiates for now.  Continue oral Protonix along with muscle relaxants and gabapentin. -General surgery following.  Acute kidney injury -Possibly from dehydration.  Creatinine 1.65 on presentation.  Improved to 0.95 today.  Treated with IV fluids.  DC IV fluids today.  ARB/ACE on hold  Hyponatremia -Resolved.  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 Alen Blew.  Currently undergoing chemotherapy.  Outpatient follow-up with oncology.  Prostatic  hyperplasia -continue finasteride  Hypertension -Blood pressure on the higher side.  Continue amlodipine.  Monitor to resume lisinopril.  Diabetes mellitus type 2 with hyperglycemia -A1c 10.8.  Hold oral regimen.  Continue long-acting insulin.  Continue CBGs with SSI.  Hyperlipidemia -Continue statin  Depression -continue sertraline   DVT prophylaxis: Lovenox  code Status: Full Family Communication: None at bedside Disposition Plan: Status is: inpatient because: Of severity of illness  Consultants: General surgery  Procedures: None  Antimicrobials: None   Subjective: Patient seen and examined at bedside.  Complains of bilateral flank pain intermittently requiring IV Dilaudid.  Feels slightly better though.  No chest pain, worsening shortness of breath, fever reported.  Still has intermittent nausea.   Objective: Vitals:   03/29/22 1251 03/29/22 2112 03/30/22 0407 03/30/22 0521  BP: (!) 151/80 (!) 158/76  (!) 150/78  Pulse: 77 85    Resp: '18 17  18  '$ Temp: 98.4 F (36.9 C) 98.6 F (37 C)  98 F (36.7 C)  TempSrc: Oral Oral  Oral  SpO2: 95% 92%  97%  Weight:   79.7 kg     Intake/Output Summary (Last 24 hours) at 03/30/2022 0807 Last data filed at 03/30/2022 0300 Gross per 24 hour  Intake 1230.28 ml  Output --  Net 1230.28 ml    Filed Weights   03/30/22 0407  Weight: 79.7 kg    Examination:  General: On room air.  No distress ENT/neck: No thyromegaly.  JVD is not elevated  respiratory: Decreased breath sounds at bases bilaterally with some crackles; no wheezing  CVS: S1-S2 heard, rate controlled currently Abdominal: Soft, mildly tender in the right flank region, slightly distended; no organomegaly, six-point bowel sounds are heard Extremities: Trace lower extremity edema; no cyanosis  CNS: Awake and alert.  No focal neurologic deficit.  Moves extremities Lymph: No obvious lymphadenopathy Skin: No obvious ecchymosis/lesions  psych: Flat affect. Currently  not agitated musculoskeletal: No obvious joint swelling/deformity   Data Reviewed: I have personally reviewed following labs and imaging studies  CBC: Recent Labs  Lab 03/25/22 1241 03/25/22 1415 03/28/22 1232 03/28/22 1248 03/29/22 0533  WBC 9.7 9.3 9.5  --  9.6  NEUTROABS 8.7* 8.5* 7.5  --   --   HGB 12.3* 12.2* 12.4* 13.3 11.6*  HCT 35.5* 34.8* 36.8* 39.0 35.4*  MCV 84.9 84.1 87.4  --  89.8  PLT 276 266 245  --  852    Basic Metabolic Panel: Recent Labs  Lab 03/25/22 1241 03/25/22 1415 03/28/22 1232 03/28/22 1248 03/29/22 0533 03/30/22 0437  NA 125* 129* 133* 134* 134* 136  K 5.1 4.9 4.1 4.1 4.9 4.4  CL 93* 99 96* 98 101 106  CO2 25 19* 25  --  23 24  GLUCOSE 874* 861* 448* 439* 282* 152*  BUN 40* 43* 32* 30* 20 13  CREATININE 1.80* 1.62* 1.65* 1.60* 1.18 0.95  CALCIUM 9.4 9.5 9.1  --  8.6* 8.3*  MG  --   --   --   --   --  1.8    GFR: Estimated Creatinine Clearance: 68.3 mL/min (by C-G formula based on SCr of 0.95 mg/dL). Liver Function Tests: Recent Labs  Lab 03/25/22 1241 03/25/22 1415 03/28/22 1232 03/29/22 0533 03/30/22 0437  AST 9* 14* 11* 11* 10*  ALT '13 17 13 10 9  '$ ALKPHOS 105 101 83 74 64  BILITOT 0.4 0.5 0.7 0.9 0.7  PROT 7.2 7.1 7.1 6.3* 5.8*  ALBUMIN 3.8 3.5 3.3* 3.1* 2.7*    Recent Labs  Lab 03/25/22 1415  LIPASE 77*    No results for input(s): "AMMONIA" in the last 168 hours. Coagulation Profile: No results for input(s): "INR", "PROTIME" in the last 168 hours. Cardiac Enzymes: No results for input(s): "CKTOTAL", "CKMB", "CKMBINDEX", "TROPONINI" in the last 168 hours. BNP (last 3 results) No results for input(s): "PROBNP" in the last 8760 hours. HbA1C: Recent Labs    03/29/22 0533  HGBA1C 10.8*    CBG: Recent Labs  Lab 03/28/22 2201 03/29/22 1122 03/29/22 1647 03/29/22 2110 03/30/22 0716  GLUCAP 225* 230* 140* 176* 164*    Lipid Profile: No results for input(s): "CHOL", "HDL", "LDLCALC", "TRIG", "CHOLHDL",  "LDLDIRECT" in the last 72 hours. Thyroid Function Tests: No results for input(s): "TSH", "T4TOTAL", "FREET4", "T3FREE", "THYROIDAB" in the last 72 hours. Anemia Panel: No results for input(s): "VITAMINB12", "FOLATE", "FERRITIN", "TIBC", "IRON", "RETICCTPCT" in the last 72 hours. Sepsis Labs: Recent Labs  Lab 03/28/22 1232 03/28/22 1406  LATICACIDVEN 1.9 1.3     Recent Results (from the past 240 hour(s))  Resp Panel by RT-PCR (Flu A&B, Covid) Anterior Nasal Swab     Status: None   Collection Time: 03/25/22  2:29 PM   Specimen: Anterior Nasal Swab  Result Value Ref Range Status   SARS Coronavirus 2 by RT PCR NEGATIVE NEGATIVE Final    Comment: (NOTE) SARS-CoV-2 target nucleic acids are NOT DETECTED.  The SARS-CoV-2 RNA is generally detectable in upper respiratory specimens during the acute phase of infection. The lowest concentration of SARS-CoV-2 viral copies this assay can detect is 138 copies/mL. A negative result does not preclude SARS-Cov-2 infection and should not be used as the sole basis for treatment or other patient management decisions. A negative result  may occur with  improper specimen collection/handling, submission of specimen other than nasopharyngeal swab, presence of viral mutation(s) within the areas targeted by this assay, and inadequate number of viral copies(<138 copies/mL). A negative result must be combined with clinical observations, patient history, and epidemiological information. The expected result is Negative.  Fact Sheet for Patients:  EntrepreneurPulse.com.au  Fact Sheet for Healthcare Providers:  IncredibleEmployment.be  This test is no t yet approved or cleared by the [place] FDA and  has been authorized for detection and/or diagnosis of SARS-CoV-2 by FDA under an Emergency Use Authorization (EUA). This EUA will remain  in effect (meaning this test can be used) for the duration of the COVID-19  declaration under Section 564(b)(1) of the Act, 21 U.S.C.section 360bbb-3(b)(1), unless the authorization is terminated  or revoked sooner.       Influenza A by PCR NEGATIVE NEGATIVE Final   Influenza B by PCR NEGATIVE NEGATIVE Final    Comment: (NOTE) The Xpert Xpress SARS-CoV-2/FLU/RSV plus assay is intended as an aid in the diagnosis of influenza from Nasopharyngeal swab specimens and should not be used as a sole basis for treatment. Nasal washings and aspirates are unacceptable for Xpert Xpress SARS-CoV-2/FLU/RSV testing.  Fact Sheet for Patients: EntrepreneurPulse.com.au  Fact Sheet for Healthcare Providers: IncredibleEmployment.be  This test is not yet approved or cleared by the [place] FDA and has been authorized for detection and/or diagnosis of SARS-CoV-2 by FDA under an Emergency Use Authorization (EUA). This EUA will remain in effect (meaning this test can be used) for the duration of the COVID-19 declaration under Section 564(b)(1) of the Act, 21 U.S.C. section 360bbb-3(b)(1), unless the authorization is terminated or revoked.  Performed at [facility], [place] [address], [place], [place] [address]          Radiology Studies: US Abdomen Limited RUQ (LIVER/GB)  Result Date: 03/28/2022 CLINICAL DATA:  Cholelithiasis EXAM: ULTRASOUND ABDOMEN LIMITED RIGHT UPPER QUADRANT COMPARISON:  Chest CT March 28, 2022 FINDINGS: Gallbladder: Multiple stones in the gallbladder lumen. No gallbladder wall thickening or pericholecystic fluid. Negative sonographic Murphy's sign. Common bile duct: Diameter: 4 mm Liver: No focal lesion identified. Within normal limits in parenchymal echogenicity. Portal vein is patent on color Doppler imaging with normal direction of blood flow towards the liver. Other: None. IMPRESSION: Cholelithiasis without secondary signs of acute cholecystitis. Electronically Signed   By: Lovey Newcomer M.D.    On: 03/28/2022 17:02   CT Angio Chest PE W and/or Wo Contrast  Result Date: 03/28/2022 CLINICAL DATA:  Patient with recent diagnosis of ureteral carcinoma. Weight loss. EXAM: CT ANGIOGRAPHY CHEST WITH CONTRAST TECHNIQUE: Multidetector CT imaging of the chest was performed using the standard protocol during bolus administration of intravenous contrast. Multiplanar CT image reconstructions and MIPs were obtained to evaluate the vascular anatomy. RADIATION DOSE REDUCTION: This exam was performed according to the departmental dose-optimization program which includes automated exposure control, adjustment of the mA and/or kV according to patient size and/or use of iterative reconstruction technique. CONTRAST:  27m OMNIPAQUE IOHEXOL 350 MG/ML SOLN COMPARISON:  CT C AP Feb 09, 2022. FINDINGS: Cardiovascular: Right anterior chest wall Port-A-Cath is present with tip terminating in the superior vena cava. Normal heart size. Trace fluid superior pericardial recess. Thoracic aortic vascular calcifications. Motion artifact limits evaluation within the left lower lobe. No evidence for acute pulmonary embolus. Mediastinum/Nodes: No enlarged axillary, mediastinal or hilar lymphadenopathy. Normal appearance of the esophagus. Lungs/Pleura: Central airways are patent. Dependent atelectasis within the lower  lobes bilaterally. No pleural effusion or pneumothorax. Upper Abdomen: Cholelithiasis.  No acute process. Musculoskeletal: Stable 6 mm sclerotic lesion within the left first rib (image 10; series 6), likely bone island. Stable sclerotic lesion within the T11 vertebral body measuring 9 mm (image 89; series 9), potentially bone island. Review of the MIP images confirms the above findings. IMPRESSION: No evidence for acute pulmonary embolus. Cholelithiasis Small sclerotic lesion within the left first rib, stable. Additionally, small sclerotic lesion within the T11 vertebral body, stable, likely representing bone island. Recommend  attention on follow-up exams. Aortic Atherosclerosis (ICD10-I70.0). Electronically Signed   By: Lovey Newcomer M.D.   On: 03/28/2022 13:58        Scheduled Meds:  amLODipine  10 mg Oral QHS   aspirin EC  81 mg Oral q morning   calcitonin (salmon)  1 spray Alternating Nares Daily   Chlorhexidine Gluconate Cloth  6 each Topical Q0600   enoxaparin (LOVENOX) injection  40 mg Subcutaneous QHS   finasteride  5 mg Oral Daily   gabapentin  300 mg Oral TID   insulin aspart  0-15 Units Subcutaneous TID WC   insulin glargine-yfgn  10 Units Subcutaneous Daily   latanoprost  1 drop Left Eye QHS   lidocaine  1 patch Transdermal Q24H   pantoprazole (PROTONIX) IV  40 mg Intravenous Q12H   senna-docusate  1 tablet Oral Daily   sertraline  50 mg Oral Q1200   simvastatin  20 mg Oral QHS   sodium chloride flush  10-40 mL Intracatheter Q12H   Continuous Infusions:  sodium chloride 100 mL/hr at 03/29/22 1547          Rechel Delosreyes, MD [facility] 03/30/2022, 8:07 AM

## 2022-03-31 ENCOUNTER — Encounter (HOSPITAL_COMMUNITY): Payer: Self-pay | Admitting: Internal Medicine

## 2022-03-31 DIAGNOSIS — C641 Malignant neoplasm of right kidney, except renal pelvis: Secondary | ICD-10-CM | POA: Diagnosis not present

## 2022-03-31 DIAGNOSIS — R739 Hyperglycemia, unspecified: Secondary | ICD-10-CM | POA: Diagnosis not present

## 2022-03-31 DIAGNOSIS — M549 Dorsalgia, unspecified: Secondary | ICD-10-CM

## 2022-03-31 DIAGNOSIS — E1165 Type 2 diabetes mellitus with hyperglycemia: Secondary | ICD-10-CM | POA: Diagnosis not present

## 2022-03-31 DIAGNOSIS — C669 Malignant neoplasm of unspecified ureter: Secondary | ICD-10-CM | POA: Diagnosis not present

## 2022-03-31 DIAGNOSIS — S22080A Wedge compression fracture of T11-T12 vertebra, initial encounter for closed fracture: Secondary | ICD-10-CM

## 2022-03-31 DIAGNOSIS — I10 Essential (primary) hypertension: Secondary | ICD-10-CM | POA: Diagnosis not present

## 2022-03-31 DIAGNOSIS — N179 Acute kidney failure, unspecified: Secondary | ICD-10-CM | POA: Diagnosis not present

## 2022-03-31 DIAGNOSIS — S32040A Wedge compression fracture of fourth lumbar vertebra, initial encounter for closed fracture: Secondary | ICD-10-CM

## 2022-03-31 LAB — GLUCOSE, CAPILLARY
Glucose-Capillary: 265 mg/dL — ABNORMAL HIGH (ref 70–99)
Glucose-Capillary: 186 mg/dL — ABNORMAL HIGH (ref 70–99)
Glucose-Capillary: 318 mg/dL — ABNORMAL HIGH (ref 70–99)
Glucose-Capillary: 149 mg/dL — ABNORMAL HIGH (ref 70–99)
Glucose-Capillary: 149 mg/dL — ABNORMAL HIGH (ref 70–99)
Glucose-Capillary: 174 mg/dL — ABNORMAL HIGH (ref 70–99)

## 2022-03-31 MED ORDER — ORAL CARE MOUTH RINSE: 15.0000 mL | OROMUCOSAL | Status: DC | PRN

## 2022-03-31 MED ORDER — HYDROMORPHONE HCL 1 MG/ML IJ SOLN
0.5000 mg | INTRAMUSCULAR | Status: DC | PRN
  Administered 2022-03-31 – 2022-04-03 (×9): 0.5 mg via INTRAVENOUS
  Filled 2022-03-31 (×9): qty 0.5

## 2022-03-31 MED ORDER — NEPRO/CARBSTEADY PO LIQD
237.0000 mL | Freq: Three times a day (TID) | ORAL | Status: DC
  Administered 2022-03-31 – 2022-04-02 (×7): 237 mL via ORAL
  Filled 2022-03-31 (×12): qty 237

## 2022-03-31 MED ORDER — SENNOSIDES-DOCUSATE SODIUM 8.6-50 MG PO TABS
1.0000 | ORAL_TABLET | Freq: Two times a day (BID) | ORAL | Status: DC
  Administered 2022-03-31 – 2022-04-04 (×8): 1 via ORAL
  Filled 2022-03-31 (×8): qty 1

## 2022-03-31 MED ORDER — INSULIN GLARGINE-YFGN 100 UNIT/ML ~~LOC~~ SOLN
15.0000 [IU] | Freq: Every day | SUBCUTANEOUS | Status: DC
  Administered 2022-04-01 – 2022-04-04 (×4): 15 [IU] via SUBCUTANEOUS
  Filled 2022-03-31 (×4): qty 0.15

## 2022-03-31 MED ORDER — BISACODYL 10 MG RE SUPP: 10.0000 mg | Freq: Every day | RECTAL | Status: DC | PRN

## 2022-03-31 MED ORDER — HYDROMORPHONE HCL 1 MG/ML IJ SOLN
0.5000 mg | INTRAMUSCULAR | Status: DC | PRN
Start: 2022-03-31 — End: 2022-03-31

## 2022-03-31 MED ORDER — POLYETHYLENE GLYCOL 3350 17 G PO PACK: 17.0000 g | PACK | Freq: Every day | ORAL | Status: DC | PRN

## 2022-03-31 MED ORDER — METHOCARBAMOL 500 MG PO TABS
1000.0000 mg | ORAL_TABLET | Freq: Four times a day (QID) | ORAL | Status: DC | PRN
  Administered 2022-03-31 – 2022-04-04 (×5): 1000 mg via ORAL
  Filled 2022-03-31 (×5): qty 2

## 2022-03-31 MED ORDER — ADULT MULTIVITAMIN W/MINERALS CH
1.0000 | ORAL_TABLET | Freq: Every day | ORAL | Status: DC
  Administered 2022-04-01 – 2022-04-04 (×4): 1 via ORAL
  Filled 2022-03-31 (×4): qty 1

## 2022-03-31 NOTE — Progress Notes (Signed)
IP PROGRESS NOTE  Subjective:   Hector Lopez known to me with history of advanced urothelial carcinoma.  Please see my previous office progress notes for more detail.  He is currently receiving nivolumab with overall disease that is stable.  Imaging studies from June 2023 does not show progressive disease.  He has very limited para-aortic lymph node.  He has been experiencing back pain and flank pain persistently without any clear-cut etiology.  Multiple imaging studies including MRI of the spine did not show any evidence of metastatic disease or malignancy to explain his pain.  He was a started on prednisone that caused severe hypoglycemia and his current hospitalization.  Clinically, he continues to have issues with pain being uncomfortable laying in bed.  He is under evaluation for possible cholecystitis but does not appear to be a contributing factor.  Objective:  Vital signs in last 24 hours: Temp:  [98.6 F (37 C)-98.9 F (37.2 C)] 98.9 F (37.2 C) (07/17 0429) Pulse Rate:  [73-91] 73 (07/17 0429) Resp:  [14-18] 14 (07/17 0429) BP: (122-132)/(58-74) 129/58 (07/17 0429) SpO2:  [97 %-98 %] 97 % (07/17 0429) Weight:  [179 lb 14.3 oz (81.6 kg)] 179 lb 14.3 oz (81.6 kg) (07/17 0429) Weight change: 4 lb 4.7 oz (1.948 kg) Last BM Date : 03/28/22  Intake/Output from previous day: 07/16 0701 - 07/17 0700 In: 1034.6 [P.O.:340; I.V.:694.6] Out: 400 [Urine:400] General: Alert, awake without distress. Head: Normocephalic atraumatic. Mouth: mucous membranes moist, pharynx normal without lesions Eyes: No scleral icterus.  Pupils are equal and round reactive to light. Resp: clear to auscultation bilaterally without rhonchi or wheezes or dullness to percussion. Cardio: regular rate and rhythm, S1, S2 normal, no murmur, click, rub or gallop GI: soft, non-tender; bowel sounds normal; no masses,  no organomegaly Musculoskeletal: No joint deformity or effusion. Neurological: No motor, sensory  deficits.  Intact deep tendon reflexes. Skin: No rashes or lesions.   Lab Results: Recent Labs    03/28/22 1232 03/28/22 1248 03/29/22 0533  WBC 9.5  --  9.6  HGB 12.4* 13.3 11.6*  HCT 36.8* 39.0 35.4*  PLT 245  --  169    BMET Recent Labs    03/29/22 0533 03/30/22 0437  NA 134* 136  K 4.9 4.4  CL 101 106  CO2 23 24  GLUCOSE 282* 152*  BUN 20 13  CREATININE 1.18 0.95  CALCIUM 8.6* 8.3*    Studies/Results: No results found.  Medications: I have reviewed the patient's current medications.  Assessment/Plan:  76 year old man with:   1.  Stage IV high-grade urothelial of the ureter with pelvic adenopathy diagnosed in 2023.    Imaging studies from June 2023 showed overall stable disease without any widespread metastasis to explain his pain.  I will hold off on any additional treatment at this time till his acute issues resolve.   2.  Back/flank pain: Unclear etiology at this time.  I doubt this is an autoimmune phenomenon and a trial of steroid has been overall unsuccessful and caused hypoglycemia.  I recommendation is to continue with supportive management per the primary team.  Consideration for palliative services involvement for pain management.  Trial of steroid at a higher dose could be considered with close monitoring of his blood sugar.      3.  Hyperglycemia: Management per the primary team with blood sugar 186.   4.  Compression fracture noted at T12 and L4 resulting in back pain.  He was evaluated by neurosurgery as  an outpatient and felt that this is not contributing to his pain.  Repeat evaluation while inpatient may be recommended if his pain persists.     35  minutes were dedicated to this visit.  50% of the time was face-to-face.  The time was spent on reviewing imaging studies, discussing treatment options, discussing differential diagnosis and answering questions regarding future plan.     LOS: 2 days   Zola Button 03/31/2022, 10:35 AM

## 2022-03-31 NOTE — Progress Notes (Signed)
  Transition of Care [facility]) Screening Note   Patient Details  Name: Hector Lopez Date of Birth: 1946/02/28   Transition of Care [facility]) CM/SW Contact:    Vassie Moselle, LCSW Phone Number: 03/31/2022, 8:33 AM    Transition of Care Department [facility]) has reviewed patient and no TOC needs have been identified at this time. We will continue to monitor patient advancement through interdisciplinary progression rounds. If new patient transition needs arise, please place a TOC consult.

## 2022-03-31 NOTE — Progress Notes (Signed)
Initial Nutrition Assessment  DOCUMENTATION CODES:   Not applicable  INTERVENTION:   Nepro Shake po TID, each supplement provides 425 kcal and 19 grams protein  MVI po daily   Liberalize diet  Pt at high refeed risk; recommend monitor potassium, magnesium and phosphorus labs daily until stable  NUTRITION DIAGNOSIS:   Increased nutrient needs related to cancer and cancer related treatments as evidenced by estimated needs.  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  Unable to reach pt by phone. Per chart review, pt reports that for the past 5 weeks has had pain to his right flank and mid back along with poor oral intake and weight loss of 14lbs in 4 weeks. Per chart, pt appears to be down 12lbs(6%) over the past 3 months and down 5 lbs(3%) over the past month. Pt documented to be eating 25-100% of meals in hospital. RD will add supplements and MVI to help pt meet his estimated needs. RD will also liberalize pt's diet. Pt remains at high refeed risk. RD will obtain nutrition related history and exam at follow-up. Pt is at high risk for malnutrition.   Medications reviewed and include: aspirin, calcitonin, lovenox, insulin, protonix, senokot  Labs reviewed: K 4.4 wnl, Mg 1.8 wnl Cbgs- 318, 186 x 24 hrs AIC 10.8(H)- 7/15  NUTRITION - FOCUSED PHYSICAL EXAM: Unable to perform at this time   Diet  Order:   Diet Order             Diet heart healthy/carb modified Room service appropriate? Yes; Fluid consistency: Thin  Diet effective now                  EDUCATION NEEDS:   Not appropriate for education at this time  Skin:  Skin Assessment: Reviewed RN Assessment  Last BM:  7/14  Height:   Ht Readings from Last 1 Encounters:  03/31/22 '5\' 10"'$  (1.778 m)    Weight:   Wt Readings from Last 1 Encounters:  03/31/22 81.6 kg    Ideal Body Weight:  75.4 kg  BMI:  Body mass index is 25.81 kg/m.  Estimated Nutritional Needs:   Kcal:  2100-2400kcal/day  Protein:  105-120g/day  Fluid:  1.9-2.2L/day  Koleen Distance MS, RD, LDN Please refer to [facility] for RD and/or RD on-call/weekend/after hours pager

## 2022-03-31 NOTE — Progress Notes (Signed)
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  Patient has had worsening flank/abdominal pain for the last 5 weeks and has had multiple ER visits and scans including CT of abdomen and pelvis/MRI of thoracolumbar spine.  On presentation, he was in severe pain requiring IV Dilaudid.  Work-up showed creatinine of 1.65; CTA chest was negative for PE; right upper quad ultrasound showed cholelithiasis without cholecystitis.  He was started on IV fluids.  General surgery was consulted.   Assessment & Plan:   Severe right flank/abdominal pain Cholelithiasis History of recent T12 and L4 compression fractures -Presented with worsening right flank/abdominal pain for 5 weeks and has had multiple ER visits and scans including CT of abdomen and pelvis/MRI of thoracolumbar spine.  He was initially prescribed prednisone as an outpatient with slight improvement in the pain but since then the pain has worsened. -Unclear cause of the pain.   -Continue IV and oral opiates for now.  Continue oral Protonix along with muscle relaxants and gabapentin.  Pain improving but still significant.  Increase dose of Robaxin today. -General surgery following.  Acute kidney injury -Possibly from dehydration.  Creatinine 1.65 on presentation.  Improved to 0.95 on 03/30/2022.  Off IV fluids.  ARB/ACE on hold  Hyponatremia -Resolved.  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 Alen Blew.  Currently undergoing chemotherapy.  Follow oncology  recommendations.  Prostatic hyperplasia -continue finasteride  Hypertension -Blood pressure currently stable.  Continue amlodipine.  Lisinopril on hold.  Diabetes mellitus type 2 with hyperglycemia -A1c 10.8.  Hold oral regimen.  Continue long-acting insulin.  Continue CBGs with SSI.  Hyperlipidemia -Continue statin  Depression -continue sertraline   DVT prophylaxis: Lovenox  code Status: Full Family Communication: None at bedside Disposition Plan: Status is: inpatient because: Of severity of illness  Consultants: General surgery  Procedures: None  Antimicrobials: None   Subjective: Patient seen and examined at bedside.  Still has intermittent bilateral flank pain requiring IV pain medication but feels slight improvement.  Denies any current nausea.  No fever, chest pain or shortness of breath reported. Objective: Vitals:   03/30/22 1349 03/30/22 1922 03/31/22 0429 03/31/22 0810  BP: 122/74 132/74 (!) 129/58   Pulse: 91 80 73   Resp: '18 14 14   '$ Temp:  98.6 F (37 C) 98.9 F (37.2 C)   TempSrc:  Oral Oral   SpO2: 98% 98% 97%   Weight:   81.6 kg   Height:    '5\' 10"'$  (1.778 m)    Intake/Output Summary (Last 24 hours) at 03/31/2022 1115 Last data filed at 03/31/2022 1014 Gross per 24 hour  Intake 348 ml  Output 400 ml  Net -52 ml    Filed Weights   03/30/22 0407 03/31/22 0429  Weight: 79.7 kg 81.6 kg    Examination:  General: No acute distress.  Still on room air.   ENT/neck: No obvious JVD elevation noted.  No palpable neck masses noted respiratory: Bilateral decreased breath sounds at bases with scattered crackles  CVS: Rate controlled; S1 and S2 are heard Abdominal: Soft, still mildly tender in the right flank  region, mild distention noted; no organomegaly, bowel sounds are heard Extremities: No clubbing; mild lower extremity edema present  CNS: Alert and oriented.  No focal neurologic deficit.  Able to move extremities lymph: No obvious palpable  lymphadenopathy noted Skin: No obvious petechiae/rashes  psych: No signs of agitation.  Affect is mostly flat. musculoskeletal: No obvious other joint swelling/deformity   Data Reviewed: I have personally reviewed following labs and imaging studies  CBC: Recent Labs  Lab 03/25/22 1241 03/25/22 1415 03/28/22 1232 03/28/22 1248 03/29/22 0533  WBC 9.7 9.3 9.5  --  9.6  NEUTROABS 8.7* 8.5* 7.5  --   --   HGB 12.3* 12.2* 12.4* 13.3 11.6*  HCT 35.5* 34.8* 36.8* 39.0 35.4*  MCV 84.9 84.1 87.4  --  89.8  PLT 276 266 245  --  235    Basic Metabolic Panel: Recent Labs  Lab 03/25/22 1241 03/25/22 1415 03/28/22 1232 03/28/22 1248 03/29/22 0533 03/30/22 0437  NA 125* 129* 133* 134* 134* 136  K 5.1 4.9 4.1 4.1 4.9 4.4  CL 93* 99 96* 98 101 106  CO2 25 19* 25  --  23 24  GLUCOSE 874* 861* 448* 439* 282* 152*  BUN 40* 43* 32* 30* 20 13  CREATININE 1.80* 1.62* 1.65* 1.60* 1.18 0.95  CALCIUM 9.4 9.5 9.1  --  8.6* 8.3*  MG  --   --   --   --   --  1.8    GFR: Estimated Creatinine Clearance: 68.3 mL/min (by C-G formula based on SCr of 0.95 mg/dL). Liver Function Tests: Recent Labs  Lab 03/25/22 1241 03/25/22 1415 03/28/22 1232 03/29/22 0533 03/30/22 0437  AST 9* 14* 11* 11* 10*  ALT '13 17 13 10 9  '$ ALKPHOS 105 101 83 74 64  BILITOT 0.4 0.5 0.7 0.9 0.7  PROT 7.2 7.1 7.1 6.3* 5.8*  ALBUMIN 3.8 3.5 3.3* 3.1* 2.7*    Recent Labs  Lab 03/25/22 1415  LIPASE 77*    No results for input(s): "AMMONIA" in the last 168 hours. Coagulation Profile: No results for input(s): "INR", "PROTIME" in the last 168 hours. Cardiac Enzymes: No results for input(s): "CKTOTAL", "CKMB", "CKMBINDEX", "TROPONINI" in the last 168 hours. BNP (last 3 results) No results for input(s): "PROBNP" in the last 8760 hours. HbA1C: Recent Labs    03/29/22 0533  HGBA1C 10.8*    CBG: Recent Labs  Lab 03/30/22 0716 03/30/22 1118 03/30/22 1629 03/30/22 2111 03/31/22 0736  GLUCAP 164* 243* 110*  291* 186*    Lipid Profile: No results for input(s): "CHOL", "HDL", "LDLCALC", "TRIG", "CHOLHDL", "LDLDIRECT" in the last 72 hours. Thyroid Function Tests: No results for input(s): "TSH", "T4TOTAL", "FREET4", "T3FREE", "THYROIDAB" in the last 72 hours. Anemia Panel: No results for input(s): "VITAMINB12", "FOLATE", "FERRITIN", "TIBC", "IRON", "RETICCTPCT" in the last 72 hours. Sepsis Labs: Recent Labs  Lab 03/28/22 1232 03/28/22 1406  LATICACIDVEN 1.9 1.3     Recent Results (from the past 240 hour(s))  Resp Panel by RT-PCR (Flu A&B, Covid) Anterior Nasal Swab     Status: None   Collection Time: 03/25/22  2:29 PM   Specimen: Anterior Nasal Swab  Result Value Ref Range Status   SARS Coronavirus 2 by RT PCR NEGATIVE NEGATIVE Final    Comment: (NOTE) SARS-CoV-2 target nucleic acids are NOT DETECTED.  The SARS-CoV-2 RNA is generally detectable in upper respiratory specimens during the acute phase of infection. The lowest concentration of SARS-CoV-2 viral copies this assay can detect is 138  copies/mL. A negative result does not preclude SARS-Cov-2 infection and should not be used as the sole basis for treatment or other patient management decisions. A negative result may occur with  improper specimen collection/handling, submission of specimen other than nasopharyngeal swab, presence of viral mutation(s) within the areas targeted by this assay, and inadequate number of viral copies(<138 copies/mL). A negative result must be combined with clinical observations, patient history, and epidemiological information. The expected result is Negative.  Fact Sheet for Patients:  EntrepreneurPulse.com.au  Fact Sheet for Healthcare Providers:  IncredibleEmployment.be  This test is no t yet approved or cleared by the [place] FDA and  has been authorized for detection and/or diagnosis of SARS-CoV-2 by FDA under an Emergency Use Authorization (EUA).  This EUA will remain  in effect (meaning this test can be used) for the duration of the COVID-19 declaration under Section 564(b)(1) of the Act, 21 U.S.C.section 360bbb-3(b)(1), unless the authorization is terminated  or revoked sooner.       Influenza A by PCR NEGATIVE NEGATIVE Final   Influenza B by PCR NEGATIVE NEGATIVE Final    Comment: (NOTE) The Xpert Xpress SARS-CoV-2/FLU/RSV plus assay is intended as an aid in the diagnosis of influenza from Nasopharyngeal swab specimens and should not be used as a sole basis for treatment. Nasal washings and aspirates are unacceptable for Xpert Xpress SARS-CoV-2/FLU/RSV testing.  Fact Sheet for Patients: EntrepreneurPulse.com.au  Fact Sheet for Healthcare Providers: IncredibleEmployment.be  This test is not yet approved or cleared by the [place] FDA and has been authorized for detection and/or diagnosis of SARS-CoV-2 by FDA under an Emergency Use Authorization (EUA). This EUA will remain in effect (meaning this test can be used) for the duration of the COVID-19 declaration under Section 564(b)(1) of the Act, 21 U.S.C. section 360bbb-3(b)(1), unless the authorization is terminated or revoked.  Performed at [facility], [place] [address], [place],  [address]          Radiology Studies: No results found.      Scheduled Meds:  amLODipine  10 mg Oral QHS   aspirin EC  81 mg Oral q morning   calcitonin (salmon)  1 spray Alternating Nares Daily   Chlorhexidine Gluconate Cloth  6 each Topical Q0600   enoxaparin (LOVENOX) injection  40 mg Subcutaneous QHS   finasteride  5 mg Oral Daily   gabapentin  300 mg Oral TID   insulin aspart  0-15 Units Subcutaneous TID WC   insulin glargine-yfgn  10 Units Subcutaneous Daily   latanoprost  1 drop Left Eye QHS   lidocaine  1 patch Transdermal Q24H   pantoprazole (PROTONIX) IV  40 mg Intravenous Q12H   senna-docusate  1  tablet Oral Daily   sertraline  50 mg Oral Q1200   simvastatin  20 mg Oral QHS   sodium chloride flush  10-40 mL Intracatheter Q12H   Continuous Infusions:         Aline August, MD [facility] 03/31/2022, 11:15 AM

## 2022-03-31 NOTE — Inpatient Diabetes Management (Addendum)
Inpatient Diabetes Program Recommendations  AACE/ADA: New Consensus Statement on Inpatient Glycemic Control (2015)  Target Ranges:  Prepandial:   less than 140 mg/dL      Peak postprandial:   less than 180 mg/dL (1-2 hours)      Critically ill patients:  140 - 180 mg/dL   Lab Results  Component Value Date   GLUCAP 318 (H) 03/31/2022   HGBA1C 10.8 (H) 03/29/2022    Review of Glycemic Control  Latest Reference Range & Units 03/30/22 07:16 03/30/22 11:18 03/30/22 16:29 03/30/22 21:11 03/31/22 07:36 03/31/22 11:51  Glucose-Capillary 70 - 99 mg/dL 164 (H) 243 (H) 110 (H) 291 (H) 186 (H) 318 (H)   Diabetes history: DM 2 Outpatient Diabetes medications: Glipizide 10 mg Daily, Metformin 1000 mg bid Current orders for Inpatient glycemic control:  Semglee 10 units Daily Novolog 0-15 units tid  A1c 10.8% on 7/15  Inpatient Diabetes Program Recommendations:    -   Add Novolog 4 units tid meal coverage if eating >50% of meals.  Will see pt today. Based on glucose trends pt may need insulin at d/c...  Addendum 2:00 pm: Spoke with pt at bedside regarding glucose control at home. Pt reports he needs a specific food plan and at times misses medication doses. Discussed importance of glucose control. Discussed current A1c of 10.8%. Discussed glucose and A1c goals. Will send pt food plans via email. Explained he will need insulin at d/c. Showed pt how to do insulin administration via insulin pen. Pt is willing to do insulin at home.  Thanks,  Tama Headings RN, MSN, BC-ADM Inpatient Diabetes Coordinator Team Pager [phone] (8a-5p)

## 2022-03-31 NOTE — Progress Notes (Signed)
Progress Note     Subjective: Pt reports more pain today in R flank, radiating to groin today. Denies n/v. He does report he thinks pain worsened after eating some watermelon early this AM. Pain is not reproducible on exam.   Objective: Vital signs in last 24 hours: Temp:  [98.6 F (37 C)-98.9 F (37.2 C)] 98.9 F (37.2 C) (07/17 0429) Pulse Rate:  [73-91] 73 (07/17 0429) Resp:  [14-18] 14 (07/17 0429) BP: (122-132)/(58-74) 129/58 (07/17 0429) SpO2:  [97 %-98 %] 97 % (07/17 0429) Weight:  [81.6 kg] 81.6 kg (07/17 0429) Last BM Date : 03/28/22  Intake/Output from previous day: 07/16 0701 - 07/17 0700 In: 1034.6 [P.O.:340; I.V.:694.6] Out: 400 [Urine:400] Intake/Output this shift: Total I/O In: 248 [P.O.:238; I.V.:10] Out: -   PE: General: pleasant, WD, WN malewho is laying in bed in NAD HEENT: sclera anicteric  Lungs:  Respiratory effort nonlabored Abd: soft, NT, negative murphy sign, ND, +BS MS: all 4 extremities are symmetrical with no cyanosis, clubbing, or edema. Psych: A&Ox3 with an appropriate affect.    Lab Results:  Recent Labs    03/28/22 1232 03/28/22 1248 03/29/22 0533  WBC 9.5  --  9.6  HGB 12.4* 13.3 11.6*  HCT 36.8* 39.0 35.4*  PLT 245  --  169   BMET Recent Labs    03/29/22 0533 03/30/22 0437  NA 134* 136  K 4.9 4.4  CL 101 106  CO2 23 24  GLUCOSE 282* 152*  BUN 20 13  CREATININE 1.18 0.95  CALCIUM 8.6* 8.3*   PT/INR No results for input(s): "LABPROT", "INR" in the last 72 hours. CMP     Component Value Date/Time   NA 136 03/30/2022 0437   K 4.4 03/30/2022 0437   CL 106 03/30/2022 0437   CO2 24 03/30/2022 0437   GLUCOSE 152 (H) 03/30/2022 0437   BUN 13 03/30/2022 0437   CREATININE 0.95 03/30/2022 0437   CREATININE 1.80 (H) 03/25/2022 1241   CALCIUM 8.3 (L) 03/30/2022 0437   PROT 5.8 (L) 03/30/2022 0437   ALBUMIN 2.7 (L) 03/30/2022 0437   AST 10 (L) 03/30/2022 0437   AST 9 (L) 03/25/2022 1241   ALT 9 03/30/2022 0437    ALT 13 03/25/2022 1241   ALKPHOS 64 03/30/2022 0437   BILITOT 0.7 03/30/2022 0437   BILITOT 0.4 03/25/2022 1241   GFRNONAA >60 03/30/2022 0437   GFRNONAA 39 (L) 03/25/2022 1241   GFRAA >60 04/11/2020 0947   Lipase     Component Value Date/Time   LIPASE 77 (H) 03/25/2022 1415       Studies/Results: No results found.  Anti-infectives: Anti-infectives (From admission, onward)    None        Assessment/Plan Right flank pain of uncertain etiology Cholelithiasis - pt does report some exacerbation of pain with eating but patient is not reproducible on abdominal exam today  - no signs of cholecystitis on imaging - will review with MD but suspicion for gallbladder etiology of pain remains fairly low   FEN: HH diet, SLIV VTE: LMWH ID: no current abx  - below per TRH -  Stage IV urothelial cancer  T2DM with Hyperglycemia s/p steroids Compression fractures of T12/L4 HTN HLD  LOS: 2 days   I reviewed Consultant oncology notes, hospitalist notes, last 24 h vitals and pain scores, last 48 h intake and output, last 24 h labs and trends, and last 24 h imaging results.     Norm Parcel, PA-C  [facility] 03/31/2022, 11:02 AM Please see Amion for pager number during day hours 7:00am-4:30pm

## 2022-04-01 DIAGNOSIS — N179 Acute kidney failure, unspecified: Secondary | ICD-10-CM | POA: Diagnosis not present

## 2022-04-01 DIAGNOSIS — C641 Malignant neoplasm of right kidney, except renal pelvis: Secondary | ICD-10-CM | POA: Diagnosis not present

## 2022-04-01 DIAGNOSIS — E1165 Type 2 diabetes mellitus with hyperglycemia: Secondary | ICD-10-CM | POA: Diagnosis not present

## 2022-04-01 DIAGNOSIS — N400 Enlarged prostate without lower urinary tract symptoms: Secondary | ICD-10-CM | POA: Diagnosis not present

## 2022-04-01 LAB — COMPREHENSIVE METABOLIC PANEL
ALT: 9 U/L (ref 0–44)
AST: 10 U/L — ABNORMAL LOW (ref 15–41)
Albumin: 2.5 g/dL — ABNORMAL LOW (ref 3.5–5.0)
Alkaline Phosphatase: 54 U/L (ref 38–126)
Anion gap: 5 (ref 5–15)
BUN: 14 mg/dL (ref 8–23)
CO2: 25 mmol/L (ref 22–32)
Calcium: 8.4 mg/dL — ABNORMAL LOW (ref 8.9–10.3)
Chloride: 107 mmol/L (ref 98–111)
Creatinine, Ser: 1.14 mg/dL (ref 0.61–1.24)
GFR, Estimated: >60 mL/min (ref >60)
Glucose, Bld: 165 mg/dL — ABNORMAL HIGH (ref 70–99)
Potassium: 4.1 mmol/L (ref 3.5–5.1)
Sodium: 137 mmol/L (ref 135–145)
Total Bilirubin: 0.7 mg/dL (ref 0.3–1.2)
Total Protein: 5.8 g/dL — ABNORMAL LOW (ref 6.5–8.1)

## 2022-04-01 LAB — GLUCOSE, CAPILLARY
Glucose-Capillary: 209 mg/dL — ABNORMAL HIGH (ref 70–99)
Glucose-Capillary: 185 mg/dL — ABNORMAL HIGH (ref 70–99)
Glucose-Capillary: 280 mg/dL — ABNORMAL HIGH (ref 70–99)
Glucose-Capillary: 230 mg/dL — ABNORMAL HIGH (ref 70–99)

## 2022-04-01 LAB — CBC WITH DIFFERENTIAL/PLATELET
Abs Immature Granulocytes: 0.03 10*3/uL (ref 0.00–0.07)
Basophils Absolute: 0 10*3/uL (ref 0.0–0.1)
Basophils Relative: 0 %
Eosinophils Absolute: 0.1 10*3/uL (ref 0.0–0.5)
Eosinophils Relative: 2 %
HCT: 29.7 % — ABNORMAL LOW (ref 39.0–52.0)
Hemoglobin: 9.8 g/dL — ABNORMAL LOW (ref 13.0–17.0)
Immature Granulocytes: 1 %
Lymphocytes Relative: 16 %
Lymphs Abs: 1 10*3/uL (ref 0.7–4.0)
MCH: 29.4 pg (ref 26.0–34.0)
MCHC: 33 g/dL (ref 30.0–36.0)
MCV: 89.2 fL (ref 80.0–100.0)
Monocytes Absolute: 0.5 10*3/uL (ref 0.1–1.0)
Monocytes Relative: 8 %
Neutro Abs: 4.5 10*3/uL (ref 1.7–7.7)
Neutrophils Relative %: 73 %
Platelets: 164 10*3/uL (ref 150–400)
RBC: 3.33 MIL/uL — ABNORMAL LOW (ref 4.22–5.81)
RDW: 13.2 % (ref 11.5–15.5)
WBC: 6.1 10*3/uL (ref 4.0–10.5)
nRBC: 0 % (ref 0.0–0.2)

## 2022-04-01 LAB — MAGNESIUM: Magnesium: 1.8 mg/dL (ref 1.7–2.4)

## 2022-04-01 MED ORDER — POLYETHYLENE GLYCOL 3350 17 G PO PACK
17.0000 g | PACK | Freq: Every day | ORAL | Status: DC
  Administered 2022-04-01 – 2022-04-04 (×4): 17 g via ORAL
  Filled 2022-04-01 (×4): qty 1

## 2022-04-01 MED ORDER — GABAPENTIN 400 MG PO CAPS
400.0000 mg | ORAL_CAPSULE | Freq: Three times a day (TID) | ORAL | Status: DC
  Administered 2022-04-01 – 2022-04-04 (×9): 400 mg via ORAL
  Filled 2022-04-01 (×9): qty 1

## 2022-04-01 MED ORDER — PANTOPRAZOLE SODIUM 40 MG PO TBEC
40.0000 mg | DELAYED_RELEASE_TABLET | Freq: Two times a day (BID) | ORAL | Status: DC
  Administered 2022-04-01 – 2022-04-04 (×6): 40 mg via ORAL
  Filled 2022-04-01 (×6): qty 1

## 2022-04-01 NOTE — Progress Notes (Signed)
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  Patient has had worsening flank/abdominal pain for the last 5 weeks and has had multiple ER visits and scans including CT of abdomen and pelvis/MRI of thoracolumbar spine.  On presentation, he was in severe pain requiring IV Dilaudid.  Work-up showed creatinine of 1.65; CTA chest was negative for PE; right upper quad ultrasound showed cholelithiasis without cholecystitis.  He was started on IV fluids.  General surgery was consulted.   Assessment & Plan:   Severe right flank/abdominal pain Cholelithiasis History of recent T12 and L4 compression fractures -Presented with worsening right flank/abdominal pain for 5 weeks and has had multiple ER visits and scans including CT of abdomen and pelvis/MRI of thoracolumbar spine.  He was initially prescribed prednisone as an outpatient with slight improvement in the pain but since then the pain has worsened. -Unclear cause of the pain: most likely musculoskeletal or neuropathic -Continue IV and oral opiates for now.  Continue oral Protonix along with muscle relaxants.  Increase gabapentin to 400 mg 3 times a day.  Pain improving but still significant.   -General surgery following: No plans for any further surgery at this time  Acute kidney injury -Possibly from dehydration.  Creatinine 1.65 on presentation.  Resolved.  Off IV fluids.  ARB/ACE on hold  Hyponatremia -Resolved.  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  Alen Blew.  Currently undergoing chemotherapy.  Oncology follow-up appreciated.  Prostatic hyperplasia -continue finasteride  Hypertension -Blood pressure currently stable.  Continue amlodipine.  Lisinopril on hold.  Diabetes mellitus type 2 with hyperglycemia -A1c 10.8.  Hold oral regimen.  Continue long-acting insulin.  Continue CBGs with SSI.  Hyperlipidemia -Continue statin  Depression -continue sertraline   DVT prophylaxis: Lovenox  code Status: Full Family Communication: None at bedside Disposition Plan: Status is: inpatient because: Of severity of illness.  Still requiring IV Dilaudid.  Possible discharge in 1 to 2 days if pain is better controlled.  Consultants: General surgery  Procedures: None  Antimicrobials: None   Subjective: Patient seen and examined at bedside.  Feels slightly better.  Slept good for the first time in 5 weeks.  Woke up at around 5 in the morning with right flank pain.  Does not feel ready to go home today yet.  No overnight fever, chest pain or vomiting reported.   Objective: Vitals:   03/31/22 1320 03/31/22 1928 04/01/22 0337 04/01/22 0445  BP: 125/81 (!) 141/76 124/62   Pulse: 80 78 73   Resp: (!) '22 16 16   '$ Temp: 98.1 F (36.7 C) 98.6 F (37 C) 98.8 F (37.1 C)   TempSrc:  Oral Oral   SpO2: 98% 96% 96%   Weight:    80.4 kg  Height:        Intake/Output Summary (Last 24 hours) at 04/01/2022 1001 Last data filed at 03/31/2022 1425 Gross per 24 hour  Intake 364 ml  Output --  Net 364 ml    Filed Weights   03/30/22 0407 03/31/22 0429 04/01/22 0445  Weight: 79.7 kg 81.6 kg 80.4 kg    Examination:  General: On room air.  No distress.  respiratory: Decreased breath sounds at bases bilaterally with some crackles CVS: Currently rate controlled; S1-S2 heard  abdominal: Soft, mildly tender in the right flank region, slightly distended, no organomegaly; normal bowel sounds are heard  extremities: Trace lower extremity edema; no  clubbing.      Data Reviewed: I have personally reviewed following labs and imaging studies  CBC: Recent Labs  Lab 03/25/22 1241 03/25/22 1415 03/28/22 1232 03/28/22 1248 03/29/22 0533 04/01/22 0445  WBC 9.7 9.3 9.5  --  9.6 6.1  NEUTROABS 8.7* 8.5* 7.5  --   --  4.5  HGB 12.3* 12.2* 12.4* 13.3 11.6* 9.8*  HCT 35.5* 34.8* 36.8* 39.0 35.4* 29.7*  MCV 84.9 84.1 87.4  --  89.8 89.2  PLT 276 266 245  --  169 174    Basic Metabolic Panel: Recent Labs  Lab 03/25/22 1415 03/28/22 1232 03/28/22 1248 03/29/22 0533 03/30/22 0437 04/01/22 0445  NA 129* 133* 134* 134* 136 137  K 4.9 4.1 4.1 4.9 4.4 4.1  CL 99 96* 98 101 106 107  CO2 19* 25  --  '23 24 25  '$ GLUCOSE 861* 448* 439* 282* 152* 165*  BUN 43* 32* 30* '20 13 14  '$ CREATININE 1.62* 1.65* 1.60* 1.18 0.95 1.14  CALCIUM 9.5 9.1  --  8.6* 8.3* 8.4*  MG  --   --   --   --  1.8 1.8    GFR: Estimated Creatinine Clearance: 56.9 mL/min (by C-G formula based on SCr of 1.14 mg/dL). Liver Function Tests: Recent Labs  Lab 03/25/22 1415 03/28/22 1232 03/29/22 0533 03/30/22 0437 04/01/22 0445  AST 14* 11* 11* 10* 10*  ALT '17 13 10 9 9  '$ ALKPHOS 101 83 74 64 54  BILITOT 0.5 0.7 0.9 0.7 0.7  PROT 7.1 7.1 6.3* 5.8* 5.8*  ALBUMIN 3.5 3.3* 3.1* 2.7* 2.5*    Recent Labs  Lab 03/25/22 1415  LIPASE 77*    No results for input(s): "AMMONIA" in the last 168 hours. Coagulation Profile: No results for input(s): "INR", "PROTIME" in the last 168 hours. Cardiac Enzymes: No results for input(s): "CKTOTAL", "CKMB", "CKMBINDEX", "TROPONINI" in the last 168 hours. BNP (last 3 results) No results for input(s): "PROBNP" in the last 8760 hours. HbA1C: No results for input(s): "HGBA1C" in the last 72 hours.  CBG: Recent Labs  Lab 03/31/22 1151 03/31/22 1627 03/31/22 1819 03/31/22 2104 04/01/22 0856  GLUCAP 318* 149* 149* 174* 209*    Lipid Profile: No results for input(s): "CHOL", "HDL", "LDLCALC", "TRIG", "CHOLHDL",  "LDLDIRECT" in the last 72 hours. Thyroid Function Tests: No results for input(s): "TSH", "T4TOTAL", "FREET4", "T3FREE", "THYROIDAB" in the last 72 hours. Anemia Panel: No results for input(s): "VITAMINB12", "FOLATE", "FERRITIN", "TIBC", "IRON", "RETICCTPCT" in the last 72 hours. Sepsis Labs: Recent Labs  Lab 03/28/22 1232 03/28/22 1406  LATICACIDVEN 1.9 1.3     Recent Results (from the past 240 hour(s))  Resp Panel by RT-PCR (Flu A&B, Covid) Anterior Nasal Swab     Status: None   Collection Time: 03/25/22  2:29 PM   Specimen: Anterior Nasal Swab  Result Value Ref Range Status   SARS Coronavirus 2 by RT PCR NEGATIVE NEGATIVE Final    Comment: (NOTE) SARS-CoV-2 target nucleic acids are NOT DETECTED.  The SARS-CoV-2 RNA is generally detectable in upper respiratory specimens during the acute phase of infection. The lowest concentration of SARS-CoV-2 viral copies this assay can detect is 138 copies/mL. A  negative result does not preclude SARS-Cov-2 infection and should not be used as the sole basis for treatment or other patient management decisions. A negative result may occur with  improper specimen collection/handling, submission of specimen other than nasopharyngeal swab, presence of viral mutation(s) within the areas targeted by this assay, and inadequate number of viral copies(<138 copies/mL). A negative result must be combined with clinical observations, patient history, and epidemiological information. The expected result is Negative.  Fact Sheet for Patients:  EntrepreneurPulse.com.au  Fact Sheet for Healthcare Providers:  IncredibleEmployment.be  This test is no t yet approved or cleared by the [place] FDA and  has been authorized for detection and/or diagnosis of SARS-CoV-2 by FDA under an Emergency Use Authorization (EUA). This EUA will remain  in effect (meaning this test can be used) for the duration of the COVID-19  declaration under Section 564(b)(1) of the Act, 21 U.S.C.section 360bbb-3(b)(1), unless the authorization is terminated  or revoked sooner.       Influenza A by PCR NEGATIVE NEGATIVE Final   Influenza B by PCR NEGATIVE NEGATIVE Final    Comment: (NOTE) The Xpert Xpress SARS-CoV-2/FLU/RSV plus assay is intended as an aid in the diagnosis of influenza from Nasopharyngeal swab specimens and should not be used as a sole basis for treatment. Nasal washings and aspirates are unacceptable for Xpert Xpress SARS-CoV-2/FLU/RSV testing.  Fact Sheet for Patients: EntrepreneurPulse.com.au  Fact Sheet for Healthcare Providers: IncredibleEmployment.be  This test is not yet approved or cleared by the [place] FDA and has been authorized for detection and/or diagnosis of SARS-CoV-2 by FDA under an Emergency Use Authorization (EUA). This EUA will remain in effect (meaning this test can be used) for the duration of the COVID-19 declaration under Section 564(b)(1) of the Act, 21 U.S.C. section 360bbb-3(b)(1), unless the authorization is terminated or revoked.  Performed at [facility], [place] [address], [place], [place] [address]          Radiology Studies: No results found.      Scheduled Meds:  amLODipine  10 mg Oral QHS   aspirin EC  81 mg Oral q morning   calcitonin (salmon)  1 spray Alternating Nares Daily   Chlorhexidine Gluconate Cloth  6 each Topical Q0600   enoxaparin (LOVENOX) injection  40 mg Subcutaneous QHS   feeding supplement (NEPRO CARB STEADY)  237 mL Oral TID BM   finasteride  5 mg Oral Daily   gabapentin  300 mg Oral TID   insulin aspart  0-15 Units Subcutaneous TID WC   insulin glargine-yfgn  15 Units Subcutaneous Daily   latanoprost  1 drop Left Eye QHS   lidocaine  1 patch Transdermal Q24H   multivitamin with minerals  1 tablet Oral Daily   pantoprazole (PROTONIX) IV  40 mg Intravenous Q12H    senna-docusate  1 tablet Oral BID   sertraline  50 mg Oral Q1200   simvastatin  20 mg Oral QHS   sodium chloride flush  10-40 mL Intracatheter Q12H   Continuous Infusions:         Aline August, MD [facility] 04/01/2022, 10:01 AM

## 2022-04-01 NOTE — Progress Notes (Signed)
Progress Note     Subjective: Pt reports he is still having pain but it is maybe slightly improved from yesterday. He is concerned about what his pain will be managed with upon discharge. He is also concerned about whether he will need insulin upon discharge. His pain is not specifically brought on by eating although he does endorse some constipation today. No nausea or vomiting.   Objective: Vital signs in last 24 hours: Temp:  [98.6 F (37 C)-98.8 F (37.1 C)] 98.8 F (37.1 C) (07/18 0337) Pulse Rate:  [73-78] 73 (07/18 0337) Resp:  [16] 16 (07/18 0337) BP: (124-141)/(62-76) 124/62 (07/18 0337) SpO2:  [96 %] 96 % (07/18 0337) Weight:  [80.4 kg] 80.4 kg (07/18 0445) Last BM Date : 03/28/22  Intake/Output from previous day: 07/17 0701 - 07/18 0700 In: 602 [P.O.:592; I.V.:10] Out: -  Intake/Output this shift: No intake/output data recorded.  PE: General: pleasant, WD, WN malewho is laying in bed in NAD HEENT: sclera anicteric  Lungs:  Respiratory effort nonlabored Abd: soft, NT, negative murphy sign, ND, +BS MS: all 4 extremities are symmetrical with no cyanosis, clubbing, or edema. Psych: A&Ox3 with an appropriate affect.   Lab Results:  Recent Labs    04/01/22 0445  WBC 6.1  HGB 9.8*  HCT 29.7*  PLT 164   BMET Recent Labs    03/30/22 0437 04/01/22 0445  NA 136 137  K 4.4 4.1  CL 106 107  CO2 24 25  GLUCOSE 152* 165*  BUN 13 14  CREATININE 0.95 1.14  CALCIUM 8.3* 8.4*   PT/INR No results for input(s): "LABPROT", "INR" in the last 72 hours. CMP     Component Value Date/Time   NA 137 04/01/2022 0445   K 4.1 04/01/2022 0445   CL 107 04/01/2022 0445   CO2 25 04/01/2022 0445   GLUCOSE 165 (H) 04/01/2022 0445   BUN 14 04/01/2022 0445   CREATININE 1.14 04/01/2022 0445   CREATININE 1.80 (H) 03/25/2022 1241   CALCIUM 8.4 (L) 04/01/2022 0445   PROT 5.8 (L) 04/01/2022 0445   ALBUMIN 2.5 (L) 04/01/2022 0445   AST 10 (L) 04/01/2022 0445   AST 9 (L)  03/25/2022 1241   ALT 9 04/01/2022 0445   ALT 13 03/25/2022 1241   ALKPHOS 54 04/01/2022 0445   BILITOT 0.7 04/01/2022 0445   BILITOT 0.4 03/25/2022 1241   GFRNONAA >60 04/01/2022 0445   GFRNONAA 39 (L) 03/25/2022 1241   GFRAA >60 04/11/2020 0947   Lipase     Component Value Date/Time   LIPASE 77 (H) 03/25/2022 1415       Studies/Results: No results found.  Anti-infectives: Anti-infectives (From admission, onward)    None        Assessment/Plan Right flank pain of uncertain etiology Cholelithiasis - pt does report some exacerbation of pain with eating but patient is not reproducible on abdominal exam today  - no signs of cholecystitis on imaging - pain not suggestive of biliary pathology, no acute surgical intervention recommended - General surgery will sign off at this time but I will schedule outpatient follow up with Dr. Ninfa Linden in ~3 weeks to re-examine   FEN: HH diet, SLIV VTE: LMWH ID: no current abx   - below per TRH -  Stage IV urothelial cancer  T2DM with Hyperglycemia s/p steroids Compression fractures of T12/L4 HTN HLD   LOS: 3 days   I reviewed hospitalist notes, last 24 h vitals and pain scores, last 48 h  intake and output, and last 24 h labs and trends.     Norm Parcel, [facility] Surgery 04/01/2022, 2:03 PM Please see Amion for pager number during day hours 7:00am-4:30pm

## 2022-04-01 NOTE — Evaluation (Signed)
Physical Therapy Evaluation Patient Details Name: Hector Lopez MRN: [id number] DOB: May 22, 1946 Today's Date: 04/01/2022  History of Present Illness  Patient is 76 y.o. male who presented with worsening right flank/abdominal pain along with hyperglycemia. 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   Clinical Impression  Hector Lopez is 76 y.o. male admitted with above HPI and diagnosis. Patient is currently limited by functional impairments below (see PT problem list). Patient lives with spouse and is independent at baseline. Patient evaluated by Physical Therapy with no further acute PT needs identified. All education has been completed and the patient has no further questions. He is mobilizing at supervision level and is safe to mobilize with RN/NT staff. See below for any follow-up Physical Therapy or equipment needs. PT is signing off. Thank you for this referral.     Recommendations for follow up therapy are one component of a multi-disciplinary discharge planning process, led by the attending physician.  Recommendations may be updated based on patient status, additional functional criteria and insurance authorization.  Follow Up Recommendations No PT follow up      Assistance Recommended at Discharge Set up Supervision/Assistance  Patient can return home with the following       Equipment Recommendations None recommended by PT  Recommendations for Other Services       Functional Status Assessment Patient has had a recent decline in their functional status and demonstrates the ability to make significant improvements in function in a reasonable and predictable amount of time.     Precautions / Restrictions Precautions Precautions: Fall Restrictions Weight Bearing Restrictions: No      Mobility   Bed Mobility Overal bed mobility: Modified Independent             General bed mobility comments: extra time and cues for bed rail    Transfers Overall transfer level: Needs assistance Equipment used: None Transfers: Sit to/from Stand Sit to Stand: Supervision           General transfer comment: extra time, supervision for safety, pt had slight dizziness that resolved in <10 seconds    Ambulation/Gait Ambulation/Gait assistance: Supervision Gait Distance (Feet): 240 Feet Assistive device: None Gait Pattern/deviations: Step-through pattern, Decreased stride length, Drifts right/left, Decreased dorsiflexion - left, Decreased dorsiflexion - right Gait velocity: decr     General Gait Details: cues needed to increase dorsiflexion, pt tends to shuffle/slide feet. no overt LOB noted. slight drift during gait.  Stairs            Wheelchair Mobility    Modified Rankin (Stroke Patients Only)       Balance Overall balance assessment: Mild deficits observed, not formally tested                                           Pertinent Vitals/Pain Pain Assessment Pain Assessment: Faces Faces Pain Scale: Hurts little more Pain Location: bil sides Pain Descriptors / Indicators: Aching, Discomfort Pain Intervention(s): Monitored during session, Limited activity within patient's tolerance    Home Living Family/patient expects to be discharged to:: Private residence Living Arrangements: Spouse/significant other Available Help at Discharge: Family Type of Home: House Home Access: Stairs to enter Entrance Stairs-Rails: Chemical engineer of Steps: 2   Home Layout: Two level;Able to  live on main level with bedroom/bathroom Home Equipment: None Additional Comments: 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, has 4 dogs and a Research scientist (life sciences) Prior Level of Function : Independent/Modified Independent             Mobility Comments: Ind  with no AD ADLs Comments: Ind     Hand Dominance   Dominant Hand: Right    Extremity/Trunk Assessment   Upper Extremity Assessment Upper Extremity Assessment: Overall WFL for tasks assessed    Lower Extremity Assessment Lower Extremity Assessment: Overall WFL for tasks assessed    Cervical / Trunk Assessment Cervical / Trunk Assessment: Normal  Communication   Communication: No difficulties  Cognition Arousal/Alertness: Awake/alert Behavior During Therapy: WFL for tasks assessed/performed Overall Cognitive Status: Within Functional Limits for tasks assessed                                          General Comments      Exercises     Assessment/Plan    PT Assessment Patient does not need any further PT services  PT Problem List         PT Treatment Interventions      PT Goals (Current goals can be found in the Care Plan section)  Acute Rehab PT Goals Patient Stated Goal: stop hurting in abdomen PT Goal Formulation: With patient Time For Goal Achievement: 04/08/22 Potential to Achieve Goals: Good    Frequency       Co-evaluation               AM-PAC PT "6 Clicks" Mobility  Outcome Measure Help needed turning from your back to your side while in a flat bed without using bedrails?: None Help needed moving from lying on your back to sitting on the side of a flat bed without using bedrails?: None Help needed moving to and from a bed to a chair (including a wheelchair)?: None Help needed standing up from a chair using your arms (e.g., wheelchair or bedside chair)?: None Help needed to walk in hospital room?: A Little Help needed climbing 3-5 steps with a railing? : A Little 6 Click Score: 22    End of Session Equipment Utilized During Treatment: Gait belt Activity Tolerance: Patient tolerated treatment well Patient left: in bed;with call bell/phone within reach Nurse Communication: Mobility status PT Visit Diagnosis: Difficulty in  walking, not elsewhere classified (R26.2);Unsteadiness on feet (R26.81);Other abnormalities of gait and mobility (R26.89)    Time: 7371-0626 PT Time Calculation (min) (ACUTE ONLY): 24 min   Charges:   PT Evaluation $PT Eval Low Complexity: 1 Low PT Treatments $Gait Training: 8-22 mins        Verner Mould, DPT Acute Rehabilitation Services Office [phone] Pager [phone]  04/01/22 3:30 PM

## 2022-04-02 DIAGNOSIS — R109 Unspecified abdominal pain: Secondary | ICD-10-CM | POA: Diagnosis not present

## 2022-04-02 DIAGNOSIS — R739 Hyperglycemia, unspecified: Secondary | ICD-10-CM

## 2022-04-02 DIAGNOSIS — E1165 Type 2 diabetes mellitus with hyperglycemia: Secondary | ICD-10-CM | POA: Diagnosis not present

## 2022-04-02 DIAGNOSIS — N179 Acute kidney failure, unspecified: Secondary | ICD-10-CM | POA: Diagnosis not present

## 2022-04-02 LAB — GLUCOSE, CAPILLARY
Glucose-Capillary: 207 mg/dL — ABNORMAL HIGH (ref 70–99)
Glucose-Capillary: 238 mg/dL — ABNORMAL HIGH (ref 70–99)
Glucose-Capillary: 145 mg/dL — ABNORMAL HIGH (ref 70–99)
Glucose-Capillary: 264 mg/dL — ABNORMAL HIGH (ref 70–99)

## 2022-04-02 LAB — BASIC METABOLIC PANEL
Anion gap: 6 (ref 5–15)
BUN: 15 mg/dL (ref 8–23)
CO2: 27 mmol/L (ref 22–32)
Calcium: 8.5 mg/dL — ABNORMAL LOW (ref 8.9–10.3)
Chloride: 106 mmol/L (ref 98–111)
Creatinine, Ser: 1.01 mg/dL (ref 0.61–1.24)
GFR, Estimated: >60 mL/min (ref >60)
Glucose, Bld: 182 mg/dL — ABNORMAL HIGH (ref 70–99)
Potassium: 3.9 mmol/L (ref 3.5–5.1)
Sodium: 139 mmol/L (ref 135–145)

## 2022-04-02 LAB — MAGNESIUM: Magnesium: 2 mg/dL (ref 1.7–2.4)

## 2022-04-02 NOTE — Progress Notes (Signed)
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  Patient has had worsening flank/abdominal pain for the last 5 weeks and has had multiple ER visits and scans including CT of abdomen and pelvis/MRI of thoracolumbar spine.  On presentation, he was in severe pain requiring IV Dilaudid.  Work-up showed creatinine of 1.65; CTA chest was negative for PE; right upper quad ultrasound showed cholelithiasis without cholecystitis.  He was started on IV fluids.  General surgery was consulted.   Assessment & Plan:   Severe right flank/abdominal pain Cholelithiasis History of recent T12 and L4 compression fractures -Presented with worsening right flank/abdominal pain for 5 weeks and has had multiple ER visits and scans including CT of abdomen and pelvis/MRI of thoracolumbar spine.  He was initially prescribed prednisone as an outpatient with slight improvement in the pain but since then the pain has worsened. -Unclear cause of the pain: most likely musculoskeletal or neuropathic -Gabapentin dose increased on 7/18 -He is still required several doses of IV Dilaudid -Encourage patient to rely more on oral pain medicine in preparation for discharge -Once his need for IV narcotics has improved, can consider discharge home -General surgery following: Plans for outpatient follow-up with consideration for cholecystectomy as an outpatient if indicated  Acute kidney injury -Possibly from dehydration.  Creatinine 1.65 on presentation.  Resolved.  Off IV fluids.  ARB/ACE on hold  Hyponatremia -Resolved.  Stage IV high-grade  urothelial carcinoma of the right ureter status post right nephroureterectomy with pelvic adenopathy -Follows up with Dr. Alen Blew.  Currently undergoing chemotherapy.  Oncology follow-up appreciated.  Prostatic hyperplasia -continue finasteride  Hypertension -Blood pressure currently stable.  Continue amlodipine.  Lisinopril on hold.  Diabetes mellitus type 2 with hyperglycemia -A1c 10.8.  Hold oral regimen.  Continue long-acting insulin.  Continue CBGs with SSI.  Hyperlipidemia -Continue statin  Depression -continue sertraline   DVT prophylaxis: Lovenox  code Status: Full Family Communication: None at bedside Disposition Plan: Status is: inpatient because: Of severity of illness.  Still requiring IV Dilaudid.  Possible discharge in 1 to 2 days if pain is better controlled.  Consultants: General surgery  Procedures: None  Antimicrobials: None   Subjective: Reports continued pain in the right side, mostly between right flank and right upper quadrant.  Sometimes radiates to left side/flank  Objective: Vitals:   04/01/22 1925 04/02/22 0319 04/02/22 0453 04/02/22 1946  BP: (!) 131/58 (!) 144/78  (!) 121/55  Pulse: 70 79  76  Resp: '16 18  16  '$ Temp: 98.7 F (37.1 C) 99.2 F (37.3 C)  99.2 F (37.3 C)  TempSrc: Oral Oral  Oral  SpO2: 97% 94%  95%  Weight:   78.5 kg   Height:        Intake/Output Summary (Last 24 hours) at 04/02/2022 2016 Last data filed at 04/02/2022 1700 Gross per 24 hour  Intake 720 ml  Output --  Net 720 ml   Filed Weights   03/31/22 0429 04/01/22 0445 04/02/22 0453  Weight: 81.6 kg 80.4 kg 78.5 kg    Examination:  General: On room air.  No distress.  respiratory: Decreased breath sounds at bases bilaterally with some crackles CVS: Currently rate  controlled; S1-S2 heard  abdominal: Soft, mildly tender in the right flank region, slightly distended, no organomegaly; normal bowel sounds are heard  extremities: Trace lower extremity edema; no  clubbing.      Data Reviewed: I have personally reviewed following labs and imaging studies  CBC: Recent Labs  Lab 03/28/22 1232 03/28/22 1248 03/29/22 0533 04/01/22 0445  WBC 9.5  --  9.6 6.1  NEUTROABS 7.5  --   --  4.5  HGB 12.4* 13.3 11.6* 9.8*  HCT 36.8* 39.0 35.4* 29.7*  MCV 87.4  --  89.8 89.2  PLT 245  --  169 798   Basic Metabolic Panel: Recent Labs  Lab 03/28/22 1232 03/28/22 1248 03/29/22 0533 03/30/22 0437 04/01/22 0445 04/02/22 0415  NA 133* 134* 134* 136 137 139  K 4.1 4.1 4.9 4.4 4.1 3.9  CL 96* 98 101 106 107 106  CO2 25  --  '23 24 25 27  '$ GLUCOSE 448* 439* 282* 152* 165* 182*  BUN 32* 30* '20 13 14 15  '$ CREATININE 1.65* 1.60* 1.18 0.95 1.14 1.01  CALCIUM 9.1  --  8.6* 8.3* 8.4* 8.5*  MG  --   --   --  1.8 1.8 2.0   GFR: Estimated Creatinine Clearance: 64.2 mL/min (by C-G formula based on SCr of 1.01 mg/dL). Liver Function Tests: Recent Labs  Lab 03/28/22 1232 03/29/22 0533 03/30/22 0437 04/01/22 0445  AST 11* 11* 10* 10*  ALT '13 10 9 9  '$ ALKPHOS 83 74 64 54  BILITOT 0.7 0.9 0.7 0.7  PROT 7.1 6.3* 5.8* 5.8*  ALBUMIN 3.3* 3.1* 2.7* 2.5*   No results for input(s): "LIPASE", "AMYLASE" in the last 168 hours. No results for input(s): "AMMONIA" in the last 168 hours. Coagulation Profile: No results for input(s): "INR", "PROTIME" in the last 168 hours. Cardiac Enzymes: No results for input(s): "CKTOTAL", "CKMB", "CKMBINDEX", "TROPONINI" in the last 168 hours. BNP (last 3 results) No results for input(s): "PROBNP" in the last 8760 hours. HbA1C: No results for input(s): "HGBA1C" in the last 72 hours.  CBG: Recent Labs  Lab 04/01/22 1631 04/01/22 2114 04/02/22 0733 04/02/22 1201 04/02/22 1657  GLUCAP 280* 230* 207* 238* 145*   Lipid Profile: No results for input(s): "CHOL", "HDL", "LDLCALC", "TRIG", "CHOLHDL", "LDLDIRECT" in the last 72 hours. Thyroid Function Tests: No results for input(s): "TSH", "T4TOTAL", "FREET4", "T3FREE",  "THYROIDAB" in the last 72 hours. Anemia Panel: No results for input(s): "VITAMINB12", "FOLATE", "FERRITIN", "TIBC", "IRON", "RETICCTPCT" in the last 72 hours. Sepsis Labs: Recent Labs  Lab 03/28/22 1232 03/28/22 1406  LATICACIDVEN 1.9 1.3    Recent Results (from the past 240 hour(s))  Resp Panel by RT-PCR (Flu A&B, Covid) Anterior Nasal Swab     Status: None   Collection Time: 03/25/22  2:29 PM   Specimen: Anterior Nasal Swab  Result Value Ref Range Status   SARS Coronavirus 2 by RT PCR NEGATIVE NEGATIVE Final    Comment: (NOTE) SARS-CoV-2 target nucleic acids are NOT DETECTED.  The SARS-CoV-2 RNA is generally detectable in upper respiratory specimens during the acute phase of infection. The lowest concentration of SARS-CoV-2 viral copies this assay can detect is 138 copies/mL. A negative result does not preclude SARS-Cov-2 infection and should not be used as the sole basis for treatment or other patient management decisions. A negative result may occur with  improper specimen collection/handling, submission of specimen other than nasopharyngeal swab, presence of viral mutation(s) within the areas targeted by this assay, and inadequate  number of viral copies(<138 copies/mL). A negative result must be combined with clinical observations, patient history, and epidemiological information. The expected result is Negative.  Fact Sheet for Patients:  EntrepreneurPulse.com.au  Fact Sheet for Healthcare Providers:  IncredibleEmployment.be  This test is no t yet approved or cleared by the [place] FDA and  has been authorized for detection and/or diagnosis of SARS-CoV-2 by FDA under an Emergency Use Authorization (EUA). This EUA will remain  in effect (meaning this test can be used) for the duration of the COVID-19 declaration under Section 564(b)(1) of the Act, 21 U.S.C.section 360bbb-3(b)(1), unless the authorization is terminated  or  revoked sooner.       Influenza A by PCR NEGATIVE NEGATIVE Final   Influenza B by PCR NEGATIVE NEGATIVE Final    Comment: (NOTE) The Xpert Xpress SARS-CoV-2/FLU/RSV plus assay is intended as an aid in the diagnosis of influenza from Nasopharyngeal swab specimens and should not be used as a sole basis for treatment. Nasal washings and aspirates are unacceptable for Xpert Xpress SARS-CoV-2/FLU/RSV testing.  Fact Sheet for Patients: EntrepreneurPulse.com.au  Fact Sheet for Healthcare Providers: IncredibleEmployment.be  This test is not yet approved or cleared by the [place] FDA and has been authorized for detection and/or diagnosis of SARS-CoV-2 by FDA under an Emergency Use Authorization (EUA). This EUA will remain in effect (meaning this test can be used) for the duration of the COVID-19 declaration under Section 564(b)(1) of the Act, 21 U.S.C. section 360bbb-3(b)(1), unless the authorization is terminated or revoked.  Performed at [facility], [place] [address] [place], [place] [address]          Radiology Studies: No results found.      Scheduled Meds:  amLODipine  10 mg Oral QHS   aspirin EC  81 mg Oral q morning   calcitonin (salmon)  1 spray Alternating Nares Daily   Chlorhexidine Gluconate Cloth  6 each Topical Q0600   enoxaparin (LOVENOX) injection  40 mg Subcutaneous QHS   feeding supplement (NEPRO CARB STEADY)  237 mL Oral TID BM   finasteride  5 mg Oral Daily   gabapentin  400 mg Oral TID   insulin aspart  0-15 Units Subcutaneous TID WC   insulin glargine-yfgn  15 Units Subcutaneous Daily   latanoprost  1 drop Left Eye QHS   lidocaine  1 patch Transdermal Q24H   multivitamin with minerals  1 tablet Oral Daily   pantoprazole  40 mg Oral BID   polyethylene glycol  17 g Oral Daily   senna-docusate  1 tablet Oral BID   sertraline  50 mg Oral Q1200   simvastatin  20 mg Oral QHS   sodium  chloride flush  10-40 mL Intracatheter Q12H   Continuous Infusions:         Kathie Dike, MD [facility] 04/02/2022, 8:16 PM

## 2022-04-02 NOTE — Progress Notes (Signed)
   Subjective/Chief Complaint: Pt reports that he went all night without pain until about 5 am.  His pain was then different which he described as "hot and wet'" going down into his right groin   Objective: Vital signs in last 24 hours: Temp:  [98.4 F (36.9 C)-99.2 F (37.3 C)] 99.2 F (37.3 C) (07/19 0319) Pulse Rate:  [67-79] 79 (07/19 0319) Resp:  [16-18] 18 (07/19 0319) BP: (131-144)/(58-82) 144/78 (07/19 0319) SpO2:  [94 %-98 %] 94 % (07/19 0319) Weight:  [78.5 kg] 78.5 kg (07/19 0453) Last BM Date : 03/28/22  Intake/Output from previous day: 07/18 0701 - 07/19 0700 In: 1416 [P.O.:1416] Out: -  Intake/Output this shift: No intake/output data recorded.  Exam: Awake and alert Looks comfortable Abdomen soft, non-tender on exam  Lab Results:  Recent Labs    04/01/22 0445  WBC 6.1  HGB 9.8*  HCT 29.7*  PLT 164   BMET Recent Labs    04/01/22 0445 04/02/22 0415  NA 137 139  K 4.1 3.9  CL 107 106  CO2 25 27  GLUCOSE 165* 182*  BUN 14 15  CREATININE 1.14 1.01  CALCIUM 8.4* 8.5*   PT/INR No results for input(s): "LABPROT", "INR" in the last 72 hours. ABG No results for input(s): "PHART", "HCO3" in the last 72 hours.  Invalid input(s): "PCO2", "PO2"  Studies/Results: No results found.  Anti-infectives: Anti-infectives (From admission, onward)    None       Assessment/Plan: Right flank/back pain of uncertain etiology Gallstones Stage IV urothelial carcinoma   Again, I don't think this is from the gallbladder or his hernia. I will still plan on following him as an outpt.  I am going to have him record a food diary and see if there is any correlation with the pain. I still may have to take out the gallbladder just to rule it out as a cause.   The concern is if the surgery could delay any of his cancer treatment as well as the small risk of complications with surgery  Moderately complex medical decision making   Coralie Keens  MD 04/02/2022

## 2022-04-03 DIAGNOSIS — E1165 Type 2 diabetes mellitus with hyperglycemia: Secondary | ICD-10-CM | POA: Diagnosis not present

## 2022-04-03 DIAGNOSIS — N179 Acute kidney failure, unspecified: Secondary | ICD-10-CM | POA: Diagnosis not present

## 2022-04-03 DIAGNOSIS — R739 Hyperglycemia, unspecified: Secondary | ICD-10-CM | POA: Diagnosis not present

## 2022-04-03 DIAGNOSIS — R109 Unspecified abdominal pain: Secondary | ICD-10-CM | POA: Diagnosis not present

## 2022-04-03 LAB — GLUCOSE, CAPILLARY
Glucose-Capillary: 212 mg/dL — ABNORMAL HIGH (ref 70–99)
Glucose-Capillary: 172 mg/dL — ABNORMAL HIGH (ref 70–99)

## 2022-04-03 MED ORDER — MAGNESIUM HYDROXIDE 400 MG/5ML PO SUSP
960.0000 mL | Freq: Once | ORAL | Status: AC
  Administered 2022-04-03: 960 mL via RECTAL
  Filled 2022-04-03: qty 473

## 2022-04-03 MED ORDER — FLEET ENEMA 7-19 GM/118ML RE ENEM
1.0000 | ENEMA | Freq: Once | RECTAL | Status: AC
  Administered 2022-04-03: 1 via RECTAL
  Filled 2022-04-03: qty 1

## 2022-04-03 MED ORDER — LINACLOTIDE 145 MCG PO CAPS
145.0000 ug | ORAL_CAPSULE | Freq: Every day | ORAL | Status: DC
  Administered 2022-04-04: 145 ug via ORAL
  Filled 2022-04-03 (×2): qty 1

## 2022-04-03 NOTE — Progress Notes (Signed)
Patient ID: Hector Lopez, male   DOB: 28-Nov-1945, 76 y.o.   MRN: [id number]   Pt had to receive enemas for constipation Feels weak currently but pain seems more controlled Abdomen remains benign  Nothing further to offer from surgery I will see again as an outpatient

## 2022-04-03 NOTE — Care Management Important Message (Signed)
Important Message  Patient Details IM Letter placed in Patients room. Name: Hector Lopez MRN: [id number] Date of Birth: [birth year]   Medicare Important Message Given:  Yes     Kerin Salen 04/03/2022, 11:00 AM

## 2022-04-03 NOTE — Progress Notes (Signed)
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  Patient has had worsening flank/abdominal pain for the last 5 weeks and has had multiple ER visits and scans including CT of abdomen and pelvis/MRI of thoracolumbar spine.  On presentation, he was in severe pain requiring IV Dilaudid.  Work-up showed creatinine of 1.65; CTA chest was negative for PE; right upper quad ultrasound showed cholelithiasis without cholecystitis.  He was started on IV fluids.  General surgery was consulted.   Assessment & Plan:   Severe right flank/abdominal pain Cholelithiasis History of recent T12 and L4 compression fractures -Presented with worsening right flank/abdominal pain for 5 weeks and has had multiple ER visits and scans including CT of abdomen and pelvis/MRI of thoracolumbar spine.  He was initially prescribed prednisone as an outpatient with slight improvement in the pain but since then the pain has worsened. -Unclear cause of the pain: most likely musculoskeletal or neuropathic -Gabapentin dose increased on 7/18 -We will discontinue IV Dilaudid in the hopes of getting him ready for discharge -Encourage patient to rely more on oral pain medicine in preparation for discharge -General surgery following: Plans for outpatient follow-up with consideration for cholecystectomy as an outpatient if indicated  Constipation -He has not had a bowel movement in several days -Has abdominal pain which is different than flank pain -He has been receiving enema with a small amount of stool output -We will also start on Linzess  Acute  kidney injury -Possibly from dehydration.  Creatinine 1.65 on presentation.  Resolved.  Off IV fluids.  ARB/ACE on hold  Hyponatremia -Resolved.  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 Alen Blew.  Currently undergoing chemotherapy.  Oncology follow-up appreciated.  Prostatic hyperplasia -continue finasteride  Hypertension -Blood pressure currently stable.  Continue amlodipine.  Lisinopril on hold.  Diabetes mellitus type 2 with hyperglycemia -A1c 10.8.  Hold oral regimen.  Continue long-acting insulin.  Continue CBGs with SSI.  Hyperlipidemia -Continue statin  Depression -continue sertraline   DVT prophylaxis: Lovenox  code Status: Full Family Communication: None at bedside Disposition Plan: Status is: inpatient because: Of severity of illness.  Still requiring IV Dilaudid.  Possible discharge in 1 to 2 days if pain is better controlled.  Consultants: General surgery  Procedures: None  Antimicrobials: None   Subjective: He woke up this morning with periumbilical abdominal pain.  He did receive some antibiotics today with some improvement with small amount of bowel movement.  Reports that his flank pain seems to be better today.  Objective: Vitals:   04/02/22 1946 04/03/22 0500 04/03/22 0532 04/03/22 1428  BP: (!) 121/55  140/68 136/81  Pulse: 76  74 74  Resp: '16  18 16  '$ Temp: 99.2 F (37.3 C)  99 F (37.2 C) 98.6 F (37 C)  TempSrc: Oral  Oral Oral  SpO2: 95%  92% 97%  Weight:  79 kg    Height:        Intake/Output Summary (Last 24 hours) at 04/03/2022 1948 Last data filed at 04/03/2022 1858 Gross per 24 hour  Intake 1063 ml  Output --  Net 1063 ml  Filed Weights   04/01/22 0445 04/02/22 0453 04/03/22 0500  Weight: 80.4 kg 78.5 kg 79 kg    Examination:  General: On room air.  No distress.  respiratory: Decreased breath sounds at bases bilaterally  with some crackles CVS: Currently rate controlled; S1-S2 heard  abdominal: Soft, mildly tender in the right flank region, slightly distended, no organomegaly; normal bowel sounds are heard  extremities: Trace lower extremity edema; no clubbing.      Data Reviewed: I have personally reviewed following labs and imaging studies  CBC: Recent Labs  Lab 03/28/22 1232 03/28/22 1248 03/29/22 0533 04/01/22 0445  WBC 9.5  --  9.6 6.1  NEUTROABS 7.5  --   --  4.5  HGB 12.4* 13.3 11.6* 9.8*  HCT 36.8* 39.0 35.4* 29.7*  MCV 87.4  --  89.8 89.2  PLT 245  --  169 650   Basic Metabolic Panel: Recent Labs  Lab 03/28/22 1232 03/28/22 1248 03/29/22 0533 03/30/22 0437 04/01/22 0445 04/02/22 0415  NA 133* 134* 134* 136 137 139  K 4.1 4.1 4.9 4.4 4.1 3.9  CL 96* 98 101 106 107 106  CO2 25  --  '23 24 25 27  '$ GLUCOSE 448* 439* 282* 152* 165* 182*  BUN 32* 30* '20 13 14 15  '$ CREATININE 1.65* 1.60* 1.18 0.95 1.14 1.01  CALCIUM 9.1  --  8.6* 8.3* 8.4* 8.5*  MG  --   --   --  1.8 1.8 2.0   GFR: Estimated Creatinine Clearance: 64.2 mL/min (by C-G formula based on SCr of 1.01 mg/dL). Liver Function Tests: Recent Labs  Lab 03/28/22 1232 03/29/22 0533 03/30/22 0437 04/01/22 0445  AST 11* 11* 10* 10*  ALT '13 10 9 9  '$ ALKPHOS 83 74 64 54  BILITOT 0.7 0.9 0.7 0.7  PROT 7.1 6.3* 5.8* 5.8*  ALBUMIN 3.3* 3.1* 2.7* 2.5*   No results for input(s): "LIPASE", "AMYLASE" in the last 168 hours. No results for input(s): "AMMONIA" in the last 168 hours. Coagulation Profile: No results for input(s): "INR", "PROTIME" in the last 168 hours. Cardiac Enzymes: No results for input(s): "CKTOTAL", "CKMB", "CKMBINDEX", "TROPONINI" in the last 168 hours. BNP (last 3 results) No results for input(s): "PROBNP" in the last 8760 hours. HbA1C: No results for input(s): "HGBA1C" in the last 72 hours.  CBG: Recent Labs  Lab 04/02/22 1201 04/02/22 1657 04/02/22 2132 04/03/22 0743 04/03/22 1152  GLUCAP 238*  145* 264* 212* 172*   Lipid Profile: No results for input(s): "CHOL", "HDL", "LDLCALC", "TRIG", "CHOLHDL", "LDLDIRECT" in the last 72 hours. Thyroid Function Tests: No results for input(s): "TSH", "T4TOTAL", "FREET4", "T3FREE", "THYROIDAB" in the last 72 hours. Anemia Panel: No results for input(s): "VITAMINB12", "FOLATE", "FERRITIN", "TIBC", "IRON", "RETICCTPCT" in the last 72 hours. Sepsis Labs: Recent Labs  Lab 03/28/22 1232 03/28/22 1406  LATICACIDVEN 1.9 1.3    Recent Results (from the past 240 hour(s))  Resp Panel by RT-PCR (Flu A&B, Covid) Anterior Nasal Swab     Status: None   Collection Time: 03/25/22  2:29 PM   Specimen: Anterior Nasal Swab  Result Value Ref Range Status   SARS Coronavirus 2 by RT PCR NEGATIVE NEGATIVE Final    Comment: (NOTE) SARS-CoV-2 target nucleic acids are NOT DETECTED.  The SARS-CoV-2 RNA is generally detectable in upper respiratory specimens during the acute phase of infection. The lowest concentration of SARS-CoV-2 viral copies this assay can detect is 138 copies/mL. A negative result does not preclude SARS-Cov-2 infection and should  not be used as the sole basis for treatment or other patient management decisions. A negative result may occur with  improper specimen collection/handling, submission of specimen other than nasopharyngeal swab, presence of viral mutation(s) within the areas targeted by this assay, and inadequate number of viral copies(<138 copies/mL). A negative result must be combined with clinical observations, patient history, and epidemiological information. The expected result is Negative.  Fact Sheet for Patients:  EntrepreneurPulse.com.au  Fact Sheet for Healthcare Providers:  IncredibleEmployment.be  This test is no t yet approved or cleared by the [place] FDA and  has been authorized for detection and/or diagnosis of SARS-CoV-2 by FDA under an Emergency Use Authorization  (EUA). This EUA will remain  in effect (meaning this test can be used) for the duration of the COVID-19 declaration under Section 564(b)(1) of the Act, 21 U.S.C.section 360bbb-3(b)(1), unless the authorization is terminated  or revoked sooner.       Influenza A by PCR NEGATIVE NEGATIVE Final   Influenza B by PCR NEGATIVE NEGATIVE Final    Comment: (NOTE) The Xpert Xpress SARS-CoV-2/FLU/RSV plus assay is intended as an aid in the diagnosis of influenza from Nasopharyngeal swab specimens and should not be used as a sole basis for treatment. Nasal washings and aspirates are unacceptable for Xpert Xpress SARS-CoV-2/FLU/RSV testing.  Fact Sheet for Patients: EntrepreneurPulse.com.au  Fact Sheet for Healthcare Providers: IncredibleEmployment.be  This test is not yet approved or cleared by the [place] FDA and has been authorized for detection and/or diagnosis of SARS-CoV-2 by FDA under an Emergency Use Authorization (EUA). This EUA will remain in effect (meaning this test can be used) for the duration of the COVID-19 declaration under Section 564(b)(1) of the Act, 21 U.S.C. section 360bbb-3(b)(1), unless the authorization is terminated or revoked.  Performed at [facility], [place] [address], [place], [place] [address]          Radiology Studies: No results found.      Scheduled Meds:  amLODipine  10 mg Oral QHS   aspirin EC  81 mg Oral q morning   calcitonin (salmon)  1 spray Alternating Nares Daily   Chlorhexidine Gluconate Cloth  6 each Topical Q0600   enoxaparin (LOVENOX) injection  40 mg Subcutaneous QHS   feeding supplement (NEPRO CARB STEADY)  237 mL Oral TID BM   finasteride  5 mg Oral Daily   gabapentin  400 mg Oral TID   insulin aspart  0-15 Units Subcutaneous TID WC   insulin glargine-yfgn  15 Units Subcutaneous Daily   latanoprost  1 drop Left Eye QHS   lidocaine  1 patch Transdermal Q24H    linaclotide  145 mcg Oral QAC breakfast   multivitamin with minerals  1 tablet Oral Daily   pantoprazole  40 mg Oral BID   polyethylene glycol  17 g Oral Daily   senna-docusate  1 tablet Oral BID   sertraline  50 mg Oral Q1200   simvastatin  20 mg Oral QHS   sodium chloride flush  10-40 mL Intracatheter Q12H   Continuous Infusions:         Kathie Dike, MD [facility] 04/03/2022, 7:48 PM

## 2022-04-04 DIAGNOSIS — I10 Essential (primary) hypertension: Secondary | ICD-10-CM | POA: Diagnosis not present

## 2022-04-04 DIAGNOSIS — N179 Acute kidney failure, unspecified: Secondary | ICD-10-CM | POA: Diagnosis not present

## 2022-04-04 DIAGNOSIS — E1165 Type 2 diabetes mellitus with hyperglycemia: Secondary | ICD-10-CM | POA: Diagnosis not present

## 2022-04-04 DIAGNOSIS — R739 Hyperglycemia, unspecified: Secondary | ICD-10-CM | POA: Diagnosis not present

## 2022-04-04 LAB — TSH: TSH: 3.16 u[IU]/mL (ref 0.350–4.500)

## 2022-04-04 LAB — GLUCOSE, CAPILLARY
Glucose-Capillary: 179 mg/dL — ABNORMAL HIGH (ref 70–99)
Glucose-Capillary: 215 mg/dL — ABNORMAL HIGH (ref 70–99)
Glucose-Capillary: 173 mg/dL — ABNORMAL HIGH (ref 70–99)
Glucose-Capillary: 173 mg/dL — ABNORMAL HIGH (ref 70–99)

## 2022-04-04 LAB — CREATININE, SERUM
Creatinine, Ser: 1.06 mg/dL (ref 0.61–1.24)
GFR, Estimated: >60 mL/min (ref >60)

## 2022-04-04 LAB — VITAMIN B12: Vitamin B-12: 496 pg/mL (ref 180–914)

## 2022-04-04 MED ORDER — HEPARIN SOD (PORK) LOCK FLUSH 100 UNIT/ML IV SOLN
500.0000 [IU] | INTRAVENOUS | Status: AC | PRN
  Administered 2022-04-04: 500 [IU]
  Filled 2022-04-04: qty 5

## 2022-04-04 MED ORDER — PANTOPRAZOLE SODIUM 40 MG PO TBEC: 40.0000 mg | DELAYED_RELEASE_TABLET | Freq: Two times a day (BID) | ORAL | 0 refills | Status: DC

## 2022-04-04 MED ORDER — LINACLOTIDE 145 MCG PO CAPS: 145.0000 ug | ORAL_CAPSULE | Freq: Every day | ORAL | 0 refills | Status: DC

## 2022-04-04 MED ORDER — GABAPENTIN 400 MG PO CAPS: 400.0000 mg | ORAL_CAPSULE | Freq: Three times a day (TID) | ORAL | 0 refills | Status: DC

## 2022-04-04 MED ORDER — OXYCODONE HCL 5 MG PO TABS: 5.0000 mg | ORAL_TABLET | Freq: Four times a day (QID) | ORAL | 0 refills | Status: DC | PRN

## 2022-04-04 MED ORDER — POLYETHYLENE GLYCOL 3350 17 G PO PACK: 17.0000 g | PACK | Freq: Every day | ORAL | 0 refills | Status: DC

## 2022-04-04 NOTE — Plan of Care (Signed)
  Problem: Health Behavior/Discharge Planning: Goal: Ability to manage health-related needs will improve Outcome: Progressing   

## 2022-04-04 NOTE — Plan of Care (Signed)

## 2022-04-04 NOTE — Discharge Summary (Signed)
Physician Discharge Summary  Hector Lopez QMV:[id number] DOB: [birth year] DOA: 03/28/2022  PCP: [name], MD  Admit date: 03/28/2022 Discharge date: 04/04/2022  Admitted From: home Disposition:  home  Recommendations for Outpatient Follow-up:  Follow up with PCP in 1-2 weeks Please obtain BMP/CBC in one week Follow up with Dr. 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  Patient has had worsening flank/abdominal pain for the last 5 weeks and has had multiple ER visits and scans including CT of abdomen and pelvis/MRI of thoracolumbar spine.  On presentation, he was in severe pain requiring IV Dilaudid.  Work-up showed creatinine of 1.65; CTA chest was negative for PE; right upper quad ultrasound showed cholelithiasis without cholecystitis.  He was started on IV fluids.  General surgery was consulted.  Discharge Diagnoses:  Principal Problem:   Type 2 diabetes mellitus with hyperglycemia (HCC) Active Problems:   Prostatic hyperplasia   AKI (acute kidney injury) ([facility])   Hypertension   Normocytic anemia   Right flank pain   Metastatic renal cell carcinoma (HCC)   Continuous severe abdominal pain  Severe right flank/abdominal pain Cholelithiasis History of recent T12 and L4 compression fractures -Presented with worsening right flank/abdominal pain for 5 weeks and has had multiple ER visits and scans including CT of abdomen and pelvis/MRI of thoracolumbar spine.  He was initially prescribed prednisone as an outpatient with  slight improvement in the pain but since then the pain has worsened. -Unclear cause of the pain: most likely musculoskeletal or neuropathic -Gabapentin dose increased on 7/18 and seems to be helping -currently on as needed oxycodone and gabapentin -General surgery following: Plans for outpatient follow-up with consideration for cholecystectomy as an outpatient if indicated   Constipation -He had not had a bowel movement in several days -received enemas with some bowel movements -on senna and miralax -also started on Linzess   Acute kidney injury -Possibly from dehydration.  Creatinine 1.65 on presentation.  Resolved.  Off IV fluids. -resume ACE on discharge  Hyponatremia -Resolved.   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 Alen Blew.  Currently undergoing chemotherapy.  Oncology follow-up appreciated.   Prostatic hyperplasia -continue finasteride  Hypertension -Blood pressure currently stable.  Continue amlodipine.  resume lisinopril on discharge  Diabetes mellitus type 2 with hyperglycemia -A1c 10.8.  resume oral regimen on discharge -follow up with pcp for further adjustments of meds   Hyperlipidemia -Continue statin  Depression -continue sertraline  Discharge Instructions  Discharge Instructions     Diet - low sodium heart healthy   Complete by: As directed    Increase activity slowly   Complete by: As directed       Allergies as of 04/04/2022       Reactions   Codeine Nausea And Vomiting        Medication List     STOP taking these medications    Advil 200 MG tablet Generic drug: ibuprofen   bisacodyl 10 MG suppository Commonly known as: Dulcolax   HYDROcodone-acetaminophen 5-325 MG tablet Commonly known as: Norco   HYDROmorphone 2 MG tablet Commonly known as: Dilaudid  hydrOXYzine 10 MG tablet Commonly known as: ATARAX   predniSONE 10 MG  tablet Commonly known as: DELTASONE   traMADol 50 MG tablet Commonly known as: ULTRAM   triamcinolone cream 0.1 % Commonly known as: KENALOG       TAKE these medications    acetaminophen 500 MG tablet Commonly known as: TYLENOL Take 1,000 mg by mouth every 6 (six) hours as needed (pain).   amLODipine 10 MG tablet Commonly known as: NORVASC Take 10 mg by mouth at bedtime.   aspirin EC 81 MG tablet Take 81 mg by mouth every morning. Swallow whole.   calcitonin (salmon) 200 UNIT/ACT nasal spray Commonly known as: MIACALCIN/FORTICAL Place 1 spray into alternate nostrils daily.   cetirizine 10 MG tablet Commonly known as: ZYRTEC Take 10 mg by mouth daily as needed for allergies or rhinitis.   cimetidine 200 MG tablet Commonly known as: TAGAMET Take 200 mg by mouth daily as needed (for indigestion).   finasteride 5 MG tablet Commonly known as: PROSCAR Take 5 mg by mouth daily.   gabapentin 400 MG capsule Commonly known as: NEURONTIN Take 1 capsule (400 mg total) by mouth 3 (three) times daily.   glipiZIDE 10 MG tablet Commonly known as: GLUCOTROL Take 10 mg by mouth daily.   latanoprost 0.005 % ophthalmic solution Commonly known as: XALATAN Place 1 drop into the left eye at bedtime.   lidocaine-prilocaine cream Commonly known as: EMLA Apply 1 application topically as needed.   linaclotide 145 MCG Caps capsule Commonly known as: LINZESS Take 1 capsule (145 mcg total) by mouth daily before breakfast. Start taking on: April 05, 2022   lisinopril 40 MG tablet Commonly known as: ZESTRIL Take 40 mg by mouth at bedtime.   metFORMIN 1000 MG tablet Commonly known as: GLUCOPHAGE Take 1,000 mg by mouth in the morning and at bedtime.   oxyCODONE 5 MG immediate release tablet Commonly known as: Oxy IR/ROXICODONE Take 1-2 tablets (5-10 mg total) by mouth every 6 (six) hours as needed for moderate pain.   pantoprazole 40 MG tablet Commonly known as: PROTONIX Take  1 tablet (40 mg total) by mouth 2 (two) times daily.   phenylephrine 1 % nasal spray Commonly known as: NEO-SYNEPHRINE Place 1 drop into both nostrils every 6 (six) hours as needed for congestion.   polyethylene glycol 17 g packet Commonly known as: MIRALAX / GLYCOLAX Take 17 g by mouth daily. Start taking on: April 05, 2022   prochlorperazine 10 MG tablet Commonly known as: COMPAZINE Take 1 tablet (10 mg total) by mouth every 6 (six) hours as needed for nausea or vomiting.   senna-docusate 8.6-50 MG tablet Commonly known as: Senokot-S Take 1 tablet by mouth daily.   sertraline 50 MG tablet Commonly known as: ZOLOFT Take 50 mg by mouth daily at 12 noon.   simvastatin 20 MG tablet Commonly known as: ZOCOR Take 20 mg by mouth at bedtime.   sodium phosphate 7-19 GM/118ML Enem Place 133 mLs (1 enema total) rectally daily as needed for up to 1 dose for severe constipation.        Follow-up Information     Coralie Keens, MD. Go on 04/28/2022.   Specialty: General Surgery Why: 10:50 AM. Please arrive 30 min prior to appointment time. Have ID and insurance card with you. Contact information: [address] [place] [address] [phone]         [name], MD. Schedule an appointment as soon as possible  for a visit in 2 week(s).   Specialty: Family Medicine Contact information: [address] [place] [address] [phone]                Allergies  Allergen Reactions   Codeine Nausea And Vomiting    Consultations: Gen surgery   Procedures/Studies: US Abdomen Limited RUQ (LIVER/GB)  Result Date: 03/28/2022 CLINICAL DATA:  Cholelithiasis EXAM: ULTRASOUND ABDOMEN LIMITED RIGHT UPPER QUADRANT COMPARISON:  Chest CT March 28, 2022 FINDINGS: Gallbladder: Multiple stones in the gallbladder lumen. No gallbladder wall thickening or pericholecystic fluid. Negative sonographic Murphy's sign. Common bile duct: Diameter: 4 mm Liver: No focal  lesion identified. Within normal limits in parenchymal echogenicity. Portal vein is patent on color Doppler imaging with normal direction of blood flow towards the liver. Other: None. IMPRESSION: Cholelithiasis without secondary signs of acute cholecystitis. Electronically Signed   By: Lovey Newcomer M.D.   On: 03/28/2022 17:02   CT Angio Chest PE W and/or Wo Contrast  Result Date: 03/28/2022 CLINICAL DATA:  Patient with recent diagnosis of ureteral carcinoma. Weight loss. EXAM: CT ANGIOGRAPHY CHEST WITH CONTRAST TECHNIQUE: Multidetector CT imaging of the chest was performed using the standard protocol during bolus administration of intravenous contrast. Multiplanar CT image reconstructions and MIPs were obtained to evaluate the vascular anatomy. RADIATION DOSE REDUCTION: This exam was performed according to the departmental dose-optimization program which includes automated exposure control, adjustment of the mA and/or kV according to patient size and/or use of iterative reconstruction technique. CONTRAST:  20m OMNIPAQUE IOHEXOL 350 MG/ML SOLN COMPARISON:  CT C AP Feb 09, 2022. FINDINGS: Cardiovascular: Right anterior chest wall Port-A-Cath is present with tip terminating in the superior vena cava. Normal heart size. Trace fluid superior pericardial recess. Thoracic aortic vascular calcifications. Motion artifact limits evaluation within the left lower lobe. No evidence for acute pulmonary embolus. Mediastinum/Nodes: No enlarged axillary, mediastinal or hilar lymphadenopathy. Normal appearance of the esophagus. Lungs/Pleura: Central airways are patent. Dependent atelectasis within the lower lobes bilaterally. No pleural effusion or pneumothorax. Upper Abdomen: Cholelithiasis.  No acute process. Musculoskeletal: Stable 6 mm sclerotic lesion within the left first rib (image 10; series 6), likely bone island. Stable sclerotic lesion within the T11 vertebral body measuring 9 mm (image 89; series 9), potentially bone  island. Review of the MIP images confirms the above findings. IMPRESSION: No evidence for acute pulmonary embolus. Cholelithiasis Small sclerotic lesion within the left first rib, stable. Additionally, small sclerotic lesion within the T11 vertebral body, stable, likely representing bone island. Recommend attention on follow-up exams. Aortic Atherosclerosis (ICD10-I70.0). Electronically Signed   By: DLovey NewcomerM.D.   On: 03/28/2022 13:58   CT Renal Stone Study  Result Date: 03/12/2022 CLINICAL DATA:  Metastatic high-grade urothelial carcinoma, prior right nephrectomy, ongoing immunotherapy with active nodal metastasis. Ongoing right flank pain and abdominal pain for 3 weeks with nausea. * Tracking Code: BO * EXAM: CT ABDOMEN AND PELVIS WITHOUT CONTRAST TECHNIQUE: Multidetector CT imaging of the abdomen and pelvis was performed following the standard protocol without IV contrast. RADIATION DOSE REDUCTION: This exam was performed according to the departmental dose-optimization program which includes automated exposure control, adjustment of the mA and/or kV according to patient size and/or use of iterative reconstruction technique. COMPARISON:  Multiple exams, including 03/05/2022 FINDINGS: Lower chest: Cylindrical bronchiectasis in both lower lobes. Stable scarring in both lower lobes. Right coronary artery atherosclerosis and mild aortic valve calcification. Descending thoracic aortic atherosclerosis. Hepatobiliary: Multiple small gallstones. 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 No appreciable biliary dilatation. Pancreas: Chronic calcific pancreatitis without findings of active inflammation. Spleen: Unremarkable Adrenals/Urinary Tract: The left adrenal gland appears normal. Right adrenal gland not well seen. The right kidney is absent. No findings of tumor along the nephrectomy bed. Stomach/Bowel:  Descending and sigmoid colon diverticulosis. Indirect left inguinal hernia containing adipose tissue and part of the sigmoid colon without compelling findings of obstruction. No definite abnormal wall thickening or inflammatory findings in the herniated sigmoid colon to indicate strangulation or other bowel distress. Normal appendix. Vascular/Lymphatic: Atherosclerosis is present, including aortoiliac atherosclerotic disease. 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 Below the left renal vein, an indistinctly marginated lymph node measures 2.3 cm in short axis (image 41, series 2) formerly the same on 03/05/2022. No other pathologic adenopathy is observed. Reproductive: Unremarkable Other: No supplemental non-categorized findings. Musculoskeletal: Multilevel Schmorl's nodes with degenerative endplate findings at R6-0 and L5-S1. Lumbar spondylosis and degenerative disc disease causing bilateral foraminal impingement at L4-5 and L5-S1. Chronic mild superior endplate compression at T12 with chronic bone island in the T11 vertebral body. Supraumbilical hernia contains adipose tissue appears bilobed for example on image 81 series 5. Left indirect inguinal hernia as described above in the stomach/bowel section. IMPRESSION: 1. A specific cause for right flank pain is not identified. 2. Indirect left inguinal hernia containing adipose tissue and sigmoid colon, without specific indicators of bowel distress or obstruction. There is scattered diverticulosis most notable in the sigmoid colon. 3. Pathologic adenopathy anterior to the abdominal aorta just below the left renal vein, this indistinctly marginated node measures 2.3 cm in short axis which is stable from 03/05/2022. 4. Other imaging findings of potential clinical significance: Cylindrical bronchiectasis in both lower lobes. Aortic Atherosclerosis (ICD10-I70.0). Coronary atherosclerosis. Mild aortic  valve calcification. 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 Cholelithiasis. Chronic calcific pancreatitis. Right nephrectomy and possible right adrenalectomy. Splenorenal shunting indicating portal venous hypertension. Foraminal impingement bilaterally at L4-5 and L5-S1. Supraumbilical hernia contains adipose tissue. Electronically Signed   By: Van Clines M.D.   On: 03/12/2022 14:58   CT ABDOMEN PELVIS W CONTRAST  Result Date: 03/05/2022 CLINICAL DATA:  Diffuse abdominal pain and constipation, history of prior ureteral malignancy EXAM: CT ABDOMEN AND PELVIS WITH CONTRAST TECHNIQUE: Multidetector CT imaging of the abdomen and pelvis was performed using the standard protocol following bolus administration of intravenous contrast. RADIATION DOSE REDUCTION: This exam was performed according to the departmental dose-optimization program which includes automated exposure control, adjustment of the mA and/or kV according to patient size and/or use of iterative reconstruction technique. CONTRAST:  50m OMNIPAQUE IOHEXOL 300 MG/ML  SOLN COMPARISON:  02/07/2022 FINDINGS: Lower chest: Lung bases show no focal infiltrate or sizable effusion. Hepatobiliary: Liver is within normal limits. Gallbladder is well distended with multiple dependent gallstones. Pancreas: Pancreas demonstrates multiple calcifications consistent with chronic pancreatitis. No focal mass or acute abnormality is noted. Spleen: Normal in size without focal abnormality. Adrenals/Urinary Tract: Right adrenal gland and right kidney have been surgically removed. No soft tissue is noted in the surgical bed. Left adrenal gland and left kidney are within normal limits. No obstructive changes are seen. The bladder is partially distended. Stomach/Bowel: Left inguinal hernia is noted containing a loop of sigmoid colon although no obstructive changes are seen. Scattered fecal material is noted consistent with a mild degree of  constipation. Mild diverticular  changes noted without evidence of diverticulitis. The appendix is within normal limits. Stomach and small bowel are unremarkable. Vascular/Lymphatic: Aortic atherosclerotic changes are noted. There is a persistent pre aortic lymph node with mild inflammatory changes identified and central necrosis. These changes are better visualized on the current exam due to contrast administration. The node measures approximately 2.1 x 2.3 cm in greatest AP and transverse dimensions respectively. This is slightly larger than that seen on the prior exam. No other significant lymphadenopathy is noted. Reproductive: Uterus and bilateral adnexa are unremarkable. Other: Left inguinal hernia is noted containing fat and loop of sigmoid colon. No free fluid or free air is noted. Musculoskeletal: Degenerative changes of lumbar spine are noted. IMPRESSION: Status post right nephrectomy and right adrenalectomy. No recurrent soft tissue changes are seen. Persistent preaortic lymph node is noted slightly larger than that seen on the most recent exam consistent with nodal metastatic disease. Changes of mild constipation.  No obstructive changes are seen. Left inguinal hernia which contains both fat and a loop of sigmoid colon without obstructive change. This is stable from the recent exam. Cholelithiasis without complicating factors. Changes of prior chronic pancreatitis. Electronically Signed   By: Inez Catalina M.D.   On: 03/05/2022 21:44      Subjective: Flank pain is better. Still has some lower abdominal pain that had some improvement after BM yesterday  Discharge Exam: Vitals:   04/03/22 1428 04/03/22 1950 04/03/22 2204 04/04/22 0349  BP: 136/81 113/66 130/68 (!) 142/72  Pulse: 74 70  67  Resp: '16 18  16  '$ Temp: 98.6 F (37 C) 98.6 F (37 C)  98.2 F (36.8 C)  TempSrc: Oral Oral  Oral  SpO2: 97% 97%  96%  Weight:    78 kg  Height:        General: Pt is alert, awake, not in acute  distress Cardiovascular: RRR, S1/S2 +, no rubs, no gallops Respiratory: CTA bilaterally, no wheezing, no rhonchi Abdominal: Soft, NT, ND, bowel sounds + Extremities: no edema, no cyanosis    The results of significant diagnostics from this hospitalization (including imaging, microbiology, ancillary and laboratory) are listed below for reference.     Microbiology: Recent Results (from the past 240 hour(s))  Resp Panel by RT-PCR (Flu A&B, Covid) Anterior Nasal Swab     Status: None   Collection Time: 03/25/22  2:29 PM   Specimen: Anterior Nasal Swab  Result Value Ref Range Status   SARS Coronavirus 2 by RT PCR NEGATIVE NEGATIVE Final    Comment: (NOTE) SARS-CoV-2 target nucleic acids are NOT DETECTED.  The SARS-CoV-2 RNA is generally detectable in upper respiratory specimens during the acute phase of infection. The lowest concentration of SARS-CoV-2 viral copies this assay can detect is 138 copies/mL. A negative result does not preclude SARS-Cov-2 infection and should not be used as the sole basis for treatment or other patient management decisions. A negative result may occur with  improper specimen collection/handling, submission of specimen other than nasopharyngeal swab, presence of viral mutation(s) within the areas targeted by this assay, and inadequate number of viral copies(<138 copies/mL). A negative result must be combined with clinical observations, patient history, and epidemiological information. The expected result is Negative.  Fact Sheet for Patients:  EntrepreneurPulse.com.au  Fact Sheet for Healthcare Providers:  IncredibleEmployment.be  This test is no t yet approved or cleared by the [place] FDA and  has been authorized for detection and/or diagnosis of SARS-CoV-2 by FDA under an Emergency Use Authorization (  EUA). This EUA will remain  in effect (meaning this test can be used) for the duration of the COVID-19  declaration under Section 564(b)(1) of the Act, 21 U.S.C.section 360bbb-3(b)(1), unless the authorization is terminated  or revoked sooner.       Influenza A by PCR NEGATIVE NEGATIVE Final   Influenza B by PCR NEGATIVE NEGATIVE Final    Comment: (NOTE) The Xpert Xpress SARS-CoV-2/FLU/RSV plus assay is intended as an aid in the diagnosis of influenza from Nasopharyngeal swab specimens and should not be used as a sole basis for treatment. Nasal washings and aspirates are unacceptable for Xpert Xpress SARS-CoV-2/FLU/RSV testing.  Fact Sheet for Patients: EntrepreneurPulse.com.au  Fact Sheet for Healthcare Providers: IncredibleEmployment.be  This test is not yet approved or cleared by the [place] FDA and has been authorized for detection and/or diagnosis of SARS-CoV-2 by FDA under an Emergency Use Authorization (EUA). This EUA will remain in effect (meaning this test can be used) for the duration of the COVID-19 declaration under Section 564(b)(1) of the Act, 21 U.S.C. section 360bbb-3(b)(1), unless the authorization is terminated or revoked.  Performed at [facility], [place] [address] [place], [place] [address]      Labs: BNP (last 3 results) No results for input(s): "BNP" in the last 8760 hours. Basic Metabolic Panel: Recent Labs  Lab 03/28/22 1232 03/28/22 1248 03/29/22 0533 03/30/22 0437 04/01/22 0445 04/02/22 0415 04/04/22 0354  NA 133* 134* 134* 136 137 139  --   K 4.1 4.1 4.9 4.4 4.1 3.9  --   CL 96* 98 101 106 107 106  --   CO2 25  --  '23 24 25 27  '$ --   GLUCOSE 448* 439* 282* 152* 165* 182*  --   BUN 32* 30* '20 13 14 15  '$ --   CREATININE 1.65* 1.60* 1.18 0.95 1.14 1.01 1.06  CALCIUM 9.1  --  8.6* 8.3* 8.4* 8.5*  --   MG  --   --   --  1.8 1.8 2.0  --    Liver Function Tests: Recent Labs  Lab 03/28/22 1232 03/29/22 0533 03/30/22 0437 04/01/22 0445  AST 11* 11* 10* 10*  ALT '13 10 9 9   '$ ALKPHOS 83 74 64 54  BILITOT 0.7 0.9 0.7 0.7  PROT 7.1 6.3* 5.8* 5.8*  ALBUMIN 3.3* 3.1* 2.7* 2.5*   No results for input(s): "LIPASE", "AMYLASE" in the last 168 hours. No results for input(s): "AMMONIA" in the last 168 hours. CBC: Recent Labs  Lab 03/28/22 1232 03/28/22 1248 03/29/22 0533 04/01/22 0445  WBC 9.5  --  9.6 6.1  NEUTROABS 7.5  --   --  4.5  HGB 12.4* 13.3 11.6* 9.8*  HCT 36.8* 39.0 35.4* 29.7*  MCV 87.4  --  89.8 89.2  PLT 245  --  169 164   Cardiac Enzymes: No results for input(s): "CKTOTAL", "CKMB", "CKMBINDEX", "TROPONINI" in the last 168 hours. BNP: Invalid input(s): "POCBNP" CBG: Recent Labs  Lab 04/03/22 1152 04/03/22 1644 04/03/22 1711 04/03/22 2124 04/04/22 0744  GLUCAP 172* 215* 179* 173* 173*   D-Dimer No results for input(s): "DDIMER" in the last 72 hours. Hgb A1c No results for input(s): "HGBA1C" in the last 72 hours. Lipid Profile No results for input(s): "CHOL", "HDL", "LDLCALC", "TRIG", "CHOLHDL", "LDLDIRECT" in the last 72 hours. Thyroid function studies Recent Labs    04/04/22 0354  TSH 3.160   Anemia work up Recent Labs    04/04/22 0354  [id number]  496   Urinalysis    Component Value Date/Time   COLORURINE YELLOW 03/28/2022 1216   APPEARANCEUR CLEAR 03/28/2022 1216   LABSPEC 1.032 (H) 03/28/2022 1216   PHURINE 5.0 03/28/2022 1216   GLUCOSEU >=500 (A) 03/28/2022 1216   HGBUR NEGATIVE 03/28/2022 1216   BILIRUBINUR NEGATIVE 03/28/2022 1216   KETONESUR 20 (A) 03/28/2022 1216   PROTEINUR 30 (A) 03/28/2022 1216   NITRITE NEGATIVE 03/28/2022 1216   LEUKOCYTESUR NEGATIVE 03/28/2022 1216   Sepsis Labs Recent Labs  Lab 03/28/22 1232 03/29/22 0533 04/01/22 0445  WBC 9.5 9.6 6.1   Microbiology Recent Results (from the past 240 hour(s))  Resp Panel by RT-PCR (Flu A&B, Covid) Anterior Nasal Swab     Status: None   Collection Time: 03/25/22  2:29 PM   Specimen: Anterior Nasal Swab  Result Value Ref Range Status    SARS Coronavirus 2 by RT PCR NEGATIVE NEGATIVE Final    Comment: (NOTE) SARS-CoV-2 target nucleic acids are NOT DETECTED.  The SARS-CoV-2 RNA is generally detectable in upper respiratory specimens during the acute phase of infection. The lowest concentration of SARS-CoV-2 viral copies this assay can detect is 138 copies/mL. A negative result does not preclude SARS-Cov-2 infection and should not be used as the sole basis for treatment or other patient management decisions. A negative result may occur with  improper specimen collection/handling, submission of specimen other than nasopharyngeal swab, presence of viral mutation(s) within the areas targeted by this assay, and inadequate number of viral copies(<138 copies/mL). A negative result must be combined with clinical observations, patient history, and epidemiological information. The expected result is Negative.  Fact Sheet for Patients:  EntrepreneurPulse.com.au  Fact Sheet for Healthcare Providers:  IncredibleEmployment.be  This test is no t yet approved or cleared by the [place] FDA and  has been authorized for detection and/or diagnosis of SARS-CoV-2 by FDA under an Emergency Use Authorization (EUA). This EUA will remain  in effect (meaning this test can be used) for the duration of the COVID-19 declaration under Section 564(b)(1) of the Act, 21 U.S.C.section 360bbb-3(b)(1), unless the authorization is terminated  or revoked sooner.       Influenza A by PCR NEGATIVE NEGATIVE Final   Influenza B by PCR NEGATIVE NEGATIVE Final    Comment: (NOTE) The Xpert Xpress SARS-CoV-2/FLU/RSV plus assay is intended as an aid in the diagnosis of influenza from Nasopharyngeal swab specimens and should not be used as a sole basis for treatment. Nasal washings and aspirates are unacceptable for Xpert Xpress SARS-CoV-2/FLU/RSV testing.  Fact Sheet for  Patients: EntrepreneurPulse.com.au  Fact Sheet for Healthcare Providers: IncredibleEmployment.be  This test is not yet approved or cleared by the [place] FDA and has been authorized for detection and/or diagnosis of SARS-CoV-2 by FDA under an Emergency Use Authorization (EUA). This EUA will remain in effect (meaning this test can be used) for the duration of the COVID-19 declaration under Section 564(b)(1) of the Act, 21 U.S.C. section 360bbb-3(b)(1), unless the authorization is terminated or revoked.  Performed at [facility], [place] [address] [place], [place] [address]      Time coordinating discharge: 79mns  SIGNED:   JKathie Dike MD  [facility] 04/04/2022, 10:49 AM   If 7PM-7AM, please contact night-coverage [email or web address]

## 2022-04-07 ENCOUNTER — Other Ambulatory Visit: Payer: Self-pay

## 2022-04-15 ENCOUNTER — Other Ambulatory Visit: Payer: Self-pay

## 2022-04-15 DIAGNOSIS — C661 Malignant neoplasm of right ureter: Secondary | ICD-10-CM | POA: Diagnosis not present

## 2022-04-15 DIAGNOSIS — S22000A Wedge compression fracture of unspecified thoracic vertebra, initial encounter for closed fracture: Secondary | ICD-10-CM | POA: Diagnosis not present

## 2022-04-15 DIAGNOSIS — C7910 Secondary malignant neoplasm of unspecified urinary organs: Secondary | ICD-10-CM | POA: Diagnosis not present

## 2022-04-15 DIAGNOSIS — Z6824 Body mass index (BMI) 24.0-24.9, adult: Secondary | ICD-10-CM | POA: Diagnosis not present

## 2022-04-15 DIAGNOSIS — E1122 Type 2 diabetes mellitus with diabetic chronic kidney disease: Secondary | ICD-10-CM | POA: Diagnosis not present

## 2022-04-15 DIAGNOSIS — N179 Acute kidney failure, unspecified: Secondary | ICD-10-CM | POA: Diagnosis not present

## 2022-04-15 DIAGNOSIS — R109 Unspecified abdominal pain: Secondary | ICD-10-CM | POA: Diagnosis not present

## 2022-04-15 DIAGNOSIS — S32000A Wedge compression fracture of unspecified lumbar vertebra, initial encounter for closed fracture: Secondary | ICD-10-CM | POA: Diagnosis not present

## 2022-04-28 ENCOUNTER — Inpatient Hospital Stay (HOSPITAL_BASED_OUTPATIENT_CLINIC_OR_DEPARTMENT_OTHER): Payer: Medicare Other | Admitting: Oncology

## 2022-04-28 ENCOUNTER — Inpatient Hospital Stay: Payer: Medicare Other | Attending: Oncology

## 2022-04-28 ENCOUNTER — Other Ambulatory Visit: Payer: Self-pay

## 2022-04-28 VITALS — BP 145/76 | HR 95 | Temp 98.1°F | Resp 20 | Wt 170.0 lb

## 2022-04-28 DIAGNOSIS — E119 Type 2 diabetes mellitus without complications: Secondary | ICD-10-CM | POA: Diagnosis not present

## 2022-04-28 DIAGNOSIS — M549 Dorsalgia, unspecified: Secondary | ICD-10-CM | POA: Diagnosis not present

## 2022-04-28 DIAGNOSIS — C679 Malignant neoplasm of bladder, unspecified: Secondary | ICD-10-CM | POA: Diagnosis not present

## 2022-04-28 DIAGNOSIS — C649 Malignant neoplasm of unspecified kidney, except renal pelvis: Secondary | ICD-10-CM

## 2022-04-28 DIAGNOSIS — D709 Neutropenia, unspecified: Secondary | ICD-10-CM | POA: Diagnosis not present

## 2022-04-28 DIAGNOSIS — D4959 Neoplasm of unspecified behavior of other genitourinary organ: Secondary | ICD-10-CM

## 2022-04-28 LAB — CBC WITH DIFFERENTIAL (CANCER CENTER ONLY)
Abs Immature Granulocytes: 0.05 10*3/uL (ref 0.00–0.07)
Basophils Absolute: 0 10*3/uL (ref 0.0–0.1)
Basophils Relative: 0 %
Eosinophils Absolute: 0.1 10*3/uL (ref 0.0–0.5)
Eosinophils Relative: 2 %
HCT: 30.8 % — ABNORMAL LOW (ref 39.0–52.0)
Hemoglobin: 10.1 g/dL — ABNORMAL LOW (ref 13.0–17.0)
Immature Granulocytes: 1 %
Lymphocytes Relative: 13 %
Lymphs Abs: 1.1 10*3/uL (ref 0.7–4.0)
MCH: 27.7 pg (ref 26.0–34.0)
MCHC: 32.8 g/dL (ref 30.0–36.0)
MCV: 84.4 fL (ref 80.0–100.0)
Monocytes Absolute: 0.6 10*3/uL (ref 0.1–1.0)
Monocytes Relative: 7 %
Neutro Abs: 6.3 10*3/uL (ref 1.7–7.7)
Neutrophils Relative %: 77 %
Platelet Count: 289 10*3/uL (ref 150–400)
RBC: 3.65 MIL/uL — ABNORMAL LOW (ref 4.22–5.81)
RDW: 13.7 % (ref 11.5–15.5)
WBC Count: 8.1 10*3/uL (ref 4.0–10.5)
nRBC: 0 % (ref 0.0–0.2)

## 2022-04-28 LAB — CMP (CANCER CENTER ONLY)
ALT: 5 U/L (ref 0–44)
AST: 9 U/L — ABNORMAL LOW (ref 15–41)
Albumin: 3.2 g/dL — ABNORMAL LOW (ref 3.5–5.0)
Alkaline Phosphatase: 159 U/L — ABNORMAL HIGH (ref 38–126)
Anion gap: 5 (ref 5–15)
BUN: 17 mg/dL (ref 8–23)
CO2: 27 mmol/L (ref 22–32)
Calcium: 9.1 mg/dL (ref 8.9–10.3)
Chloride: 103 mmol/L (ref 98–111)
Creatinine: 1.12 mg/dL (ref 0.61–1.24)
GFR, Estimated: >60 mL/min (ref >60)
Glucose, Bld: 279 mg/dL — ABNORMAL HIGH (ref 70–99)
Potassium: 3.8 mmol/L (ref 3.5–5.1)
Sodium: 135 mmol/L (ref 135–145)
Total Bilirubin: 0.4 mg/dL (ref 0.3–1.2)
Total Protein: 7.1 g/dL (ref 6.5–8.1)

## 2022-04-28 NOTE — Progress Notes (Signed)
Hematology and Oncology Follow Up Visit  Hector Lopez [id number] 1946/01/25 76 y.o. 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   Prior Therapy:  He underwent robotic assisted laparoscopic  nephroureterectomy completed on Feb 10, 2020.  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  He was found to have metastatic pericaval lymph node as well as a right common iliac lymph node.  Carboplatin and gemcitabine started on July 04, 2020.  He completed 6 cycles of therapy on October 17, 2020.  Current therapy: Nivolumab 480 mg every 4 weeks started on October 29, 2021.  He completed 4 cycles of therapy in June 2023.   Interim History: Hector Lopez returns today for a follow-up.  Since last visit, he was hospitalized in July 2023 for hypoglycemia after he was treated with prednisone for back pain.  Clinically, he reports continued complaints of pain and not feeling well.  His pain is predominantly in the lower back bilaterally with radiation to the breast.  He felt slightly better after his hospitalization but back to feeling poorly.  Continues to have dizziness and blurry vision but no falls or syncope.  His appetite has been overall poor.   Medications: Reviewed without changes. Current Outpatient Medications  Medication Sig Dispense Refill   acetaminophen (TYLENOL) 500 MG tablet Take 1,000 mg by mouth every 6 (six) hours as needed (pain).     amLODipine (NORVASC) 10 MG tablet Take 10 mg by mouth at bedtime.      aspirin EC 81 MG tablet Take 81 mg by mouth every morning. Swallow whole.     calcitonin, salmon, (MIACALCIN/FORTICAL) 200 UNIT/ACT nasal spray Place 1 spray into alternate nostrils daily.     cetirizine (ZYRTEC) 10 MG  tablet Take 10 mg by mouth daily as needed for allergies or rhinitis.     cimetidine (TAGAMET) 200 MG tablet Take 200 mg by mouth daily as needed (for indigestion).     finasteride (PROSCAR) 5 MG tablet Take 5 mg by mouth daily.     gabapentin (NEURONTIN) 400 MG capsule Take 1 capsule (400 mg total) by mouth 3 (three) times daily. 90 capsule 0   glipiZIDE (GLUCOTROL) 10 MG tablet Take 10 mg by mouth daily.     latanoprost (XALATAN) 0.005 % ophthalmic solution Place 1 drop into the left eye at bedtime.     lidocaine-prilocaine (EMLA) cream Apply 1 application topically as needed. (Patient not taking: Reported on 03/25/2022) 30 g 0   linaclotide (LINZESS) 145 MCG CAPS capsule Take 1 capsule (145 mcg total) by mouth daily before breakfast. 30 capsule 0   lisinopril (ZESTRIL) 40 MG tablet Take 40 mg by mouth at bedtime.      metFORMIN (GLUCOPHAGE) 1000 MG tablet Take 1,000 mg by mouth in the morning and at bedtime.     oxyCODONE (OXY IR/ROXICODONE) 5 MG immediate release tablet Take 1-2 tablets (5-10 mg total) by mouth every 6 (six) hours as needed for moderate pain. 15 tablet 0   pantoprazole (PROTONIX) 40 MG tablet Take 1 tablet (40 mg total) by mouth 2 (two) times daily. 30 tablet 0   phenylephrine (NEO-SYNEPHRINE) 1 % nasal spray Place 1 drop into both nostrils every 6 (six) hours as needed for congestion.     polyethylene glycol (MIRALAX /  GLYCOLAX) 17 g packet Take 17 g by mouth daily. 14 each 0   prochlorperazine (COMPAZINE) 10 MG tablet Take 1 tablet (10 mg total) by mouth every 6 (six) hours as needed for nausea or vomiting. 30 tablet 0   senna-docusate (SENOKOT-S) 8.6-50 MG tablet Take 1 tablet by mouth daily. 30 tablet 0   sertraline (ZOLOFT) 50 MG tablet Take 50 mg by mouth daily at 12 noon.     simvastatin (ZOCOR) 20 MG tablet Take 20 mg by mouth at bedtime.     sodium phosphate (FLEET) 7-19 GM/118ML ENEM Place 133 mLs (1 enema total) rectally daily as needed for up to 1 dose for severe  constipation. (Patient not taking: Reported on 03/25/2022) 133 mL 2   No current facility-administered medications for this visit.     Allergies:  Allergies  Allergen Reactions   Codeine Nausea And Vomiting      Physical Exam:      Blood pressure (!) 145/76, pulse 95, temperature 98.1 F (36.7 C), resp. rate 20, weight 170 lb (77.1 kg), SpO2 99 %.      ECOG: 1   General appearance: Comfortable appearing without any discomfort Head: Normocephalic without any trauma Oropharynx: Mucous membranes are moist and pink without any thrush or ulcers. Eyes: Pupils are equal and round reactive to light. Lymph nodes: No cervical, supraclavicular, inguinal or axillary lymphadenopathy.   Heart:regular rate and rhythm.  S1 and S2 without leg edema. Lung: Clear without any rhonchi or wheezes.  No dullness to percussion. Abdomin: Soft, nontender, nondistended with good bowel sounds.  No hepatosplenomegaly. Musculoskeletal: No joint deformity or effusion.  Full range of motion noted. Neurological: No deficits noted on motor, sensory and deep tendon reflex exam. Skin: No petechial rash or dryness.  Appeared moist.                  Lab Results: Lab Results  Component Value Date   WBC 6.1 04/01/2022   HGB 9.8 (L) 04/01/2022   HCT 29.7 (L) 04/01/2022   MCV 89.2 04/01/2022   PLT 164 04/01/2022     Chemistry      Component Value Date/Time   NA 139 04/02/2022 0415   K 3.9 04/02/2022 0415   CL 106 04/02/2022 0415   CO2 27 04/02/2022 0415   BUN 15 04/02/2022 0415   CREATININE 1.06 04/04/2022 0354   CREATININE 1.80 (H) 03/25/2022 1241      Component Value Date/Time   CALCIUM 8.5 (L) 04/02/2022 0415   ALKPHOS 54 04/01/2022 0445   AST 10 (L) 04/01/2022 0445   AST 9 (L) 03/25/2022 1241   ALT 9 04/01/2022 0445   ALT 13 03/25/2022 1241   BILITOT 0.7 04/01/2022 0445   BILITOT 0.4 03/25/2022 1241          Impression and Plan:  76 year old man with:   1.   Ureteral cancer developed pelvic adenopathy indicating stage IV high-grade urothelial carcinoma in 2023.   His disease status was updated at this time and treatment choices were reviewed.  He has received nivolumab therapy and therapy has been withheld due to intractable pain of unclear etiology.  At this time, I have recommended updating his staging with a PET scan.  Given the vague complaints he has experienced it is unclear to me if this is related to his cancer his cancer treatment.  Obtaining a PET scan will help differentiate whether he has relapsed at this time.  Salvage therapy options including Padcev could be  reinstituted if he has clear-cut progressive disease.  Radiation therapy could also be utilized if he has isolated areas of metastasis that could be responsible for his pain.   2.  IV access: Port-A-Cath will continue to be in use Future.  3.  Immune mediated issues: Complications such as pneumonitis, colitis myositis were reiterated.  It does not appear that his symptoms are autoimmune in nature.   4.  Hyperglycemia: Resolved and currently follows with his primary care physician regarding his diabetes.  His blood sugar continues to be high I recommended they be restarting insulin.   6.  Back pain: Remains due to unclear etiology at this time.  We will obtain a PET scan to determine if he has cancer relapse.    7.  Follow-up: We will be in the next few weeks to follow his progress.   30  minutes were dedicated to this encounter.  The time spent on reviewing laboratory data, disease status update and outlining future plan of care discussion.      Zola Button, MD 8/14/202310:22 AM

## 2022-04-29 ENCOUNTER — Encounter: Payer: Self-pay | Admitting: Oncology

## 2022-04-29 ENCOUNTER — Other Ambulatory Visit: Payer: Self-pay | Admitting: Oncology

## 2022-04-29 MED ORDER — GABAPENTIN 400 MG PO CAPS: 400.0000 mg | ORAL_CAPSULE | Freq: Three times a day (TID) | ORAL | 3 refills | Status: DC

## 2022-04-30 ENCOUNTER — Other Ambulatory Visit: Payer: Self-pay

## 2022-04-30 ENCOUNTER — Other Ambulatory Visit: Payer: Self-pay | Admitting: Oncology

## 2022-04-30 MED ORDER — TRAMADOL HCL 50 MG PO TABS: 50.0000 mg | ORAL_TABLET | Freq: Four times a day (QID) | ORAL | 1 refills | Status: DC | PRN

## 2022-05-04 DIAGNOSIS — E1122 Type 2 diabetes mellitus with diabetic chronic kidney disease: Secondary | ICD-10-CM | POA: Diagnosis not present

## 2022-05-04 DIAGNOSIS — C661 Malignant neoplasm of right ureter: Secondary | ICD-10-CM | POA: Diagnosis not present

## 2022-05-04 DIAGNOSIS — Z6824 Body mass index (BMI) 24.0-24.9, adult: Secondary | ICD-10-CM | POA: Diagnosis not present

## 2022-05-04 DIAGNOSIS — S22000A Wedge compression fracture of unspecified thoracic vertebra, initial encounter for closed fracture: Secondary | ICD-10-CM | POA: Diagnosis not present

## 2022-05-04 DIAGNOSIS — S32000A Wedge compression fracture of unspecified lumbar vertebra, initial encounter for closed fracture: Secondary | ICD-10-CM | POA: Diagnosis not present

## 2022-05-04 DIAGNOSIS — N179 Acute kidney failure, unspecified: Secondary | ICD-10-CM | POA: Diagnosis not present

## 2022-05-04 DIAGNOSIS — R109 Unspecified abdominal pain: Secondary | ICD-10-CM | POA: Diagnosis not present

## 2022-05-04 DIAGNOSIS — C7910 Secondary malignant neoplasm of unspecified urinary organs: Secondary | ICD-10-CM | POA: Diagnosis not present

## 2022-05-07 ENCOUNTER — Encounter (HOSPITAL_COMMUNITY)
Admission: RE | Admit: 2022-05-07 | Discharge: 2022-05-07 | Disposition: A | Discharge status: Home / Self Care | Payer: Medicare Other | Source: Ambulatory Visit | Attending: Oncology | Admitting: Oncology

## 2022-05-07 ENCOUNTER — Telehealth: Payer: Self-pay | Admitting: Oncology

## 2022-05-07 DIAGNOSIS — K4090 Unilateral inguinal hernia, without obstruction or gangrene, not specified as recurrent: Secondary | ICD-10-CM | POA: Diagnosis not present

## 2022-05-07 DIAGNOSIS — K5730 Diverticulosis of large intestine without perforation or abscess without bleeding: Secondary | ICD-10-CM | POA: Diagnosis not present

## 2022-05-07 DIAGNOSIS — J320 Chronic maxillary sinusitis: Secondary | ICD-10-CM | POA: Diagnosis not present

## 2022-05-07 DIAGNOSIS — I6523 Occlusion and stenosis of bilateral carotid arteries: Secondary | ICD-10-CM | POA: Insufficient documentation

## 2022-05-07 DIAGNOSIS — K8020 Calculus of gallbladder without cholecystitis without obstruction: Secondary | ICD-10-CM | POA: Diagnosis not present

## 2022-05-07 DIAGNOSIS — C649 Malignant neoplasm of unspecified kidney, except renal pelvis: Secondary | ICD-10-CM | POA: Diagnosis not present

## 2022-05-07 DIAGNOSIS — I700 Atherosclerosis of aorta: Secondary | ICD-10-CM | POA: Insufficient documentation

## 2022-05-07 DIAGNOSIS — K861 Other chronic pancreatitis: Secondary | ICD-10-CM | POA: Diagnosis not present

## 2022-05-07 DIAGNOSIS — Z905 Acquired absence of kidney: Secondary | ICD-10-CM | POA: Diagnosis not present

## 2022-05-07 LAB — GLUCOSE, CAPILLARY: Glucose-Capillary: 212 mg/dL — ABNORMAL HIGH (ref 70–99)

## 2022-05-07 MED ORDER — FLUDEOXYGLUCOSE F - 18 (FDG) INJECTION
8.3000 | Freq: Once | INTRAVENOUS | Status: AC | PRN
  Administered 2022-05-07: 8.3 via INTRAVENOUS

## 2022-05-07 NOTE — Telephone Encounter (Signed)
Called patient regarding upcoming September appointment, left a voicemail.  

## 2022-05-08 ENCOUNTER — Other Ambulatory Visit: Payer: Self-pay | Admitting: Oncology

## 2022-05-08 MED ORDER — MEGESTROL ACETATE 400 MG/10ML PO SUSP: 400.0000 mg | Freq: Every day | ORAL | 1 refills | Status: DC

## 2022-05-08 NOTE — Progress Notes (Signed)
The results of the PET scan were personally reviewed and discussed with the patient and Dr. Tammi Klippel.  He has isolated area of metastasis in the periaortic lymph node which could be amendable to radiation therapy.  He continues to have symptoms of fatigue weakness as well as back pain.  Not sure if the symptoms are related to his cancer progression but certainly we will attempt to treat the.  Prescription for Megace will be sent to him to boost his appetite as well.

## 2022-05-09 ENCOUNTER — Telehealth: Payer: Self-pay | Admitting: Radiation Oncology

## 2022-05-09 ENCOUNTER — Other Ambulatory Visit: Payer: Self-pay | Admitting: Oncology

## 2022-05-09 NOTE — Telephone Encounter (Signed)
LVM to sched CON with Dr. Manning 

## 2022-05-12 ENCOUNTER — Telehealth: Payer: Self-pay | Admitting: Radiation Oncology

## 2022-05-12 ENCOUNTER — Telehealth: Payer: Self-pay | Admitting: *Deleted

## 2022-05-12 NOTE — Telephone Encounter (Signed)
RETURNED PATIENT'S PHONE CALL, SPOKE WITH PATIENT. ?

## 2022-05-12 NOTE — Telephone Encounter (Signed)
LVM to sched CON with Dr. Manning 

## 2022-05-13 ENCOUNTER — Telehealth: Payer: Self-pay | Admitting: *Deleted

## 2022-05-13 ENCOUNTER — Encounter (HOSPITAL_COMMUNITY): Payer: Self-pay

## 2022-05-13 ENCOUNTER — Emergency Department (HOSPITAL_COMMUNITY): Payer: Medicare Other

## 2022-05-13 ENCOUNTER — Other Ambulatory Visit: Payer: Self-pay

## 2022-05-13 ENCOUNTER — Inpatient Hospital Stay (HOSPITAL_COMMUNITY)
Admission: EM | Admit: 2022-05-13 | Discharge: 2022-05-16 | DRG: 418 | Disposition: A | Discharge status: Home / Self Care | Payer: Medicare Other | Attending: Surgery | Admitting: Surgery

## 2022-05-13 DIAGNOSIS — G8929 Other chronic pain: Secondary | ICD-10-CM | POA: Diagnosis present

## 2022-05-13 DIAGNOSIS — K8018 Calculus of gallbladder with other cholecystitis without obstruction: Secondary | ICD-10-CM | POA: Diagnosis not present

## 2022-05-13 DIAGNOSIS — K439 Ventral hernia without obstruction or gangrene: Secondary | ICD-10-CM | POA: Diagnosis present

## 2022-05-13 DIAGNOSIS — Z8554 Personal history of malignant neoplasm of ureter: Secondary | ICD-10-CM

## 2022-05-13 DIAGNOSIS — K219 Gastro-esophageal reflux disease without esophagitis: Secondary | ICD-10-CM | POA: Diagnosis present

## 2022-05-13 DIAGNOSIS — Z885 Allergy status to narcotic agent status: Secondary | ICD-10-CM | POA: Diagnosis not present

## 2022-05-13 DIAGNOSIS — K8020 Calculus of gallbladder without cholecystitis without obstruction: Principal | ICD-10-CM | POA: Diagnosis present

## 2022-05-13 DIAGNOSIS — R1011 Right upper quadrant pain: Principal | ICD-10-CM

## 2022-05-13 DIAGNOSIS — M549 Dorsalgia, unspecified: Secondary | ICD-10-CM | POA: Diagnosis present

## 2022-05-13 DIAGNOSIS — C689 Malignant neoplasm of urinary organ, unspecified: Secondary | ICD-10-CM | POA: Diagnosis present

## 2022-05-13 DIAGNOSIS — Z7984 Long term (current) use of oral hypoglycemic drugs: Secondary | ICD-10-CM | POA: Diagnosis not present

## 2022-05-13 DIAGNOSIS — R188 Other ascites: Secondary | ICD-10-CM | POA: Diagnosis not present

## 2022-05-13 DIAGNOSIS — E1165 Type 2 diabetes mellitus with hyperglycemia: Secondary | ICD-10-CM | POA: Diagnosis not present

## 2022-05-13 DIAGNOSIS — Z7982 Long term (current) use of aspirin: Secondary | ICD-10-CM | POA: Diagnosis not present

## 2022-05-13 DIAGNOSIS — E119 Type 2 diabetes mellitus without complications: Secondary | ICD-10-CM | POA: Diagnosis not present

## 2022-05-13 DIAGNOSIS — Z905 Acquired absence of kidney: Secondary | ICD-10-CM | POA: Diagnosis not present

## 2022-05-13 DIAGNOSIS — M4854XA Collapsed vertebra, not elsewhere classified, thoracic region, initial encounter for fracture: Secondary | ICD-10-CM | POA: Diagnosis present

## 2022-05-13 DIAGNOSIS — I10 Essential (primary) hypertension: Secondary | ICD-10-CM | POA: Diagnosis present

## 2022-05-13 DIAGNOSIS — C641 Malignant neoplasm of right kidney, except renal pelvis: Secondary | ICD-10-CM | POA: Diagnosis not present

## 2022-05-13 DIAGNOSIS — K8010 Calculus of gallbladder with chronic cholecystitis without obstruction: Secondary | ICD-10-CM | POA: Diagnosis not present

## 2022-05-13 DIAGNOSIS — E785 Hyperlipidemia, unspecified: Secondary | ICD-10-CM

## 2022-05-13 DIAGNOSIS — Z79899 Other long term (current) drug therapy: Secondary | ICD-10-CM | POA: Diagnosis not present

## 2022-05-13 DIAGNOSIS — E782 Mixed hyperlipidemia: Secondary | ICD-10-CM

## 2022-05-13 DIAGNOSIS — R1084 Generalized abdominal pain: Secondary | ICD-10-CM | POA: Diagnosis not present

## 2022-05-13 DIAGNOSIS — D4959 Neoplasm of unspecified behavior of other genitourinary organ: Secondary | ICD-10-CM | POA: Diagnosis present

## 2022-05-13 LAB — COMPREHENSIVE METABOLIC PANEL
ALT: 12 U/L (ref 0–44)
AST: 14 U/L — ABNORMAL LOW (ref 15–41)
Albumin: 3 g/dL — ABNORMAL LOW (ref 3.5–5.0)
Alkaline Phosphatase: 127 U/L — ABNORMAL HIGH (ref 38–126)
Anion gap: 9 (ref 5–15)
BUN: 19 mg/dL (ref 8–23)
CO2: 24 mmol/L (ref 22–32)
Calcium: 9 mg/dL (ref 8.9–10.3)
Chloride: 100 mmol/L (ref 98–111)
Creatinine, Ser: 1.09 mg/dL (ref 0.61–1.24)
GFR, Estimated: >60 mL/min (ref >60)
Glucose, Bld: 265 mg/dL — ABNORMAL HIGH (ref 70–99)
Potassium: 4.3 mmol/L (ref 3.5–5.1)
Sodium: 133 mmol/L — ABNORMAL LOW (ref 135–145)
Total Bilirubin: 0.7 mg/dL (ref 0.3–1.2)
Total Protein: 7.4 g/dL (ref 6.5–8.1)

## 2022-05-13 LAB — URINALYSIS, ROUTINE W REFLEX MICROSCOPIC
Bilirubin Urine: NEGATIVE
Glucose, UA: 50 mg/dL — AB
Hgb urine dipstick: NEGATIVE
Ketones, ur: 5 mg/dL — AB
Nitrite: NEGATIVE
Protein, ur: 30 mg/dL — AB
Specific Gravity, Urine: 1.032 — ABNORMAL HIGH (ref 1.005–1.030)
pH: 5 (ref 5.0–8.0)

## 2022-05-13 LAB — GLUCOSE, CAPILLARY
Glucose-Capillary: 192 mg/dL — ABNORMAL HIGH (ref 70–99)
Glucose-Capillary: 143 mg/dL — ABNORMAL HIGH (ref 70–99)

## 2022-05-13 LAB — LIPASE, BLOOD: Lipase: 30 U/L (ref 11–51)

## 2022-05-13 LAB — SURGICAL PCR SCREEN
MRSA, PCR: NEGATIVE
Staphylococcus aureus: POSITIVE — AB

## 2022-05-13 LAB — CBC
HCT: 34.2 % — ABNORMAL LOW (ref 39.0–52.0)
Hemoglobin: 10.6 g/dL — ABNORMAL LOW (ref 13.0–17.0)
MCH: 27.1 pg (ref 26.0–34.0)
MCHC: 31 g/dL (ref 30.0–36.0)
MCV: 87.5 fL (ref 80.0–100.0)
Platelets: 451 10*3/uL — ABNORMAL HIGH (ref 150–400)
RBC: 3.91 MIL/uL — ABNORMAL LOW (ref 4.22–5.81)
RDW: 14.5 % (ref 11.5–15.5)
WBC: 11.5 10*3/uL — ABNORMAL HIGH (ref 4.0–10.5)
nRBC: 0 % (ref 0.0–0.2)

## 2022-05-13 MED ORDER — PANTOPRAZOLE SODIUM 40 MG PO TBEC
40.0000 mg | DELAYED_RELEASE_TABLET | Freq: Two times a day (BID) | ORAL | Status: DC
  Administered 2022-05-13 – 2022-05-16 (×6): 40 mg via ORAL
  Filled 2022-05-13 (×6): qty 1

## 2022-05-13 MED ORDER — INSULIN ASPART 100 UNIT/ML IJ SOLN
0.0000 [IU] | Freq: Three times a day (TID) | INTRAMUSCULAR | Status: DC
  Administered 2022-05-13: 3 [IU] via SUBCUTANEOUS
  Administered 2022-05-14: 11 [IU] via SUBCUTANEOUS
  Administered 2022-05-14 – 2022-05-15 (×2): 5 [IU] via SUBCUTANEOUS
  Administered 2022-05-15: 8 [IU] via SUBCUTANEOUS
  Administered 2022-05-15 – 2022-05-16 (×2): 3 [IU] via SUBCUTANEOUS
  Filled 2022-05-13: qty 0.15

## 2022-05-13 MED ORDER — MEGESTROL ACETATE 400 MG/10ML PO SUSP
400.0000 mg | Freq: Every day | ORAL | Status: DC
  Administered 2022-05-15 – 2022-05-16 (×2): 400 mg via ORAL
  Filled 2022-05-13 (×3): qty 10

## 2022-05-13 MED ORDER — PROCHLORPERAZINE EDISYLATE 10 MG/2ML IJ SOLN: 5.0000 mg | Freq: Four times a day (QID) | INTRAMUSCULAR | Status: DC | PRN

## 2022-05-13 MED ORDER — AMLODIPINE BESYLATE 10 MG PO TABS
10.0000 mg | ORAL_TABLET | Freq: Every day | ORAL | Status: DC
  Administered 2022-05-13 – 2022-05-15 (×3): 10 mg via ORAL
  Filled 2022-05-13 (×3): qty 1

## 2022-05-13 MED ORDER — SERTRALINE HCL 50 MG PO TABS
50.0000 mg | ORAL_TABLET | Freq: Every day | ORAL | Status: DC
  Administered 2022-05-14 – 2022-05-15 (×2): 50 mg via ORAL
  Filled 2022-05-13 (×2): qty 1

## 2022-05-13 MED ORDER — HYDROMORPHONE HCL 2 MG/ML IJ SOLN
1.0000 mg | INTRAMUSCULAR | Status: DC | PRN
  Administered 2022-05-13 – 2022-05-15 (×8): 1 mg via INTRAVENOUS
  Filled 2022-05-13 (×9): qty 1

## 2022-05-13 MED ORDER — PROCHLORPERAZINE MALEATE 10 MG PO TABS
10.0000 mg | ORAL_TABLET | Freq: Four times a day (QID) | ORAL | Status: DC | PRN
Start: 2022-05-13 — End: 2022-05-16

## 2022-05-13 MED ORDER — OXYCODONE HCL 5 MG PO TABS
5.0000 mg | ORAL_TABLET | ORAL | Status: DC | PRN
  Administered 2022-05-16: 10 mg via ORAL
  Filled 2022-05-13 (×2): qty 2

## 2022-05-13 MED ORDER — LISINOPRIL 20 MG PO TABS
40.0000 mg | ORAL_TABLET | Freq: Every day | ORAL | Status: DC
  Administered 2022-05-13 – 2022-05-15 (×3): 40 mg via ORAL
  Filled 2022-05-13: qty 4
  Filled 2022-05-13 (×2): qty 2

## 2022-05-13 MED ORDER — ONDANSETRON 4 MG PO TBDP: 4.0000 mg | ORAL_TABLET | Freq: Four times a day (QID) | ORAL | Status: DC | PRN

## 2022-05-13 MED ORDER — SODIUM CHLORIDE 0.9 % IV BOLUS
1000.0000 mL | Freq: Once | INTRAVENOUS | Status: AC
  Administered 2022-05-13: 1000 mL via INTRAVENOUS

## 2022-05-13 MED ORDER — SODIUM CHLORIDE 0.9 % IV SOLN
2.0000 g | INTRAVENOUS | Status: DC
  Administered 2022-05-13 – 2022-05-15 (×3): 2 g via INTRAVENOUS
  Filled 2022-05-13 (×3): qty 20

## 2022-05-13 MED ORDER — SIMETHICONE 80 MG PO CHEW: 40.0000 mg | CHEWABLE_TABLET | Freq: Four times a day (QID) | ORAL | Status: DC | PRN

## 2022-05-13 MED ORDER — ACETAMINOPHEN 325 MG PO TABS: 650.0000 mg | ORAL_TABLET | Freq: Four times a day (QID) | ORAL | Status: DC | PRN

## 2022-05-13 MED ORDER — GABAPENTIN 400 MG PO CAPS
400.0000 mg | ORAL_CAPSULE | Freq: Three times a day (TID) | ORAL | Status: DC
  Administered 2022-05-13 – 2022-05-16 (×8): 400 mg via ORAL
  Filled 2022-05-13 (×8): qty 1

## 2022-05-13 MED ORDER — FINASTERIDE 5 MG PO TABS
5.0000 mg | ORAL_TABLET | Freq: Every day | ORAL | Status: DC
  Administered 2022-05-13 – 2022-05-16 (×4): 5 mg via ORAL
  Filled 2022-05-13 (×4): qty 1

## 2022-05-13 MED ORDER — ONDANSETRON HCL 4 MG/2ML IJ SOLN: 4.0000 mg | Freq: Four times a day (QID) | INTRAMUSCULAR | Status: DC | PRN

## 2022-05-13 MED ORDER — LATANOPROST 0.005 % OP SOLN
1.0000 [drp] | Freq: Every day | OPHTHALMIC | Status: DC
  Administered 2022-05-15: 1 [drp] via OPHTHALMIC
  Filled 2022-05-13: qty 2.5

## 2022-05-13 MED ORDER — DIPHENHYDRAMINE HCL 50 MG/ML IJ SOLN: 12.5000 mg | Freq: Four times a day (QID) | INTRAMUSCULAR | Status: DC | PRN

## 2022-05-13 MED ORDER — INSULIN ASPART 100 UNIT/ML IJ SOLN
0.0000 [IU] | Freq: Every day | INTRAMUSCULAR | Status: DC
  Administered 2022-05-14 – 2022-05-15 (×2): 3 [IU] via SUBCUTANEOUS
  Filled 2022-05-13: qty 0.05

## 2022-05-13 MED ORDER — DOCUSATE SODIUM 100 MG PO CAPS
100.0000 mg | ORAL_CAPSULE | Freq: Two times a day (BID) | ORAL | Status: DC
  Administered 2022-05-13 – 2022-05-16 (×6): 100 mg via ORAL
  Filled 2022-05-13 (×6): qty 1

## 2022-05-13 MED ORDER — METHOCARBAMOL 500 MG PO TABS
500.0000 mg | ORAL_TABLET | Freq: Three times a day (TID) | ORAL | Status: DC | PRN
  Administered 2022-05-13: 500 mg via ORAL
  Filled 2022-05-13: qty 1

## 2022-05-13 MED ORDER — METOPROLOL TARTRATE 5 MG/5ML IV SOLN: 5.0000 mg | Freq: Four times a day (QID) | INTRAVENOUS | Status: DC | PRN

## 2022-05-13 MED ORDER — PHENYLEPHRINE HCL 1 % NA SOLN
1.0000 [drp] | Freq: Four times a day (QID) | NASAL | Status: DC | PRN
Start: 2022-05-13 — End: 2022-05-13

## 2022-05-13 MED ORDER — SIMVASTATIN 20 MG PO TABS
20.0000 mg | ORAL_TABLET | Freq: Every day | ORAL | Status: DC
  Administered 2022-05-13 – 2022-05-15 (×3): 20 mg via ORAL
  Filled 2022-05-13 (×3): qty 1

## 2022-05-13 MED ORDER — HYDROMORPHONE HCL 2 MG/ML IJ SOLN
1.0000 mg | Freq: Once | INTRAMUSCULAR | Status: AC
  Administered 2022-05-13: 1 mg via INTRAVENOUS
  Filled 2022-05-13: qty 1

## 2022-05-13 MED ORDER — ACETAMINOPHEN 650 MG RE SUPP: 650.0000 mg | Freq: Four times a day (QID) | RECTAL | Status: DC | PRN

## 2022-05-13 MED ORDER — METHOCARBAMOL 1000 MG/10ML IJ SOLN: 500.0000 mg | Freq: Three times a day (TID) | INTRAVENOUS | Status: DC | PRN

## 2022-05-13 MED ORDER — POLYETHYLENE GLYCOL 3350 17 G PO PACK: 17.0000 g | PACK | Freq: Every day | ORAL | Status: DC | PRN

## 2022-05-13 MED ORDER — HYDROMORPHONE HCL 2 MG/ML IJ SOLN
0.5000 mg | Freq: Once | INTRAMUSCULAR | Status: AC
  Administered 2022-05-13: 0.5 mg via INTRAVENOUS
  Filled 2022-05-13: qty 1

## 2022-05-13 MED ORDER — DIPHENHYDRAMINE HCL 12.5 MG/5ML PO ELIX: 12.5000 mg | ORAL_SOLUTION | Freq: Four times a day (QID) | ORAL | Status: DC | PRN

## 2022-05-13 MED ORDER — PANTOPRAZOLE SODIUM 40 MG IV SOLR: 40.0000 mg | Freq: Every day | INTRAVENOUS | Status: DC

## 2022-05-13 MED ORDER — SODIUM CHLORIDE 0.9 % IV SOLN: INTRAVENOUS | Status: DC

## 2022-05-13 MED ORDER — ONDANSETRON 4 MG PO TBDP
4.0000 mg | ORAL_TABLET | Freq: Once | ORAL | Status: AC
  Administered 2022-05-13: 4 mg via ORAL
  Filled 2022-05-13: qty 1

## 2022-05-13 MED ORDER — LINACLOTIDE 145 MCG PO CAPS
145.0000 ug | ORAL_CAPSULE | Freq: Every day | ORAL | Status: DC
  Administered 2022-05-15 – 2022-05-16 (×2): 145 ug via ORAL
  Filled 2022-05-13 (×3): qty 1

## 2022-05-13 MED ORDER — ENOXAPARIN SODIUM 40 MG/0.4ML IJ SOSY
40.0000 mg | PREFILLED_SYRINGE | INTRAMUSCULAR | Status: DC
  Administered 2022-05-13 – 2022-05-15 (×3): 40 mg via SUBCUTANEOUS
  Filled 2022-05-13 (×3): qty 0.4
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
## 2022-05-13 NOTE — ED Triage Notes (Signed)
Pt endorses RUQ pain and N/V that has been going for 10 weeks that has become progressively worse over the 3 days. Pt recently had a PET scan that did not show anything. Pt reports he is scheduled for radiation in a few weeks.

## 2022-05-13 NOTE — Anesthesia Preprocedure Evaluation (Signed)
Anesthesia Evaluation  Patient identified by MRN, date of birth, ID band Patient awake    Reviewed: Allergy & Precautions, NPO status , Patient's Chart, lab work & pertinent test results  Airway Mallampati: II  TM Distance: >3 FB Neck ROM: Full    Dental  (+) Dental Advisory Given   Pulmonary neg pulmonary ROS,    breath sounds clear to auscultation       Cardiovascular hypertension, Pt. on medications  Rhythm:Regular Rate:Normal     Neuro/Psych negative neurological ROS     GI/Hepatic Neg liver ROS, GERD  ,  Endo/Other  diabetes, Type 2, Oral Hypoglycemic Agents  Renal/GU Renal disease     Musculoskeletal   Abdominal   Peds  Hematology  (+) Blood dyscrasia, anemia ,   Anesthesia Other Findings   Reproductive/Obstetrics                            Lab Results  Component Value Date   WBC 11.5 (H) 05/13/2022   HGB 10.6 (L) 05/13/2022   HCT 34.2 (L) 05/13/2022   MCV 87.5 05/13/2022   PLT 451 (H) 05/13/2022   Lab Results  Component Value Date   CREATININE 1.09 05/13/2022   BUN 19 05/13/2022   NA 133 (L) 05/13/2022   K 4.3 05/13/2022   CL 100 05/13/2022   CO2 24 05/13/2022    Anesthesia Physical Anesthesia Plan  ASA: 2  Anesthesia Plan: General   Post-op Pain Management: Tylenol PO (pre-op)* and Gabapentin PO (pre-op)*   Induction: Intravenous  PONV Risk Score and Plan: 2 and Dexamethasone, Ondansetron and Treatment may vary due to age or medical condition  Airway Management Planned: Oral ETT  Additional Equipment:   Intra-op Plan:   Post-operative Plan: Extubation in OR  Informed Consent: I have reviewed the patients History and Physical, chart, labs and discussed the procedure including the risks, benefits and alternatives for the proposed anesthesia with the patient or authorized representative who has indicated his/her understanding and acceptance.     Dental  advisory given  Plan Discussed with: CRNA  Anesthesia Plan Comments:        Anesthesia Quick Evaluation

## 2022-05-13 NOTE — Plan of Care (Signed)
  Problem: Education: Goal: Knowledge of General Education information will improve Description: Including pain rating scale, medication(s)/side effects and non-pharmacologic comfort measures Outcome: Progressing   Problem: Activity: Goal: Risk for activity intolerance will decrease Outcome: Progressing   

## 2022-05-13 NOTE — ED Provider Notes (Signed)
[facility] DEPT Provider Note   CSN: [id number] Arrival date & time: 05/13/22  1034     History  Chief Complaint  Patient presents with   Abdominal Pain   Emesis    Hector Lopez is a 76 y.o. male with medical history of diabetes type 2, hypertension, hematuria, GERD, chronic back pain, cancer.  Patient presents to ED for evaluation of right upper quadrant pain.  Patient reports that for the last 1 month Hector Lopez has had constant and steady right upper quadrant pain described as burning and twisting.  The patient reports that Hector Lopez recently had a PET scan done in July which did show stones in his gallbladder without any evidence of gallbladder wall thickening.  The patient reports that Hector Lopez has been seen by general surgery, Dr. Ninfa Linden, who states that Hector Lopez wishes to take his gallbladder out.  The patient reports that Hector Lopez has been trying to take oxycodone at home which she has prescription of however it is not treating the pain, the patient states Hector Lopez has lost a significant amount of weight over the last 2 weeks.  Patient reports that Hector Lopez has been unable to sleep for the last 3 days.  Patient states that what caused him to come in this morning with the fact that Hector Lopez took ibuprofen and Tylenol and it did not help treat his pain at all.  The patient denies any fevers, diarrhea, chest pain, shortness of breath.  Patient does endorse nausea, vomiting, abdominal pain.   Abdominal Pain Associated symptoms: nausea and vomiting   Associated symptoms: no chest pain, no diarrhea, no fever and no shortness of breath   Emesis Associated symptoms: abdominal pain   Associated symptoms: no diarrhea and no fever        Home Medications Prior to Admission medications   Medication Sig Start Date End Date Taking? Authorizing Provider  acetaminophen (TYLENOL) 500 MG tablet Take 1,000 mg by mouth every 6 (six) hours as needed (pain).    [provider]  amLODipine (NORVASC) 10 MG  tablet Take 10 mg by mouth at bedtime.     [provider]  aspirin EC 81 MG tablet Take 81 mg by mouth every morning. Swallow whole.    [provider]  calcitonin, salmon, (MIACALCIN/FORTICAL) 200 UNIT/ACT nasal spray Place 1 spray into alternate nostrils daily.    [provider]  cetirizine (ZYRTEC) 10 MG tablet Take 10 mg by mouth daily as needed for allergies or rhinitis.    [provider]  cimetidine (TAGAMET) 200 MG tablet Take 200 mg by mouth daily as needed (for indigestion).    [provider]  finasteride (PROSCAR) 5 MG tablet Take 5 mg by mouth daily.    [provider]  gabapentin (NEURONTIN) 400 MG capsule Take 1 capsule (400 mg total) by mouth 3 (three) times daily. 04/29/22   Wyatt Portela, MD  glipiZIDE (GLUCOTROL) 10 MG tablet Take 10 mg by mouth daily.    [provider]  latanoprost (XALATAN) 0.005 % ophthalmic solution Place 1 drop into the left eye at bedtime.    [provider]  lidocaine-prilocaine (EMLA) cream Apply 1 application topically as needed. Patient not taking: Reported on 03/25/2022 06/20/20   Wyatt Portela, MD  linaclotide [facility]) 145 MCG CAPS capsule Take 1 capsule (145 mcg total) by mouth daily before breakfast. 04/05/22   Kathie Dike, MD  lisinopril (ZESTRIL) 40 MG tablet Take 40 mg by mouth at bedtime.  [provider]  megestrol (MEGACE) 400 MG/10ML suspension Take 10 mLs (400 mg total) by mouth daily. 05/08/22   Wyatt Portela, MD  metFORMIN (GLUCOPHAGE) 1000 MG tablet Take 1,000 mg by mouth in the morning and at bedtime.    [provider]  oxyCODONE (OXY IR/ROXICODONE) 5 MG immediate release tablet Take 1-2 tablets (5-10 mg total) by mouth every 6 (six) hours as needed for moderate pain. 04/04/22   Kathie Dike, MD  pantoprazole (PROTONIX) 40 MG tablet Take 1 tablet (40 mg total) by mouth 2 (two) times daily. 04/04/22   Kathie Dike, MD  phenylephrine  (NEO-SYNEPHRINE) 1 % nasal spray Place 1 drop into both nostrils every 6 (six) hours as needed for congestion.    [provider]  polyethylene glycol powder (GLYCOLAX/MIRALAX) 17 GM/SCOOP powder Take 17 g mixed in 4 to 8 ounces of liquid by mouth daily. 05/09/22   Wyatt Portela, MD  prochlorperazine (COMPAZINE) 10 MG tablet TAKE ONE TABLET BY MOUTH EVERY 6 HOURS AS NEEDED FOR NAUSEA AND FOR VOMITING 05/08/22   Wyatt Portela, MD  senna-docusate (SENOKOT-S) 8.6-50 MG tablet Take 1 tablet by mouth daily. 03/05/22   Wyvonnia Dusky, MD  sertraline (ZOLOFT) 50 MG tablet Take 50 mg by mouth daily at 12 noon.    [provider]  simvastatin (ZOCOR) 20 MG tablet Take 20 mg by mouth at bedtime.    [provider]  sodium phosphate (FLEET) 7-19 GM/118ML ENEM Place 133 mLs (1 enema total) rectally daily as needed for up to 1 dose for severe constipation. Patient not taking: Reported on 03/25/2022 03/05/22   Wyvonnia Dusky, MD  traMADol (ULTRAM) 50 MG tablet Take 1 tablet (50 mg total) by mouth every 6 (six) hours as needed. 04/30/22   Wyatt Portela, MD      Allergies    Codeine    Review of Systems   Review of Systems  Constitutional:  Negative for fever.  Respiratory:  Negative for shortness of breath.   Cardiovascular:  Negative for chest pain.  Gastrointestinal:  Positive for abdominal pain, nausea and vomiting. Negative for diarrhea.  All other systems reviewed and are negative.   Physical Exam Updated Vital Signs BP (!) 147/70   Pulse 97   Temp 98.3 F (36.8 C) (Oral)   Resp (!) 24   SpO2 99%  Physical Exam Vitals and nursing note reviewed.  Constitutional:      General: Hector Lopez is not in acute distress.    Appearance: Hector Lopez is well-developed. Hector Lopez is not ill-appearing, toxic-appearing or diaphoretic.  HENT:     Head: Normocephalic and atraumatic.     Nose: Nose normal. No congestion.     Mouth/Throat:     Mouth: Mucous membranes are moist.     Pharynx:  Oropharynx is clear.  Eyes:     Extraocular Movements: Extraocular movements intact.     Conjunctiva/sclera: Conjunctivae normal.     Pupils: Pupils are equal, round, and reactive to light.  Cardiovascular:     Rate and Rhythm: Normal rate and regular rhythm.  Pulmonary:     Effort: Pulmonary effort is normal.     Breath sounds: Normal breath sounds. No wheezing.  Abdominal:     General: Abdomen is flat. Bowel sounds are normal.     Palpations: Abdomen is soft.     Tenderness: There is abdominal tenderness in the right upper quadrant and right lower quadrant. There is no right CVA tenderness or  left CVA tenderness.  Musculoskeletal:     Cervical back: Normal range of motion and neck supple. No tenderness.  Skin:    General: Skin is warm and dry.     Capillary Refill: Capillary refill takes less than 2 seconds.  Neurological:     Mental Status: Hector Lopez is alert and oriented to person, place, and time.     ED Results / Procedures / Treatments   Labs (all labs ordered are listed, but only abnormal results are displayed) Labs Reviewed  COMPREHENSIVE METABOLIC PANEL - Abnormal; Notable for the following components:      Result Value   Sodium 133 (*)    Glucose, Bld 265 (*)    Albumin 3.0 (*)    AST 14 (*)    Alkaline Phosphatase 127 (*)    All other components within normal limits  CBC - Abnormal; Notable for the following components:   WBC 11.5 (*)    RBC 3.91 (*)    Hemoglobin 10.6 (*)    HCT 34.2 (*)    Platelets 451 (*)    All other components within normal limits  URINALYSIS, ROUTINE W REFLEX MICROSCOPIC - Abnormal; Notable for the following components:   Specific Gravity, Urine 1.032 (*)    Glucose, UA 50 (*)    Ketones, ur 5 (*)    Protein, ur 30 (*)    Leukocytes,Ua TRACE (*)    Bacteria, UA RARE (*)    All other components within normal limits  URINE CULTURE  LIPASE, BLOOD    EKG None  Radiology US Abdomen Limited RUQ (LIVER/GB)  Result Date:  05/13/2022 CLINICAL DATA:  Right upper quadrant pain EXAM: ULTRASOUND ABDOMEN LIMITED RIGHT UPPER QUADRANT COMPARISON:  Ultrasound abdomen 03/28/2022.  CT abdomen 03/12/2022 FINDINGS: Gallbladder: Multiple small gallstones. Gallbladder is distended measuring 10 x 6 cm. Negative sonographic Murphy sign. Gallbladder wall thickness 2.6 mm Common bile duct: Diameter: 6.4 mm Liver: Hyperechoic liver parenchyma without focal liver lesion. Portal vein is patent on color Doppler imaging with normal direction of blood flow towards the liver. Other: Right nephrectomy.  Mild ascites under the liver. IMPRESSION: Multiple small gallstones. Gallbladder is distended. Negative for biliary dilatation Hyperechoic liver suggesting chronic liver disease and or fatty liver. Small amount of ascites. Electronically Signed   By: Franchot Gallo M.D.   On: 05/13/2022 13:20    Procedures Procedures   Medications Ordered in ED Medications  enoxaparin (LOVENOX) injection 40 mg (has no administration in time range)  0.9 %  sodium chloride infusion (has no administration in time range)  cefTRIAXone (ROCEPHIN) 2 g in sodium chloride 0.9 % 100 mL IVPB (has no administration in time range)  metoprolol tartrate (LOPRESSOR) injection 5 mg (has no administration in time range)  pantoprazole (PROTONIX) injection 40 mg (has no administration in time range)  simethicone (MYLICON) chewable tablet 40 mg (has no administration in time range)  ondansetron (ZOFRAN-ODT) disintegrating tablet 4 mg (has no administration in time range)    Or  ondansetron (ZOFRAN) injection 4 mg (has no administration in time range)  prochlorperazine (COMPAZINE) tablet 10 mg (has no administration in time range)    Or  prochlorperazine (COMPAZINE) injection 5-10 mg (has no administration in time range)  polyethylene glycol (MIRALAX / GLYCOLAX) packet 17 g (has no administration in time range)  docusate sodium (COLACE) capsule 100 mg (has no administration in  time range)  diphenhydrAMINE (BENADRYL) 12.5 MG/5ML elixir 12.5 mg (has no administration in time range)  Or  diphenhydrAMINE (BENADRYL) injection 12.5 mg (has no administration in time range)  methocarbamol (ROBAXIN) tablet 500 mg (has no administration in time range)    Or  methocarbamol (ROBAXIN) 500 mg in dextrose 5 % 50 mL IVPB (has no administration in time range)  HYDROmorphone (DILAUDID) injection 1 mg (has no administration in time range)  oxyCODONE (Oxy IR/ROXICODONE) immediate release tablet 5-10 mg (has no administration in time range)  acetaminophen (TYLENOL) tablet 650 mg (has no administration in time range)    Or  acetaminophen (TYLENOL) suppository 650 mg (has no administration in time range)  insulin aspart (novoLOG) injection 0-15 Units (has no administration in time range)  insulin aspart (novoLOG) injection 0-5 Units (has no administration in time range)  amLODipine (NORVASC) tablet 10 mg (has no administration in time range)  finasteride (PROSCAR) tablet 5 mg (has no administration in time range)  gabapentin (NEURONTIN) capsule 400 mg (has no administration in time range)  latanoprost (XALATAN) 0.005 % ophthalmic solution 1 drop (has no administration in time range)  linaclotide (LINZESS) capsule 145 mcg (has no administration in time range)  lisinopril (ZESTRIL) tablet 40 mg (has no administration in time range)  megestrol (MEGACE) 400 MG/10ML suspension 400 mg (has no administration in time range)  pantoprazole (PROTONIX) EC tablet 40 mg (has no administration in time range)  phenylephrine (NEO-SYNEPHRINE) 1 % nasal drops 1 drop (has no administration in time range)  sertraline (ZOLOFT) tablet 50 mg (has no administration in time range)  simvastatin (ZOCOR) tablet 20 mg (has no administration in time range)  ondansetron (ZOFRAN-ODT) disintegrating tablet 4 mg (4 mg Oral Given 05/13/22 1134)  sodium chloride 0.9 % bolus 1,000 mL (0 mLs Intravenous Stopped 05/13/22  1441)  HYDROmorphone (DILAUDID) injection 1 mg (1 mg Intravenous Given 05/13/22 1241)  HYDROmorphone (DILAUDID) injection 0.5 mg (0.5 mg Intravenous Given 05/13/22 1444)    ED Course/ Medical Decision Making/ A&P                           Medical Decision Making Amount and/or Complexity of Data Reviewed Labs: ordered. Radiology: ordered.  Risk Prescription drug management.   84-year male presents to ED for evaluation.  Please see HPI for further details.  On examination patient is afebrile and nontachycardic.  Patient lung sounds clear bilaterally, not hypoxic on room air.  Patient abdomen soft and compressible however patient does have tenderness to his right lower quadrant, right upper quadrant.  The patient does not have any overlying skin change to his abdomen.  There is no CVA tenderness bilaterally.  Patient worked up utilizing the following labs and imaging studies interpreted by me personally: - CBC with leukocytosis 11.5 - CMP with decreased sodium to 133, elevated glucose of 265 - Urinalysis shows increased specific gravity, ketones, protein, trace leukocytes.  I will culture this patient's urine - Ultrasound right upper quadrant shows distended gallbladder  General surgery was consulted due to symptomatic cholelithiasis.  Brooke, PA-C, stated that she would be down to see the patient.  Update: Hunt Oris has agreed to admit the patient for laparoscopic cholecystectomy tomorrow.  The patient is amenable to the plan.  The patient is stable at this time for admission.  Final Clinical Impression(s) / ED Diagnoses Final diagnoses:  RUQ pain  Calculus of gallbladder without cholecystitis without obstruction    Rx / DC Orders ED Discharge Orders     None  Azucena Cecil, PA-C 05/13/22 1535    Kommor, Ovid, MD 05/14/22 1021

## 2022-05-13 NOTE — Telephone Encounter (Signed)
Mr Broady states he is in severe pain, cannot eat, has not slept in 3 nights, can't stand more than a couple of minutes and vomited X 4 this morning. " I just can't take this anymore". He has been taking Oxycodone for pain with no relief. Has appt with Radiation on 9/5 for evaluation.   Says he is going to [facility] ED now.

## 2022-05-14 ENCOUNTER — Observation Stay (HOSPITAL_COMMUNITY): Payer: Medicare Other

## 2022-05-14 ENCOUNTER — Observation Stay (HOSPITAL_COMMUNITY): Payer: Medicare Other | Admitting: Anesthesiology

## 2022-05-14 ENCOUNTER — Encounter (HOSPITAL_COMMUNITY): Payer: Self-pay

## 2022-05-14 ENCOUNTER — Other Ambulatory Visit: Payer: Self-pay

## 2022-05-14 ENCOUNTER — Observation Stay (HOSPITAL_BASED_OUTPATIENT_CLINIC_OR_DEPARTMENT_OTHER): Payer: Medicare Other | Admitting: Anesthesiology

## 2022-05-14 ENCOUNTER — Encounter (HOSPITAL_COMMUNITY): Admission: EM | Disposition: A | Discharge status: Home / Self Care | Payer: Self-pay | Source: Home / Self Care

## 2022-05-14 DIAGNOSIS — K8020 Calculus of gallbladder without cholecystitis without obstruction: Secondary | ICD-10-CM | POA: Diagnosis not present

## 2022-05-14 DIAGNOSIS — K8018 Calculus of gallbladder with other cholecystitis without obstruction: Secondary | ICD-10-CM | POA: Diagnosis not present

## 2022-05-14 DIAGNOSIS — K8010 Calculus of gallbladder with chronic cholecystitis without obstruction: Secondary | ICD-10-CM | POA: Diagnosis not present

## 2022-05-14 HISTORY — PX: CHOLECYSTECTOMY: SHX55

## 2022-05-14 LAB — GLUCOSE, CAPILLARY
Glucose-Capillary: 188 mg/dL — ABNORMAL HIGH (ref 70–99)
Glucose-Capillary: 210 mg/dL — ABNORMAL HIGH (ref 70–99)
Glucose-Capillary: 248 mg/dL — ABNORMAL HIGH (ref 70–99)
Glucose-Capillary: 348 mg/dL — ABNORMAL HIGH (ref 70–99)
Glucose-Capillary: 297 mg/dL — ABNORMAL HIGH (ref 70–99)

## 2022-05-14 LAB — CBC
HCT: 30.3 % — ABNORMAL LOW (ref 39.0–52.0)
Hemoglobin: 9.3 g/dL — ABNORMAL LOW (ref 13.0–17.0)
MCH: 27 pg (ref 26.0–34.0)
MCHC: 30.7 g/dL (ref 30.0–36.0)
MCV: 87.8 fL (ref 80.0–100.0)
Platelets: 341 10*3/uL (ref 150–400)
RBC: 3.45 MIL/uL — ABNORMAL LOW (ref 4.22–5.81)
RDW: 14.4 % (ref 11.5–15.5)
WBC: 9.1 10*3/uL (ref 4.0–10.5)
nRBC: 0 % (ref 0.0–0.2)

## 2022-05-14 LAB — COMPREHENSIVE METABOLIC PANEL
ALT: 9 U/L (ref 0–44)
AST: 8 U/L — ABNORMAL LOW (ref 15–41)
Albumin: 2.4 g/dL — ABNORMAL LOW (ref 3.5–5.0)
Alkaline Phosphatase: 102 U/L (ref 38–126)
Anion gap: 5 (ref 5–15)
BUN: 13 mg/dL (ref 8–23)
CO2: 23 mmol/L (ref 22–32)
Calcium: 8.5 mg/dL — ABNORMAL LOW (ref 8.9–10.3)
Chloride: 105 mmol/L (ref 98–111)
Creatinine, Ser: 0.88 mg/dL (ref 0.61–1.24)
GFR, Estimated: >60 mL/min (ref >60)
Glucose, Bld: 192 mg/dL — ABNORMAL HIGH (ref 70–99)
Potassium: 4.3 mmol/L (ref 3.5–5.1)
Sodium: 133 mmol/L — ABNORMAL LOW (ref 135–145)
Total Bilirubin: 0.6 mg/dL (ref 0.3–1.2)
Total Protein: 6.2 g/dL — ABNORMAL LOW (ref 6.5–8.1)

## 2022-05-14 LAB — URINE CULTURE: Culture: <10000 — AB

## 2022-05-14 SURGERY — LAPAROSCOPIC CHOLECYSTECTOMY WITH INTRAOPERATIVE CHOLANGIOGRAM
Anesthesia: General

## 2022-05-14 MED ORDER — ONDANSETRON HCL 4 MG/2ML IJ SOLN
INTRAMUSCULAR | Status: DC | PRN
  Administered 2022-05-14: 4 mg via INTRAVENOUS

## 2022-05-14 MED ORDER — FENTANYL CITRATE (PF) 100 MCG/2ML IJ SOLN
INTRAMUSCULAR | Status: DC | PRN
  Administered 2022-05-14: 100 ug via INTRAVENOUS
  Administered 2022-05-14: 50 ug via INTRAVENOUS

## 2022-05-14 MED ORDER — PROPOFOL 10 MG/ML IV BOLUS
INTRAVENOUS | Status: AC
  Filled 2022-05-14: qty 20

## 2022-05-14 MED ORDER — FENTANYL CITRATE PF 50 MCG/ML IJ SOSY
PREFILLED_SYRINGE | INTRAMUSCULAR | Status: AC
  Administered 2022-05-14: 50 ug via INTRAVENOUS
  Filled 2022-05-14: qty 2

## 2022-05-14 MED ORDER — AMISULPRIDE (ANTIEMETIC) 5 MG/2ML IV SOLN: 10.0000 mg | Freq: Once | INTRAVENOUS | Status: DC | PRN

## 2022-05-14 MED ORDER — LIDOCAINE HCL (PF) 2 % IJ SOLN
INTRAMUSCULAR | Status: AC
  Filled 2022-05-14: qty 5

## 2022-05-14 MED ORDER — PROPOFOL 10 MG/ML IV BOLUS
INTRAVENOUS | Status: DC | PRN
  Administered 2022-05-14: 150 mg via INTRAVENOUS

## 2022-05-14 MED ORDER — FENTANYL CITRATE (PF) 250 MCG/5ML IJ SOLN
INTRAMUSCULAR | Status: AC
  Filled 2022-05-14: qty 5

## 2022-05-14 MED ORDER — CHLORHEXIDINE GLUCONATE CLOTH 2 % EX PADS
6.0000 | MEDICATED_PAD | Freq: Every day | CUTANEOUS | Status: DC
  Administered 2022-05-14 – 2022-05-15 (×2): 6 via TOPICAL

## 2022-05-14 MED ORDER — ROCURONIUM BROMIDE 10 MG/ML (PF) SYRINGE
PREFILLED_SYRINGE | INTRAVENOUS | Status: DC | PRN
  Administered 2022-05-14: 50 mg via INTRAVENOUS
  Administered 2022-05-14: 10 mg via INTRAVENOUS

## 2022-05-14 MED ORDER — DEXAMETHASONE SODIUM PHOSPHATE 10 MG/ML IJ SOLN
INTRAMUSCULAR | Status: DC | PRN
  Administered 2022-05-14: 10 mg via INTRAVENOUS

## 2022-05-14 MED ORDER — FENTANYL CITRATE PF 50 MCG/ML IJ SOSY
25.0000 ug | PREFILLED_SYRINGE | INTRAMUSCULAR | Status: DC | PRN
  Administered 2022-05-14: 50 ug via INTRAVENOUS

## 2022-05-14 MED ORDER — MUPIROCIN 2 % EX OINT
1.0000 | TOPICAL_OINTMENT | Freq: Two times a day (BID) | CUTANEOUS | Status: DC
  Administered 2022-05-14 – 2022-05-16 (×5): 1 via NASAL
  Filled 2022-05-14: qty 22

## 2022-05-14 MED ORDER — SODIUM CHLORIDE 0.9 % IV SOLN
INTRAVENOUS | Status: DC | PRN
  Administered 2022-05-14: 50 mL

## 2022-05-14 MED ORDER — 0.9 % SODIUM CHLORIDE (POUR BTL) OPTIME
TOPICAL | Status: DC | PRN
  Administered 2022-05-14: 1000 mL

## 2022-05-14 MED ORDER — ROCURONIUM BROMIDE 10 MG/ML (PF) SYRINGE
PREFILLED_SYRINGE | INTRAVENOUS | Status: AC
  Filled 2022-05-14: qty 10

## 2022-05-14 MED ORDER — SUGAMMADEX SODIUM 200 MG/2ML IV SOLN
INTRAVENOUS | Status: DC | PRN
  Administered 2022-05-14: 200 mg via INTRAVENOUS

## 2022-05-14 MED ORDER — LIDOCAINE 2% (20 MG/ML) 5 ML SYRINGE
INTRAMUSCULAR | Status: DC | PRN
  Administered 2022-05-14: 60 mg via INTRAVENOUS

## 2022-05-14 MED ORDER — LACTATED RINGERS IV SOLN: INTRAVENOUS | Status: DC

## 2022-05-14 MED ORDER — DEXAMETHASONE SODIUM PHOSPHATE 10 MG/ML IJ SOLN
INTRAMUSCULAR | Status: AC
  Filled 2022-05-14: qty 1

## 2022-05-14 MED ORDER — BUPIVACAINE-EPINEPHRINE 0.25% -1:200000 IJ SOLN
INTRAMUSCULAR | Status: DC | PRN
  Administered 2022-05-14: 14 mL

## 2022-05-14 MED ORDER — ONDANSETRON HCL 4 MG/2ML IJ SOLN
INTRAMUSCULAR | Status: AC
  Filled 2022-05-14: qty 2

## 2022-05-14 MED ORDER — VASOPRESSIN 20 UNIT/ML IV SOLN
INTRAVENOUS | Status: AC
  Filled 2022-05-14: qty 1

## 2022-05-14 MED ORDER — BUPIVACAINE-EPINEPHRINE (PF) 0.25% -1:200000 IJ SOLN
INTRAMUSCULAR | Status: AC
  Filled 2022-05-14: qty 30

## 2022-05-14 MED ORDER — LACTATED RINGERS IR SOLN
Status: DC | PRN
  Administered 2022-05-14: 1000 mL

## 2022-05-14 SURGICAL SUPPLY — 41 items
APPLIER CLIP ROT 10 11.4 M/L (STAPLE) ×1
BENZOIN TINCTURE PRP APPL 2/3 (GAUZE/BANDAGES/DRESSINGS) ×1 IMPLANT
CABLE HIGH FREQUENCY MONO STRZ (ELECTRODE) ×1 IMPLANT
CHLORAPREP W/TINT 26 (MISCELLANEOUS) ×1 IMPLANT
CLIP APPLIE ROT 10 11.4 M/L (STAPLE) ×1 IMPLANT
COVER MAYO STAND XLG (MISCELLANEOUS) ×1 IMPLANT
COVER SURGICAL LIGHT HANDLE (MISCELLANEOUS) ×1 IMPLANT
DRAPE C-ARM 42X120 X-RAY (DRAPES) ×1 IMPLANT
DRSG TEGADERM 2-3/8X2-3/4 SM (GAUZE/BANDAGES/DRESSINGS) ×3 IMPLANT
DRSG TEGADERM 4X4.75 (GAUZE/BANDAGES/DRESSINGS) ×1 IMPLANT
ELECT PENCIL ROCKER SW 15FT (MISCELLANEOUS) IMPLANT
ELECT REM PT RETURN 15FT ADLT (MISCELLANEOUS) ×1 IMPLANT
GAUZE SPONGE 2X2 8PLY STRL LF (GAUZE/BANDAGES/DRESSINGS) IMPLANT
GLOVE BIO SURGEON STRL SZ7 (GLOVE) ×1 IMPLANT
GLOVE BIOGEL PI IND STRL 7.5 (GLOVE) ×1 IMPLANT
GLOVE BIOGEL PI INDICATOR 7.5 (GLOVE) ×1
GOWN STRL REUS W/ TWL LRG LVL3 (GOWN DISPOSABLE) ×1 IMPLANT
GOWN STRL REUS W/TWL LRG LVL3 (GOWN DISPOSABLE) ×1
IRRIG SUCT STRYKERFLOW 2 WTIP (MISCELLANEOUS) ×1
IRRIGATION SUCT STRKRFLW 2 WTP (MISCELLANEOUS) ×1 IMPLANT
KIT BASIN OR (CUSTOM PROCEDURE TRAY) ×1 IMPLANT
KIT TURNOVER KIT A (KITS) IMPLANT
NS IRRIG 1000ML POUR BTL (IV SOLUTION) ×1 IMPLANT
POUCH RETRIEVAL ECOSAC 10 (ENDOMECHANICALS) IMPLANT
POUCH RETRIEVAL ECOSAC 10MM (ENDOMECHANICALS) ×1
SCISSORS LAP 5X35 DISP (ENDOMECHANICALS) ×1 IMPLANT
SET CHOLANGIOGRAPH MIX (MISCELLANEOUS) ×1 IMPLANT
SET TUBE SMOKE EVAC HIGH FLOW (TUBING) ×1 IMPLANT
SLEEVE Z-THREAD 5X100MM (TROCAR) ×1 IMPLANT
SPIKE FLUID TRANSFER (MISCELLANEOUS) ×1 IMPLANT
STRIP CLOSURE SKIN 1/2X4 (GAUZE/BANDAGES/DRESSINGS) ×1 IMPLANT
SUT MNCRL AB 4-0 PS2 18 (SUTURE) ×1 IMPLANT
SYS BAG RETRIEVAL 10MM (BASKET)
SYSTEM BAG RETRIEVAL 10MM (BASKET) IMPLANT
TOWEL OR 17X26 10 PK STRL BLUE (TOWEL DISPOSABLE) ×1 IMPLANT
TOWEL OR NON WOVEN STRL DISP B (DISPOSABLE) ×1 IMPLANT
TRAY LAPAROSCOPIC (CUSTOM PROCEDURE TRAY) ×1 IMPLANT
TROCAR 11X100 Z THREAD (TROCAR) ×1 IMPLANT
TROCAR BALLN 12MMX100 BLUNT (TROCAR) ×1 IMPLANT
TROCAR XCEL NON-BLD 5MMX100MML (ENDOMECHANICALS) IMPLANT
TROCAR Z-THREAD OPTICAL 5X100M (TROCAR) ×1 IMPLANT

## 2022-05-14 NOTE — Anesthesia Postprocedure Evaluation (Signed)
Anesthesia Post Note  Patient: Hector Lopez  Procedure(s) Performed: LAPAROSCOPIC CHOLECYSTECTOMY WITH INTRAOPERATIVE CHOLANGIOGRAM     Patient location during evaluation: PACU Anesthesia Type: General Level of consciousness: awake and alert Pain management: pain level controlled Vital Signs Assessment: post-procedure vital signs reviewed and stable Respiratory status: spontaneous breathing, nonlabored ventilation, respiratory function stable and patient connected to nasal cannula oxygen Cardiovascular status: blood pressure returned to baseline and stable Postop Assessment: no apparent nausea or vomiting Anesthetic complications: no   No notable events documented.  Last Vitals:  Vitals:   05/14/22 0915 05/14/22 0918  BP: (!) 155/70   Pulse: 98 100  Resp: 14 18  Temp: 37.1 C   SpO2: 97% 96%    Last Pain:  Vitals:   05/14/22 0918  TempSrc:   PainSc: 6                  Tiajuana Amass

## 2022-05-14 NOTE — Interval H&P Note (Signed)
History and Physical Interval Note:  05/14/2022 7:01 AM  Hector Lopez  has presented today for surgery, with the diagnosis of SYMPTOMATIC CHOLELITHIASIS.  The various methods of treatment have been discussed with the patient and family. After consideration of risks, benefits and other options for treatment, the patient has consented to  Procedure(s): LAPAROSCOPIC CHOLECYSTECTOMY WITH INTRAOPERATIVE CHOLANGIOGRAM (N/A) as a surgical intervention.  The patient's history has been reviewed, patient examined, no change in status, stable for surgery.  I have reviewed the patient's chart and labs.  Questions were answered to the patient's satisfaction.     Hector Lopez

## 2022-05-14 NOTE — Discharge Instructions (Addendum)
[facility]  Please arrive at least 30 min before your appointment to complete your check in paperwork.  If you are unable to arrive 30 min prior to your appointment time we may have to cancel or reschedule you. LAPAROSCOPIC SURGERY: POST OP INSTRUCTIONS Always review your discharge instruction sheet given to you by the facility where your surgery was performed. IF YOU HAVE DISABILITY OR FAMILY LEAVE FORMS, YOU MUST BRING THEM TO THE OFFICE FOR PROCESSING.   DO NOT GIVE THEM TO YOUR DOCTOR.  PAIN CONTROL  First take acetaminophen (Tylenol) AND/or ibuprofen (Advil) to control your pain after surgery.  Follow directions on package.  Taking acetaminophen (Tylenol) and/or ibuprofen (Advil) regularly after surgery will help to control your pain and lower the amount of prescription pain medication you may need.  You should not take more than 4,000 mg (4 grams) of acetaminophen (Tylenol) in 24 hours.  You should not take ibuprofen (Advil), aleve, motrin, naprosyn or other NSAIDS if you have a history of stomach ulcers or chronic kidney disease.  A prescription for pain medication may be given to you upon discharge.  Take your pain medication as prescribed, if you still have uncontrolled pain after taking acetaminophen (Tylenol) or ibuprofen (Advil). Use ice packs to help control pain. If you need a refill on your pain medication, please contact your pharmacy.  They will contact our office to request authorization. Prescriptions will not be filled after 5pm or on week-ends.  HOME MEDICATIONS Take your usually prescribed medications unless otherwise directed.  DIET You should follow a light diet the first few days after arrival home.  Be sure to include lots of fluids daily. Avoid fatty, fried foods.   CONSTIPATION It is common to experience some constipation after surgery and if you are taking pain medication.  Increasing fluid intake and taking a stool softener (such as Colace)  will usually help or prevent this problem from occurring.  A mild laxative (Milk of Magnesia or Miralax) should be taken according to package instructions if there are no bowel movements after 48 hours.  WOUND/INCISION CARE Most patients will experience some swelling and bruising in the area of the incisions.  Ice packs will help.  Swelling and bruising can take several days to resolve.  Unless discharge instructions indicate otherwise, follow guidelines below  STERI-STRIPS - you may remove your outer bandages 48 hours after surgery, and you may shower at that time.  You have steri-strips (small skin tapes) in place directly over the incision.  These strips should be left on the skin for 7-10 days.   DERMABOND/SKIN GLUE - you may shower in 24 hours.  The glue will flake off over the next 2-3 weeks. Any sutures or staples will be removed at the office during your follow-up visit.  ACTIVITIES You may resume regular (light) daily activities beginning the next day--such as daily self-care, walking, climbing stairs--gradually increasing activities as tolerated.  You may have sexual intercourse when it is comfortable.  Refrain from any heavy lifting or straining until approved by your doctor. You may drive when you are no longer taking prescription pain medication, you can comfortably wear a seatbelt, and you can safely maneuver your car and apply brakes.  FOLLOW-UP You should see your doctor in the office for a follow-up appointment approximately 2-3 weeks after your surgery.  You should have been given your post-op/follow-up appointment when your surgery was scheduled.  If you did not receive a post-op/follow-up appointment, make sure   that you call for this appointment within a day or two after you arrive home to insure a convenient appointment time.  OTHER INSTRUCTIONS  WHEN TO CALL YOUR DOCTOR: Fever over 101.0 Inability to urinate Continued bleeding from incision. Increased pain, redness, or  drainage from the incision. Increasing abdominal pain  The clinic staff is available to answer your questions during regular business hours.  Please don't hesitate to call and ask to speak to one of the nurses for clinical concerns.  If you have a medical emergency, go to the nearest emergency room or call 911.  A surgeon from [facility] is always on call at the hospital. [address] [place], [place], [place]  [address] ? [address], [place], [place]   [address] [phone] ? [phone] ? FAX [phone]    Follow with [name], [name], MD in 5-7 days  Please get a complete blood count and chemistry panel checked by your Primary MD at your next visit, and again as instructed by your Primary MD. Please get your medications reviewed and adjusted by your Primary MD.  Please request your Primary MD to go over all Hospital Tests and Procedure/Radiological results at the follow up, please get all Hospital records sent to your Prim MD by signing hospital release before you go home.  In some cases, there will be blood work, cultures and biopsy results pending at the time of your discharge. Please request that your primary care M.D. goes through all the records of your hospital data and follows up on these results.  If you had Pneumonia of Lung problems at the Hospital: Please get a 2 view Chest X ray done in 6-8 weeks after hospital discharge or sooner if instructed by your Primary MD.  If you have Congestive Heart Failure: Please call your Cardiologist or Primary MD anytime you have any of the following symptoms:  1) 3 pound weight gain in 24 hours or 5 pounds in 1 week  2) shortness of breath, with or without a dry hacking cough  3) swelling in the hands, feet or stomach  4) if you have to sleep on extra pillows at night in order to breathe  Follow cardiac low salt diet and 1.5 lit/day fluid restriction.  If you have diabetes Accuchecks 4 times/day, Once in AM  empty stomach and then before each meal. Log in all results and show them to your primary doctor at your next visit. If any glucose reading is under 80 or above 300 call your primary MD immediately.  If you have Seizure/Convulsions/Epilepsy: Please do not drive, operate heavy machinery, participate in activities at heights or participate in high speed sports until you have seen by Primary MD or a Neurologist and advised to do so again. Per [facility] statutes, patients with seizures are not allowed to drive until they have been seizure-free for six months.  Use caution when using heavy equipment or power tools. Avoid working on ladders or at heights. Take showers instead of baths. Ensure the water temperature is not too high on the home water heater. Do not go swimming alone. Do not lock yourself in a room alone (i.e. bathroom). When caring for infants or small children, sit down when holding, feeding, or changing them to minimize risk of injury to the child in the event you have a seizure. Maintain good sleep hygiene. Avoid alcohol.   If you had Gastrointestinal Bleeding: Please ask your Primary MD to check a complete  blood count within one week of discharge or at your next visit. Your endoscopic/colonoscopic biopsies that are pending at the time of discharge, will also need to followed by your Primary MD.  Get Medicines reviewed and adjusted. Please take all your medications with you for your next visit with your Primary MD  Please request your Primary MD to go over all hospital tests and procedure/radiological results at the follow up, please ask your Primary MD to get all Hospital records sent to his/her office.  If you experience worsening of your admission symptoms, develop shortness of breath, life threatening emergency, suicidal or homicidal thoughts you must seek medical attention immediately by calling 911 or calling your MD immediately  if symptoms less severe.  You must read  complete instructions/literature along with all the possible adverse reactions/side effects for all the Medicines you take and that have been prescribed to you. Take any new Medicines after you have completely understood and accpet all the possible adverse reactions/side effects.   Do not drive or operate heavy machinery when taking Pain medications.   Do not take more than prescribed Pain, Sleep and Anxiety Medications  Special Instructions: If you have smoked or chewed Tobacco  in the last 2 yrs please stop smoking, stop any regular Alcohol  and or any Recreational drug use.  Wear Seat belts while driving.

## 2022-05-14 NOTE — Op Note (Signed)
Laparoscopic Cholecystectomy Procedure Note  Indications: Hector Lopez is a 76 y.o. 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 He was admitted to the hospital about 6 weeks ago with similar symptoms. States that this all started after undergoing immunotherapy. His symptoms are slightly different now than during last admission. He does have some epigastric discomfort. The pain is constant. Nothing makes it better or worse. PO intake does not make the pain worse but he does not have much of an appetite. Denies nausea, vomiting, fever, chills. Work up in the ED today pertinent for WBC 11.5, elevated alk phos 127, u/s with multiple small gallstones, a distended gallbladder with mild wall thickening 2.11m.   Pre-operative Diagnosis: Calculus of gallbladder with other cholecystitis, without mention of obstruction  Post-operative Diagnosis: Same  Surgeon: MMaia Petties  Assistants: none  Anesthesia: General endotracheal anesthesia  ASA Class: 2  Procedure Details  The patient was seen again in the Holding Room. The risks, benefits, complications, treatment options, and expected outcomes were discussed with the patient. The possibilities of reaction to medication, pulmonary aspiration, perforation of viscus, bleeding, recurrent infection, finding a normal gallbladder, the need for additional procedures, failure to diagnose a condition, the possible need to convert to an open procedure, and creating a complication requiring transfusion or operation were discussed with the patient. The likelihood of improving the patient's symptoms with return to their baseline status is good.  The patient and/or family concurred with the proposed plan, giving informed consent. The site of surgery properly noted. The patient was taken to Operating Room, identified as Hector Delcarloand the procedure verified as Laparoscopic Cholecystectomy with Intraoperative Cholangiogram. A Time Out was held and the above information confirmed.  Prior to the induction of general anesthesia, antibiotic prophylaxis was administered. General endotracheal anesthesia was then administered and tolerated well. After the induction, the abdomen was prepped with Chloraprep and draped in sterile fashion. The patient was positioned in the supine position. The patient has a previous midline laparotomy incision.  Therefore I made the decision to enter with Optiview technique.  I made a 5 mm incision in the left upper quadrant.  The Ethicon 5 mm Optiview trocar was used to cannulate the peritoneal cavity under direct vision.  Pneumoperitoneum was then created with CO2 and tolerated well without any adverse changes in the patient's vital signs.  The patient has no significant adhesions in the midline.  He does have a 2 cm epigastric ventral hernia defect in the midline.  No bowel or omentum is incarcerated in the hernia. Local anesthetic agent was injected into the skin near the umbilicus and an incision made. We dissected down to the abdominal fascia with blunt dissection.  The fascia was incised vertically and we entered the peritoneal cavity bluntly.  A pursestring suture of 0-Vicryl was placed around the fascial opening.  The Hasson cannula was inserted and secured with the stay suture.   An 11-mm port was placed in the subxiphoid position.  Two 5-mm ports were placed in the right upper quadrant. All skin incisions were infiltrated with a local anesthetic agent before making the incision and placing the trocars.   We positioned the patient in reverse Trendelenburg, tilted slightly to the patient's left.  The gallbladder was identified, the fundus grasped and retracted cephalad.  The gallbladder was quite distended but not thickened or inflamed.  There were  significant omental adhesions to the surface of the  gallbladder.  Adhesions were lysed bluntly and with the electrocautery where indicated, taking care not to injure any adjacent organs or viscus. The infundibulum was grasped and retracted laterally, exposing the peritoneum overlying the triangle of Calot. This was then divided and exposed in a blunt fashion. The cystic duct was clearly identified and bluntly dissected circumferentially. A critical view of the cystic duct and cystic artery was obtained.  The cystic duct was too small in diameter to perform a cholangiogram.  The cystic duct was then ligated with clips and divided. The cystic artery was dissected free, ligated with clips and divided as well.   The gallbladder was dissected from the liver bed in retrograde fashion with the electrocautery. The gallbladder was removed and placed in an Endocatch sac. The liver bed was irrigated and inspected. Hemostasis was achieved with the electrocautery. Copious irrigation was utilized and was repeatedly aspirated until clear.  The gallbladder and Endocatch sac were then removed through the umbilical port site.  The pursestring suture was used to close the umbilical fascia.    We again inspected the right upper quadrant for hemostasis.  Pneumoperitoneum was released as we removed the trocars.  4-0 Monocryl was used to close the skin.   Benzoin, steri-strips, and clean dressings were applied. The patient was then extubated and brought to the recovery room in stable condition. Instrument, sponge, and needle counts were correct at closure and at the conclusion of the case.   Findings: Cholecystitis with Cholelithiasis  Estimated Blood Loss: less than 50 mL         Drains: none         Specimens: Gallbladder           Complications: None; patient tolerated the procedure well.         Disposition: PACU - hemodynamically stable.         Condition: stable  Imogene Burn. Georgette Dover, MD, [facility]  General Surgery   05/14/2022 8:59  AM

## 2022-05-14 NOTE — Transfer of Care (Signed)
Immediate Anesthesia Transfer of Care Note  Patient: Hector Lopez  Procedure(s) Performed: LAPAROSCOPIC CHOLECYSTECTOMY WITH INTRAOPERATIVE CHOLANGIOGRAM  Patient Location: PACU  Anesthesia Type:General  Level of Consciousness: awake, alert  and oriented  Airway & Oxygen Therapy: Patient Spontanous Breathing and Patient connected to face mask oxygen  Post-op Assessment: Report given to RN and Post -op Vital signs reviewed and stable  Post vital signs: Reviewed and stable  Last Vitals:  Vitals Value Taken Time  BP 166/73 05/14/22 0857  Temp    Pulse 100 05/14/22 0858  Resp 13 05/14/22 0858  SpO2 94 % 05/14/22 0858  Vitals shown include unvalidated device data.  Last Pain:  Vitals:   05/14/22 0633  TempSrc:   PainSc: 4       Patients Stated Pain Goal: 2 (03/00/92 3300)  Complications: No notable events documented.

## 2022-05-14 NOTE — Anesthesia Procedure Notes (Signed)
Procedure Name: Intubation Date/Time: 05/14/2022 7:32 AM  Performed by: Maxwell Caul, CRNAPre-anesthesia Checklist: Patient identified, Emergency Drugs available, Suction available and Patient being monitored Patient Re-evaluated:Patient Re-evaluated prior to induction Oxygen Delivery Method: Circle system utilized Preoxygenation: Pre-oxygenation with 100% oxygen Induction Type: IV induction Ventilation: Mask ventilation without difficulty Laryngoscope Size: Mac and 4 Grade View: Grade I Tube type: Oral Tube size: 7.5 mm Number of attempts: 1 Airway Equipment and Method: Stylet Placement Confirmation: ETT inserted through vocal cords under direct vision, positive ETCO2 and breath sounds checked- equal and bilateral Secured at: 21 cm Tube secured with: Tape Dental Injury: Teeth and Oropharynx as per pre-operative assessment

## 2022-05-15 ENCOUNTER — Encounter (HOSPITAL_COMMUNITY): Payer: Self-pay | Admitting: Surgery

## 2022-05-15 DIAGNOSIS — G8929 Other chronic pain: Secondary | ICD-10-CM | POA: Diagnosis present

## 2022-05-15 DIAGNOSIS — Z7984 Long term (current) use of oral hypoglycemic drugs: Secondary | ICD-10-CM | POA: Diagnosis not present

## 2022-05-15 DIAGNOSIS — E782 Mixed hyperlipidemia: Secondary | ICD-10-CM | POA: Diagnosis present

## 2022-05-15 DIAGNOSIS — Z905 Acquired absence of kidney: Secondary | ICD-10-CM | POA: Diagnosis not present

## 2022-05-15 DIAGNOSIS — C689 Malignant neoplasm of urinary organ, unspecified: Secondary | ICD-10-CM | POA: Diagnosis present

## 2022-05-15 DIAGNOSIS — E119 Type 2 diabetes mellitus without complications: Secondary | ICD-10-CM | POA: Diagnosis not present

## 2022-05-15 DIAGNOSIS — Z8554 Personal history of malignant neoplasm of ureter: Secondary | ICD-10-CM | POA: Diagnosis not present

## 2022-05-15 DIAGNOSIS — I10 Essential (primary) hypertension: Secondary | ICD-10-CM | POA: Diagnosis present

## 2022-05-15 DIAGNOSIS — Z885 Allergy status to narcotic agent status: Secondary | ICD-10-CM | POA: Diagnosis not present

## 2022-05-15 DIAGNOSIS — R1011 Right upper quadrant pain: Secondary | ICD-10-CM | POA: Diagnosis present

## 2022-05-15 DIAGNOSIS — K219 Gastro-esophageal reflux disease without esophagitis: Secondary | ICD-10-CM | POA: Diagnosis present

## 2022-05-15 DIAGNOSIS — Z79899 Other long term (current) drug therapy: Secondary | ICD-10-CM | POA: Diagnosis not present

## 2022-05-15 DIAGNOSIS — M4854XA Collapsed vertebra, not elsewhere classified, thoracic region, initial encounter for fracture: Secondary | ICD-10-CM | POA: Diagnosis present

## 2022-05-15 DIAGNOSIS — K8020 Calculus of gallbladder without cholecystitis without obstruction: Secondary | ICD-10-CM | POA: Diagnosis present

## 2022-05-15 DIAGNOSIS — E785 Hyperlipidemia, unspecified: Secondary | ICD-10-CM

## 2022-05-15 DIAGNOSIS — K439 Ventral hernia without obstruction or gangrene: Secondary | ICD-10-CM | POA: Diagnosis present

## 2022-05-15 DIAGNOSIS — Z7982 Long term (current) use of aspirin: Secondary | ICD-10-CM | POA: Diagnosis not present

## 2022-05-15 DIAGNOSIS — M549 Dorsalgia, unspecified: Secondary | ICD-10-CM | POA: Diagnosis present

## 2022-05-15 DIAGNOSIS — E1165 Type 2 diabetes mellitus with hyperglycemia: Secondary | ICD-10-CM | POA: Diagnosis not present

## 2022-05-15 LAB — CBC
HCT: 28.7 % — ABNORMAL LOW (ref 39.0–52.0)
Hemoglobin: 8.9 g/dL — ABNORMAL LOW (ref 13.0–17.0)
MCH: 26.9 pg (ref 26.0–34.0)
MCHC: 31 g/dL (ref 30.0–36.0)
MCV: 86.7 fL (ref 80.0–100.0)
Platelets: 300 10*3/uL (ref 150–400)
RBC: 3.31 MIL/uL — ABNORMAL LOW (ref 4.22–5.81)
RDW: 14.2 % (ref 11.5–15.5)
WBC: 10 10*3/uL (ref 4.0–10.5)
nRBC: 0 % (ref 0.0–0.2)

## 2022-05-15 LAB — COMPREHENSIVE METABOLIC PANEL
ALT: 14 U/L (ref 0–44)
AST: 22 U/L (ref 15–41)
Albumin: 2.4 g/dL — ABNORMAL LOW (ref 3.5–5.0)
Alkaline Phosphatase: 87 U/L (ref 38–126)
Anion gap: 7 (ref 5–15)
BUN: 16 mg/dL (ref 8–23)
CO2: 24 mmol/L (ref 22–32)
Calcium: 8.7 mg/dL — ABNORMAL LOW (ref 8.9–10.3)
Chloride: 101 mmol/L (ref 98–111)
Creatinine, Ser: 0.98 mg/dL (ref 0.61–1.24)
GFR, Estimated: >60 mL/min (ref >60)
Glucose, Bld: 280 mg/dL — ABNORMAL HIGH (ref 70–99)
Potassium: 4.1 mmol/L (ref 3.5–5.1)
Sodium: 132 mmol/L — ABNORMAL LOW (ref 135–145)
Total Bilirubin: 0.5 mg/dL (ref 0.3–1.2)
Total Protein: 6.2 g/dL — ABNORMAL LOW (ref 6.5–8.1)

## 2022-05-15 LAB — GLUCOSE, CAPILLARY
Glucose-Capillary: 298 mg/dL — ABNORMAL HIGH (ref 70–99)
Glucose-Capillary: 232 mg/dL — ABNORMAL HIGH (ref 70–99)
Glucose-Capillary: 179 mg/dL — ABNORMAL HIGH (ref 70–99)
Glucose-Capillary: 254 mg/dL — ABNORMAL HIGH (ref 70–99)

## 2022-05-15 LAB — SURGICAL PATHOLOGY

## 2022-05-15 MED ORDER — INSULIN STARTER KIT- PEN NEEDLES (ENGLISH)
1.0000 | Freq: Once | Status: AC
  Administered 2022-05-15: 1
  Filled 2022-05-15: qty 1

## 2022-05-15 MED ORDER — KETOROLAC TROMETHAMINE 15 MG/ML IJ SOLN
15.0000 mg | Freq: Four times a day (QID) | INTRAMUSCULAR | Status: DC
  Administered 2022-05-15 – 2022-05-16 (×5): 15 mg via INTRAVENOUS
  Filled 2022-05-15 (×5): qty 1

## 2022-05-15 MED ORDER — INSULIN GLARGINE-YFGN 100 UNIT/ML ~~LOC~~ SOLN
7.0000 [IU] | Freq: Every day | SUBCUTANEOUS | Status: DC
  Administered 2022-05-15: 7 [IU] via SUBCUTANEOUS
  Filled 2022-05-15: qty 0.07

## 2022-05-15 MED ORDER — LIVING WELL WITH DIABETES BOOK
Freq: Once | Status: AC
  Filled 2022-05-15: qty 1

## 2022-05-15 MED ORDER — INSULIN GLARGINE-YFGN 100 UNIT/ML ~~LOC~~ SOLN
8.0000 [IU] | Freq: Every day | SUBCUTANEOUS | Status: DC
Start: 2022-05-16 — End: 2022-05-16
  Administered 2022-05-16: 8 [IU] via SUBCUTANEOUS
  Filled 2022-05-15: qty 0.08

## 2022-05-15 NOTE — Inpatient Diabetes Management (Signed)
Inpatient Diabetes Program Recommendations  AACE/ADA: New Consensus Statement on Inpatient Glycemic Control (2015)  Target Ranges:  Prepandial:   less than 140 mg/dL      Peak postprandial:   less than 180 mg/dL (1-2 hours)      Critically ill patients:  140 - 180 mg/dL   Lab Results  Component Value Date   GLUCAP 232 (H) 05/15/2022   HGBA1C 10.8 (H) 03/29/2022    Review of Glycemic Control  Diabetes history: DM2 Outpatient Diabetes medications: glipizide 10 QD, metformin 1000 mg BID Current orders for Inpatient glycemic control: Semglee 8 QD, Novolog 0-15 TID with meals and 0-5 HS  HgbA1C - 10.8% Has glucose meter and monitors blood sugars at home.  Inpatient Diabetes Program Recommendations:    Agree with orders. Pt is willing to go home on basal insulin + meal coverage (4 shots/day). Pt to f/u with PCP within a week - 10 days.  Spoke with patient about new diabetes diagnosis.  Discussed A1C results (10.8%) and explained what an A1C is and informed patient that his current A1C indicates an average glucose of 263 mg/dl over the past 2-3 months. Discussed basic pathophysiology of DM Type 2, basic home care, importance of checking CBGs and maintaining good CBG control to prevent long-term and short-term complications. Reviewed glucose and A1C goals. Reviewed signs and symptoms of hyperglycemia and hypoglycemia along with treatment for both. Discussed impact of nutrition, exercise, stress, sickness, and medications on diabetes control.   Educated patient on insulin pen use at home. Reviewed contents of insulin flexpen starter kit. Reviewed all steps if insulin pen including attachment of needle, 2-unit air shot, dialing up dose, giving injection, removing needle, disposal of sharps, storage of unused insulin, disposal of insulin etc. Patient able to provide successful return demonstration. Also reviewed troubleshooting with insulin pen. MD to give patient Rxs for insulin pens and insulin  pen needles.  Ordered diabetes book and insulin pen starter kit. Pt seems very motivated to control his blood sugars at home.   Will f/u in am for any questions/concerns.   Thank you. Lorenda Peck, RD, LDN, [place] Inpatient Diabetes Coordinator [phone]
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
## 2022-05-15 NOTE — Progress Notes (Signed)
  Transition of Care [facility]) Screening Note   Patient Details  Name: Hector Lopez Date of Birth: Feb 14, 1946   Transition of Care [facility]) CM/SW Contact:    Lennart Pall, LCSW Phone Number: 05/15/2022, 11:03 AM    Transition of Care Department [facility]) has reviewed patient and no TOC needs have been identified at this time. We will continue to monitor patient advancement through interdisciplinary progression rounds. If new patient transition needs arise, please place a TOC consult.

## 2022-05-15 NOTE — Progress Notes (Signed)
1 Day Post-Op   Subjective/Chief Complaint: Still complaining of bilateral flank pain The epigastric pain is improved No nausea, tolerating clear liquids CBG up today Labs pending   Objective: Vital signs in last 24 hours: Temp:  [98.4 F (36.9 C)-99.4 F (37.4 C)] 98.6 F (37 C) (08/31 7711) Pulse Rate:  [74-103] 103 (08/31 0613) Resp:  [10-18] 16 (08/31 0613) BP: (116-171)/(63-88) 139/70 (08/31 0613) SpO2:  [94 %-100 %] 94 % (08/31 0613) Last BM Date : 05/11/22  Intake/Output from previous day: 08/30 0701 - 08/31 0700 In: 2403.8 [P.O.:600; I.V.:1803.8] Out: 1610 [Urine:1600; Blood:10] Intake/Output this shift: No intake/output data recorded.  General appearance: alert, cooperative, and no distress GI: soft, mild incisional tenderness Incisions c/d/i  Lab Results:  Recent Labs    05/13/22 1221 05/14/22 0432  WBC 11.5* 9.1  HGB 10.6* 9.3*  HCT 34.2* 30.3*  PLT 451* 341   BMET Recent Labs    05/13/22 1221 05/14/22 0432  NA 133* 133*  K 4.3 4.3  CL 100 105  CO2 24 23  GLUCOSE 265* 192*  BUN 19 13  CREATININE 1.09 0.88  CALCIUM 9.0 8.5*   PT/INR No results for input(s): "LABPROT", "INR" in the last 72 hours. ABG No results for input(s): "PHART", "HCO3" in the last 72 hours.  Invalid input(s): "PCO2", "PO2"  Studies/Results: US Abdomen Limited RUQ (LIVER/GB)  Result Date: 05/13/2022 CLINICAL DATA:  Right upper quadrant pain EXAM: ULTRASOUND ABDOMEN LIMITED RIGHT UPPER QUADRANT COMPARISON:  Ultrasound abdomen 03/28/2022.  CT abdomen 03/12/2022 FINDINGS: Gallbladder: Multiple small gallstones. Gallbladder is distended measuring 10 x 6 cm. Negative sonographic Murphy sign. Gallbladder wall thickness 2.6 mm Common bile duct: Diameter: 6.4 mm Liver: Hyperechoic liver parenchyma without focal liver lesion. Portal vein is patent on color Doppler imaging with normal direction of blood flow towards the liver. Other: Right nephrectomy.  Mild ascites under the  liver. IMPRESSION: Multiple small gallstones. Gallbladder is distended. Negative for biliary dilatation Hyperechoic liver suggesting chronic liver disease and or fatty liver. Small amount of ascites. Electronically Signed   By: Franchot Gallo M.D.   On: 05/13/2022 13:20    Anti-infectives: Anti-infectives (From admission, onward)    Start     Dose/Rate Route Frequency Ordered Stop   05/13/22 1600  cefTRIAXone (ROCEPHIN) 2 g in sodium chloride 0.9 % 100 mL IVPB        2 g 200 mL/hr over 30 Minutes Intravenous Every 24 hours 05/13/22 1523 05/20/22 1559       Assessment/Plan: Chronic calculus cholecystitis S/p laparoscopic cholecystectomy - unable to obtain cholangiogram Chronic bilateral flank pain  Awaiting labs - evaluate LFT's Add Toradol for pain control Consult TRH to help manage his diabetes and other medical issues.  Mobilization Advance to heart healthy diet.  LOS: 0 days    Maia Petties 05/15/2022

## 2022-05-15 NOTE — Progress Notes (Signed)
Mobility Specialist - Progress Note     05/15/22 1611  Mobility  Activity Ambulated with assistance in hallway  Level of Assistance Contact guard assist, steadying assist  Assistive Device  (Hallway Rails)  Distance Ambulated (ft) 500 ft  Activity Response Tolerated well  $Mobility charge 1 Mobility   Pt was found in bed and agreeable to mobilize. At the beginning of ambulation was unsteady and needed to hold on to hallway rails for a couple of steps , stated he had not been up prior to today for a couple of days. Had no other complaints and at EOS returned to bed with all necessities in reach.   Ferd Hibbs Mobility Specialist

## 2022-05-16 DIAGNOSIS — K8020 Calculus of gallbladder without cholecystitis without obstruction: Secondary | ICD-10-CM | POA: Diagnosis not present

## 2022-05-16 LAB — GLUCOSE, CAPILLARY: Glucose-Capillary: 166 mg/dL — ABNORMAL HIGH (ref 70–99)

## 2022-05-16 MED ORDER — OXYCODONE HCL 5 MG PO TABS: 5.0000 mg | ORAL_TABLET | Freq: Four times a day (QID) | ORAL | 0 refills | Status: AC | PRN

## 2022-05-16 MED ORDER — INSULIN LISPRO (1 UNIT DIAL) 100 UNIT/ML (KWIKPEN): 3.0000 [IU] | PEN_INJECTOR | Freq: Three times a day (TID) | SUBCUTANEOUS | 0 refills | Status: DC

## 2022-05-16 MED ORDER — "PEN NEEDLES 3/16"" 31G X 5 MM MISC": 1.0000 | Freq: Four times a day (QID) | 2 refills | Status: DC

## 2022-05-16 MED ORDER — BLOOD GLUCOSE MONITOR KIT: PACK | 0 refills | Status: DC

## 2022-05-16 MED ORDER — BASAGLAR KWIKPEN 100 UNIT/ML ~~LOC~~ SOPN: 8.0000 [IU] | PEN_INJECTOR | Freq: Every day | SUBCUTANEOUS | 0 refills | Status: DC

## 2022-05-16 MED ORDER — METHOCARBAMOL 750 MG PO TABS: 750.0000 mg | ORAL_TABLET | Freq: Three times a day (TID) | ORAL | 0 refills | Status: AC | PRN

## 2022-05-16 NOTE — Plan of Care (Signed)
?  Problem: Activity: ?Goal: Risk for activity intolerance will decrease ?Outcome: Progressing ?  ?Problem: Nutrition: ?Goal: Adequate nutrition will be maintained ?Outcome: Progressing ?  ?Problem: Elimination: ?Goal: Will not experience complications related to urinary retention ?Outcome: Progressing ?  ?Problem: Pain Managment: ?Goal: General experience of comfort will improve ?Outcome: Progressing ?  ?

## 2022-05-16 NOTE — Progress Notes (Signed)
Discharge instructions given to patient and all questions were answered.  

## 2022-05-16 NOTE — Discharge Summary (Signed)
[facility] Discharge Summary   Patient ID: Hector Lopez MRN: [id number] DOB/AGE: Dec 08, 1945 76 y.o.  Admit date: 05/13/2022 Discharge date: 05/16/2022  Admitting Diagnosis: RUQ pain [R10.11] Calculus of gallbladder without cholecystitis without obstruction [K80.20] Symptomatic cholelithiasis [K80.20] diabetes mellitus type II   Discharge Diagnosis RUQ pain [R10.11] Calculus of gallbladder without cholecystitis without obstruction [K80.20] Symptomatic cholelithiasis [K80.20] diabetes mellitus type II  S/p Laparoscopic Cholecystectomy  Consultants [facility] hospitalists   Imaging: No results found.  Procedures Dr. Georgette Dover (05/14/22) - Laparoscopic Cholecystectomy with [facility]  Hospital Course:  76 year old who presented to [facility] with abdominal pain.  Workup showed symptomatic cholelithiasis.  Patient was admitted and underwent procedure listed above.  Tolerated procedure well and was transferred to the floor.  Diet was advanced as tolerated. Patient has underlying diabetes mellitus type II and hospitalist team was consulted for assistance with management. Insulin regimen was adjusted at discharge. On POD2, the patient was voiding well, tolerating diet, ambulating well, pain well controlled, vital signs stable, incisions c/d/i and felt stable for discharge home.  Patient will follow up in our office in 3 weeks and knows to call with questions or concerns.     Physical Exam: General:  Alert, NAD, pleasant, comfortable Abd:  Soft, ND, mild tenderness, incisions C/D/I with steri strips  I or a member of my team have reviewed this patient in the Controlled Substance Database.  Allergies as of 05/16/2022       Reactions   Codeine Nausea And Vomiting        Medication List     STOP taking these medications    glipiZIDE 10 MG tablet Commonly known as: GLUCOTROL   traMADol 50 MG tablet Commonly known as: ULTRAM       TAKE these medications    acetaminophen 500 MG  tablet Commonly known as: TYLENOL Take 1,000 mg by mouth every 6 (six) hours as needed (for pain).   amLODipine 10 MG tablet Commonly known as: NORVASC Take 10 mg by mouth daily in the afternoon.   aspirin EC 81 MG tablet Take 81 mg by mouth daily. Swallow whole.   Basaglar KwikPen 100 UNIT/ML Inject 8 Units into the skin daily.   blood glucose meter kit and supplies Kit Dispense based on patient and insurance preference. Use up to four times daily as directed.   cetirizine 10 MG tablet Commonly known as: ZYRTEC Take 10 mg by mouth daily.   cimetidine 200 MG tablet Commonly known as: TAGAMET Take 200 mg by mouth daily as needed (for indigestion).   finasteride 5 MG tablet Commonly known as: PROSCAR Take 5 mg by mouth daily.   gabapentin 400 MG capsule Commonly known as: NEURONTIN Take 1 capsule (400 mg total) by mouth 3 (three) times daily.   insulin lispro 100 UNIT/ML KwikPen Commonly known as: HumaLOG KwikPen Inject 3 Units into the skin 3 (three) times daily.   latanoprost 0.005 % ophthalmic solution Commonly known as: XALATAN Place 1 drop into the left eye at bedtime.   lidocaine-prilocaine cream Commonly known as: EMLA Apply 1 application topically as needed.   linaclotide 145 MCG Caps capsule Commonly known as: LINZESS Take 1 capsule (145 mcg total) by mouth daily before breakfast.   lisinopril 40 MG tablet Commonly known as: ZESTRIL Take 40 mg by mouth daily in the afternoon.   megestrol 400 MG/10ML suspension Commonly known as: MEGACE Take 10 mLs (400 mg total) by mouth daily.   metFORMIN 1000 MG tablet Commonly known  as: GLUCOPHAGE Take 1,000 mg by mouth in the morning and at bedtime.   methocarbamol 750 MG tablet Commonly known as: Robaxin-750 Take 1 tablet (750 mg total) by mouth every 8 (eight) hours as needed for up to 5 days for muscle spasms (abdominal pain).   oxyCODONE 5 MG immediate release tablet Commonly known as: Oxy  IR/ROXICODONE Take 1 tablet (5 mg total) by mouth every 6 (six) hours as needed for up to 3 days for moderate pain or severe pain. What changed:  how much to take reasons to take this   pantoprazole 40 MG tablet Commonly known as: PROTONIX Take 1 tablet (40 mg total) by mouth 2 (two) times daily. What changed: when to take this   Pen Needles 3/16" 31G X 5 MM Misc 1 each by Does not apply route 4 (four) times daily.   phenylephrine 1 % nasal spray Commonly known as: NEO-SYNEPHRINE Place 1 drop into both nostrils 3 (three) times daily as needed for congestion.   polyethylene glycol powder 17 GM/SCOOP powder Commonly known as: GLYCOLAX/MIRALAX Take 17 g mixed in 4 to 8 ounces of liquid by mouth daily. What changed: See the new instructions.   prochlorperazine 10 MG tablet Commonly known as: COMPAZINE TAKE ONE TABLET BY MOUTH EVERY 6 HOURS AS NEEDED FOR NAUSEA AND FOR VOMITING What changed: See the new instructions.   senna-docusate 8.6-50 MG tablet Commonly known as: Senokot-S Take 1 tablet by mouth daily.   sertraline 50 MG tablet Commonly known as: ZOLOFT Take 50 mg by mouth daily at 12 noon.   simvastatin 20 MG tablet Commonly known as: ZOCOR Take 20 mg by mouth at bedtime.   sodium phosphate 7-19 GM/118ML Enem Place 133 mLs (1 enema total) rectally daily as needed for up to 1 dose for severe constipation.          Follow-up Information     Maczis, Carlena Hurl, [place]. Go on 06/05/2022.   Specialty: General Surgery Why: Your appointment is 06/05/22 1:30 pm Arrive 56mn early to check in, fill out paperwork, BEngineer, civil (consulting)ID and insurance information Contact information: [phone]                Signed: MCaroll RancherCSt Mary'S Good Samaritan HospitalSurgery 05/16/2022, 11:03 AM Please see Amion for pager number during day hours 7:00am-4:30pm

## 2022-05-16 NOTE — Progress Notes (Signed)
Mobility Specialist - Progress Note     05/16/22 1021  Mobility  Activity Ambulated with assistance in hallway  Range of Motion/Exercises Active  Level of Assistance Modified independent, requires aide device or extra time  Assistive Device  (IV Pole)  Distance Ambulated (ft) 500 ft  Activity Response Tolerated well  $Mobility charge 1 Mobility   Pt was found in bed and agreeable to mobilize. Was unsteady the first couple couple of steps but was able to maintain balance afterwards. At EOS returned to bed with all necessities in reach.  Ferd Hibbs Mobility Specialist

## 2022-05-16 NOTE — Progress Notes (Signed)
PROGRESS NOTE  Hector Lopez QPR:[id number] DOB: 1946-05-18 DOA: 05/13/2022 PCP: [name], MD   LOS: 1 day   Brief Narrative / Interim history: 76 year old male with history of DM 2, HTN, HLD, urothelial carcinoma, who was admitted for elective cholecystectomy on surgery service.  [facility] was consulted for diabetes management.  Subjective / 24h Interval events: Feeling well this morning, no significant pain, no nausea or vomiting.  Tolerating breakfast  Assesement and Plan: Principal Problem:   Symptomatic cholelithiasis Active Problems:   Ureteral neoplasm   Type 2 diabetes mellitus with hyperglycemia (HCC)   Hypertension   HLD (hyperlipidemia)   Principal problem Type 2 diabetes mellitus-poorly controlled, with hyperglycemia, last A1c was 10.8.  Diabetes coordinator consulted and followed patient while hospitalized also.  He was placed on 8 units of long-acting insulin and sliding scale, requiring about 3 units with each meal.  On this regimen, his CBGs seem to be under better control and will be discharged home on this regimen.  He was advised to follow-up with his PCP within 1 to 2 weeks, keep a CBG log as his insulin regimen is likely to need further adjustments.  Given that he was placed on insulin, stop home glipizide, but continue metformin.  He appears to be stable for discharge from medicine standpoint.  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  Scheduled Meds:  amLODipine  10 mg Oral QHS   Chlorhexidine Gluconate Cloth  6 each Topical Daily   docusate sodium  100 mg Oral BID   enoxaparin (LOVENOX) injection  40 mg Subcutaneous Q24H   finasteride  5 mg Oral Daily   gabapentin  400 mg Oral TID   insulin aspart  0-15  Units Subcutaneous TID WC   insulin aspart  0-5 Units Subcutaneous QHS   insulin glargine-yfgn  8 Units Subcutaneous Daily   ketorolac  15 mg Intravenous Q6H   latanoprost  1 drop Left Eye QHS   linaclotide  145 mcg Oral QAC breakfast   lisinopril  40 mg Oral QHS   megestrol  400 mg Oral Daily   mupirocin ointment  1 Application Nasal BID   pantoprazole  40 mg Oral BID   sertraline  50 mg Oral Q1200   simvastatin  20 mg Oral QHS   Continuous Infusions:  sodium chloride 75 mL/hr at 05/15/22 1200   cefTRIAXone (ROCEPHIN)  IV 2 g (05/15/22 1641)   methocarbamol (ROBAXIN) IV     PRN Meds:.acetaminophen **OR** acetaminophen, diphenhydrAMINE **OR** diphenhydrAMINE, HYDROmorphone (DILAUDID) injection, methocarbamol **OR** methocarbamol (ROBAXIN) IV, metoprolol tartrate, ondansetron **OR** ondansetron (ZOFRAN) IV, oxyCODONE, polyethylene glycol, prochlorperazine **OR** prochlorperazine, simethicone  Diet Orders (From admission, onward)     Start     Ordered   05/15/22 0752  Diet Heart Room service appropriate? Yes; Fluid consistency: Thin  Diet effective now       Question Answer Comment  Room service appropriate? Yes   Fluid consistency: Thin      05/15/22 0751            DVT prophylaxis: enoxaparin (LOVENOX) injection 40 mg Start: 05/13/22 2200 SCDs Start: 05/13/22 1521   Lab Results  Component Value Date   PLT 300 05/15/2022      Code Status: Full Code  Family Communication: no family at bedside   Status is: Inpatient  Level of care: Med-Surg  Objective:  Vitals:   05/15/22 0613 05/15/22 1341 05/15/22 2056 05/16/22 0522  BP: 139/70 133/75 137/72 (!) 140/72  Pulse: (!) 103 87 74 78  Resp: '16 17 16 16  '$ Temp: 98.6 F (37 C) 98.5 F (36.9 C) 98.8 F (37.1 C) 98.4 F (36.9 C)  TempSrc: Oral Oral Oral Oral  SpO2: 94% 95% 95% 95%  Weight:      Height:        Intake/Output Summary (Last 24 hours) at 05/16/2022 0957 Last data filed at 05/16/2022 0943 Gross per 24  hour  Intake 2401.96 ml  Output 350 ml  Net 2051.96 ml   Wt Readings from Last 3 Encounters:  05/14/22 77.1 kg  04/28/22 77.1 kg  04/04/22 78 kg    Examination:  Constitutional: NAD Eyes: no scleral icterus ENMT: Mucous membranes are moist.  Neck: normal, supple Respiratory: clear to auscultation bilaterally, no wheezing, no crackles. Cardiovascular: Regular rate and rhythm, no murmurs / rubs / gallops.   Abdomen: non distended, no tenderness. Bowel sounds positive.  Musculoskeletal: no clubbing / cyanosis.  Skin: no rashes   Data Reviewed: I have independently reviewed following labs and imaging studies   CBC Recent Labs  Lab 05/13/22 1221 05/14/22 0432 05/15/22 0823  WBC 11.5* 9.1 10.0  HGB 10.6* 9.3* 8.9*  HCT 34.2* 30.3* 28.7*  PLT 451* 341 300  MCV 87.5 87.8 86.7  MCH 27.1 27.0 26.9  MCHC 31.0 30.7 31.0  RDW 14.5 14.4 14.2    Recent Labs  Lab 05/13/22 1221 05/14/22 0432 05/15/22 0823  NA 133* 133* 132*  K 4.3 4.3 4.1  CL 100 105 101  CO2 '24 23 24  '$ GLUCOSE 265* 192* 280*  BUN '19 13 16  '$ CREATININE 1.09 0.88 0.98  CALCIUM 9.0 8.5* 8.7*  AST 14* 8* 22  ALT '12 9 14  '$ ALKPHOS 127* 102 87  BILITOT 0.7 0.6 0.5  ALBUMIN 3.0* 2.4* 2.4*    ------------------------------------------------------------------------------------------------------------------ No results for input(s): "CHOL", "HDL", "LDLCALC", "TRIG", "CHOLHDL", "LDLDIRECT" in the last 72 hours.  Lab Results  Component Value Date   HGBA1C 10.8 (H) 03/29/2022   ------------------------------------------------------------------------------------------------------------------ No results for input(s): "TSH", "T4TOTAL", "T3FREE", "THYROIDAB" in the last 72 hours.  Invalid input(s): "FREET3"  Cardiac Enzymes No results for input(s): "CKMB", "TROPONINI", "MYOGLOBIN" in the last 168 hours.  Invalid input(s):  "CK" ------------------------------------------------------------------------------------------------------------------ No results found for: "BNP"  CBG: Recent Labs  Lab 05/15/22 0733 05/15/22 1157 05/15/22 1643 05/15/22 2058 05/16/22 0739  GLUCAP 298* 232* 179* 254* 166*    Recent Results (from the past 240 hour(s))  Urine Culture     Status: Abnormal   Collection Time: 05/13/22 10:50 AM   Specimen: Urine, Clean Catch  Result Value Ref Range Status   Specimen Description   Final    URINE, CLEAN CATCH Performed at [facility], [place] [address] [place], [place] [address]    Special Requests   Final    NONE Performed at [facility], [place] [address] [place], [place] [address]    Culture (A)  Final    <10,000 COLONIES/mL INSIGNIFICANT GROWTH Performed at [place] [address] [place], [place] [address]    Report Status 05/14/2022 FINAL  Final  Surgical pcr screen     Status: Abnormal   Collection Time: 05/13/22  7:40 PM   Specimen: Nasal Mucosa; Nasal Swab  Result Value Ref Range Status   MRSA, PCR NEGATIVE NEGATIVE Final   Staphylococcus aureus POSITIVE (  A) NEGATIVE Final    Comment: (NOTE) The Xpert SA Assay (FDA approved for NASAL specimens in patients 52 years of age and older), is one component of a comprehensive surveillance program. It is not intended to diagnose infection nor to guide or monitor treatment. Performed at [facility], [place] [address] [place], [place] [address]      Radiology Studies: No results found.   Marzetta Board, MD, PhD [facility]  Between 7 am - 7 pm I am available, please contact me via Amion (for emergencies) or Securechat (non urgent messages)  Between 7 pm - 7 am I am not available, please contact night coverage MD/APP via Amion

## 2022-05-20 ENCOUNTER — Ambulatory Visit: Payer: Medicare Other | Admitting: Radiation Oncology

## 2022-05-20 ENCOUNTER — Ambulatory Visit: Payer: Medicare Other

## 2022-05-20 DIAGNOSIS — D509 Iron deficiency anemia, unspecified: Secondary | ICD-10-CM | POA: Diagnosis not present

## 2022-05-20 DIAGNOSIS — N183 Chronic kidney disease, stage 3 unspecified: Secondary | ICD-10-CM | POA: Diagnosis not present

## 2022-05-20 DIAGNOSIS — E1122 Type 2 diabetes mellitus with diabetic chronic kidney disease: Secondary | ICD-10-CM | POA: Diagnosis not present

## 2022-05-21 ENCOUNTER — Other Ambulatory Visit: Payer: Self-pay

## 2022-05-26 ENCOUNTER — Inpatient Hospital Stay: Payer: Medicare Other | Admitting: Oncology

## 2022-05-26 ENCOUNTER — Telehealth: Payer: Self-pay | Admitting: Oncology

## 2022-05-26 NOTE — Telephone Encounter (Signed)
R/s per 9/11 in basket, message has been left with pt

## 2022-06-02 ENCOUNTER — Other Ambulatory Visit: Payer: Self-pay

## 2022-06-02 ENCOUNTER — Ambulatory Visit: Payer: Medicare Other | Admitting: Gastroenterology

## 2022-06-02 ENCOUNTER — Inpatient Hospital Stay: Payer: Medicare Other | Attending: Oncology | Admitting: Oncology

## 2022-06-02 VITALS — BP 130/77 | HR 95 | Temp 97.4°F | Resp 18 | Ht 70.0 in | Wt 152.2 lb

## 2022-06-02 DIAGNOSIS — I700 Atherosclerosis of aorta: Secondary | ICD-10-CM | POA: Diagnosis not present

## 2022-06-02 DIAGNOSIS — C679 Malignant neoplasm of bladder, unspecified: Secondary | ICD-10-CM | POA: Diagnosis not present

## 2022-06-02 DIAGNOSIS — R739 Hyperglycemia, unspecified: Secondary | ICD-10-CM | POA: Diagnosis not present

## 2022-06-02 DIAGNOSIS — C649 Malignant neoplasm of unspecified kidney, except renal pelvis: Secondary | ICD-10-CM

## 2022-06-02 DIAGNOSIS — D709 Neutropenia, unspecified: Secondary | ICD-10-CM | POA: Diagnosis not present

## 2022-06-02 MED ORDER — HYDROMORPHONE HCL 4 MG PO TABS: 4.0000 mg | ORAL_TABLET | ORAL | 0 refills | Status: DC | PRN

## 2022-06-02 NOTE — Progress Notes (Signed)
Hematology and Oncology Follow Up Visit  Hector Lopez [id number] Jan 27, 1946 76 y.o. 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   Prior Therapy:  He underwent robotic assisted laparoscopic  nephroureterectomy completed on Feb 10, 2020.  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  He was found to have metastatic pericaval lymph node as well as a right common iliac lymph node.  Carboplatin and gemcitabine started on July 04, 2020.  He completed 6 cycles of therapy on October 17, 2020.  Current therapy: Nivolumab 480 mg every 4 weeks started on October 29, 2021.  He completed 4 cycles of therapy in June 2023.  Therapy is on hold due to health issues and chronic abdominal and flank pain.   Interim History: Hector Lopez presents today for a follow-up.  Since the last visit, he underwent laparoscopic cholecystectomy completed on May 14, 2022.  Since his surgery and discharge, he reports no major changes in his pain level.  He reports no back pain anymore but has reported abdominal pain which continues to be debilitating for him.  He continues to lose weight and reports hoarseness in his voice.  He has used oral Dilaudid with some relief in his pain but cause him to be sleepy and continues to eat poorly.  His performance status is declining.   Medications: Updated on review. Current Outpatient Medications  Medication Sig Dispense Refill   acetaminophen (TYLENOL) 500 MG tablet Take 1,000 mg by mouth every 6 (six) hours as needed (for pain).     amLODipine (NORVASC) 10 MG tablet Take 10 mg by mouth daily in the afternoon.     aspirin EC 81 MG tablet Take 81 mg by mouth daily. Swallow whole.     blood glucose meter kit and supplies KIT Dispense based on  patient and insurance preference. Use up to four times daily as directed. 1 each 0   cetirizine (ZYRTEC) 10 MG tablet Take 10 mg by mouth daily.     cimetidine (TAGAMET) 200 MG tablet Take 200 mg by mouth daily as needed (for indigestion).     finasteride (PROSCAR) 5 MG tablet Take 5 mg by mouth daily.     gabapentin (NEURONTIN) 400 MG capsule Take 1 capsule (400 mg total) by mouth 3 (three) times daily. 90 capsule 3   Insulin Glargine (BASAGLAR KWIKPEN) 100 UNIT/ML Inject 8 Units into the skin daily. 15 mL 0   insulin lispro (HUMALOG KWIKPEN) 100 UNIT/ML KwikPen Inject 3 Units into the skin 3 (three) times daily. 15 mL 0   Insulin Pen Needle (PEN NEEDLES 3/16") 31G X 5 MM MISC 1 each by Does not apply route 4 (four) times daily. 120 each 2   latanoprost (XALATAN) 0.005 % ophthalmic solution Place 1 drop into the left eye at bedtime.     lidocaine-prilocaine (EMLA) cream Apply 1 application topically as needed. (Patient not taking: Reported on 05/13/2022) 30 g 0   linaclotide (LINZESS) 145 MCG CAPS capsule Take 1 capsule (145 mcg total) by mouth daily before breakfast. (Patient not taking: Reported on 05/13/2022) 30 capsule 0   lisinopril (ZESTRIL) 40 MG tablet Take 40 mg by mouth daily in the afternoon.     megestrol (MEGACE) 400 MG/10ML suspension Take 10 mLs (400 mg total) by mouth daily. 240 mL 1  metFORMIN (GLUCOPHAGE) 1000 MG tablet Take 1,000 mg by mouth in the morning and at bedtime.     pantoprazole (PROTONIX) 40 MG tablet Take 1 tablet (40 mg total) by mouth 2 (two) times daily. (Patient taking differently: Take 40 mg by mouth daily before lunch.) 30 tablet 0   phenylephrine (NEO-SYNEPHRINE) 1 % nasal spray Place 1 drop into both nostrils 3 (three) times daily as needed for congestion.     polyethylene glycol powder (GLYCOLAX/MIRALAX) 17 GM/SCOOP powder Take 17 g mixed in 4 to 8 ounces of liquid by mouth daily. (Patient taking differently: Take 17 g by mouth daily as needed for mild  constipation (mix into 4-8 ounces of water).) 238 g 0   prochlorperazine (COMPAZINE) 10 MG tablet TAKE ONE TABLET BY MOUTH EVERY 6 HOURS AS NEEDED FOR NAUSEA AND FOR VOMITING (Patient taking differently: Take 10 mg by mouth See admin instructions. Take 10 mg by mouth 2-3 times a day) 30 tablet 0   senna-docusate (SENOKOT-S) 8.6-50 MG tablet Take 1 tablet by mouth daily. (Patient not taking: Reported on 05/13/2022) 30 tablet 0   sertraline (ZOLOFT) 50 MG tablet Take 50 mg by mouth daily at 12 noon.     simvastatin (ZOCOR) 20 MG tablet Take 20 mg by mouth at bedtime.     sodium phosphate (FLEET) 7-19 GM/118ML ENEM Place 133 mLs (1 enema total) rectally daily as needed for up to 1 dose for severe constipation. 133 mL 2   No current facility-administered medications for this visit.     Allergies:  Allergies  Allergen Reactions   Codeine Nausea And Vomiting      Physical Exam:       Blood pressure 130/77, pulse 95, temperature (!) 97.4 F (36.3 C), temperature source Tympanic, resp. rate 18, height 5' 10"  (1.778 m), weight 152 lb 3.2 oz (69 kg), SpO2 99 %.      ECOG: 2    General appearance: Alert, awake appeared more cachectic. Head: Atraumatic without abnormalities Oropharynx: Without any thrush or ulcers. Eyes: No scleral icterus. Lymph nodes: No lymphadenopathy noted in the cervical, supraclavicular, or axillary nodes Heart:regular rate and rhythm, without any murmurs or gallops.   Lung: Clear to auscultation without any rhonchi, wheezes or dullness to percussion. Abdomin: Soft, nontender without any shifting dullness or ascites. Musculoskeletal: No clubbing or cyanosis. Neurological: No motor or sensory deficits. Skin: No rashes or lesions.                 Lab Results: Lab Results  Component Value Date   WBC 10.0 05/15/2022   HGB 8.9 (L) 05/15/2022   HCT 28.7 (L) 05/15/2022   MCV 86.7 05/15/2022   PLT 300 05/15/2022     Chemistry      Component  Value Date/Time   NA 132 (L) 05/15/2022 0823   K 4.1 05/15/2022 0823   CL 101 05/15/2022 0823   CO2 24 05/15/2022 0823   BUN 16 05/15/2022 0823   CREATININE 0.98 05/15/2022 0823   CREATININE 1.12 04/28/2022 1020      Component Value Date/Time   CALCIUM 8.7 (L) 05/15/2022 0823   ALKPHOS 87 05/15/2022 0823   AST 22 05/15/2022 0823   AST 9 (L) 04/28/2022 1020   ALT 14 05/15/2022 0823   ALT 5 04/28/2022 1020   BILITOT 0.5 05/15/2022 0823   BILITOT 0.4 04/28/2022 1020      IMPRESSION: 1. Enlarging hypermetabolic tumor/adenopathy anterior to the abdominal aorta, currently 4.4 cm in long axis,  maximum SUV 10.2 compatible with active malignancy. This abuts and slightly effaces the IVC, anteriorly displaces the transverse duodenum, and surrounds a 155 degree arc of the abdominal aorta. No other sites of active malignancy identified. 2. Other imaging findings of potential clinical significance: Chronic paranasal sinusitis. Aortic Atherosclerosis (ICD10-I70.0). Coronary and systemic atherosclerosis. Right nephro ureterectomy. Cholelithiasis. Chronic calcific pancreatitis. Sigmoid colon diverticulosis. Left inguinal hernia containing part of the sigmoid colon. Supraumbilical ventral hernia containing adipose tissue.    Impression and Plan:  76 year old man with:   1. Stage IV high-grade urothelial carcinoma diagnosed in 2023.    The natural course of this disease was reviewed.  PET scan obtained on May 07, 2022 was personally reviewed and showed localized disease with a 4.4 cm tumor with SUV uptake affecting the IVC and transverse to the duodenum.  This appears to have progressed on immunotherapy.   Treatment options were discussed at this time.  Radiation therapy targeted to the area of metastasis could be considered versus restarting systemic chemotherapy with Padcev.  Risks and benefits of all these options were discussed.  Complications with Padcev that include nausea,  vomiting, myelosuppression, neuropathy and hyperglycemia were reviewed.  After discussion today, we opted to consider radiation therapy.  He feels that he is too weak to withstand chemotherapy which I certainly agree at this time.  I will make the referral to radiation oncology.   2.  IV access: Port-A-Cath is in place and will be in use for the future.    3.  Hyperglycemia: His blood sugar remains elevated and continues to follow-up with his primary care physician regarding this issue.   4.  Back pain/abdominal pain: Differential diagnosis related to cholecystitis versus malignancy were reviewed.  We had a long discussion today to discuss how to best manage his pain.  We have discussed interventional radiology as well as pain management specialist.  For the time being I will refill his oral Dilaudid and refer him to pain management.  5.  Prognosis and goals of care: We have discussed this in detail.  He agrees that he is on a better trajectory if he continues on his straight he likely will not survive much longer.  Although he has incurable cancer his disease is not widespread and his symptoms are out of proportion to his cancer presentation.  He is still willing to continue with aggressive measures.  6.  Nutritional status: He continues to lose weight.  We have discussed strategies to improve his intake and appetite.  He has Megace he is scheduled to see GI in the near future.  Continue to emphasize the importance of using nutritional supplements.  Further nutritional decline will likely contribute to his poor prognosis.   7 .  Follow-up: In the next few weeks to follow his progress.   30  minutes were spent on this visit.  The time was dedicated to reviewing imaging studies, treatment choices and addressing complications related to cancer and cancer therapy.      Zola Button, MD 9/18/202310:01 AM
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
## 2022-06-03 ENCOUNTER — Telehealth: Payer: Self-pay | Admitting: *Deleted

## 2022-06-03 ENCOUNTER — Other Ambulatory Visit: Payer: Self-pay

## 2022-06-03 ENCOUNTER — Encounter: Payer: Self-pay | Admitting: Oncology

## 2022-06-03 NOTE — Telephone Encounter (Signed)
PC to patient regarding MyChart message about his pain, informed patient Dr Alen Blew sent a refill for his pain medication to his pharmacy yesterday, also a referral has been sent to the pain clinic & they should be contacting him.  Also informed patient he has appointment with Dr Tammi Klippel on October 10 for radiation referral.  Instructed patient to call this office with any further questions/concerns, he verbalizes understanding.

## 2022-06-04 ENCOUNTER — Other Ambulatory Visit: Payer: Self-pay | Admitting: Oncology

## 2022-06-12 ENCOUNTER — Other Ambulatory Visit: Payer: Self-pay | Admitting: Oncology

## 2022-06-13 ENCOUNTER — Emergency Department (HOSPITAL_COMMUNITY): Payer: Medicare Other

## 2022-06-13 ENCOUNTER — Inpatient Hospital Stay (HOSPITAL_COMMUNITY)
Admission: EM | Admit: 2022-06-13 | Discharge: 2022-06-26 | DRG: 844 | Disposition: A | Discharge status: Hospice | Payer: Medicare Other | Attending: Internal Medicine | Admitting: Internal Medicine

## 2022-06-13 ENCOUNTER — Other Ambulatory Visit: Payer: Self-pay

## 2022-06-13 ENCOUNTER — Encounter (HOSPITAL_COMMUNITY): Payer: Self-pay | Admitting: Emergency Medicine

## 2022-06-13 DIAGNOSIS — K222 Esophageal obstruction: Secondary | ICD-10-CM | POA: Diagnosis not present

## 2022-06-13 DIAGNOSIS — F419 Anxiety disorder, unspecified: Secondary | ICD-10-CM | POA: Diagnosis present

## 2022-06-13 DIAGNOSIS — Z66 Do not resuscitate: Secondary | ICD-10-CM | POA: Diagnosis not present

## 2022-06-13 DIAGNOSIS — R52 Pain, unspecified: Principal | ICD-10-CM

## 2022-06-13 DIAGNOSIS — R4589 Other symptoms and signs involving emotional state: Secondary | ICD-10-CM

## 2022-06-13 DIAGNOSIS — Z515 Encounter for palliative care: Secondary | ICD-10-CM

## 2022-06-13 DIAGNOSIS — E46 Unspecified protein-calorie malnutrition: Secondary | ICD-10-CM

## 2022-06-13 DIAGNOSIS — R070 Pain in throat: Secondary | ICD-10-CM | POA: Diagnosis not present

## 2022-06-13 DIAGNOSIS — R109 Unspecified abdominal pain: Secondary | ICD-10-CM

## 2022-06-13 DIAGNOSIS — K5730 Diverticulosis of large intestine without perforation or abscess without bleeding: Secondary | ICD-10-CM | POA: Diagnosis present

## 2022-06-13 DIAGNOSIS — E782 Mixed hyperlipidemia: Secondary | ICD-10-CM | POA: Diagnosis not present

## 2022-06-13 DIAGNOSIS — E440 Moderate protein-calorie malnutrition: Secondary | ICD-10-CM | POA: Diagnosis present

## 2022-06-13 DIAGNOSIS — R531 Weakness: Secondary | ICD-10-CM

## 2022-06-13 DIAGNOSIS — E1165 Type 2 diabetes mellitus with hyperglycemia: Secondary | ICD-10-CM | POA: Diagnosis not present

## 2022-06-13 DIAGNOSIS — E860 Dehydration: Secondary | ICD-10-CM | POA: Diagnosis present

## 2022-06-13 DIAGNOSIS — M549 Dorsalgia, unspecified: Secondary | ICD-10-CM | POA: Diagnosis present

## 2022-06-13 DIAGNOSIS — G8918 Other acute postprocedural pain: Secondary | ICD-10-CM | POA: Diagnosis not present

## 2022-06-13 DIAGNOSIS — Z79818 Long term (current) use of other agents affecting estrogen receptors and estrogen levels: Secondary | ICD-10-CM

## 2022-06-13 DIAGNOSIS — C772 Secondary and unspecified malignant neoplasm of intra-abdominal lymph nodes: Secondary | ICD-10-CM | POA: Diagnosis present

## 2022-06-13 DIAGNOSIS — R1900 Intra-abdominal and pelvic swelling, mass and lump, unspecified site: Secondary | ICD-10-CM | POA: Diagnosis not present

## 2022-06-13 DIAGNOSIS — K668 Other specified disorders of peritoneum: Secondary | ICD-10-CM | POA: Diagnosis not present

## 2022-06-13 DIAGNOSIS — K5641 Fecal impaction: Secondary | ICD-10-CM | POA: Diagnosis present

## 2022-06-13 DIAGNOSIS — G8929 Other chronic pain: Secondary | ICD-10-CM | POA: Diagnosis present

## 2022-06-13 DIAGNOSIS — K219 Gastro-esophageal reflux disease without esophagitis: Secondary | ICD-10-CM | POA: Diagnosis not present

## 2022-06-13 DIAGNOSIS — N281 Cyst of kidney, acquired: Secondary | ICD-10-CM | POA: Diagnosis not present

## 2022-06-13 DIAGNOSIS — R627 Adult failure to thrive: Secondary | ICD-10-CM | POA: Diagnosis not present

## 2022-06-13 DIAGNOSIS — Z794 Long term (current) use of insulin: Secondary | ICD-10-CM | POA: Diagnosis not present

## 2022-06-13 DIAGNOSIS — Z681 Body mass index (BMI) 19 or less, adult: Secondary | ICD-10-CM

## 2022-06-13 DIAGNOSIS — Z7189 Other specified counseling: Secondary | ICD-10-CM | POA: Diagnosis not present

## 2022-06-13 DIAGNOSIS — R634 Abnormal weight loss: Secondary | ICD-10-CM | POA: Diagnosis not present

## 2022-06-13 DIAGNOSIS — Z970 Presence of artificial eye: Secondary | ICD-10-CM

## 2022-06-13 DIAGNOSIS — C661 Malignant neoplasm of right ureter: Secondary | ICD-10-CM | POA: Diagnosis not present

## 2022-06-13 DIAGNOSIS — E876 Hypokalemia: Secondary | ICD-10-CM | POA: Diagnosis not present

## 2022-06-13 DIAGNOSIS — Z9001 Acquired absence of eye: Secondary | ICD-10-CM

## 2022-06-13 DIAGNOSIS — I10 Essential (primary) hypertension: Secondary | ICD-10-CM | POA: Diagnosis present

## 2022-06-13 DIAGNOSIS — K7689 Other specified diseases of liver: Secondary | ICD-10-CM | POA: Diagnosis not present

## 2022-06-13 DIAGNOSIS — R1010 Upper abdominal pain, unspecified: Secondary | ICD-10-CM

## 2022-06-13 DIAGNOSIS — R6251 Failure to thrive (child): Secondary | ICD-10-CM | POA: Diagnosis not present

## 2022-06-13 DIAGNOSIS — R1310 Dysphagia, unspecified: Secondary | ICD-10-CM | POA: Diagnosis present

## 2022-06-13 DIAGNOSIS — C651 Malignant neoplasm of right renal pelvis: Secondary | ICD-10-CM | POA: Diagnosis present

## 2022-06-13 DIAGNOSIS — C689 Malignant neoplasm of urinary organ, unspecified: Secondary | ICD-10-CM | POA: Diagnosis not present

## 2022-06-13 DIAGNOSIS — E8809 Other disorders of plasma-protein metabolism, not elsewhere classified: Secondary | ICD-10-CM | POA: Diagnosis not present

## 2022-06-13 DIAGNOSIS — Z79899 Other long term (current) drug therapy: Secondary | ICD-10-CM

## 2022-06-13 DIAGNOSIS — S22000A Wedge compression fracture of unspecified thoracic vertebra, initial encounter for closed fracture: Secondary | ICD-10-CM | POA: Diagnosis not present

## 2022-06-13 DIAGNOSIS — Z7982 Long term (current) use of aspirin: Secondary | ICD-10-CM

## 2022-06-13 DIAGNOSIS — Z7984 Long term (current) use of oral hypoglycemic drugs: Secondary | ICD-10-CM

## 2022-06-13 DIAGNOSIS — E1122 Type 2 diabetes mellitus with diabetic chronic kidney disease: Secondary | ICD-10-CM | POA: Diagnosis not present

## 2022-06-13 DIAGNOSIS — R64 Cachexia: Secondary | ICD-10-CM | POA: Diagnosis present

## 2022-06-13 DIAGNOSIS — C799 Secondary malignant neoplasm of unspecified site: Secondary | ICD-10-CM | POA: Diagnosis not present

## 2022-06-13 DIAGNOSIS — Z885 Allergy status to narcotic agent status: Secondary | ICD-10-CM

## 2022-06-13 DIAGNOSIS — R630 Anorexia: Secondary | ICD-10-CM | POA: Diagnosis not present

## 2022-06-13 DIAGNOSIS — C680 Malignant neoplasm of urethra: Secondary | ICD-10-CM | POA: Diagnosis not present

## 2022-06-13 DIAGNOSIS — Z9049 Acquired absence of other specified parts of digestive tract: Secondary | ICD-10-CM | POA: Diagnosis not present

## 2022-06-13 DIAGNOSIS — M6281 Muscle weakness (generalized): Secondary | ICD-10-CM | POA: Diagnosis not present

## 2022-06-13 DIAGNOSIS — K5900 Constipation, unspecified: Secondary | ICD-10-CM

## 2022-06-13 DIAGNOSIS — E119 Type 2 diabetes mellitus without complications: Secondary | ICD-10-CM | POA: Diagnosis not present

## 2022-06-13 DIAGNOSIS — Z9079 Acquired absence of other genital organ(s): Secondary | ICD-10-CM

## 2022-06-13 DIAGNOSIS — C7910 Secondary malignant neoplasm of unspecified urinary organs: Secondary | ICD-10-CM | POA: Diagnosis not present

## 2022-06-13 DIAGNOSIS — S32000A Wedge compression fracture of unspecified lumbar vertebra, initial encounter for closed fracture: Secondary | ICD-10-CM | POA: Diagnosis not present

## 2022-06-13 LAB — COMPREHENSIVE METABOLIC PANEL
ALT: 15 U/L (ref 0–44)
AST: 14 U/L — ABNORMAL LOW (ref 15–41)
Albumin: 2.5 g/dL — ABNORMAL LOW (ref 3.5–5.0)
Alkaline Phosphatase: 133 U/L — ABNORMAL HIGH (ref 38–126)
Anion gap: 9 (ref 5–15)
BUN: 35 mg/dL — ABNORMAL HIGH (ref 8–23)
CO2: 23 mmol/L (ref 22–32)
Calcium: 9.2 mg/dL (ref 8.9–10.3)
Chloride: 103 mmol/L (ref 98–111)
Creatinine, Ser: 1.16 mg/dL (ref 0.61–1.24)
GFR, Estimated: >60 mL/min (ref >60)
Glucose, Bld: 273 mg/dL — ABNORMAL HIGH (ref 70–99)
Potassium: 3.3 mmol/L — ABNORMAL LOW (ref 3.5–5.1)
Sodium: 135 mmol/L (ref 135–145)
Total Bilirubin: 0.5 mg/dL (ref 0.3–1.2)
Total Protein: 7 g/dL (ref 6.5–8.1)

## 2022-06-13 LAB — CBC WITH DIFFERENTIAL/PLATELET
Abs Immature Granulocytes: 0.11 10*3/uL — ABNORMAL HIGH (ref 0.00–0.07)
Basophils Absolute: 0 10*3/uL (ref 0.0–0.1)
Basophils Relative: 0 %
Eosinophils Absolute: 0.2 10*3/uL (ref 0.0–0.5)
Eosinophils Relative: 1 %
HCT: 28.4 % — ABNORMAL LOW (ref 39.0–52.0)
Hemoglobin: 8.8 g/dL — ABNORMAL LOW (ref 13.0–17.0)
Immature Granulocytes: 1 %
Lymphocytes Relative: 12 %
Lymphs Abs: 1.3 10*3/uL (ref 0.7–4.0)
MCH: 25.1 pg — ABNORMAL LOW (ref 26.0–34.0)
MCHC: 31 g/dL (ref 30.0–36.0)
MCV: 81.1 fL (ref 80.0–100.0)
Monocytes Absolute: 0.6 10*3/uL (ref 0.1–1.0)
Monocytes Relative: 6 %
Neutro Abs: 8.4 10*3/uL — ABNORMAL HIGH (ref 1.7–7.7)
Neutrophils Relative %: 80 %
Platelets: 527 10*3/uL — ABNORMAL HIGH (ref 150–400)
RBC: 3.5 MIL/uL — ABNORMAL LOW (ref 4.22–5.81)
RDW: 14.7 % (ref 11.5–15.5)
WBC: 10.6 10*3/uL — ABNORMAL HIGH (ref 4.0–10.5)
nRBC: 0 % (ref 0.0–0.2)

## 2022-06-13 LAB — URINALYSIS, ROUTINE W REFLEX MICROSCOPIC
Bacteria, UA: NONE SEEN
Bilirubin Urine: NEGATIVE
Glucose, UA: NEGATIVE mg/dL
Hgb urine dipstick: NEGATIVE
Ketones, ur: NEGATIVE mg/dL
Leukocytes,Ua: NEGATIVE
Nitrite: NEGATIVE
Protein, ur: 30 mg/dL — AB
Specific Gravity, Urine: 1.032 — ABNORMAL HIGH (ref 1.005–1.030)
pH: 5 (ref 5.0–8.0)

## 2022-06-13 LAB — LACTIC ACID, PLASMA
Lactic Acid, Venous: 1.1 mmol/L (ref 0.5–1.9)
Lactic Acid, Venous: 1 mmol/L (ref 0.5–1.9)

## 2022-06-13 LAB — LIPASE, BLOOD: Lipase: 26 U/L (ref 11–51)

## 2022-06-13 LAB — PHOSPHORUS: Phosphorus: 3 mg/dL (ref 2.5–4.6)

## 2022-06-13 LAB — MAGNESIUM: Magnesium: 1.8 mg/dL (ref 1.7–2.4)

## 2022-06-13 MED ORDER — IOHEXOL 300 MG/ML  SOLN
100.0000 mL | Freq: Once | INTRAMUSCULAR | Status: AC | PRN
  Administered 2022-06-13: 100 mL via INTRAVENOUS

## 2022-06-13 MED ORDER — SODIUM CHLORIDE 0.9 % IV SOLN: INTRAVENOUS | Status: AC

## 2022-06-13 MED ORDER — LACTATED RINGERS IV BOLUS
1000.0000 mL | Freq: Once | INTRAVENOUS | Status: AC
  Administered 2022-06-13: 1000 mL via INTRAVENOUS

## 2022-06-13 MED ORDER — HEPARIN SODIUM (PORCINE) 5000 UNIT/ML IJ SOLN
5000.0000 [IU] | Freq: Three times a day (TID) | INTRAMUSCULAR | Status: DC
  Administered 2022-06-13 – 2022-06-14 (×3): 5000 [IU] via SUBCUTANEOUS
  Filled 2022-06-13 (×3): qty 1

## 2022-06-13 MED ORDER — INSULIN GLARGINE-YFGN 100 UNIT/ML ~~LOC~~ SOLN
5.0000 [IU] | Freq: Every day | SUBCUTANEOUS | Status: DC
  Administered 2022-06-13: 5 [IU] via SUBCUTANEOUS
  Filled 2022-06-13 (×2): qty 0.05

## 2022-06-13 MED ORDER — HYDROMORPHONE HCL 1 MG/ML IJ SOLN
0.5000 mg | INTRAMUSCULAR | Status: DC | PRN
  Administered 2022-06-13 – 2022-06-14 (×5): 0.5 mg via INTRAVENOUS
  Filled 2022-06-13 (×5): qty 0.5

## 2022-06-13 MED ORDER — INSULIN ASPART 100 UNIT/ML IJ SOLN
0.0000 [IU] | Freq: Every day | INTRAMUSCULAR | Status: DC
  Administered 2022-06-14 – 2022-06-18 (×3): 2 [IU] via SUBCUTANEOUS

## 2022-06-13 MED ORDER — INSULIN ASPART 100 UNIT/ML IJ SOLN
0.0000 [IU] | Freq: Three times a day (TID) | INTRAMUSCULAR | Status: DC
  Administered 2022-06-14: 2 [IU] via SUBCUTANEOUS
  Administered 2022-06-14: 3 [IU] via SUBCUTANEOUS
  Administered 2022-06-14: 2 [IU] via SUBCUTANEOUS
  Administered 2022-06-15 – 2022-06-16 (×4): 1 [IU] via SUBCUTANEOUS
  Administered 2022-06-17 – 2022-06-18 (×5): 2 [IU] via SUBCUTANEOUS
  Administered 2022-06-19: 3 [IU] via SUBCUTANEOUS
  Administered 2022-06-19: 2 [IU] via SUBCUTANEOUS
  Administered 2022-06-19: 3 [IU] via SUBCUTANEOUS
  Administered 2022-06-20: 2 [IU] via SUBCUTANEOUS

## 2022-06-13 MED ORDER — ONDANSETRON HCL 4 MG PO TABS
4.0000 mg | ORAL_TABLET | Freq: Four times a day (QID) | ORAL | Status: DC | PRN
  Administered 2022-06-15 – 2022-06-17 (×5): 4 mg via ORAL
  Filled 2022-06-13 (×5): qty 1

## 2022-06-13 MED ORDER — POTASSIUM CHLORIDE 20 MEQ PO PACK
40.0000 meq | PACK | Freq: Once | ORAL | Status: AC
Start: 2022-06-13 — End: 2022-06-13
  Administered 2022-06-13: 40 meq via ORAL
  Filled 2022-06-13: qty 2

## 2022-06-13 MED ORDER — ONDANSETRON HCL 4 MG/2ML IJ SOLN
4.0000 mg | Freq: Four times a day (QID) | INTRAMUSCULAR | Status: DC | PRN
  Administered 2022-06-13 – 2022-06-21 (×4): 4 mg via INTRAVENOUS
  Filled 2022-06-13 (×4): qty 2

## 2022-06-13 MED ORDER — GLUCERNA SHAKE PO LIQD
237.0000 mL | Freq: Three times a day (TID) | ORAL | Status: DC
  Administered 2022-06-14 – 2022-06-15 (×4): 237 mL via ORAL
  Filled 2022-06-13 (×6): qty 237

## 2022-06-13 MED ORDER — HYDROMORPHONE HCL 1 MG/ML IJ SOLN
0.5000 mg | Freq: Once | INTRAMUSCULAR | Status: AC
  Administered 2022-06-13: 0.5 mg via INTRAVENOUS
  Filled 2022-06-13: qty 1

## 2022-06-13 MED ORDER — AMLODIPINE BESYLATE 10 MG PO TABS
10.0000 mg | ORAL_TABLET | Freq: Every day | ORAL | Status: DC
  Administered 2022-06-14 – 2022-06-19 (×7): 10 mg via ORAL
  Filled 2022-06-13 (×7): qty 1

## 2022-06-13 MED ORDER — MEGESTROL ACETATE 40 MG/ML PO SUSP
400.0000 mg | Freq: Every day | ORAL | Status: DC
  Administered 2022-06-14: 400 mg via ORAL
  Filled 2022-06-13: qty 10

## 2022-06-13 MED ORDER — SIMVASTATIN 20 MG PO TABS
20.0000 mg | ORAL_TABLET | Freq: Every day | ORAL | Status: DC
  Administered 2022-06-14 – 2022-06-15 (×3): 20 mg via ORAL
  Filled 2022-06-13 (×3): qty 1

## 2022-06-13 MED ORDER — HYDROMORPHONE HCL 1 MG/ML IJ SOLN
1.0000 mg | Freq: Once | INTRAMUSCULAR | Status: AC
  Administered 2022-06-13: 1 mg via INTRAVENOUS
  Filled 2022-06-13: qty 1

## 2022-06-13 MED ORDER — LISINOPRIL 20 MG PO TABS
40.0000 mg | ORAL_TABLET | Freq: Every day | ORAL | Status: DC
  Administered 2022-06-14 – 2022-06-19 (×7): 40 mg via ORAL
  Filled 2022-06-13 (×7): qty 2

## 2022-06-13 MED ORDER — PANTOPRAZOLE SODIUM 40 MG PO TBEC
40.0000 mg | DELAYED_RELEASE_TABLET | Freq: Every day | ORAL | Status: DC
  Administered 2022-06-14 – 2022-06-19 (×6): 40 mg via ORAL
  Filled 2022-06-13 (×8): qty 1

## 2022-06-13 NOTE — ED Notes (Signed)
RN called report to nurse of floor, [place]

## 2022-06-13 NOTE — ED Provider Notes (Signed)
Care assumed from Dr. Maudie Mercury.  Patient with a history of GU carcinoma, recent cholecystectomy 8/30 here with anorexia, fatigue, abdominal pain and weight loss.  Labs are reassuring.  He is pending CT scan and will likely need admission.  CT shows: IMPRESSION: 1. Evidence of prior cholecystectomy. 2. Additional findings that may represent a large postoperative fluid collection versus biloma extending into the anterolateral aspect of the right lobe of the liver. 3. Enlarging 5.4 cm x 4.7 cm x 8.0 cm necrotic soft tissue mass anterior to the abdominal aorta, concerning for the presence of an underlying neoplastic process. Further evaluation with tissue sampling and nuclear medicine PET/CT is recommended. 4. Colonic diverticulosis. 5. Large amount of impacted stool within the distal sigmoid colon and rectum. 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 8. Aortic atherosclerosis.  Discussed with Dr. Sherrye Payor of radiology.  There is a large fluid collection at the prior site of the gallbladder.  There is an enlarging soft tissue necrotic mass anterior to the abdominal aorta which is concerning for neoplasm is much larger than it was in June.  Discussed with Dr. Brantley Stage of general surgery.  He will evaluate patient tomorrow.  Recommends likely IR drainage of the fluid collection.  Agrees with hospitalist admission.  Pilar Plate discussion with patient that his cancer appears to be getting worse.  He is asking "how much time do I have".  Discussed with him that I cannot answer this question but would recommend palliative care discussion while he was hospitalized.  Discussed with patient as above.  Vital signs remained stable.  He is requesting additional pain control.  Plan to admit to the hospital for surgery, IR and oncology evaluation.  Will likely need palliative care involvement as well.  Admission discussed with Dr. Josephine Cables.      Ezequiel Essex, MD 06/13/22  [phone]
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
## 2022-06-13 NOTE — Progress Notes (Signed)
Chaplain was paged to provide support to Hector Lopez after receiving a new diagnosis.  Chaplain provided listening and emotional support and will return tomorrow when his wife will be present.  At this time, he wants to rest.  [address], [place] Pager, [phone]

## 2022-06-13 NOTE — ED Notes (Addendum)
RN introduced self to patient. Emotional support provided as patient is processing results of scan. Pt states that "if he can get this pain under control, then he can make some better decisions." RN requested more pain meds from EDP. Pt's pain 7/10 after dose of hydromorphone.

## 2022-06-13 NOTE — ED Triage Notes (Signed)
Pt reports gallbladder surgery approximately 3 weeks ago.  Continued abdominal pain. No n/v/d however wife and pt state he has not eaten or drank hardly anything in weeks.  Pt's PCP advised coming to the ED.  Pt is ill-appearing at time of triage.  Lethargic and has a hard time speaking loudly enough to be heard.

## 2022-06-13 NOTE — ED Notes (Addendum)
Pt's wife and dtr at bedside. Pt is sharing with them news of scan. Family has questions. RN asked EDP to discuss with them details.
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
## 2022-06-14 ENCOUNTER — Inpatient Hospital Stay (HOSPITAL_COMMUNITY): Payer: Medicare Other

## 2022-06-14 DIAGNOSIS — R627 Adult failure to thrive: Secondary | ICD-10-CM | POA: Diagnosis not present

## 2022-06-14 DIAGNOSIS — Z7189 Other specified counseling: Secondary | ICD-10-CM

## 2022-06-14 DIAGNOSIS — R634 Abnormal weight loss: Secondary | ICD-10-CM | POA: Diagnosis not present

## 2022-06-14 DIAGNOSIS — K5900 Constipation, unspecified: Secondary | ICD-10-CM

## 2022-06-14 DIAGNOSIS — Z515 Encounter for palliative care: Secondary | ICD-10-CM

## 2022-06-14 DIAGNOSIS — C799 Secondary malignant neoplasm of unspecified site: Secondary | ICD-10-CM | POA: Diagnosis not present

## 2022-06-14 DIAGNOSIS — R52 Pain, unspecified: Secondary | ICD-10-CM

## 2022-06-14 DIAGNOSIS — C689 Malignant neoplasm of urinary organ, unspecified: Secondary | ICD-10-CM

## 2022-06-14 DIAGNOSIS — K668 Other specified disorders of peritoneum: Secondary | ICD-10-CM | POA: Diagnosis not present

## 2022-06-14 DIAGNOSIS — R1010 Upper abdominal pain, unspecified: Secondary | ICD-10-CM | POA: Diagnosis not present

## 2022-06-14 DIAGNOSIS — R531 Weakness: Secondary | ICD-10-CM | POA: Diagnosis not present

## 2022-06-14 DIAGNOSIS — R630 Anorexia: Secondary | ICD-10-CM

## 2022-06-14 LAB — GLUCOSE, CAPILLARY
Glucose-Capillary: 209 mg/dL — ABNORMAL HIGH (ref 70–99)
Glucose-Capillary: 171 mg/dL — ABNORMAL HIGH (ref 70–99)
Glucose-Capillary: 165 mg/dL — ABNORMAL HIGH (ref 70–99)
Glucose-Capillary: 227 mg/dL — ABNORMAL HIGH (ref 70–99)

## 2022-06-14 LAB — COMPREHENSIVE METABOLIC PANEL
ALT: 12 U/L (ref 0–44)
AST: 11 U/L — ABNORMAL LOW (ref 15–41)
Albumin: 2.6 g/dL — ABNORMAL LOW (ref 3.5–5.0)
Alkaline Phosphatase: 126 U/L (ref 38–126)
Anion gap: 12 (ref 5–15)
BUN: 21 mg/dL (ref 8–23)
CO2: 22 mmol/L (ref 22–32)
Calcium: 9.2 mg/dL (ref 8.9–10.3)
Chloride: 103 mmol/L (ref 98–111)
Creatinine, Ser: 0.88 mg/dL (ref 0.61–1.24)
GFR, Estimated: >60 mL/min (ref >60)
Glucose, Bld: 207 mg/dL — ABNORMAL HIGH (ref 70–99)
Potassium: 3.7 mmol/L (ref 3.5–5.1)
Sodium: 137 mmol/L (ref 135–145)
Total Bilirubin: 0.7 mg/dL (ref 0.3–1.2)
Total Protein: 7 g/dL (ref 6.5–8.1)

## 2022-06-14 LAB — APTT: aPTT: 31 seconds (ref 24–36)

## 2022-06-14 LAB — CBC
HCT: 28.3 % — ABNORMAL LOW (ref 39.0–52.0)
Hemoglobin: 8.5 g/dL — ABNORMAL LOW (ref 13.0–17.0)
MCH: 25.1 pg — ABNORMAL LOW (ref 26.0–34.0)
MCHC: 30 g/dL (ref 30.0–36.0)
MCV: 83.5 fL (ref 80.0–100.0)
Platelets: 531 10*3/uL — ABNORMAL HIGH (ref 150–400)
RBC: 3.39 MIL/uL — ABNORMAL LOW (ref 4.22–5.81)
RDW: 15 % (ref 11.5–15.5)
WBC: 14.8 10*3/uL — ABNORMAL HIGH (ref 4.0–10.5)
nRBC: 0 % (ref 0.0–0.2)

## 2022-06-14 LAB — MAGNESIUM: Magnesium: 1.6 mg/dL — ABNORMAL LOW (ref 1.7–2.4)

## 2022-06-14 LAB — PHOSPHORUS: Phosphorus: 3 mg/dL (ref 2.5–4.6)

## 2022-06-14 MED ORDER — INSULIN GLARGINE-YFGN 100 UNIT/ML ~~LOC~~ SOLN
10.0000 [IU] | Freq: Every day | SUBCUTANEOUS | Status: DC
  Administered 2022-06-14 – 2022-06-21 (×7): 10 [IU] via SUBCUTANEOUS
  Filled 2022-06-14 (×10): qty 0.1

## 2022-06-14 MED ORDER — TRAZODONE HCL 50 MG PO TABS
50.0000 mg | ORAL_TABLET | Freq: Once | ORAL | Status: AC
  Administered 2022-06-14: 50 mg via ORAL
  Filled 2022-06-14: qty 1

## 2022-06-14 MED ORDER — ALUM & MAG HYDROXIDE-SIMETH 200-200-20 MG/5ML PO SUSP
30.0000 mL | ORAL | Status: DC | PRN
  Administered 2022-06-14 (×2): 30 mL via ORAL
  Filled 2022-06-14 (×2): qty 30

## 2022-06-14 MED ORDER — ACETAMINOPHEN 500 MG PO TABS
1000.0000 mg | ORAL_TABLET | Freq: Three times a day (TID) | ORAL | Status: DC
  Administered 2022-06-14 – 2022-06-21 (×18): 1000 mg via ORAL
  Filled 2022-06-14 (×20): qty 2

## 2022-06-14 MED ORDER — ENOXAPARIN SODIUM 40 MG/0.4ML IJ SOSY
40.0000 mg | PREFILLED_SYRINGE | INTRAMUSCULAR | Status: DC
  Administered 2022-06-14 – 2022-06-19 (×6): 40 mg via SUBCUTANEOUS
  Filled 2022-06-14 (×6): qty 0.4

## 2022-06-14 MED ORDER — HYDROMORPHONE HCL 1 MG/ML IJ SOLN
0.5000 mg | INTRAMUSCULAR | Status: DC | PRN
  Administered 2022-06-15 – 2022-06-16 (×5): 0.5 mg via INTRAVENOUS
  Filled 2022-06-14 (×6): qty 0.5

## 2022-06-14 MED ORDER — SODIUM CHLORIDE 0.9 % IV SOLN: INTRAVENOUS | Status: DC

## 2022-06-14 MED ORDER — POLYETHYLENE GLYCOL 3350 17 G PO PACK: 17.0000 g | PACK | Freq: Every day | ORAL | Status: DC

## 2022-06-14 MED ORDER — SENNOSIDES-DOCUSATE SODIUM 8.6-50 MG PO TABS
1.0000 | ORAL_TABLET | Freq: Two times a day (BID) | ORAL | Status: DC
  Administered 2022-06-14 – 2022-06-16 (×5): 1 via ORAL
  Filled 2022-06-14 (×5): qty 1

## 2022-06-14 MED ORDER — MAGNESIUM SULFATE 4 GM/100ML IV SOLN
4.0000 g | Freq: Once | INTRAVENOUS | Status: AC
  Administered 2022-06-14: 4 g via INTRAVENOUS
  Filled 2022-06-14: qty 100

## 2022-06-14 MED ORDER — HYDROMORPHONE HCL 2 MG PO TABS
2.0000 mg | ORAL_TABLET | ORAL | Status: DC | PRN
  Administered 2022-06-14 – 2022-06-16 (×7): 2 mg via ORAL
  Filled 2022-06-14 (×9): qty 1

## 2022-06-14 MED ORDER — SORBITOL 70 % SOLN
960.0000 mL | TOPICAL_OIL | Freq: Once | ORAL | Status: AC
  Administered 2022-06-14: 960 mL via RECTAL
  Filled 2022-06-14: qty 240

## 2022-06-14 MED ORDER — POLYETHYLENE GLYCOL 3350 17 G PO PACK
17.0000 g | PACK | Freq: Two times a day (BID) | ORAL | Status: DC
  Administered 2022-06-14 – 2022-06-23 (×11): 17 g via ORAL
  Filled 2022-06-14 (×16): qty 1

## 2022-06-14 MED ORDER — METHOCARBAMOL 500 MG PO TABS
500.0000 mg | ORAL_TABLET | Freq: Once | ORAL | Status: AC
  Administered 2022-06-14: 500 mg via ORAL
  Filled 2022-06-14: qty 1

## 2022-06-14 NOTE — Progress Notes (Signed)
PROGRESS NOTE    Wade Asebedo  HYQ:[id number] DOB: 07-06-1946 DOA: 06/13/2022 PCP: [name], MD    Chief Complaint  Patient presents with   Failure To Thrive    Brief Narrative:  Patient 76 year old gentleman history of laparoscopic cholecystectomy, stage IV urothelial cancer, GERD, diabetes mellitus type 2 presented to the ED with abdominal pain, failure to thrive.  CT showing possible fluid collection in the gallbladder fossa.  CT also consistent with enlarging necrotic soft tissue mass anterior to the abdominal aorta concerning for underlying neoplastic process, large amount of impacted stool within the distal sigmoid colon and rectum.  Patient placed on IV pain medication, IV fluids, electrolyte repletion.  ED contacted patient's primary oncologist Dr. Alen Blew who recommended admission for further inpatient management due to p.o. intolerance and anorexia.  General surgery consulted.   Assessment & Plan:   Principal Problem:   Generalized weakness Active Problems:   Essential hypertension   Abdominal pain   Mixed hyperlipidemia   Failure to thrive in adult   Loss of weight   Anorexia   Hypoalbuminemia due to protein-calorie malnutrition (HCC)   Hypokalemia   Dehydration   Palliative care encounter   ACP (advance care planning)   Urothelial cancer ([place])   Metastatic malignant neoplasm (HCC)   Intractable pain   Appetite loss   Goals of care, counseling/discussion   Constipation   Biloma   Hypomagnesemia  #1 generalized weakness/failure to thrive in adult/hypoalbuminemia/moderate protein calorie malnutrition -Likely secondary to stage IV urothelial cancer. -Patient also noted to be constipated has not had a bowel movement in 5 weeks. -Patient noted with 30 pound weight loss over the past 3 months with poor appetite due to food not tasting well. -Patient placed on nutritional supplementation, dietitian to be consulted. -Palliative care consulted.  2.  Abdominal  pain/abdominal mass -CT abdomen and pelvis with enlarging 5.4 x 4.7 x 8 cm necrotic soft tissue mass anterior to the abdominal aorta concerning for presence of underlying neoplastic process. -Patient also constipated, stated has not had a bowel movement in 5 weeks, CT abdomen and pelvis done with large amount of impacted stool within the distal sigmoid colon and rectum. - SMOG enema. -Was noted per admitting physician to ED already consulted Dr. Alen Blew. -Hematology/oncology following. -Palliative care following for symptom management and pain medications being adjusted by palliative care.  3.  Constipation -Patient states no bowel movement in 5 weeks. -CT abdomen and pelvis with large amount of impacted stool within the distal sigmoid colon and rectum. -Smog enema. -Placed on Senokot-S twice daily.  4.  Stage IV urothelial cancer -Oncology informed of admission. -Palliative care consulted.  5. ?  Large postop fluid collection versus biloma -Noted on CT abdomen and pelvis. -Patient seen by general surgery who doubt patient has a biloma, HIDA scan ordered. -Patient started on a diet per general surgery. -Per general surgery.  6.  Dehydration -IV fluids.  7.  Hyperlipidemia -Continue statin.  8.  Type 2 diabetes mellitus with uncontrolled hyperglycemia -Hemoglobin A1c 10.8 (03/29/2022) -CBG 209 this morning. -Increase Semglee to 10 units daily. -SSI.  9.  Hypokalemia/hypomagnesemia -Magnesium at 1.6.  Potassium at 3.7. -Magnesium sulfate 4 g IV x1.   -Repeat labs in AM.   10.  GERD PPI.  11.  Hypertension -Continue home regimen Norvasc, lisinopril.    DVT prophylaxis: Heparin Code Status: Full Family Communication: Updated patient.  No family at bedside. Disposition: Likely home with home health when clinically improved versus SNF  Status is: Inpatient Remains inpatient appropriate because: Severity of illness   Consultants:  General surgery: Dr. Brantley Stage  06/13/2022 Palliative care: Dr. Vinetta Bergamo 06/14/2022  Procedures:  CT abdomen and pelvis 06/13/2022 HIDA scan pending  Antimicrobials:  None   Subjective: Patient sitting up in bed.  Denies any chest pain.  No shortness of breath.  States he feels horrible.  Complains of abdominal discomfort.  States has not had a bowel movement in 5 weeks.  Objective: Vitals:   06/14/22 0133 06/14/22 0516 06/14/22 0935 06/14/22 1430  BP:  (!) 178/85 (!) 159/86 (!) 148/74  Pulse:  97 (!) 108 (!) 107  Resp:  '16 18 18  '$ Temp:  98.1 F (36.7 C) 98 F (36.7 C) 98.1 F (36.7 C)  TempSrc:  Oral Oral Oral  SpO2:  96% 97% 98%  Weight: 63 kg     Height: '5\' 10"'$  (1.778 m)       Intake/Output Summary (Last 24 hours) at 06/14/2022 1622 Last data filed at 06/14/2022 1230 Gross per 24 hour  Intake 444.75 ml  Output 1 ml  Net 443.75 ml   Filed Weights   06/13/22 1638 06/14/22 0133  Weight: 63.2 kg 63 kg    Examination:  General exam: Appears calm and comfortable  Respiratory system: Clear to auscultation. Respiratory effort normal. Cardiovascular system: S1 & S2 heard, RRR. No JVD, murmurs, rubs, gallops or clicks. No pedal edema. Gastrointestinal system: Abdomen is nondistended, soft and nontender. No organomegaly or masses felt. Normal bowel sounds heard. Central nervous system: Alert and oriented. No focal neurological deficits. Extremities: Symmetric 5 x 5 power. Skin: No rashes, lesions or ulcers Psychiatry: Judgement and insight appear normal. Mood & affect appropriate.     Data Reviewed: I have personally reviewed following labs and imaging studies  CBC: Recent Labs  Lab 06/13/22 1600 06/14/22 0630  WBC 10.6* 14.8*  NEUTROABS 8.4*  --   HGB 8.8* 8.5*  HCT 28.4* 28.3*  MCV 81.1 83.5  PLT 527* 531*    Basic Metabolic Panel: Recent Labs  Lab 06/13/22 1600 06/14/22 0630  NA 135 137  K 3.3* 3.7  CL 103 103  CO2 23 22  GLUCOSE 273* 207*  BUN 35* 21  CREATININE 1.16 0.88   CALCIUM 9.2 9.2  MG 1.8 1.6*  PHOS 3.0 3.0    GFR: Estimated Creatinine Clearance: 63.6 mL/min (by C-G formula based on SCr of 0.88 mg/dL).  Liver Function Tests: Recent Labs  Lab 06/13/22 1600 06/14/22 0630  AST 14* 11*  ALT 15 12  ALKPHOS 133* 126  BILITOT 0.5 0.7  PROT 7.0 7.0  ALBUMIN 2.5* 2.6*    CBG: Recent Labs  Lab 06/14/22 0729 06/14/22 1214 06/14/22 1601  GLUCAP 209* 171* 165*     No results found for this or any previous visit (from the past 240 hour(s)).       Radiology Studies: CT Abdomen Pelvis W Contrast  Result Date: 06/13/2022 CLINICAL DATA:  Status post gallbladder surgery 3 weeks ago with continued abdominal pain. EXAM: CT ABDOMEN AND PELVIS WITH CONTRAST TECHNIQUE: Multidetector CT imaging of the abdomen and pelvis was performed using the standard protocol following bolus administration of intravenous contrast. RADIATION DOSE REDUCTION: This exam was performed according to the departmental dose-optimization program which includes automated exposure control, adjustment of the mA and/or kV according to patient size and/or use of iterative reconstruction technique. CONTRAST:  181m OMNIPAQUE IOHEXOL 300 MG/ML  SOLN COMPARISON:  March 12, 2022 FINDINGS: Lower chest:  No acute abnormality. Hepatobiliary: Stable, adjacent 8 mm and 9 mm foci of parenchymal low attenuation are seen within the anterolateral aspect of the right lobe of the liver. A 5.7 cm x 3.5 cm x 2.9 cm well-defined area of low attenuation (approximately 3.58 Hounsfield units) is seen extending from the inferolateral aspect of the gallbladder fossa along the anterolateral aspect of the right lobe of the liver. Multiple surgical clips are seen within the gallbladder fossa. The common bile duct measures 9 mm in diameter (coronal reformatted image 58, CT series 4). A 2.6 mm calcification is adjacent to the distal aspect of the common bile duct (axial CT image 35, CT series 2) and is located within  the pancreatic head. 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 Pancreas: There is diffuse atrophy of the pancreatic parenchyma with numerous subcentimeter pancreatic parenchymal calcifications noted Spleen: Normal in size without focal abnormality. Adrenals/Urinary Tract: The right adrenal gland is not clearly identified. The left adrenal gland is unremarkable. 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 The left kidney is normal in size, without renal calculi or hydronephrosis. Subcentimeter cysts are seen within the posterior aspect of the mid left kidney. Bladder is unremarkable. Stomach/Bowel: Stomach is within normal limits. The appendix is not identified. A large amount of stool is seen within the distal sigmoid colon and rectum. No evidence of bowel wall thickening, distention, or inflammatory changes. Noninflamed diverticula are seen throughout the descending and sigmoid colon. Vascular/Lymphatic: Aortic atherosclerosis. A 5.4 cm x 4.7 cm x 8.0 cm necrotic soft tissue mass is seen anterior to the abdominal aorta. This is at the level of the left kidney and measures 2.3 cm x 2.5 cm on the prior exam. Reproductive: Prostate is unremarkable. Other: No abdominal wall hernia or abnormality. No abdominopelvic ascites. Musculoskeletal: Multilevel degenerative changes seen throughout the lumbar spine. IMPRESSION: 1. Evidence of prior cholecystectomy. 2. Additional findings that may represent a large postoperative fluid collection versus biloma extending into the anterolateral aspect of the right lobe of the liver. 3. Enlarging 5.4 cm x 4.7 cm x 8.0 cm necrotic soft tissue mass anterior to the abdominal aorta, concerning for the presence of an underlying neoplastic process. Further evaluation with tissue sampling and nuclear medicine PET/CT is recommended. 4. Colonic diverticulosis. 5. Large amount of impacted stool within the distal sigmoid colon and rectum. 6. 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 8. Aortic atherosclerosis. Electronically Signed   By: Virgina Norfolk M.D.   On: 06/13/2022 17:49        Scheduled Meds:  acetaminophen  1,000 mg Oral TID   amLODipine  10 mg Oral Q1500   feeding supplement (GLUCERNA SHAKE)  237 mL Oral TID BM   heparin  5,000 Units Subcutaneous Q8H   insulin aspart  0-5 Units Subcutaneous QHS   insulin aspart  0-9 Units Subcutaneous TID WC   insulin glargine-yfgn  10 Units Subcutaneous QHS   lisinopril  40 mg Oral Q1500   pantoprazole  40 mg Oral Daily   [START ON 06/15/2022] polyethylene glycol  17 g Oral Daily   senna-docusate  1 tablet Oral BID   simvastatin  20 mg Oral QHS   Continuous Infusions:  sodium chloride       LOS: 1 day    Time spent: 40 minutes    Irine Seal, MD [facility]   To contact the attending provider between 7A-7P or the covering provider during after hours  7P-7A, please log into the web site [email or web address] and access using universal [place] password for that web site. If you do not have the password, please call the hospital operator.  06/14/2022, 4:22 PM

## 2022-06-14 NOTE — Progress Notes (Signed)
Pt refused to go down for chest xray. Dr Grandville Silos notified.
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
## 2022-06-14 NOTE — Progress Notes (Signed)
Subjective/Chief Complaint: Patient feels about the same.  Significant weakness and back pain.   Objective: Vital signs in last 24 hours: Temp:  [98 F (36.7 C)-99.2 F (37.3 C)] 98.1 F (36.7 C) (09/30 0516) Pulse Rate:  [73-97] 97 (09/30 0516) Resp:  [16-18] 16 (09/30 0516) BP: (117-180)/(73-104) 178/85 (09/30 0516) SpO2:  [96 %-100 %] 96 % (09/30 0516) Weight:  [63 kg-63.2 kg] 63 kg (09/30 0133) Last BM Date : 05/14/22 (PER PATIENT)  Intake/Output from previous day: 09/29 0701 - 09/30 0700 In: 444.8 [P.O.:120; I.V.:324.8] Out: -  Intake/Output this shift: No intake/output data recorded.  General appearance: fatigued, slowed mentation, and syndromic appearance - cachectic Incision/Wound: Abdomen soft.  No rebound or guarding.  No signs of peritonitis.  Complains of diffuse tenderness.  Lab Results:  Recent Labs    06/13/22 1600 06/14/22 0630  WBC 10.6* 14.8*  HGB 8.8* 8.5*  HCT 28.4* 28.3*  PLT 527* 531*   BMET Recent Labs    06/13/22 1600 06/14/22 0630  NA 135 137  K 3.3* 3.7  CL 103 103  CO2 23 22  GLUCOSE 273* 207*  BUN 35* 21  CREATININE 1.16 0.88  CALCIUM 9.2 9.2   PT/INR No results for input(s): "LABPROT", "INR" in the last 72 hours. ABG No results for input(s): "PHART", "HCO3" in the last 72 hours.  Invalid input(s): "PCO2", "PO2"  Studies/Results: CT Abdomen Pelvis W Contrast  Result Date: 06/13/2022 CLINICAL DATA:  Status post gallbladder surgery 3 weeks ago with continued abdominal pain. EXAM: CT ABDOMEN AND PELVIS WITH CONTRAST TECHNIQUE: Multidetector CT imaging of the abdomen and pelvis was performed using the standard protocol following bolus administration of intravenous contrast. RADIATION DOSE REDUCTION: This exam was performed according to the departmental dose-optimization program which includes automated exposure control, adjustment of the mA and/or kV according to patient size and/or use of iterative reconstruction technique.  CONTRAST:  144m OMNIPAQUE IOHEXOL 300 MG/ML  SOLN COMPARISON:  March 12, 2022 FINDINGS: Lower chest: No acute abnormality. Hepatobiliary: Stable, adjacent 8 mm and 9 mm foci of parenchymal low attenuation are seen within the anterolateral aspect of the right lobe of the liver. A 5.7 cm x 3.5 cm x 2.9 cm well-defined area of low attenuation (approximately 3.58 Hounsfield units) is seen extending from the inferolateral aspect of the gallbladder fossa along the anterolateral aspect of the right lobe of the liver. Multiple surgical clips are seen within the gallbladder fossa. The common bile duct measures 9 mm in diameter (coronal reformatted image 58, CT series 4). A 2.6 mm calcification is adjacent to the distal aspect of the common bile duct (axial CT image 35, CT series 2) and is located within the pancreatic head. 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 Pancreas: There is diffuse atrophy of the pancreatic parenchyma with numerous subcentimeter pancreatic parenchymal calcifications noted Spleen: Normal in size without focal abnormality. Adrenals/Urinary Tract: The right adrenal gland is not clearly identified. The left adrenal gland is unremarkable. 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 The left kidney is normal in size, without renal calculi or hydronephrosis. Subcentimeter cysts are seen within the posterior aspect of the mid left kidney. Bladder is unremarkable. Stomach/Bowel: Stomach is within normal limits. The appendix is not identified. A large amount of stool is seen within the distal sigmoid colon and rectum. No evidence of bowel wall thickening, distention, or inflammatory changes. Noninflamed diverticula are seen throughout the descending and sigmoid colon. Vascular/Lymphatic: Aortic atherosclerosis. A 5.4  cm x 4.7 cm x 8.0 cm necrotic soft tissue mass is seen anterior to the abdominal aorta. This is at the level of the left kidney and measures 2.3 cm x 2.5 cm on the prior exam.  Reproductive: Prostate is unremarkable. Other: No abdominal wall hernia or abnormality. No abdominopelvic ascites. Musculoskeletal: Multilevel degenerative changes seen throughout the lumbar spine. IMPRESSION: 1. Evidence of prior cholecystectomy. 2. Additional findings that may represent a large postoperative fluid collection versus biloma extending into the anterolateral aspect of the right lobe of the liver. 3. Enlarging 5.4 cm x 4.7 cm x 8.0 cm necrotic soft tissue mass anterior to the abdominal aorta, concerning for the presence of an underlying neoplastic process. Further evaluation with tissue sampling and nuclear medicine PET/CT is recommended. 4. Colonic diverticulosis. 5. Large amount of impacted stool within the distal sigmoid colon and rectum. 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 8. Aortic atherosclerosis. Electronically Signed   By: Virgina Norfolk M.D.   On: 06/13/2022 17:49    Anti-infectives: Anti-infectives (From admission, onward)    None       Assessment/Plan: Stage IV urothelial cancer-losing weight, no appetite and appears cachectic.  He is off chemotherapy.  Concern for progression of intra-abdominal disease on CT scan from last night.  Has significant retroperitoneal adenopathy and I wonder if this is the cause of his pain.  Recommend he discuss further with oncology about goals of care since he is quite confused about goals of care and is not thriving in his current state.  He was referred for radiation therapy with a decided to hold off on that.  I explained that he needs to define his own goals of care with his oncologist.  We discussed quality of life with treatment and without treatment and hopefully he can come to a decision about his own personal situation.  Status post lap chole 1 month ago-no peritonitis so I doubt bile leak.  Will try to obtain HIDA.  Okay to eat at this point time since the likelihood of a bile leak I think  is quite low given his overall exam and labs.  IR could be consulted if aspiration is recommended but he does not have a large biloma or free fluid to indicate that.  Clinically, I think suspicion for for bile leak is low in the situation but will rule it out once able to do so depending on hospital resources and availability.  Total time 30 minutes   LOS: 1 day    Marcello Moores A Greysen Devino 06/14/2022

## 2022-06-14 NOTE — Progress Notes (Signed)
Met with patient who was so welcoming. Shared in a mutually enjoyable conversation. Patient shared his thoughts and concerns for wife and children. Provided presence and support, we will continue to follow up.

## 2022-06-15 DIAGNOSIS — C7910 Secondary malignant neoplasm of unspecified urinary organs: Secondary | ICD-10-CM | POA: Diagnosis not present

## 2022-06-15 DIAGNOSIS — R109 Unspecified abdominal pain: Secondary | ICD-10-CM

## 2022-06-15 DIAGNOSIS — S32000A Wedge compression fracture of unspecified lumbar vertebra, initial encounter for closed fracture: Secondary | ICD-10-CM | POA: Diagnosis not present

## 2022-06-15 DIAGNOSIS — K222 Esophageal obstruction: Secondary | ICD-10-CM | POA: Diagnosis not present

## 2022-06-15 DIAGNOSIS — C661 Malignant neoplasm of right ureter: Secondary | ICD-10-CM | POA: Diagnosis not present

## 2022-06-15 DIAGNOSIS — K668 Other specified disorders of peritoneum: Secondary | ICD-10-CM | POA: Diagnosis not present

## 2022-06-15 DIAGNOSIS — S22000A Wedge compression fracture of unspecified thoracic vertebra, initial encounter for closed fracture: Secondary | ICD-10-CM | POA: Diagnosis not present

## 2022-06-15 DIAGNOSIS — R531 Weakness: Secondary | ICD-10-CM | POA: Diagnosis not present

## 2022-06-15 DIAGNOSIS — Z9049 Acquired absence of other specified parts of digestive tract: Secondary | ICD-10-CM | POA: Diagnosis not present

## 2022-06-15 DIAGNOSIS — C799 Secondary malignant neoplasm of unspecified site: Secondary | ICD-10-CM

## 2022-06-15 DIAGNOSIS — E1122 Type 2 diabetes mellitus with diabetic chronic kidney disease: Secondary | ICD-10-CM | POA: Diagnosis not present

## 2022-06-15 DIAGNOSIS — K5900 Constipation, unspecified: Secondary | ICD-10-CM | POA: Diagnosis not present

## 2022-06-15 LAB — CBC WITH DIFFERENTIAL/PLATELET
Abs Immature Granulocytes: 0.2 10*3/uL — ABNORMAL HIGH (ref 0.00–0.07)
Basophils Absolute: 0 10*3/uL (ref 0.0–0.1)
Basophils Relative: 0 %
Eosinophils Absolute: 0.2 10*3/uL (ref 0.0–0.5)
Eosinophils Relative: 2 %
HCT: 24.9 % — ABNORMAL LOW (ref 39.0–52.0)
Hemoglobin: 7.6 g/dL — ABNORMAL LOW (ref 13.0–17.0)
Immature Granulocytes: 2 %
Lymphocytes Relative: 8 %
Lymphs Abs: 0.9 10*3/uL (ref 0.7–4.0)
MCH: 25.2 pg — ABNORMAL LOW (ref 26.0–34.0)
MCHC: 30.5 g/dL (ref 30.0–36.0)
MCV: 82.5 fL (ref 80.0–100.0)
Monocytes Absolute: 0.6 10*3/uL (ref 0.1–1.0)
Monocytes Relative: 5 %
Neutro Abs: 9.3 10*3/uL — ABNORMAL HIGH (ref 1.7–7.7)
Neutrophils Relative %: 83 %
Platelets: 462 10*3/uL — ABNORMAL HIGH (ref 150–400)
RBC: 3.02 MIL/uL — ABNORMAL LOW (ref 4.22–5.81)
RDW: 15.1 % (ref 11.5–15.5)
WBC: 11.2 10*3/uL — ABNORMAL HIGH (ref 4.0–10.5)
nRBC: 0 % (ref 0.0–0.2)

## 2022-06-15 LAB — COMPREHENSIVE METABOLIC PANEL
ALT: 11 U/L (ref 0–44)
AST: 10 U/L — ABNORMAL LOW (ref 15–41)
Albumin: 2.4 g/dL — ABNORMAL LOW (ref 3.5–5.0)
Alkaline Phosphatase: 101 U/L (ref 38–126)
Anion gap: 10 (ref 5–15)
BUN: 17 mg/dL (ref 8–23)
CO2: 21 mmol/L — ABNORMAL LOW (ref 22–32)
Calcium: 8.7 mg/dL — ABNORMAL LOW (ref 8.9–10.3)
Chloride: 105 mmol/L (ref 98–111)
Creatinine, Ser: 0.87 mg/dL (ref 0.61–1.24)
GFR, Estimated: >60 mL/min (ref >60)
Glucose, Bld: 156 mg/dL — ABNORMAL HIGH (ref 70–99)
Potassium: 3.6 mmol/L (ref 3.5–5.1)
Sodium: 136 mmol/L (ref 135–145)
Total Bilirubin: 0.6 mg/dL (ref 0.3–1.2)
Total Protein: 6.4 g/dL — ABNORMAL LOW (ref 6.5–8.1)

## 2022-06-15 LAB — GLUCOSE, CAPILLARY
Glucose-Capillary: 144 mg/dL — ABNORMAL HIGH (ref 70–99)
Glucose-Capillary: 139 mg/dL — ABNORMAL HIGH (ref 70–99)
Glucose-Capillary: 135 mg/dL — ABNORMAL HIGH (ref 70–99)
Glucose-Capillary: 126 mg/dL — ABNORMAL HIGH (ref 70–99)

## 2022-06-15 LAB — MAGNESIUM: Magnesium: 2.1 mg/dL (ref 1.7–2.4)

## 2022-06-15 MED ORDER — ENSURE ENLIVE PO LIQD
237.0000 mL | Freq: Three times a day (TID) | ORAL | Status: DC
  Administered 2022-06-15 – 2022-06-24 (×11): 237 mL via ORAL

## 2022-06-15 MED ORDER — SODIUM CHLORIDE 0.9 % IV SOLN: INTRAVENOUS | Status: AC

## 2022-06-15 MED ORDER — SORBITOL 70 % SOLN
960.0000 mL | TOPICAL_OIL | Freq: Once | ORAL | Status: AC
  Administered 2022-06-15: 960 mL via RECTAL
  Filled 2022-06-15: qty 240

## 2022-06-15 NOTE — Progress Notes (Signed)
Mobility Specialist - Progress Note   06/15/22 1330  Mobility  Activity Ambulated with assistance in hallway  Level of Assistance Contact guard assist, steadying assist  Assistive Device Front wheel walker  Distance Ambulated (ft) 250 ft  Activity Response Tolerated well  $Mobility charge 1 Mobility   Pt received in bed and agreed for mobility, no c/o pain nor discomfort during ambulation. Pt back to bed with all needs met.   Roderick Pee Mobility Specialist

## 2022-06-15 NOTE — Progress Notes (Signed)
PROGRESS NOTE    Hector Lopez  WYO:[id number] DOB: [birth year] DOA: 06/13/2022 PCP: [name], MD    Chief Complaint  Patient presents with   Failure To Thrive    Brief Narrative:  Patient 76 year old gentleman history of laparoscopic cholecystectomy, stage IV urothelial cancer, GERD, diabetes mellitus type 2 presented to the ED with abdominal pain, failure to thrive.  CT showing possible fluid collection in the gallbladder fossa.  CT also consistent with enlarging necrotic soft tissue mass anterior to the abdominal aorta concerning for underlying neoplastic process, large amount of impacted stool within the distal sigmoid colon and rectum.  Patient placed on IV pain medication, IV fluids, electrolyte repletion.  ED contacted patient's primary oncologist Dr. Alen Blew who recommended admission for further inpatient management due to p.o. intolerance and anorexia.  General surgery consulted.   Assessment & Plan:   Principal Problem:   Generalized weakness Active Problems:   Essential hypertension   Abdominal pain   Mixed hyperlipidemia   Failure to thrive in adult   Loss of weight   Anorexia   Hypoalbuminemia due to protein-calorie malnutrition (HCC)   Hypokalemia   Dehydration   Palliative care encounter   ACP (advance care planning)   Urothelial cancer ([place])   Metastatic malignant neoplasm (HCC)   Intractable pain   Appetite loss   Goals of care, counseling/discussion   Constipation   Biloma   Hypomagnesemia  #1 generalized weakness/failure to thrive in adult/hypoalbuminemia/moderate protein calorie malnutrition -Likely secondary to stage IV urothelial cancer. -Patient also noted to be constipated has not had a bowel movement in 5 weeks. -Patient noted with 30 pound weight loss over the past 3 months with poor appetite due to food not tasting well. -Patient placed on nutritional supplementation, dietitian to be consulted. -Palliative care consulted.  2.  Abdominal  pain/abdominal mass -CT abdomen and pelvis with enlarging 5.4 x 4.7 x 8 cm necrotic soft tissue mass anterior to the abdominal aorta concerning for presence of underlying neoplastic process. -Patient also constipated, stated has not had a bowel movement in 5 weeks, CT abdomen and pelvis done with large amount of impacted stool within the distal sigmoid colon and rectum. - SMOG enema given yesterday with 1 small bowel movement. -Repeat smog enema today. -Was noted per admitting physician to ED already consulted Dr. Alen Blew. -Hematology/oncology following. -Palliative care following for symptom management and pain medications being adjusted by palliative care.  3.  Constipation -Patient states no bowel movement in 5 weeks. -CT abdomen and pelvis with large amount of impacted stool within the distal sigmoid colon and rectum. -Smog enema given yesterday with 1 small bowel movement per patient. -Repeat smog enema today. -Patient started on bowel regimen of MiraLAX twice daily, Senokot-S twice daily.  4.  Stage IV urothelial cancer -Oncology informed of admission. -Palliative care consulted.  5. ?  Large postop fluid collection versus biloma -Noted on CT abdomen and pelvis. -Patient seen by general surgery who doubt patient has a biloma, HIDA scan ordered and pending hopefully to be done tomorrow.. -Patient started on a diet per general surgery. -Per general surgery.  6.  Dehydration -IV fluids.  7.  Hyperlipidemia -Statin.  8.  Type 2 diabetes mellitus with uncontrolled hyperglycemia -Hemoglobin A1c 10.8 (03/29/2022) -CBG 144 this morning. -Continue Semglee 10 units daily, SSI.    9.  Hypokalemia/hypomagnesemia -Magnesium at 2.1 from 1.6.  Potassium at 3.6. -Repeat labs in AM.   10.  GERD PPI.  11.  Hypertension -Stable on  current regimen of Norvasc, lisinopril.     DVT prophylaxis: Heparin Code Status: Full Family Communication: Updated patient, wife, daughter at  bedside. Disposition: Likely home with home health when clinically improved versus SNF  Status is: Inpatient Remains inpatient appropriate because: Severity of illness   Consultants:  General surgery: Dr. Brantley Stage 06/13/2022 Palliative care: Dr. Vinetta Bergamo 06/14/2022  Procedures:  CT abdomen and pelvis 06/13/2022 HIDA scan pending  Antimicrobials:  None   Subjective: Sitting up in bed.  Family at bedside.  No chest pain or shortness of breath.  Still with some abdominal discomfort.  Had a bowel movement after enema yesterday but not a significant 1 per patient.  Patient states has been speaking with palliative care and may start to be leaning towards comfort measures however wants to discuss with his oncologist on options prior to making a final decision on comfort measures.  Objective: Vitals:   06/14/22 0935 06/14/22 1430 06/14/22 2112 06/15/22 0513  BP: (!) 159/86 (!) 148/74 130/86 (!) 159/82  Pulse: (!) 108 (!) 107 100 94  Resp: '18 18 18 17  '$ Temp: 98 F (36.7 C) 98.1 F (36.7 C) 98.7 F (37.1 C) 98.3 F (36.8 C)  TempSrc: Oral Oral Oral Oral  SpO2: 97% 98% 97% 98%  Weight:      Height:        Intake/Output Summary (Last 24 hours) at 06/15/2022 1155 Last data filed at 06/14/2022 1700 Gross per 24 hour  Intake 120 ml  Output 1 ml  Net 119 ml    Filed Weights   06/13/22 1638 06/14/22 0133  Weight: 63.2 kg 63 kg    Examination:  General exam: NAD Respiratory system: CTA B.  No wheezes, no crackles, no rhonchi.  Fair air movement.  Speaking in full sentences.  Cardiovascular system: Regular rate rhythm no murmurs rubs or gallops.  No JVD.  No lower extremity edema.  Gastrointestinal system: Abdomen is soft, nondistended, nontender to palpation.  Positive bowel sounds.  No rebound.  No guarding.  Central nervous system: Alert and oriented. No focal neurological deficits. Extremities: Symmetric 5 x 5 power. Skin: No rashes, lesions or ulcers Psychiatry: Judgement and  insight appear normal. Mood & affect appropriate.     Data Reviewed: I have personally reviewed following labs and imaging studies  CBC: Recent Labs  Lab 06/13/22 1600 06/14/22 0630 06/15/22 0718  WBC 10.6* 14.8* 11.2*  NEUTROABS 8.4*  --  9.3*  HGB 8.8* 8.5* 7.6*  HCT 28.4* 28.3* 24.9*  MCV 81.1 83.5 82.5  PLT 527* 531* 462*     Basic Metabolic Panel: Recent Labs  Lab 06/13/22 1600 06/14/22 0630 06/15/22 0718  NA 135 137 136  K 3.3* 3.7 3.6  CL 103 103 105  CO2 23 22 21*  GLUCOSE 273* 207* 156*  BUN 35* 21 17  CREATININE 1.16 0.88 0.87  CALCIUM 9.2 9.2 8.7*  MG 1.8 1.6* 2.1  PHOS 3.0 3.0  --      GFR: Estimated Creatinine Clearance: 64.4 mL/min (by C-G formula based on SCr of 0.87 mg/dL).  Liver Function Tests: Recent Labs  Lab 06/13/22 1600 06/14/22 0630 06/15/22 0718  AST 14* 11* 10*  ALT '15 12 11  '$ ALKPHOS 133* 126 101  BILITOT 0.5 0.7 0.6  PROT 7.0 7.0 6.4*  ALBUMIN 2.5* 2.6* 2.4*     CBG: Recent Labs  Lab 06/14/22 1214 06/14/22 1601 06/14/22 2110 06/15/22 0731 06/15/22 1132  GLUCAP 171* 165* 227* 144* 139*  No results found for this or any previous visit (from the past 240 hour(s)).       Radiology Studies: CT Abdomen Pelvis W Contrast  Result Date: 06/13/2022 CLINICAL DATA:  Status post gallbladder surgery 3 weeks ago with continued abdominal pain. EXAM: CT ABDOMEN AND PELVIS WITH CONTRAST TECHNIQUE: Multidetector CT imaging of the abdomen and pelvis was performed using the standard protocol following bolus administration of intravenous contrast. RADIATION DOSE REDUCTION: This exam was performed according to the departmental dose-optimization program which includes automated exposure control, adjustment of the mA and/or kV according to patient size and/or use of iterative reconstruction technique. CONTRAST:  110m OMNIPAQUE IOHEXOL 300 MG/ML  SOLN COMPARISON:  March 12, 2022 FINDINGS: Lower chest: No acute abnormality.  Hepatobiliary: Stable, adjacent 8 mm and 9 mm foci of parenchymal low attenuation are seen within the anterolateral aspect of the right lobe of the liver. A 5.7 cm x 3.5 cm x 2.9 cm well-defined area of low attenuation (approximately 3.58 Hounsfield units) is seen extending from the inferolateral aspect of the gallbladder fossa along the anterolateral aspect of the right lobe of the liver. Multiple surgical clips are seen within the gallbladder fossa. The common bile duct measures 9 mm in diameter (coronal reformatted image 58, CT series 4). A 2.6 mm calcification is adjacent to the distal aspect of the common bile duct (axial CT image 35, CT series 2) and is located within the pancreatic head. 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 Pancreas: There is diffuse atrophy of the pancreatic parenchyma with numerous subcentimeter pancreatic parenchymal calcifications noted Spleen: Normal in size without focal abnormality. Adrenals/Urinary Tract: The right adrenal gland is not clearly identified. The left adrenal gland is unremarkable. 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 The left kidney is normal in size, without renal calculi or hydronephrosis. Subcentimeter cysts are seen within the posterior aspect of the mid left kidney. Bladder is unremarkable. Stomach/Bowel: Stomach is within normal limits. The appendix is not identified. A large amount of stool is seen within the distal sigmoid colon and rectum. No evidence of bowel wall thickening, distention, or inflammatory changes. Noninflamed diverticula are seen throughout the descending and sigmoid colon. Vascular/Lymphatic: Aortic atherosclerosis. A 5.4 cm x 4.7 cm x 8.0 cm necrotic soft tissue mass is seen anterior to the abdominal aorta. This is at the level of the left kidney and measures 2.3 cm x 2.5 cm on the prior exam. Reproductive: Prostate is unremarkable. Other: No abdominal wall hernia or abnormality. No abdominopelvic ascites.  Musculoskeletal: Multilevel degenerative changes seen throughout the lumbar spine. IMPRESSION: 1. Evidence of prior cholecystectomy. 2. Additional findings that may represent a large postoperative fluid collection versus biloma extending into the anterolateral aspect of the right lobe of the liver. 3. Enlarging 5.4 cm x 4.7 cm x 8.0 cm necrotic soft tissue mass anterior to the abdominal aorta, concerning for the presence of an underlying neoplastic process. Further evaluation with tissue sampling and nuclear medicine PET/CT is recommended. 4. Colonic diverticulosis. 5. Large amount of impacted stool within the distal sigmoid colon and rectum. 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 8. Aortic atherosclerosis. Electronically Signed   By: TVirgina NorfolkM.D.   On: 06/13/2022 17:49        Scheduled Meds:  acetaminophen  1,000 mg Oral TID   amLODipine  10 mg Oral Q1500   enoxaparin (LOVENOX) injection  40 mg Subcutaneous Q24H   feeding supplement (GLUCERNA  SHAKE)  237 mL Oral TID BM   insulin aspart  0-5 Units Subcutaneous QHS   insulin aspart  0-9 Units Subcutaneous TID WC   insulin glargine-yfgn  10 Units Subcutaneous QHS   lisinopril  40 mg Oral Q1500   pantoprazole  40 mg Oral Daily   polyethylene glycol  17 g Oral BID   senna-docusate  1 tablet Oral BID   simvastatin  20 mg Oral QHS   Continuous Infusions:  sodium chloride 100 mL/hr at 06/15/22 0942     LOS: 2 days    Time spent: 40 minutes    Irine Seal, MD [facility]   To contact the attending provider between 7A-7P or the covering provider during after hours 7P-7A, please log into the web site [email or web address] and access using universal [place] password for that web site. If you do not have the password, please call the hospital operator.  06/15/2022, 11:55 AM

## 2022-06-15 NOTE — Progress Notes (Signed)
Subjective/Chief Complaint: Patient feels about the same.  No significant change to abdominal pain HIDA study not done due to staffing issues.  Palliative care has seen the patient and had a good discussion with he and his family yesterday.   Objective: Vital signs in last 24 hours: Temp:  [98 F (36.7 C)-98.7 F (37.1 C)] 98.3 F (36.8 C) (10/01 0513) Pulse Rate:  [94-108] 94 (10/01 0513) Resp:  [17-18] 17 (10/01 0513) BP: (130-159)/(74-86) 159/82 (10/01 0513) SpO2:  [97 %-98 %] 98 % (10/01 0513) Last BM Date : 06/14/22  Intake/Output from previous day: 09/30 0701 - 10/01 0700 In: 120 [P.O.:120] Out: 1 [Stool:1] Intake/Output this shift: No intake/output data recorded.   Abdomen: Soft diffuse pain noted by patient.  On exam, he is soft without rebound or guarding. Lab Results:  Recent Labs    06/13/22 1600 06/14/22 0630  WBC 10.6* 14.8*  HGB 8.8* 8.5*  HCT 28.4* 28.3*  PLT 527* 531*   BMET Recent Labs    06/13/22 1600 06/14/22 0630  NA 135 137  K 3.3* 3.7  CL 103 103  CO2 23 22  GLUCOSE 273* 207*  BUN 35* 21  CREATININE 1.16 0.88  CALCIUM 9.2 9.2   PT/INR No results for input(s): "LABPROT", "INR" in the last 72 hours. ABG No results for input(s): "PHART", "HCO3" in the last 72 hours.  Invalid input(s): "PCO2", "PO2"  Studies/Results: CT Abdomen Pelvis W Contrast  Result Date: 06/13/2022 CLINICAL DATA:  Status post gallbladder surgery 3 weeks ago with continued abdominal pain. EXAM: CT ABDOMEN AND PELVIS WITH CONTRAST TECHNIQUE: Multidetector CT imaging of the abdomen and pelvis was performed using the standard protocol following bolus administration of intravenous contrast. RADIATION DOSE REDUCTION: This exam was performed according to the departmental dose-optimization program which includes automated exposure control, adjustment of the mA and/or kV according to patient size and/or use of iterative reconstruction technique. CONTRAST:  122m OMNIPAQUE  IOHEXOL 300 MG/ML  SOLN COMPARISON:  March 12, 2022 FINDINGS: Lower chest: No acute abnormality. Hepatobiliary: Stable, adjacent 8 mm and 9 mm foci of parenchymal low attenuation are seen within the anterolateral aspect of the right lobe of the liver. A 5.7 cm x 3.5 cm x 2.9 cm well-defined area of low attenuation (approximately 3.58 Hounsfield units) is seen extending from the inferolateral aspect of the gallbladder fossa along the anterolateral aspect of the right lobe of the liver. Multiple surgical clips are seen within the gallbladder fossa. The common bile duct measures 9 mm in diameter (coronal reformatted image 58, CT series 4). A 2.6 mm calcification is adjacent to the distal aspect of the common bile duct (axial CT image 35, CT series 2) and is located within the pancreatic head. 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 Pancreas: There is diffuse atrophy of the pancreatic parenchyma with numerous subcentimeter pancreatic parenchymal calcifications noted Spleen: Normal in size without focal abnormality. Adrenals/Urinary Tract: The right adrenal gland is not clearly identified. The left adrenal gland is unremarkable. 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 The left kidney is normal in size, without renal calculi or hydronephrosis. Subcentimeter cysts are seen within the posterior aspect of the mid left kidney. Bladder is unremarkable. Stomach/Bowel: Stomach is within normal limits. The appendix is not identified. A large amount of stool is seen within the distal sigmoid colon and rectum. No evidence of bowel wall thickening, distention, or inflammatory changes. Noninflamed diverticula are seen throughout the descending and sigmoid colon. Vascular/Lymphatic:  Aortic atherosclerosis. A 5.4 cm x 4.7 cm x 8.0 cm necrotic soft tissue mass is seen anterior to the abdominal aorta. This is at the level of the left kidney and measures 2.3 cm x 2.5 cm on the prior exam. Reproductive: Prostate is  unremarkable. Other: No abdominal wall hernia or abnormality. No abdominopelvic ascites. Musculoskeletal: Multilevel degenerative changes seen throughout the lumbar spine. IMPRESSION: 1. Evidence of prior cholecystectomy. 2. Additional findings that may represent a large postoperative fluid collection versus biloma extending into the anterolateral aspect of the right lobe of the liver. 3. Enlarging 5.4 cm x 4.7 cm x 8.0 cm necrotic soft tissue mass anterior to the abdominal aorta, concerning for the presence of an underlying neoplastic process. Further evaluation with tissue sampling and nuclear medicine PET/CT is recommended. 4. Colonic diverticulosis. 5. Large amount of impacted stool within the distal sigmoid colon and rectum. 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 8. Aortic atherosclerosis. Electronically Signed   By: Virgina Norfolk M.D.   On: 06/13/2022 17:49    Anti-infectives: Anti-infectives (From admission, onward)    None       Assessment/Plan: 1 month status post laparoscopic cholecystectomy  Metastatic stage IV urothelial cancer with intra-abdominal adenopathy  Failure to thrive  Given exam findings and laboratory studies, I think bile leak from cholecystectomy less likely.  I do recommend a HIDA neck to be done tomorrow.  I would wait on those results as long as those are normal I do not think the fluid collection is gallbladder fossa is anything significant at this point in time.  Certainly if his white count continues to climb this could be aspirated by IR but I think that can wait until the HIDA is complete tomorrow given his overall situation.  We will follow-up on Monday.   LOS: 2 days    Joyice Faster Leah Thornberry 06/15/2022

## 2022-06-15 NOTE — Progress Notes (Signed)
Initial Nutrition Assessment RD working remotely.  DOCUMENTATION CODES:   Not applicable  INTERVENTION:  - will liberalize diet from Heart Healthy/Carb Modified to Regular.  - will d/c Glucerna Shake, each supplement provides 220 kcal and 10 grams protein.  - will order Ensure Plus High Protein TID, each supplement provides 350 kcal and 20 grams of protein.  - complete NFPE when feasible.   NUTRITION DIAGNOSIS:   Increased nutrient needs related to acute illness, cancer and cancer related treatments, chronic illness as evidenced by estimated needs.  GOAL:   Patient will meet greater than or equal to 90% of their needs  MONITOR:   PO intake, Supplement acceptance, Labs, Weight trends  REASON FOR ASSESSMENT:   Malnutrition Screening Tool, Consult Assessment of nutrition requirement/status  ASSESSMENT:   76 year old male with medical history of lap chole, stage IV urothelial cancer, GERD, type 2 DM, chronic back pain, iron deficiency anemia, and HTN. He presented to the ED with abdominal pain, failure to thrive.  In the ED, CT showed possible fluid collection in the gallbladder and was also consistent with enlarging necrotic soft tissue mass anterior to the abdominal aorta concerning for underlying neoplastic process and large amount of impacted stool within the distal sigmoid colon and rectum. Patient placed on IV pain medication, IV fluids, electrolyte repletion.  ED contacted patient's primary oncologist Dr. Alen Blew who recommended admission for further inpatient management due to p.o. intolerance and anorexia. General surgery consulted.  Assessment by another RD last done on 03/31/22 at which time it was documented that patient had R flank and mid-back pain x5 weeks leading to poor oral intake and 14 lb weight loss in 4 weeks.   Per notes from this hospitalization, patient reported not having a BM x5 weeks PTA. He reported 30 lb weight loss in 3 months due to poor appetite and  food not tasting good/taste alterations.   Weight yesterday was 139 lb and weight on 6/26 was 184 lb. This indicates 45 lb weight loss (24% body weight) in the past 3 months; significant for time frame.   Palliative Care is following and note from today reviewed.   General Surgery is following and notes that patient is 1 month post-op from lap chole. Recommendation made for HIDA scan of the neck to be done on 10/2   Labs reviewed; CBGs: 144 and 139 mg/dl, Ca: 8.7 mg/dl.  Medications reviewed; sliding scale novolog, 10 units semglee/day, 40 mg oral protonix/day, 17 g miralax BID, 1 tablet senokot BID, SMOG enema 10/1 AM.  IVF; NS @ 100 ml/hr.    NUTRITION - FOCUSED PHYSICAL EXAM:  RD working remotely.   Diet Order:   Diet Order             Diet regular Room service appropriate? Yes; Fluid consistency: Thin  Diet effective now                   EDUCATION NEEDS:   No education needs have been identified at this time  Skin:  Skin Assessment: Reviewed RN Assessment  Last BM:  PTA/unknown  Height:   Ht Readings from Last 1 Encounters:  06/14/22 '5\' 10"'$  (1.778 m)    Weight:   Wt Readings from Last 1 Encounters:  06/14/22 63 kg    BMI:  Body mass index is 19.93 kg/m.  Estimated Nutritional Needs:  Kcal:  1900-2100 kcal Protein:  85-100 grams Fluid:  >/= 2.1 L/day     Jarome Matin, MS, RD, LDN, CNSC  Clinical Dietitian PRN/Relief staff On-call/weekend pager # available in [facility]

## 2022-06-15 NOTE — Progress Notes (Signed)
Daily Progress Note   Patient Name: Hector Lopez       Date: 06/15/2022 DOB: July 28, 1946  Age: 76 y.o. MRN#: [id number] Attending Physician: Eugenie Filler, MD Primary Care Physician: [name], MD Admit Date: 06/13/2022  Reason for Consultation/Follow-up: complex medical decision making and symptom management  Subjective: Presented to bedside in early afternoon.  Was laying comfortably in bed after being repositioned.  Patient's wife and daughter were both at bedside.  Again able to reintroduce myself as a member of the palliative care team.  Addressed patient's symptoms at this time as that was his primary concern.  Patient did have bowel movement yesterday after enema. Patient having abdominal pain though it is improved at this time.  At time of EMR review patient had received IV Dilaudid 0.5 mg x 3 doses in the past 24 hours and p.o. Dilaudid 2 mg x 1 dose in the past 24 hours.  Overall this is a decrease in opioid requirements as compared to the prior day.   Patient still having abdominal pain though feels that his current medication regiment is helping with the pain management.  Patient discussed that overnight there was confusion about getting oral pain medications versus IV pain medications.  Spent time today again reviewing proper use of opioid medications.  Again discussed the idea of getting oral short acting medications first as they take longer to work the work for a longer amount of time as compared to IV pain medications which quickly works and quickly wears off.  Attempting to find a regimen patient can go home on.  Patient and family voiced agreement with plan.  While in patient's room, he did note the pain was starting to "creep up" encouraged him to push the nurse button at  that time to get oral medication which he did.  Noted the idea of oral medication is to get ahead of the pain before it is so acutely bad it cannot get under control unless with IV pain medications.  Wife also mentioned that at times when at home patient has had difficulty swallowing so we counseled on use of liquid opioid medications if needed in the future.  Right now patient able to take p.o. Dilaudid tablet.  Also able to follow-up on discussions from yesterday.  Patient and family have been considering patient's  current medical status.  Family and patient continue to endorses overall weakness and concerned that any further cancer directed therapies would make him feel worse.  Agreed with their concerns.  With permission, spent time discussing pathways for medical care again moving forward including continuing with aggressive medical care versus transitioning to comfort focused care with hospice.  Patient and family asked regarding prognosis and though a bit do not have a crystal ball due to patient's overall deconditioning, decreased oral intake, worsening fatigue, and increased sleeping, concerned we are looking at weeks to months.  Do believe patient is appropriate for hospice at this time.  Spent time discussing with patient what his goals for medical care moving forward.  He notes that he wants to be able to have his pain better controlled at home, eat, and spend time with family.  He also noted that he does not want to "fear".  When exploring this further he noted that he has some degree in theology and so he fears what will happen after death.  We spent time exploring this and supporting his spiritual thoughts regarding it.  Based on patient's desire for medical care moving forward, with permission again discussed what home hospice would entail including focusing on pain management/symptom management, having a team support with social worker and chaplain for patient and family, having a number to  call 24/7 so the patient would not need to dial 911 and come back to the hospital.  Patient agreed that this sounded wonderful and would be something he would want.  Patient then also voiced that his oncologist will return tomorrow and so he would like to also follow-up with his oncologist regarding this.  Greatly encouraged discussions with oncology as he has had a longstanding relationship with his oncologist and trusts his recommendations.  Wife also acknowledged that oncologist had been discussing patient's weakness and in fact had not been hasty to recommend things like radiation because patient was so weak and this could cause him more harm; agreed with this concern.  Still encourage discussions with oncologist tomorrow and noted that palliative care would follow-up tomorrow as well.  Again with permission asked to discuss patient's CODE STATUS with him.  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  We discussed that his CODE STATUS is currently full code though once discussing it and his metastatic cancer, concerned that chest compressions and intubation would cause more harm and not change his overall prognosis.  Patient very much agreed with this and agreed to CODE STATUS change of DNR/DNI.  We discussed that this CODE STATUS applies when patient is sick enough that his heart stops or he stops breathing and up until that point he can continue to receive appropriate medical care.  Patient and family agreeing with this.  All questions answered at that time.  Discussed with bedside RN who provided patient with pain medication while present.  Also updated primary hospitalist regarding discussion with patient and family.  Length of Stay: 2  Current Medications: Scheduled Meds:   acetaminophen  1,000 mg Oral TID   amLODipine  10 mg Oral Q1500   enoxaparin (LOVENOX) injection  40 mg Subcutaneous Q24H   feeding  supplement (GLUCERNA SHAKE)  237 mL Oral TID BM   insulin aspart  0-5 Units Subcutaneous QHS   insulin aspart  0-9 Units Subcutaneous TID WC   insulin glargine-yfgn  10 Units Subcutaneous QHS   lisinopril  40 mg Oral Q1500   pantoprazole  40 mg Oral Daily   polyethylene glycol  17 g Oral BID   senna-docusate  1 tablet Oral BID   simvastatin  20 mg Oral QHS   sorbitol, milk of mag, mineral oil, glycerin (SMOG) enema  960 mL Rectal Once    Continuous Infusions:  sodium chloride      PRN Meds: alum & mag hydroxide-simeth, HYDROmorphone (DILAUDID) injection, HYDROmorphone, ondansetron **OR** ondansetron (ZOFRAN) IV  Physical Exam          Vital Signs: BP (!) 159/82 (BP Location: Right Arm)   Pulse 94   Temp 98.3 F (36.8 C) (Oral)   Resp 17   Ht '5\' 10"'$  (1.778 m)   Wt 63 kg   SpO2 98%   BMI 19.93 kg/m  SpO2: SpO2: 98 % O2 Device: O2 Device: Room Air O2 Flow Rate:    Intake/output summary:  Intake/Output Summary (Last 24 hours) at 06/15/2022 0854 Last data filed at 06/14/2022 1700 Gross per 24 hour  Intake 120 ml  Output 1 ml  Net 119 ml   LBM: Last BM Date : 06/14/22 Baseline Weight: Weight: 63.2 kg Most recent weight: Weight: 63 kg    Patient Active Problem List   Diagnosis Date Noted   Palliative care encounter    ACP (advance care planning)    Urothelial cancer ([place])    Metastatic malignant neoplasm ([place])    Intractable pain    Appetite loss    Goals of care, counseling/discussion    Constipation    Biloma    Hypomagnesemia    Generalized weakness 06/13/2022   Failure to thrive in adult 06/13/2022   Loss of weight 06/13/2022   Anorexia 06/13/2022   Hypoalbuminemia due to protein-calorie malnutrition ([place]) 06/13/2022   Hypokalemia 06/13/2022   Dehydration 06/13/2022   Mixed hyperlipidemia 05/15/2022   Symptomatic cholelithiasis 05/13/2022   Abdominal pain 03/29/2022   Type 2 diabetes mellitus with hyperglycemia ([place]) 03/28/2022   AKI (acute kidney  injury) ([place]) 03/28/2022   Essential hypertension 03/28/2022   Normocytic anemia 03/28/2022   Right flank pain 03/28/2022   Metastatic renal cell carcinoma ([place]) 03/28/2022   Port-A-Cath in place 07/04/2020   Prostatic hyperplasia 04/13/2020   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 Easten Maceachern is a 76 y.o. male  with past medical history of stage IV high-grade urothelial cancer diagnosed in 2021, HTN, T2DM, GERD, and HLD who was admitted on 06/13/2022 for management of abdominal pain, generalized weakness, and fatigue. Palliative care consulted to assist with complex medical decision making and symptom management.   Recommendations/Plan: # Complex medical decision making/ GOC:       -Followed up with patient and family regarding medical discussions from yesterday.  Patient noted priorities are to focus on pain/symptom management, eat as he wants, and spend time with family.  With his permission did discuss the idea of home hospice which patient noted "sounded ideal".  Patient wanted to follow-up with his primary oncologist tomorrow before final decisions were made regarding pathways for medical care moving forward.   -Again discussed with patient and family concerns about patient's overall weakened state and his ability to further tolerate any cancer directed therapies.  Patient and family agree that he is overall worsening and certain interventions may cause him more harm than good.  -Discussed CODE STATUS today with patient and his family present at bedside.  Patient  previously full code.  After discussion CODE STATUS appropriately changed to DNR/DNI with patient and family agreeing with this.  Discussed with primary hospitalist and bedside RN update on CODE STATUS change as well.  # Symptom Management   - Pain, severe acute on chronic pain in the setting on stage IV urothelial cancer At time of EMR review patient had received  IV Dilaudid 0.5 mg x 3 doses in the past 24 hours and p.o. Dilaudid 2 mg x 1 dose in the past 24 hours.  Overall this is a decrease in opioid medications as compared to the prior day.  Patient still having abdominal pain though feels that his current medication regiment is helping with the pain management.  Denies any adverse effects from opioid medications. -Spent extensive time counseling on use of opioid medications and p.o. versus IV use.  Counseled on need to receive p.o. medication when pain initiating so that it does not get so severe it cannot be controlled except with IV.  Noted hope for p.o. medication is to get patient on regiment he can be well controlled on at home.  Counseled patient and family on use.  Bedside RN  Discussed expectations regarding pain management with patient and family. Hoping for more functional while not oversedated. Discussed adding oral hydromorphone to use as 1st line and IV hydromorphone to use as back up for breakthrough pain. Will plan to review OME use tomorrow to have a full 24 hours to consider addition of long-acting medication.  - Continue Tylenol '1000mg'$  TID scheduled - Continue hydromorphone po '2mg'$  q3hrs prn. This can be received every 3 hrs as needed in addition to the IV medication which can be received every 3 hours as needed. Plan is for patient to receive po medication prior to IV so that he can get on po regimen that is well tolerated to go home.  -Continue IV hydromorphone 0.'5mg'$  to be given q3hrs prn after oral medication (normally allow 30-45 mins after oral medication given to have effect and then can provide with IV if pain still uncontrolled). - Still Considering addition of steroids for inflammatory aspect though will hold at this time with HIDA workup tomorrow and plan for oncology to follow up tomorrow as well.    - Constipation  -Receiving Senna 1 tab BID. This can be increased up to 8 tabs in 24 hour period if needed.  -Continue Miralax -  Appetite -Do not continue megace at time of discharge.   -Continue to offer Ensure as patient and family notes this is the only nutritional source he has been able to tolerate at home.    Prognosis: weeks to months. Patient and family enquired about this today during discussion.  Discharge Planning: TBD (PT w/u w/ rehab vs home OR home with hospice)  Care plan was discussed with patient, family, bedside RN, hospitalist  Thank you for allowing the Palliative Medicine Team to assist in the care of this patient.  Terrilee Files, DO Palliative Care Attending  Please contact Palliative Medicine Team phone at [phone] for questions and concerns.

## 2022-06-15 NOTE — Progress Notes (Signed)
Follow up visit with patient. He was not as engaging today but did acknowledge my presence as well as talked to me briefly. We will continue to follow up to provide support as needed.

## 2022-06-16 ENCOUNTER — Inpatient Hospital Stay (HOSPITAL_COMMUNITY): Payer: Medicare Other

## 2022-06-16 DIAGNOSIS — R627 Adult failure to thrive: Secondary | ICD-10-CM | POA: Diagnosis not present

## 2022-06-16 DIAGNOSIS — K668 Other specified disorders of peritoneum: Secondary | ICD-10-CM | POA: Diagnosis not present

## 2022-06-16 DIAGNOSIS — R52 Pain, unspecified: Secondary | ICD-10-CM | POA: Diagnosis not present

## 2022-06-16 DIAGNOSIS — R630 Anorexia: Secondary | ICD-10-CM | POA: Diagnosis not present

## 2022-06-16 DIAGNOSIS — Z7189 Other specified counseling: Secondary | ICD-10-CM | POA: Diagnosis not present

## 2022-06-16 DIAGNOSIS — K5900 Constipation, unspecified: Secondary | ICD-10-CM | POA: Diagnosis not present

## 2022-06-16 DIAGNOSIS — R109 Unspecified abdominal pain: Secondary | ICD-10-CM | POA: Diagnosis not present

## 2022-06-16 DIAGNOSIS — Z515 Encounter for palliative care: Secondary | ICD-10-CM | POA: Diagnosis not present

## 2022-06-16 DIAGNOSIS — C689 Malignant neoplasm of urinary organ, unspecified: Secondary | ICD-10-CM | POA: Diagnosis not present

## 2022-06-16 DIAGNOSIS — R531 Weakness: Secondary | ICD-10-CM | POA: Diagnosis not present

## 2022-06-16 DIAGNOSIS — C799 Secondary malignant neoplasm of unspecified site: Secondary | ICD-10-CM | POA: Diagnosis not present

## 2022-06-16 LAB — IRON AND TIBC
Iron: 11 ug/dL — ABNORMAL LOW (ref 45–182)
Saturation Ratios: 6 % — ABNORMAL LOW (ref 17.9–39.5)
TIBC: 179 ug/dL — ABNORMAL LOW (ref 250–450)
UIBC: 168 ug/dL

## 2022-06-16 LAB — CBC WITH DIFFERENTIAL/PLATELET
Abs Immature Granulocytes: 0.13 10*3/uL — ABNORMAL HIGH (ref 0.00–0.07)
Basophils Absolute: 0 10*3/uL (ref 0.0–0.1)
Basophils Relative: 0 %
Eosinophils Absolute: 0.2 10*3/uL (ref 0.0–0.5)
Eosinophils Relative: 2 %
HCT: 26.1 % — ABNORMAL LOW (ref 39.0–52.0)
Hemoglobin: 7.5 g/dL — ABNORMAL LOW (ref 13.0–17.0)
Immature Granulocytes: 1 %
Lymphocytes Relative: 10 %
Lymphs Abs: 1.2 10*3/uL (ref 0.7–4.0)
MCH: 25.1 pg — ABNORMAL LOW (ref 26.0–34.0)
MCHC: 28.7 g/dL — ABNORMAL LOW (ref 30.0–36.0)
MCV: 87.3 fL (ref 80.0–100.0)
Monocytes Absolute: 0.6 10*3/uL (ref 0.1–1.0)
Monocytes Relative: 5 %
Neutro Abs: 9.1 10*3/uL — ABNORMAL HIGH (ref 1.7–7.7)
Neutrophils Relative %: 82 %
Platelets: 433 10*3/uL — ABNORMAL HIGH (ref 150–400)
RBC: 2.99 MIL/uL — ABNORMAL LOW (ref 4.22–5.81)
RDW: 15 % (ref 11.5–15.5)
WBC: 11.1 10*3/uL — ABNORMAL HIGH (ref 4.0–10.5)
nRBC: 0 % (ref 0.0–0.2)

## 2022-06-16 LAB — GLUCOSE, CAPILLARY
Glucose-Capillary: 102 mg/dL — ABNORMAL HIGH (ref 70–99)
Glucose-Capillary: 97 mg/dL (ref 70–99)
Glucose-Capillary: 147 mg/dL — ABNORMAL HIGH (ref 70–99)
Glucose-Capillary: 107 mg/dL — ABNORMAL HIGH (ref 70–99)

## 2022-06-16 LAB — RENAL FUNCTION PANEL
Albumin: 2.3 g/dL — ABNORMAL LOW (ref 3.5–5.0)
Anion gap: 5 (ref 5–15)
BUN: 13 mg/dL (ref 8–23)
CO2: 24 mmol/L (ref 22–32)
Calcium: 8.5 mg/dL — ABNORMAL LOW (ref 8.9–10.3)
Chloride: 107 mmol/L (ref 98–111)
Creatinine, Ser: 0.77 mg/dL (ref 0.61–1.24)
GFR, Estimated: >60 mL/min (ref >60)
Glucose, Bld: 113 mg/dL — ABNORMAL HIGH (ref 70–99)
Phosphorus: 2.9 mg/dL (ref 2.5–4.6)
Potassium: 3.3 mmol/L — ABNORMAL LOW (ref 3.5–5.1)
Sodium: 136 mmol/L (ref 135–145)

## 2022-06-16 LAB — FERRITIN: Ferritin: 105 ng/mL (ref 24–336)

## 2022-06-16 LAB — RETICULOCYTES
Immature Retic Fract: 32.8 % — ABNORMAL HIGH (ref 2.3–15.9)
RBC.: 3.04 MIL/uL — ABNORMAL LOW (ref 4.22–5.81)
Retic Count, Absolute: 55.3 10*3/uL (ref 19.0–186.0)
Retic Ct Pct: 1.8 % (ref 0.4–3.1)

## 2022-06-16 LAB — FOLATE: Folate: 6.6 ng/mL (ref >5.9)

## 2022-06-16 LAB — VITAMIN B12: Vitamin B-12: 647 pg/mL (ref 180–914)

## 2022-06-16 LAB — MAGNESIUM: Magnesium: 1.8 mg/dL (ref 1.7–2.4)

## 2022-06-16 MED ORDER — POTASSIUM CHLORIDE CRYS ER 20 MEQ PO TBCR
40.0000 meq | EXTENDED_RELEASE_TABLET | Freq: Once | ORAL | Status: AC
  Administered 2022-06-16: 40 meq via ORAL
  Filled 2022-06-16: qty 2

## 2022-06-16 MED ORDER — LORAZEPAM 0.5 MG PO TABS
0.5000 mg | ORAL_TABLET | ORAL | Status: DC | PRN
  Administered 2022-06-18 – 2022-06-20 (×2): 0.5 mg via ORAL
  Filled 2022-06-16 (×3): qty 1

## 2022-06-16 MED ORDER — LORAZEPAM 2 MG/ML IJ SOLN: 1.0000 mg | INTRAMUSCULAR | Status: DC | PRN

## 2022-06-16 MED ORDER — TECHNETIUM TC 99M MEBROFENIN IV KIT
5.3000 | PACK | Freq: Once | INTRAVENOUS | Status: AC
  Administered 2022-06-16: 5.3 via INTRAVENOUS

## 2022-06-16 MED ORDER — HYDROMORPHONE HCL 1 MG/ML IJ SOLN
1.0000 mg | INTRAMUSCULAR | Status: DC | PRN
  Administered 2022-06-16 – 2022-06-23 (×33): 1 mg via INTRAVENOUS
  Filled 2022-06-16 (×33): qty 1

## 2022-06-16 MED ORDER — LORAZEPAM 2 MG/ML PO CONC: 0.5000 mg | ORAL | Status: DC | PRN

## 2022-06-16 MED ORDER — FENTANYL 25 MCG/HR TD PT72
1.0000 | MEDICATED_PATCH | TRANSDERMAL | Status: DC
  Administered 2022-06-16 – 2022-06-19 (×2): 1 via TRANSDERMAL
  Filled 2022-06-16 (×2): qty 1

## 2022-06-16 MED ORDER — HYDROMORPHONE HCL 1 MG/ML IJ SOLN
1.0000 mg | Freq: Once | INTRAMUSCULAR | Status: AC
  Administered 2022-06-16: 1 mg via INTRAVENOUS
  Filled 2022-06-16: qty 1

## 2022-06-16 MED ORDER — HYDROMORPHONE HCL 1 MG/ML PO LIQD: 3.0000 mg | ORAL | Status: DC | PRN

## 2022-06-16 MED ORDER — HYDROMORPHONE HCL 1 MG/ML PO LIQD
3.0000 mg | ORAL | Status: DC | PRN
  Administered 2022-06-17 – 2022-06-19 (×7): 3 mg via ORAL
  Filled 2022-06-16 (×8): qty 3

## 2022-06-16 MED ORDER — BIOTENE DRY MOUTH MT LIQD: 15.0000 mL | OROMUCOSAL | Status: DC | PRN

## 2022-06-16 MED ORDER — POLYVINYL ALCOHOL 1.4 % OP SOLN: 1.0000 [drp] | Freq: Four times a day (QID) | OPHTHALMIC | Status: DC | PRN

## 2022-06-16 MED ORDER — DEXAMETHASONE 4 MG PO TABS
4.0000 mg | ORAL_TABLET | Freq: Every day | ORAL | Status: DC
  Administered 2022-06-16 – 2022-06-19 (×4): 4 mg via ORAL
  Filled 2022-06-16 (×6): qty 1

## 2022-06-16 MED ORDER — LORAZEPAM 2 MG/ML IJ SOLN: 0.5000 mg | INTRAMUSCULAR | Status: DC | PRN

## 2022-06-16 MED ORDER — LORAZEPAM 1 MG PO TABS: 1.0000 mg | ORAL_TABLET | ORAL | Status: DC | PRN

## 2022-06-16 MED ORDER — SENNOSIDES-DOCUSATE SODIUM 8.6-50 MG PO TABS
2.0000 | ORAL_TABLET | Freq: Two times a day (BID) | ORAL | Status: DC
  Administered 2022-06-16 – 2022-06-21 (×8): 2 via ORAL
  Filled 2022-06-16 (×13): qty 2

## 2022-06-16 MED ORDER — HYDROMORPHONE HCL 2 MG PO TABS: 3.0000 mg | ORAL_TABLET | ORAL | Status: DC | PRN

## 2022-06-16 NOTE — Progress Notes (Signed)
Patient ID: Hector Lopez, male   DOB: 1945-10-15, 76 y.o.   MRN: [id number] Asked by oncology service to eval pt for possible celiac plexus block. Latest imaging was reviewed by Dr. Laurence Ferrari and per his assessment pt unlikely to benefit from celiac block as tumor is fairly remote from celiac plexus. Please contact Dr. Laurence Ferrari at pager [phone] with any additional questions.

## 2022-06-16 NOTE — Care Management Important Message (Signed)
Important Message  Patient Details IM Letter given to the Patient. Name: Hector Lopez MRN: [id number] Date of Birth: 09-May-1946   Medicare Important Message Given:  Yes     Kerin Salen 06/16/2022, 11:33 AM

## 2022-06-16 NOTE — Progress Notes (Signed)
HIDA negative for bile leak or biloma. We will sign off, please call with questions or concerns.   Wellington [place], [facility] 06/16/2022, 12:34 PM Please see Amion for pager number during day hours 7:00am-4:30pm

## 2022-06-16 NOTE — TOC Initial Note (Signed)
Transition of Care [facility]) - Initial/Assessment Note    Patient Details  Name: Hector Lopez MRN: [id number] Date of Birth: 1945/12/09  Transition of Care [facility]) CM/SW Contact:    Leeroy Cha, RN Phone Number: 06/16/2022, 8:49 AM  Clinical Narrative:                  Transition of Care [facility]) Screening Note   Patient Details  Name: Hector Lopez Date of Birth: [birth year]   Transition of Care [facility]) CM/SW Contact:    Leeroy Cha, RN Phone Number: 06/16/2022, 8:49 AM    Transition of Care Department [facility]) has reviewed patient and no TOC needs have been identified at this time. We will continue to monitor patient advancement through interdisciplinary progression rounds. If new patient transition needs arise, please place a TOC consult.    Expected Discharge Plan: Home/Self Care Barriers to Discharge: Continued Medical Work up   Patient Goals and CMS Choice Patient states their goals for this hospitalization and ongoing recovery are:: to return home CMS Medicare.gov Compare Post Acute Care list provided to:: Patient    Expected Discharge Plan and Services Expected Discharge Plan: Home/Self Care       Living arrangements for the past 2 months: Single Family Home                                      Prior Living Arrangements/Services Living arrangements for the past 2 months: Single Family Home   Patient language and need for interpreter reviewed:: Yes Do you feel safe going back to the place where you live?: Yes            Criminal Activity/Legal Involvement Pertinent to Current Situation/Hospitalization: No - Comment as needed  Activities of Daily Living Home Assistive Devices/Equipment: Eyeglasses ADL Screening (condition at time of admission) Patient's cognitive ability adequate to safely complete daily activities?: Yes Is the patient deaf or have difficulty hearing?: No Does the patient have difficulty seeing, even when wearing  glasses/contacts?: Yes (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) Does the patient have difficulty concentrating, remembering, or making decisions?: No Patient able to express need for assistance with ADLs?: Yes Does the patient have difficulty dressing or bathing?: Yes Independently performs ADLs?: No Communication: Independent Dressing (OT): Needs assistance Is this a change from baseline?: Change from baseline, expected to last >3 days Grooming: Independent Feeding: Independent Bathing: Needs assistance Is this a change from baseline?: Change from baseline, expected to last >3 days Toileting: Needs assistance Is this a change from baseline?: Change from baseline, expected to last >3days In/Out Bed: Needs assistance Is this a change from baseline?: Change from baseline, expected to last >3 days Walks in Home: Dependent Is this a change from baseline?: Change from baseline, expected to last >3 days Does the patient have difficulty walking or climbing stairs?: Yes Weakness of Legs: Both Weakness of Arms/Hands: Both  Permission Sought/Granted                  Emotional Assessment Appearance:: Appears stated age Attitude/Demeanor/Rapport: Engaged Affect (typically observed): Calm Orientation: : Oriented to Self, Oriented to Place, Oriented to Situation Alcohol / Substance Use: Never Used Psych Involvement: No (comment)  Admission diagnosis:  Loss of weight [R63.4] Appetite loss [R63.0] Biloma [K66.8] Generalized weakness [R53.1] Intractable pain [R52] Patient Active Problem List   Diagnosis Date Noted   Palliative care encounter    ACP (  advance care planning)    Urothelial cancer ([place])    Metastatic malignant neoplasm ([place])    Intractable pain    Appetite loss    Goals of care, counseling/discussion    Constipation    Biloma    Hypomagnesemia    Generalized weakness 06/13/2022   Failure to thrive in adult 06/13/2022   Loss of weight 06/13/2022   Anorexia 06/13/2022    Hypoalbuminemia due to protein-calorie malnutrition ([place]) 06/13/2022   Hypokalemia 06/13/2022   Dehydration 06/13/2022   Mixed hyperlipidemia 05/15/2022   Symptomatic cholelithiasis 05/13/2022   Abdominal pain 03/29/2022   Type 2 diabetes mellitus with hyperglycemia ([place]) 03/28/2022   AKI (acute kidney injury) ([place]) 03/28/2022   Essential hypertension 03/28/2022   Normocytic anemia 03/28/2022   Right flank pain 03/28/2022   Metastatic renal cell carcinoma ([place]) 03/28/2022   Port-A-Cath in place 07/04/2020   Prostatic hyperplasia 04/13/2020   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 [place] [address] Phone: [phone] Fax: [facility] [address], [place] [address] [place] [address] Phone: [phone] Fax: [phone]     Social Determinants of Health (SDOH) Interventions    Readmission Risk Interventions   Row Labels 03/31/2022    8:32 AM  Readmission Risk Prevention Plan   Section Header. No data exists in this row.   Transportation Screening   Complete  PCP or Specialist Appt within 3-5 Days   Complete  HRI or [place]   Complete  Social Work Consult for [place] Planning/Counseling   Complete  Palliative Care Screening   Not Applicable  Medication Review Press photographer)   Complete

## 2022-06-16 NOTE — Progress Notes (Signed)
Daily Progress Note   Patient Name: Hector Lopez       Date: 06/16/2022 DOB: [birth year]  Age: 76 y.o. MRN#: [id number] Attending Physician: Eugenie Filler, MD Primary Care Physician: [name], MD Admit Date: 06/13/2022  Reason for Consultation/Follow-up: Complex medical decision making and symptom management  Subjective:  Reviewed EMR prior to presenting to bedside.  Presented to bedside in the afternoon.  Patient laying in bed and appears uncomfortable.  Patient's wife and daughter at bedside.  Able to discuss care updates for today.  Patient seen by oncologist to agrees patient appropriate to be discharged home with hospice.  Urology consulted to consider celiac plexus block though as per EMR review it is unlikely patient will benefit from this as the tumor is fairly remote from the celiac plexus.  We will continue to manage pain with oral medications.  ------------------------------------------------------------------------------------------------------------- Advance Care Planning Conversation  Pertinent diagnosis: Stage IV high-grade urothelial cancer  The patient and/or family consented to a voluntary Advance Care Planning Conversation. Individuals present for the conversation: Patient, wife, daughter, palliative care provider  Summary of the conversation: Spent time reviewing patient's goals for care at this time.  Patient still focused on getting home to spend quality time there.  Pain control and symptom management are the priority.  Again explained the concept of hospice and what it would and would not provide.  Patient and family wanting to go home with hospice.  Patient and family note they know a member of the care team with the [facility] and so would  like to use [facility] [place].  Noted would reach out to [facility] regarding assistance with setting up home hospice.  Patient wanting to focus on his comfort at this time.  Noted would discontinue lab work, IV fluids, medications, and imaging that does not can to change his overall prognosis and patient and family agreed with this.  Outcome of the conversations and/or documents completed:  Patient focusing on comfort care at this time.  Discontinued interventions that were no longer focused on patient's all over comfort.  Working to discharge home with hospice.  Patient and family prefer [facility].  I spent 29 minutes providing separately identifiable ACP services with the patient and/or surrogate decision maker in a voluntary, in-person conversation discussing the patient's wishes  and goals as detailed in the above note.  Chelsea Aus, DO Palliative Care Provider  -------------------------------------------------------------------------------------------------------------  Also spent time extensively reviewing patient's symptom burden.  Patient notes his abdominal pain is worse today.  The IV Dilaudid at 0 0.5 mg is no longer managing his pain as it previously was.  He just received an IV dose less than an hour ago and it did not knock down his pain to a controllable level.  At time of EMR review within the past 24 hours patient has received IV Dilaudid 0.5 mg x 4 doses and p.o. Dilaudid 2 mg x 5 doses.  Patient has received approximately 75 OME in the past 24 hours. Based on this and patient's still uncontrolled pain, discussed starting long-acting medication.  Patient and wife noted that his swallowing is becoming more difficult so they are wanting to adjust to liquid medications or other formulations.  Discussed a long-acting medication when having difficulty swallowing may a fentanyl patch.  Discussed starting low-dose fentanyl patch 25 mcg every 72 hours to assist with long-acting pain  management; patient and family agreeable to this.  Discussed increasing IV Dilaudid since it is no longer helping to even knock down patient's pain.  Patient also not feeling any relief from p.o. Dilaudid. Noted would increase this while changing it to a liquid formulation.  Also discussed addition of steroids to assist with inflammation which patient would like to try.  He could receive steroid dose today as long as not too late and then would need to take in the morning on following doses so that it did not affect his sleep cycle.  All questions answered at that time. Updated primary hospitalist and bedside RN regarding discussion with patient and family.  Length of Stay: 3  Current Medications: Scheduled Meds:   acetaminophen  1,000 mg Oral TID   amLODipine  10 mg Oral Q1500   enoxaparin (LOVENOX) injection  40 mg Subcutaneous Q24H   feeding supplement  237 mL Oral TID BM   fentaNYL  1 patch Transdermal Q72H   insulin aspart  0-5 Units Subcutaneous QHS   insulin aspart  0-9 Units Subcutaneous TID WC   insulin glargine-yfgn  10 Units Subcutaneous QHS   lisinopril  40 mg Oral Q1500   pantoprazole  40 mg Oral Daily   polyethylene glycol  17 g Oral BID   senna-docusate  2 tablet Oral BID    PRN Meds: alum & mag hydroxide-simeth, antiseptic oral rinse, HYDROmorphone (DILAUDID) injection, HYDROmorphone HCl, LORazepam **OR** LORazepam **OR** LORazepam, ondansetron **OR** ondansetron (ZOFRAN) IV, polyvinyl alcohol     Physical Exam: General: alert, appears anxious and uncomfortable Eyes: conjunctiva clear, anicteric sclera HENT: normocephalic, atraumatic, dry mucous membranes Cardiovascular: RRR Respiratory: no increase work of breathing noted, not in respiratory distress Abdomen: Tender Extremities: no edema in LE b/l Skin: no rashes or lesions on visible skin Neuro: A&Ox4, following commands easily Psych: appropriately answers all questions  Vital Signs: BP (!) 158/87 (BP Location:  Right Arm)   Pulse 96   Temp 98 F (36.7 C) (Oral)   Resp 18   Ht '5\' 10"'$  (1.778 m)   Wt 63 kg   SpO2 98%   BMI 19.93 kg/m  SpO2: SpO2: 98 % O2 Device: O2 Device: Room Air O2 Flow Rate:    Intake/output summary:  Intake/Output Summary (Last 24 hours) at 06/16/2022 1512 Last data filed at 06/16/2022 0300 Gross per 24 hour  Intake --  Output 200 ml  Net -200  ml    LBM: Last BM Date : 06/14/22 Baseline Weight: Weight: 63.2 kg Most recent weight: Weight: 63 kg    Patient Active Problem List   Diagnosis Date Noted   Palliative care encounter    ACP (advance care planning)    Urothelial cancer ([place])    Metastatic malignant neoplasm ([place])    Intractable pain    Appetite loss    Goals of care, counseling/discussion    Constipation    Biloma    Hypomagnesemia    Generalized weakness 06/13/2022   Failure to thrive in adult 06/13/2022   Loss of weight 06/13/2022   Anorexia 06/13/2022   Hypoalbuminemia due to protein-calorie malnutrition ([place]) 06/13/2022   Hypokalemia 06/13/2022   Dehydration 06/13/2022   Mixed hyperlipidemia 05/15/2022   Symptomatic cholelithiasis 05/13/2022   Abdominal pain 03/29/2022   Type 2 diabetes mellitus with hyperglycemia ([place]) 03/28/2022   AKI (acute kidney injury) ([place]) 03/28/2022   Essential hypertension 03/28/2022   Normocytic anemia 03/28/2022   Right flank pain 03/28/2022   Metastatic renal cell carcinoma ([place]) 03/28/2022   Port-A-Cath in place 07/04/2020   Prostatic hyperplasia 04/13/2020   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 Leonte Horrigan is a 76 y.o. male  with past medical history of stage IV high-grade urothelial cancer diagnosed in 2021, HTN, T2DM, GERD, and HLD who was admitted on 06/13/2022 for management of abdominal pain, generalized weakness, and fatigue. Palliative care consulted to assist with complex medical decision making and symptom management.   Recommendations/Plan: #  Complex medical decision making/ GOC:       -Extensive conversation with patient and family at bedside today.  After further discussions, patient and family have elected to go home with hospice.  Patient's goal is to focus on his comfort at home and the quality time he can spend there.  Discussed that we will discontinue interventions that are no longer focused on comfort such as imaging and lab work; patient agreeable to this.  See HPI for further details.   -Consulted TOC to assist with discharge home with hospice  -Patient is DNR/DNI  # Symptom Management   - Pain, severe acute on chronic pain in the setting on stage IV urothelial cancer with goal to focus on comfort At time of EMR review patient had received IV Dilaudid 0.5 mg x 4 doses in the past 24 hours and p.o. Dilaudid 2 mg x 5 dose in the past 24 hours.  Patient's pain worsening today and not controlled by oral or IV medications.  Over the past 24 hours patient has received approximately 75 OME.  Patient's swallowing is worsening and it is getting difficult to take tablets.  Discussed starting long-acting medication in addition to adjusting short acting medication.  Patient and family agreeing with this and wanting to focus on patient's comfort at this time in order to plan home with hospice.  Discussed with hospitalist providing medications prior to discharged to allow patient to have at least 3-5-day supply until hospice can establish care with patient and use their pharmacy to fill medications. -Continue Tylenol '1000mg'$  TID scheduled -Change IV hydromorphone to 1 mg every 2 hours as needed.  May need to continue further adjustments based on symptom response. -Change p.o. Dilaudid from tablet to liquid as patient having difficulty with swallowing.  Also adjusted dose as patient feels p.o. medication is not controlling pain appropriately.  Denies adverse effects to IV or oral medication such as  confusion or oversedation.  Started hydromorphone 3  mg liquid oral every 3 hours as needed. -Based on patient's overall 24-hour OME requirements, started fentanyl patch 25 mcg every 72 hours scheduled.  Counseled regarding this medication and its appropriate use with patient and family. -Patient denies adverse effects to steroids previously.  Started on dexamethasone 4 mg daily to help with pain caused from inflammation and energy level.  Hope to receive first dose today as long as it is not in the evening as do not want to interrupt sleep cycle.  -Anxiety/agitation, in setting of metastatic cancer with goal to focus on comfort  Patient has previously received Ativan and noted that it has assisted with anxiety.  -Started lorazepam 0.5 mg either oral, sublingual, or IV every 4 hours as needed.  May need to be further adjusted based on patient's symptom burden.  - Constipation  -Increased senna to 2 tabs BID. This can be increased up to 8 tabs in 24 hour period if needed.  -Continue Miralax  - Appetite -Encourage eating for comfort  Prognosis: weeks to months.  Discussed this again with family.  Patient is appropriate for hospice.  Discharge Planning: Consulted TOC.  Spent extensive time discussing with family.  Plan to go home with hospice support.  Patient and family elect to use [facility].  Appreciate TOC's help with coordination.  May be able to discharge as early as tomorrow.  Care plan was discussed with patient, family, bedside RN, hospitalist  Thank you for allowing the Palliative Medicine Team to assist in the care of this patient.  Chelsea Aus, DO Palliative Care Provider [phone]  Please contact Palliative Medicine Team phone at [phone] for questions and concerns.

## 2022-06-16 NOTE — Progress Notes (Signed)
IP PROGRESS NOTE  Subjective:    Patient known to me with advanced urothelial carcinoma of the renal pelvis who was hospitalized for intractable pain on June 13, 2022.  His last anticancer treatment was in June 2023 and has been on hold due to failure to thrive, intractable pain and gallbladder surgery.  He has been describing worsening pain in the last few months and despite multiple measures he continues to have epigastric and right upper quadrant and back pain.  CT scan obtained on 06/13/2022 showed an enlarging 5.4 x 4.7 x 8.0 cm necrotic soft tissue mass anterior to the abdominal wall concerning for the presence of underlying malignancy.  Clinically, he continues to have pain and discomfort and unable to get much rest throughout the weekend.  Objective:  Vital signs in last 24 hours: Temp:  [97.8 F (36.6 C)-99.7 F (37.6 C)] 97.8 F (36.6 C) (10/02 0312) Pulse Rate:  [93-98] 93 (10/02 0312) Resp:  [14-19] 14 (10/02 0312) BP: (128-154)/(67-89) 128/67 (10/02 0312) SpO2:  [97 %-98 %] 97 % (10/02 0312) Weight change:  Last BM Date : 06/14/22  Intake/Output from previous day: 10/01 0701 - 10/02 0700 In: -  Out: 200 [Urine:200] General: Cachectic appearing without distress but appears uncomfortable. Head: Normocephalic atraumatic. Mouth: mucous membranes moist, pharynx normal without lesions Eyes: No scleral icterus.  Pupils are equal and round reactive to light. Resp: clear to auscultation bilaterally without rhonchi or wheezes or dullness to percussion. Cardio: regular rate and rhythm, S1, S2 normal, no murmur, click, rub or gallop GI: soft, non-tender; bowel sounds normal; no masses,  no organomegaly Musculoskeletal: No joint deformity or effusion. Neurological: No motor, sensory deficits.  Intact deep tendon reflexes. Skin: No rashes or lesions.    Lab Results: Recent Labs    06/15/22 0718 06/16/22 0522  WBC 11.2* 11.1*  HGB 7.6* 7.5*  HCT 24.9* 26.1*  PLT 462*  433*    BMET Recent Labs    06/15/22 0718 06/16/22 0522  NA 136 136  K 3.6 3.3*  CL 105 107  CO2 21* 24  GLUCOSE 156* 113*  BUN 17 13  CREATININE 0.87 0.77  CALCIUM 8.7* 8.5*    Studies/Results: No results found.  Medications: I have reviewed the patient's current medications.  Assessment/Plan:  76 year old with:  1.  Urothelial carcinoma diagnosed in 2020 one of the renal pelvis and developed subsequently advanced disease that has progressed on chemotherapy and immunotherapy.  CT scan obtained on 06/13/2022 continues to show an enlarging abdominal mass.  The natural course of this disease was discussed and different salvage therapy options were reviewed.  I am not sure for radiation therapy could offer much palliation given the size of the tumor.  Systemic chemotherapy can potentially shrink the tumor temporarily but he is quite debilitated and I do not believe he can handle side effects associated with chemotherapy.  Based on these findings, I recommended transitioning to palliative care possible comfort care with goals of controlling his pain should be the main reason to do any treatments.  His prognosis is poor with limited life expectancy which was explained to him today.  I agree with the no CODE BLUE status.  2.  Abdominal pain: Likely due to enlarging mass in the abdomen.  His pain regimen continues to be an adequate.  Palliative medicine services are following and will rely on their expertise in controlling his pain.  I will ask radiation oncology as well as interventional radiology to evaluate for possible alternative  modalities to control his pain.  3.  Follow-up on disposition: I recommended hospice involvement upon discharge.  30  minutes were dedicated to this visit.  50% of time was face-to-face.  The time was spent on reviewing  imaging studies, discussing treatment options, discussing differential diagnosis and answering questions regarding future  plan.     LOS: 3 days   Zola Button 06/16/2022, 7:36 AM

## 2022-06-16 NOTE — Progress Notes (Signed)
PROGRESS NOTE    Hector Lopez  QVZ:[id number] DOB: [birth year] DOA: 06/13/2022 PCP: [name], MD    Chief Complaint  Patient presents with   Failure To Thrive    Brief Narrative:  Patient 76 year old gentleman history of laparoscopic cholecystectomy, stage IV urothelial cancer, GERD, diabetes mellitus type 2 presented to the ED with abdominal pain, failure to thrive.  CT showing possible fluid collection in the gallbladder fossa.  CT also consistent with enlarging necrotic soft tissue mass anterior to the abdominal aorta concerning for underlying neoplastic process, large amount of impacted stool within the distal sigmoid colon and rectum.  Patient placed on IV pain medication, IV fluids, electrolyte repletion.  ED contacted patient's primary oncologist Dr. Alen Blew who recommended admission for further inpatient management due to p.o. intolerance and anorexia.  General surgery consulted.   Assessment & Plan:   Principal Problem:   Generalized weakness Active Problems:   Essential hypertension   Abdominal pain   Mixed hyperlipidemia   Failure to thrive in adult   Loss of weight   Anorexia   Hypoalbuminemia due to protein-calorie malnutrition ([facility])   Hypokalemia   Dehydration   Palliative care encounter   ACP (advance care planning)   Urothelial cancer ([place])   Metastatic malignant neoplasm ([facility])   Intractable pain   Appetite loss   Goals of care, counseling/discussion   Constipation   Biloma   Hypomagnesemia  #1 generalized weakness/failure to thrive in adult/hypoalbuminemia/moderate protein calorie malnutrition -Likely secondary to stage IV urothelial cancer. -Patient also noted to be constipated has not had a bowel movement in 5 weeks. -Patient noted with 30 pound weight loss over the past 3 months with poor appetite due to food not tasting well. -Patient placed on nutritional supplementation, dietitian to be consulted. -Diet liberalized to a regular  diet. -Palliative care consulted and following.  2.  Abdominal pain/abdominal mass -CT abdomen and pelvis with enlarging 5.4 x 4.7 x 8 cm necrotic soft tissue mass anterior to the abdominal aorta concerning for presence of underlying neoplastic process. -Patient also constipated, stated has not had a bowel movement in 5 weeks, CT abdomen and pelvis done with large amount of impacted stool within the distal sigmoid colon and rectum. - SMOG enema x2 given with good results.  -Was noted per admitting physician to ED already consulted Dr. Alen Blew. -Hematology/oncology following. -Palliative care following for symptom management and pain medications being adjusted by palliative care.  3.  Constipation -Patient states no bowel movement in 5 weeks. -CT abdomen and pelvis with large amount of impacted stool within the distal sigmoid colon and rectum. -Smog enema x2 given with good results.   -Continue current bowel regimen with MiraLAX twice daily, Senokot-S twice daily.    4.  Stage IV urothelial cancer -Oncology informed of admission. -Palliative care consulted and following  5. ?  Large postop fluid collection versus biloma -Noted on CT abdomen and pelvis. -Patient seen by general surgery who doubt patient has a biloma, HIDA scan ordered and done this morning, results pending.  -Patient started on a diet per general surgery. -Per general surgery.  6.  Dehydration -IV fluids.  7.  Hyperlipidemia -Continue Statin.  8.  Type 2 diabetes mellitus with uncontrolled hyperglycemia -Hemoglobin A1c 10.8 (03/29/2022) -CBG 102 this morning. -Continue Semglee 10 units daily, SSI.    9.  Hypokalemia/hypomagnesemia -Magnesium at 1.8.  Potassium at 3.3.   -Replete potassium.   10.  GERD Continue PPI.  11.  Hypertension -Continue lisinopril,  Norvasc.      DVT prophylaxis: Heparin Code Status: Full Family Communication: Updated patient, wife, daughter at bedside. Disposition: Likely home  with home health when clinically improved versus home with hospice versus SNF  Status is: Inpatient Remains inpatient appropriate because: Severity of illness   Consultants:  General surgery: Dr. Brantley Stage 06/13/2022 Palliative care: Dr. Vinetta Bergamo 06/14/2022  Procedures:  CT abdomen and pelvis 06/13/2022 HIDA scan 06/16/2022  Antimicrobials:  None   Subjective: Just returning from HIDA scan.  Still with complaints of abdominal pain awaiting pain medication.  No chest pain.  No shortness of breath.  Wife and daughter at bedside.   Objective: Vitals:   06/15/22 0513 06/15/22 1400 06/15/22 2058 06/16/22 0312  BP: (!) 159/82 (!) 154/89 (!) 142/82 128/67  Pulse: 94 98 97 93  Resp: '17 19 16 14  '$ Temp: 98.3 F (36.8 C) 99.7 F (37.6 C) 98.2 F (36.8 C) 97.8 F (36.6 C)  TempSrc: Oral Oral Oral Oral  SpO2: 98% 98% 97% 97%  Weight:      Height:        Intake/Output Summary (Last 24 hours) at 06/16/2022 1214 Last data filed at 06/16/2022 0300 Gross per 24 hour  Intake --  Output 200 ml  Net -200 ml    Filed Weights   06/13/22 1638 06/14/22 0133  Weight: 63.2 kg 63 kg    Examination:  General exam: NAD. Respiratory system: Lungs clear to auscultation bilaterally.  No wheezes, no crackles, no rhonchi.  Fair air movement.  Speaking in full sentences.  Cardiovascular system: RRR no murmurs rubs or gallops.  No JVD.  No lower extremity edema.  Gastrointestinal system: Abdomen is soft, no tenderness, some diffuse abdominal pain in the mid abdominal region.  Positive bowel sounds.  No rebound.  No guarding.  Central nervous system: Alert and oriented. No focal neurological deficits. Extremities: Symmetric 5 x 5 power. Skin: No rashes, lesions or ulcers Psychiatry: Judgement and insight appear normal. Mood & affect appropriate.     Data Reviewed: I have personally reviewed following labs and imaging studies  CBC: Recent Labs  Lab 06/13/22 1600 06/14/22 0630 06/15/22 0718  06/16/22 0522  WBC 10.6* 14.8* 11.2* 11.1*  NEUTROABS 8.4*  --  9.3* 9.1*  HGB 8.8* 8.5* 7.6* 7.5*  HCT 28.4* 28.3* 24.9* 26.1*  MCV 81.1 83.5 82.5 87.3  PLT 527* 531* 462* 433*     Basic Metabolic Panel: Recent Labs  Lab 06/13/22 1600 06/14/22 0630 06/15/22 0718 06/16/22 0522  NA 135 137 136 136  K 3.3* 3.7 3.6 3.3*  CL 103 103 105 107  CO2 23 22 21* 24  GLUCOSE 273* 207* 156* 113*  BUN 35* '21 17 13  '$ CREATININE 1.16 0.88 0.87 0.77  CALCIUM 9.2 9.2 8.7* 8.5*  MG 1.8 1.6* 2.1 1.8  PHOS 3.0 3.0  --  2.9     GFR: Estimated Creatinine Clearance: 70 mL/min (by C-G formula based on SCr of 0.77 mg/dL).  Liver Function Tests: Recent Labs  Lab 06/13/22 1600 06/14/22 0630 06/15/22 0718 06/16/22 0522  AST 14* 11* 10*  --   ALT '15 12 11  '$ --   ALKPHOS 133* 126 101  --   BILITOT 0.5 0.7 0.6  --   PROT 7.0 7.0 6.4*  --   ALBUMIN 2.5* 2.6* 2.4* 2.3*     CBG: Recent Labs  Lab 06/15/22 1132 06/15/22 1740 06/15/22 2137 06/16/22 0732 06/16/22 1208  GLUCAP 139* 135* 126* 102* 97  No results found for this or any previous visit (from the past 240 hour(s)).       Radiology Studies: No results found.      Scheduled Meds:  acetaminophen  1,000 mg Oral TID   amLODipine  10 mg Oral Q1500   enoxaparin (LOVENOX) injection  40 mg Subcutaneous Q24H   feeding supplement  237 mL Oral TID BM   insulin aspart  0-5 Units Subcutaneous QHS   insulin aspart  0-9 Units Subcutaneous TID WC   insulin glargine-yfgn  10 Units Subcutaneous QHS   lisinopril  40 mg Oral Q1500   pantoprazole  40 mg Oral Daily   polyethylene glycol  17 g Oral BID   senna-docusate  1 tablet Oral BID   simvastatin  20 mg Oral QHS   Continuous Infusions:     LOS: 3 days    Time spent: 40 minutes    Irine Seal, MD [facility]   To contact the attending provider between 7A-7P or the covering provider during after hours 7P-7A, please log into the web site  [email or web address] and access using universal [place] password for that web site. If you do not have the password, please call the hospital operator.  06/16/2022, 12:14 PM

## 2022-06-17 ENCOUNTER — Ambulatory Visit
Admit: 2022-06-17 | Discharge: 2022-06-17 | Disposition: A | Discharge status: Home / Self Care | Payer: Medicare Other | Attending: Radiation Oncology | Admitting: Radiation Oncology

## 2022-06-17 ENCOUNTER — Ambulatory Visit: Payer: Medicare Other | Admitting: Radiation Oncology

## 2022-06-17 DIAGNOSIS — K668 Other specified disorders of peritoneum: Secondary | ICD-10-CM | POA: Diagnosis not present

## 2022-06-17 DIAGNOSIS — K5900 Constipation, unspecified: Secondary | ICD-10-CM | POA: Diagnosis not present

## 2022-06-17 DIAGNOSIS — R109 Unspecified abdominal pain: Secondary | ICD-10-CM | POA: Diagnosis not present

## 2022-06-17 DIAGNOSIS — R531 Weakness: Secondary | ICD-10-CM | POA: Diagnosis not present

## 2022-06-17 DIAGNOSIS — Z7189 Other specified counseling: Secondary | ICD-10-CM

## 2022-06-17 LAB — BASIC METABOLIC PANEL
Anion gap: 11 (ref 5–15)
BUN: 16 mg/dL (ref 8–23)
CO2: 21 mmol/L — ABNORMAL LOW (ref 22–32)
Calcium: 9 mg/dL (ref 8.9–10.3)
Chloride: 103 mmol/L (ref 98–111)
Creatinine, Ser: 0.88 mg/dL (ref 0.61–1.24)
GFR, Estimated: >60 mL/min (ref >60)
Glucose, Bld: 151 mg/dL — ABNORMAL HIGH (ref 70–99)
Potassium: 3.9 mmol/L (ref 3.5–5.1)
Sodium: 135 mmol/L (ref 135–145)

## 2022-06-17 LAB — CBC WITH DIFFERENTIAL/PLATELET
Abs Immature Granulocytes: 0.14 10*3/uL — ABNORMAL HIGH (ref 0.00–0.07)
Basophils Absolute: 0 10*3/uL (ref 0.0–0.1)
Basophils Relative: 0 %
Eosinophils Absolute: 0 10*3/uL (ref 0.0–0.5)
Eosinophils Relative: 0 %
HCT: 26.3 % — ABNORMAL LOW (ref 39.0–52.0)
Hemoglobin: 7.9 g/dL — ABNORMAL LOW (ref 13.0–17.0)
Immature Granulocytes: 1 %
Lymphocytes Relative: 6 %
Lymphs Abs: 0.7 10*3/uL (ref 0.7–4.0)
MCH: 24.8 pg — ABNORMAL LOW (ref 26.0–34.0)
MCHC: 30 g/dL (ref 30.0–36.0)
MCV: 82.4 fL (ref 80.0–100.0)
Monocytes Absolute: 0.2 10*3/uL (ref 0.1–1.0)
Monocytes Relative: 1 %
Neutro Abs: 10.4 10*3/uL — ABNORMAL HIGH (ref 1.7–7.7)
Neutrophils Relative %: 92 %
Platelets: 482 10*3/uL — ABNORMAL HIGH (ref 150–400)
RBC: 3.19 MIL/uL — ABNORMAL LOW (ref 4.22–5.81)
RDW: 15 % (ref 11.5–15.5)
WBC: 11.4 10*3/uL — ABNORMAL HIGH (ref 4.0–10.5)
nRBC: 0 % (ref 0.0–0.2)

## 2022-06-17 LAB — GLUCOSE, CAPILLARY
Glucose-Capillary: 158 mg/dL — ABNORMAL HIGH (ref 70–99)
Glucose-Capillary: 170 mg/dL — ABNORMAL HIGH (ref 70–99)
Glucose-Capillary: 186 mg/dL — ABNORMAL HIGH (ref 70–99)
Glucose-Capillary: 234 mg/dL — ABNORMAL HIGH (ref 70–99)

## 2022-06-17 NOTE — Progress Notes (Signed)
PROGRESS NOTE    Hector Lopez  KCL:[id number] DOB: [birth year] DOA: 06/13/2022 PCP: [name], MD    Chief Complaint  Patient presents with   Failure To Thrive    Brief Narrative:  Patient 76 year old gentleman history of laparoscopic cholecystectomy, stage IV urothelial cancer, GERD, diabetes mellitus type 2 presented to the ED with abdominal pain, failure to thrive.  CT showing possible fluid collection in the gallbladder fossa.  CT also consistent with enlarging necrotic soft tissue mass anterior to the abdominal aorta concerning for underlying neoplastic process, large amount of impacted stool within the distal sigmoid colon and rectum.  Patient placed on IV pain medication, IV fluids, electrolyte repletion.  ED contacted patient's primary oncologist Dr. Alen Blew who recommended admission for further inpatient management due to p.o. intolerance and anorexia.  General surgery consulted.   Assessment & Plan:   Principal Problem:   Generalized weakness Active Problems:   Essential hypertension   Abdominal pain   Mixed hyperlipidemia   Failure to thrive in adult   Loss of weight   Anorexia   Hypoalbuminemia due to protein-calorie malnutrition (HCC)   Hypokalemia   Dehydration   Palliative care encounter   ACP (advance care planning)   Urothelial cancer ([place])   Metastatic malignant neoplasm (HCC)   Intractable pain   Appetite loss   Goals of care, counseling/discussion   Constipation   Biloma   Hypomagnesemia  #1 generalized weakness/failure to thrive in adult/hypoalbuminemia/moderate protein calorie malnutrition -Likely secondary to stage IV urothelial cancer. -Patient also noted to be constipated has not had a bowel movement in 5 weeks. -Patient noted with 30 pound weight loss over the past 3 months with poor appetite due to food not tasting well. -Patient placed on nutritional supplementation, dietitian to be consulted. -Diet liberalized to a regular  diet. -Palliative care consulted and following. -Patient likely home with hospice hopefully tomorrow as pain control has improved.  2.  Abdominal pain/abdominal mass -CT abdomen and pelvis with enlarging 5.4 x 4.7 x 8 cm necrotic soft tissue mass anterior to the abdominal aorta concerning for presence of underlying neoplastic process. -Patient also constipated, stated has not had a bowel movement in 5 weeks, CT abdomen and pelvis done with large amount of impacted stool within the distal sigmoid colon and rectum. - SMOG enema x2 given with good results.  -Was noted per admitting physician to ED already consulted Dr. Alen Blew. -Hematology/oncology following. -Palliative care following for symptom management and pain medications being adjusted by palliative care. -Pain currently better controlled today per patient.  3.  Constipation -Patient states no bowel movement in 5 weeks prior to admission. -CT abdomen and pelvis with large amount of impacted stool within the distal sigmoid colon and rectum. -Smog enema x2 given with good results.   -Continue current bowel regimen with MiraLAX twice daily, Senokot-S twice daily.    4.  Stage IV urothelial cancer -Oncology informed of admission. -Palliative care consulted and following and decision made to likely discharge patient home with hospice following. -TOC consulted to set up home hospice. -Pain currently being managed by palliative care. -Palliative care following and appreciate input and recommendations.  5. ?  Large postop fluid collection versus biloma -Noted on CT abdomen and pelvis. -Patient seen by general surgery who doubt patient has a biloma, HIDA scan ordered and done and negative for biloma.  -Patient started on a diet per general surgery. -General surgery was following but have signed off.  6.  Dehydration -IV fluids  saline locked.   7.  Hyperlipidemia -Statin.  8.  Type 2 diabetes mellitus with uncontrolled  hyperglycemia -Hemoglobin A1c 10.8 (03/29/2022) -CBG 158 this morning. -Continue Semglee 10 units daily, SSI.    9.  Hypokalemia/hypomagnesemia -Repleted.  10.  GERD PPI.   11.  Hypertension -Controlled on current regimen of Norvasc, lisinopril.      DVT prophylaxis: Heparin Code Status: Full Family Communication: Updated patient, no family at bedside.   Disposition: Likely home with hospice tomorrow once home hospice has been set up.    Status is: Inpatient Remains inpatient appropriate because: Severity of illness   Consultants:  General surgery: Dr. Brantley Stage 06/13/2022 Palliative care: Dr. Vinetta Bergamo 06/14/2022  Procedures:  CT abdomen and pelvis 06/13/2022 HIDA scan 06/16/2022  Antimicrobials:  None   Subjective: Laying in bed.  States pain is currently controlled and using no pain today.  Denies any chest pain no shortness of breath.  Stated he spoke with a doctor this morning may have been the radiation oncologist and is supposed to be set up for possible radiation tomorrow, not sure what is going on.    Objective: Vitals:   06/16/22 0312 06/16/22 1502 06/16/22 1925 06/17/22 0452  BP: 128/67 (!) 158/87 (!) 140/83 136/73  Pulse: 93 96 97 97  Resp: '14 18 18 18  '$ Temp: 97.8 F (36.6 C) 98 F (36.7 C) 98.9 F (37.2 C) 98.5 F (36.9 C)  TempSrc: Oral Oral Oral Oral  SpO2: 97% 98% 98% 98%  Weight:      Height:        Intake/Output Summary (Last 24 hours) at 06/17/2022 1250 Last data filed at 06/17/2022 0520 Gross per 24 hour  Intake 120 ml  Output 475 ml  Net -355 ml    Filed Weights   06/13/22 1638 06/14/22 0133  Weight: 63.2 kg 63 kg    Examination:  General exam: NAD. Respiratory system: CTA B.  No wheezes, no crackles, no rhonchi.  Fair air movement.  Speaking in full sentences.  Cardiovascular system: Regular rate rhythm no murmurs rubs or gallops.  No JVD.  No lower extremity edema.   Gastrointestinal system: Abdomen is soft, nontender, nondistended,  positive bowel sounds.  No rebound.  No guarding.   Central nervous system: Alert and oriented. No focal neurological deficits. Extremities: Symmetric 5 x 5 power. Skin: No rashes, lesions or ulcers Psychiatry: Judgement and insight appear normal. Mood & affect appropriate.     Data Reviewed: I have personally reviewed following labs and imaging studies  CBC: Recent Labs  Lab 06/13/22 1600 06/14/22 0630 06/15/22 0718 06/16/22 0522 06/17/22 0537  WBC 10.6* 14.8* 11.2* 11.1* 11.4*  NEUTROABS 8.4*  --  9.3* 9.1* 10.4*  HGB 8.8* 8.5* 7.6* 7.5* 7.9*  HCT 28.4* 28.3* 24.9* 26.1* 26.3*  MCV 81.1 83.5 82.5 87.3 82.4  PLT 527* 531* 462* 433* 482*     Basic Metabolic Panel: Recent Labs  Lab 06/13/22 1600 06/14/22 0630 06/15/22 0718 06/16/22 0522 06/17/22 0537  NA 135 137 136 136 135  K 3.3* 3.7 3.6 3.3* 3.9  CL 103 103 105 107 103  CO2 23 22 21* 24 21*  GLUCOSE 273* 207* 156* 113* 151*  BUN 35* '21 17 13 16  '$ CREATININE 1.16 0.88 0.87 0.77 0.88  CALCIUM 9.2 9.2 8.7* 8.5* 9.0  MG 1.8 1.6* 2.1 1.8  --   PHOS 3.0 3.0  --  2.9  --      GFR: Estimated Creatinine Clearance: 63.6 mL/min (  by C-G formula based on SCr of 0.88 mg/dL).  Liver Function Tests: Recent Labs  Lab 06/13/22 1600 06/14/22 0630 06/15/22 0718 06/16/22 0522  AST 14* 11* 10*  --   ALT '15 12 11  '$ --   ALKPHOS 133* 126 101  --   BILITOT 0.5 0.7 0.6  --   PROT 7.0 7.0 6.4*  --   ALBUMIN 2.5* 2.6* 2.4* 2.3*     CBG: Recent Labs  Lab 06/16/22 1208 06/16/22 1736 06/16/22 2145 06/17/22 0734 06/17/22 1203  GLUCAP 97 147* 107* 158* 170*      No results found for this or any previous visit (from the past 240 hour(s)).       Radiology Studies: NM HEPATOBILIARY LEAK (POST-SURGICAL)  Result Date: 06/16/2022 CLINICAL DATA:  Postop abdominal pain. Cholecystectomy on 05/14/2022. Concern for bile leak. EXAM: NUCLEAR MEDICINE HEPATOBILIARY IMAGING TECHNIQUE: Sequential images of the abdomen were  obtained out to 60 minutes following intravenous administration of radiopharmaceutical. RADIOPHARMACEUTICALS:  5.3 mCi Tc-26m Choletec IV COMPARISON:  None Available. FINDINGS: Prompt clearance of radiotracer from blood pool and homogeneous uptake in liver. Counts are evident in the common bile duct and small bowel by 15 minutes. Counts continue to empty into the small bowel of course the study. There is no extrabiliary collection of radiotracer to suggest bile leak or biloma. IMPRESSION: No bile leak or biloma. Patent common bile duct. Electronically Signed   By: SSuzy BouchardM.D.   On: 06/16/2022 12:19        Scheduled Meds:  acetaminophen  1,000 mg Oral TID   amLODipine  10 mg Oral Q1500   dexamethasone  4 mg Oral Daily   enoxaparin (LOVENOX) injection  40 mg Subcutaneous Q24H   feeding supplement  237 mL Oral TID BM   fentaNYL  1 patch Transdermal Q72H   insulin aspart  0-5 Units Subcutaneous QHS   insulin aspart  0-9 Units Subcutaneous TID WC   insulin glargine-yfgn  10 Units Subcutaneous QHS   lisinopril  40 mg Oral Q1500   pantoprazole  40 mg Oral Daily   polyethylene glycol  17 g Oral BID   senna-docusate  2 tablet Oral BID   Continuous Infusions:     LOS: 4 days    Time spent: 40 minutes    DIrine Seal MD [facility]   To contact the attending provider between 7A-7P or the covering provider during after hours 7P-7A, please log into the web site [email or web address] and access using universal [place] password for that web site. If you do not have the password, please call the hospital operator.  06/17/2022, 12:50 PM

## 2022-06-17 NOTE — TOC Progression Note (Signed)
Transition of Care [facility]) - Progression Note    Patient Details  Name: Hector Lopez MRN: [id number] Date of Birth: 05-25-1946  Transition of Care [facility]) CM/SW Contact  Ross Ludwig, [place] Phone Number: 06/17/2022, 12:59 PM  Clinical Narrative:     CSW received consult that patient and family have decided to go home with hospice after meeting with palliative team yesterday.  CSW spoke to patient's wife Hector Lopez [phone] they would like to use [facility].  Per patient's wife they will transport him back home.  They will need bedside commode, rolling walker, and wheelchair.  CSW spoke to [place] at [facility], she requested to have clinicals sent through the [employer].  CSW made referral to [place], they will review and contact patient's wife.  CSW to continue to follow patient's progress throughout discharge planning.     Expected Discharge Plan: Home/Self Care Barriers to Discharge: Continued Medical Work up  Expected Discharge Plan and Services Expected Discharge Plan: Home/Self Care       Living arrangements for the past 2 months: Single Family Home                                       Social Determinants of Health (SDOH) Interventions    Readmission Risk Interventions    06/16/2022   10:51 AM 03/31/2022    8:32 AM  Readmission Risk Prevention Plan  Transportation Screening Complete Complete  PCP or Specialist Appt within 3-5 Days  Complete  HRI or [place]  Complete  Social Work Consult for [place] Planning/Counseling  Complete  Palliative Care Screening  Not Applicable  Medication Review [employer]) Complete Complete  PCP or Specialist appointment within 3-5 days of discharge Complete   HRI or [place] Complete   SW Recovery Care/Counseling Consult Complete   [place] Not Applicable

## 2022-06-17 NOTE — Progress Notes (Signed)
Daily Progress Note   Patient Name: Hector Lopez       Date: 06/17/2022 DOB: [birth year]  Age: 76 y.o. MRN#: [id number] Attending Physician: Eugenie Filler, MD Primary Care Physician: [name], MD Admit Date: 06/13/2022  Reason for Consultation/Follow-up: Complex medical decision making and symptom management  Subjective: Abdominal pain much improved today.  Reviewed EMR prior to presenting to bedside.  Radiation oncology had come to see the patient today to discuss palliative radiation for pain management.   Presented to bedside in the afternoon along with patient's hospitalist, Dr. [place] Silos.  Patient able to state that his abdominal pain is much improved today in fact he states "no pain".  He has a fentanyl in place and has received IV Dilaudid 1 mg x 3 doses and 0 doses of p.o. liquid Dilaudid.  Patient was confused regarding visit from radiation oncology this morning.  Able to discuss with patient again goals of care and that we had discussed radiation is a palliative measure and not a curative measure.  Discussed that radiation can have its own adverse effects and involve the patient going back and forth to appointments which he and family have stated he would not want to do.  During this discussion and explanation, patient voicing he still wants to continue with the plan to get home with hospice to enjoy quality time with family.  Hospitalist going to follow-up with social worker regarding coordination of care.  Spent time with patient to allow him to voice frustrations regarding confusing visit this morning.  Acknowledged  ifficulties that can occur with lack of communication and sorry that this visit confused him at all.  We discussed hope that he can get home with hospice as soon  as possible with his pain improved to allow quality time with family.  Patient wants to get home to enjoy beautiful days, like today. Explained to patient that as palliative care needs currently met, patient will likely be discharged with hospice tomorrow.  Patient will continue to receive the fentanyl and oral Dilaudid he has been receiving here at time of discharge and hospice will continue to manage once he is at home.  Patient agreement with plan.  Coordinated care with hospitalist and social worker after visit with patient.  Also informed radiation oncology of patient's goals for medical care  still being focused on getting home with hospice as soon as able.  Also called and spoke with patient's wife after visit since she was not at patient's bedside today.  She noted that she was "surprised" to hear from the radiation oncology team this morning though again does not want patient to go through radiation knowing it is not a curative therapy and can cause side effects to patient.  She notes that as long as patient's pain is improved on the medications, which it currently is, she wants to continue focusing on getting him home with hospice.  Agreed with her and that patient has stated the same.  All questions answered at that time.  Length of Stay: 4  Current Medications: Scheduled Meds:   acetaminophen  1,000 mg Oral TID   amLODipine  10 mg Oral Q1500   dexamethasone  4 mg Oral Daily   enoxaparin (LOVENOX) injection  40 mg Subcutaneous Q24H   feeding supplement  237 mL Oral TID BM   fentaNYL  1 patch Transdermal Q72H   insulin aspart  0-5 Units Subcutaneous QHS   insulin aspart  0-9 Units Subcutaneous TID WC   insulin glargine-yfgn  10 Units Subcutaneous QHS   lisinopril  40 mg Oral Q1500   pantoprazole  40 mg Oral Daily   polyethylene glycol  17 g Oral BID   senna-docusate  2 tablet Oral BID    PRN Meds: alum & mag hydroxide-simeth, antiseptic oral rinse, HYDROmorphone (DILAUDID)  injection, HYDROmorphone HCl, LORazepam **OR** LORazepam **OR** LORazepam, ondansetron **OR** ondansetron (ZOFRAN) IV, polyvinyl alcohol     Physical Exam: General: alert, laying in bed, appears comfortable Eyes: conjunctiva clear, anicteric sclera HENT: normocephalic, atraumatic, moist mucous membranes Cardiovascular: RRR Respiratory: no increase work of breathing noted, not in respiratory distress Abdomen: Not tender Extremities: no edema in LE b/l Skin: no rashes or lesions on visible skin Neuro: Interactive, asking questions Psych: Anxious at times  Vital Signs: BP 136/73 (BP Location: Right Arm)   Pulse 97   Temp 98.5 F (36.9 C) (Oral)   Resp 18   Ht 5' 10"  (1.778 m)   Wt 63 kg   SpO2 98%   BMI 19.93 kg/m  SpO2: SpO2: 98 % O2 Device: O2 Device: Room Air O2 Flow Rate:    Intake/output summary:  Intake/Output Summary (Last 24 hours) at 06/17/2022 1252 Last data filed at 06/17/2022 4315 Gross per 24 hour  Intake 120 ml  Output 475 ml  Net -355 ml   LBM: Last BM Date : 06/14/22 Baseline Weight: Weight: 63.2 kg Most recent weight: Weight: 63 kg    Patient Active Problem List   Diagnosis Date Noted   Palliative care encounter    ACP (advance care planning)    Urothelial cancer ([place])    Metastatic malignant neoplasm ([place])    Intractable pain    Appetite loss    Goals of care, counseling/discussion    Constipation    Biloma    Hypomagnesemia    Generalized weakness 06/13/2022   Failure to thrive in adult 06/13/2022   Loss of weight 06/13/2022   Anorexia 06/13/2022   Hypoalbuminemia due to protein-calorie malnutrition ([place]) 06/13/2022   Hypokalemia 06/13/2022   Dehydration 06/13/2022   Mixed hyperlipidemia 05/15/2022   Symptomatic cholelithiasis 05/13/2022   Abdominal pain 03/29/2022   Type 2 diabetes mellitus with hyperglycemia ([place]) 03/28/2022   AKI (acute kidney injury) ([place]) 03/28/2022   Essential hypertension 03/28/2022   Normocytic anemia 03/28/2022  Right flank pain 03/28/2022   Metastatic renal cell carcinoma ([place]) 03/28/2022   Port-A-Cath in place 07/04/2020   Prostatic hyperplasia 04/13/2020   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 Hector Lopez is a 76 y.o. male  with past medical history of stage IV high-grade urothelial cancer diagnosed in 2021, HTN, T2DM, GERD, and HLD who was admitted on 06/13/2022 for management of abdominal pain, generalized weakness, and fatigue. Palliative care consulted to assist with complex medical decision making and symptom management.   Recommendations/Plan: # Complex medical decision making/ GOC:       -Confirmed with patient and wife again today that plan is for patient to go home with hospice.  Focusing on patient's comfort at the end of life.  To go home with [facility] [place] as soon as able.  Cancel outpatient follow-ups as care will be managed by hospice.  -Patient is DNR/DNI  # Symptom Management   - Pain, severe acute on chronic pain in the setting on stage IV urothelial cancer with goal to focus on comfort At time of EMR review patient had received IV Dilaudid 1 mg mg x 3 doses in the past 24 hours and p.o. Dilaudid 3 mg x 0 doses in the past 24 hours.  Patient denies having any pain at time of visit and feels current pain regiment is appropriate for him. -Continue Tylenol 1083m TID scheduled -Continue IV hydromorphone to 1 mg every 2 hours as needed during hospitalization. -Continue p.o.hydromorphone 3 mg liquid oral every 3 hours as needed.  Courage patient to use this over IV medication as this is the medication he can be discharged home on. -Continue fentanyl patch 25 mcg every 72 hours scheduled as assisting with long-acting pain management. -Can continue dexamethasone 4 mg daily to help with pain caused from inflammation and energy level.   -Please provide patient with prescription for Tylenol, liquid hydromorphone, fentanyl patch, and  dexamethasone at time of discharge for at least a 3-day supply until hospice can take over management.  -Anxiety/agitation, in setting of metastatic cancer with goal to focus on comfort  -Continue lorazepam 0.5 mg either sublingual every 4 hours as needed at time of discharge in addition to opioid medications for management.  - Constipation  -Receiving senna to 2 tabs BID. This can be increased up to 8 tabs in 24 hour period if needed.  -Continue Miralax -Continue bowel regimen on discharge.  - Appetite -Encourage eating for comfort  Prognosis: weeks to months.  Discussed this again with family.  Patient is appropriate for hospice.  Discharge Planning: Planning to go [facility] [place] as can be established and hospice delivers DME.  Care plan was discussed with patient, wife, bedside RN, hospitalist, social worker, rad onc  Thank you for allowing the Palliative Medicine Team to assist in the care of this patient.  As goals for medical care are currently clear and symptoms are currently well managed, would recommend to plan discharge medications as stated above.  Please reach out if the palliative care team can be of further assistance.  LChelsea Aus DO Palliative Care Provider [phone] This provider spent a total of 51 minutes providing patient's care.  Includes review of EMR, discussing care with other staff members involved in patient's medical care, obtaining relevant history and information from patient and/or patient's family, and coordination of care. Greater than 50% of the time was spent counseling and coordinating care related  to the above assessment and plan.    Please contact Palliative Medicine Team phone at [phone] for questions and concerns.

## 2022-06-17 NOTE — Progress Notes (Signed)
Mobility Specialist - Progress Note   06/17/22 1312  Mobility  Activity Ambulated with assistance in hallway  Activity Response Tolerated well  Distance Ambulated (ft) 350 ft  $Mobility charge 1 Mobility  Level of Assistance Independent after set-up  Assistive Device Front wheel walker  Mobility Referral Yes   Pt received in bed and agreed for mobility, some pain in abdomen 5/10 progressed to 6/10 nearing EOS. Pt back to bed with all needs met.   Roderick Pee Mobility Specialist

## 2022-06-17 NOTE — Progress Notes (Signed)
Chaplain provided follow up support to Hector Lopez.  He is frustrated and stated that he is feeling depressed for the first time through this journey.  He feels like he is getting different views from each provider and he wants for someone to sit down and lay out all of his options. His main goals are to 1) Not be afraid and 2) to be minimize his medications as much as possible.  He did not feel up to talking much this morning and didn't want to feel like he was having to entertain anyone.  [address] [place] Pager, [phone]
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
## 2022-06-18 ENCOUNTER — Ambulatory Visit: Payer: Medicare Other | Admitting: Radiation Oncology

## 2022-06-18 DIAGNOSIS — R531 Weakness: Secondary | ICD-10-CM | POA: Diagnosis not present

## 2022-06-18 DIAGNOSIS — R630 Anorexia: Secondary | ICD-10-CM | POA: Diagnosis not present

## 2022-06-18 DIAGNOSIS — Z7189 Other specified counseling: Secondary | ICD-10-CM | POA: Diagnosis not present

## 2022-06-18 LAB — GLUCOSE, CAPILLARY
Glucose-Capillary: 185 mg/dL — ABNORMAL HIGH (ref 70–99)
Glucose-Capillary: 99 mg/dL (ref 70–99)
Glucose-Capillary: 180 mg/dL — ABNORMAL HIGH (ref 70–99)
Glucose-Capillary: 213 mg/dL — ABNORMAL HIGH (ref 70–99)

## 2022-06-18 NOTE — Hospital Course (Signed)
76 year old gentleman history of laparoscopic cholecystectomy, stage IV urothelial cancer, GERD, diabetes mellitus type 2 presented to the ED with abdominal pain, failure to thrive.  CT showing possible fluid collection in the gallbladder fossa.  CT also consistent with enlarging necrotic soft tissue mass anterior to the abdominal aorta concerning for underlying neoplastic process, large amount of impacted stool within the distal sigmoid colon and rectum.  Patient placed on IV pain medication, IV fluids, electrolyte repletion.  ED contacted patient's primary oncologist Dr. Alen Blew who recommended admission for further inpatient management due to p.o. intolerance and anorexia.  General surgery consulted.

## 2022-06-18 NOTE — Progress Notes (Signed)
Progress Note   Patient: Hector Lopez DGL:[id number] DOB: April 22, 1946 DOA: 06/13/2022     5 DOS: the patient was seen and examined on 06/18/2022   Brief hospital course: 76 year old gentleman history of laparoscopic cholecystectomy, stage IV urothelial cancer, GERD, diabetes mellitus type 2 presented to the ED with abdominal pain, failure to thrive.  CT showing possible fluid collection in the gallbladder fossa.  CT also consistent with enlarging necrotic soft tissue mass anterior to the abdominal aorta concerning for underlying neoplastic process, large amount of impacted stool within the distal sigmoid colon and rectum.  Patient placed on IV pain medication, IV fluids, electrolyte repletion.  ED contacted patient's primary oncologist Dr. Alen Blew who recommended admission for further inpatient management due to p.o. intolerance and anorexia.  General surgery consulted.  Assessment and Plan: #1 generalized weakness/failure to thrive in adult/hypoalbuminemia/moderate protein calorie malnutrition -Likely secondary to stage IV urothelial cancer. -Patient also noted to be constipated has not had a bowel movement in 5 weeks. -Patient noted with 30 pound weight loss over the past 3 months with poor appetite due to food not tasting well. -Patient placed on nutritional supplementation, dietitian to be consulted. -Diet liberalized to a regular diet. -Palliative care consulted and following. -Patient likely home with hospice, pending. TOC following   2.  Abdominal pain/abdominal mass -CT abdomen and pelvis with enlarging 5.4 x 4.7 x 8 cm necrotic soft tissue mass anterior to the abdominal aorta concerning for presence of underlying neoplastic process. -Patient also constipated, stated has not had a bowel movement in 5 weeks, CT abdomen and pelvis done with large amount of impacted stool within the distal sigmoid colon and rectum. - SMOG enema x2 given with good results.  -Was noted per admitting physician  to ED already consulted Dr. Alen Blew. -Hematology/oncology following. -Palliative care following for symptom management and pain medications being adjusted by palliative care. -Pain currently better controlled today per patient.   3.  Constipation -Patient states no bowel movement in 5 weeks prior to admission. -CT abdomen and pelvis with large amount of impacted stool within the distal sigmoid colon and rectum. -Smog enema x2 given with good results.   -Continue current bowel regimen with MiraLAX twice daily, Senokot-S twice daily.     4.  Stage IV urothelial cancer -Oncology informed of admission. -Palliative care consulted and following and decision made to likely discharge patient home with hospice following. -TOC consulted to set up home hospice. -Pain currently being managed by palliative care. -Palliative care following   5. ?  Large postop fluid collection versus biloma -Noted on CT abdomen and pelvis. -Patient seen by general surgery who doubt patient has a biloma, HIDA scan ordered and done and negative for biloma.  -Patient started on a diet per general surgery. -General surgery was following but have signed off.   6.  Dehydration -IV fluids saline locked.    7.  Hyperlipidemia -Statin.   8.  Type 2 diabetes mellitus with uncontrolled hyperglycemia -Hemoglobin A1c 10.8 (03/29/2022) -CBG 158 this morning. -Continue Semglee 10 units daily, SSI.     9.  Hypokalemia/hypomagnesemia -Repleted.   10.  GERD PPI.    11.  Hypertension -Controlled on current regimen of Norvasc, lisinopril.        Subjective: Reports feeling sad  Physical Exam: Vitals:   06/17/22 0452 06/17/22 1435 06/18/22 0705 06/18/22 1324  BP: 136/73 130/82 (!) 160/85 (!) 142/91  Pulse: 97 93 93 95  Resp: '18 17 20 14  '$ Temp: 98.5 F (  36.9 C) 97.6 F (36.4 C) 97.9 F (36.6 C) 98.3 F (36.8 C)  TempSrc: Oral Oral Oral Oral  SpO2: 98% 96% 97% 94%  Weight:      Height:       General exam:  Awake, laying in bed, in nad Respiratory system: Normal respiratory effort, no wheezing Cardiovascular system: regular rate, s1, s2 Gastrointestinal system: Soft, nondistended, positive BS Central nervous system: CN2-12 grossly intact, strength intact Extremities: Perfused, no clubbing Skin: Normal skin turgor, no notable skin lesions seen Psychiatry: Mood normal // no visual hallucinations   Data Reviewed:  There are no new results to review at this time.  Family Communication: Pt in room, family not at bedside  Disposition: Status is: Inpatient Remains inpatient appropriate because: Severity of illness  Planned Discharge Destination:  home hospice    Author: Marylu Lund, MD 06/18/2022 6:05 PM  For on call review [email or web address].

## 2022-06-18 NOTE — Progress Notes (Signed)
Mobility Specialist - Progress Note   06/18/22 0958  Mobility  Activity Ambulated with assistance to bathroom  Activity Response Tolerated fair  Distance Ambulated (ft) 20 ft  $Mobility charge 1 Mobility  Level of Assistance Contact guard assist, steadying assist  Assistive Device Front wheel walker   Pt received EOB and agreed to ambulate but only to restroom, after urine, pt felt nauseated. Pt back to bed with all needs met and RN notified of session.   Roderick Pee Mobility Specialist

## 2022-06-19 DIAGNOSIS — R531 Weakness: Secondary | ICD-10-CM | POA: Diagnosis not present

## 2022-06-19 DIAGNOSIS — Z7189 Other specified counseling: Secondary | ICD-10-CM | POA: Diagnosis not present

## 2022-06-19 DIAGNOSIS — R630 Anorexia: Secondary | ICD-10-CM | POA: Diagnosis not present

## 2022-06-19 LAB — GLUCOSE, CAPILLARY
Glucose-Capillary: 212 mg/dL — ABNORMAL HIGH (ref 70–99)
Glucose-Capillary: 161 mg/dL — ABNORMAL HIGH (ref 70–99)
Glucose-Capillary: 203 mg/dL — ABNORMAL HIGH (ref 70–99)
Glucose-Capillary: 192 mg/dL — ABNORMAL HIGH (ref 70–99)

## 2022-06-19 NOTE — Consult Note (Signed)
   [facility] Inpatient Consult   06/19/2022  Jaden Abreu 28-Apr-1946 [id number]  [facility] [ACO] Patient: Medicare [facility] Liaison remote coverage review for Hector Lopez with noted unplanned readmission risk with less than 30 days readmission  Primary Care Provider: [name], MD with [facility]  Chart reviewed and reveals the patient is currently transitioning to Hospice/Palliative Care per electronic medical record progress notes of MD, Palliative consult, and Inpatient Transition of Care team notes.   Plan: Given this choice patient will have full case management services through Hospice and needs will be met at the hospice level of care. No [facility] Care Management is planned for transitional needs. Will sign off when disposition is finalized and/or bed availability per inpatient TOC notes.  For questions,   Natividad Brood, RN BSN [place]  [phone] business mobile phone Toll free office [phone]  *[place]  [phone] Fax number: [phone] [email or web address]

## 2022-06-19 NOTE — Progress Notes (Signed)
Progress Note   Patient: Hector Lopez YQM:[id number] DOB: July 06, 1946 DOA: 06/13/2022     6 DOS: the patient was seen and examined on 06/19/2022   Brief hospital course: 76 year old gentleman history of laparoscopic cholecystectomy, stage IV urothelial cancer, GERD, diabetes mellitus type 2 presented to the ED with abdominal pain, failure to thrive.  CT showing possible fluid collection in the gallbladder fossa.  CT also consistent with enlarging necrotic soft tissue mass anterior to the abdominal aorta concerning for underlying neoplastic process, large amount of impacted stool within the distal sigmoid colon and rectum.  Patient placed on IV pain medication, IV fluids, electrolyte repletion.  ED contacted patient's primary oncologist Dr. Alen Blew who recommended admission for further inpatient management due to p.o. intolerance and anorexia.  General surgery consulted.  Assessment and Plan: #1 generalized weakness/failure to thrive in adult/hypoalbuminemia/moderate protein calorie malnutrition -Likely secondary to stage IV urothelial cancer. -Patient also noted to be constipated has not had a bowel movement in 5 weeks. -Patient noted with 30 pound weight loss over the past 3 months with poor appetite due to food not tasting well. -Patient placed on nutritional supplementation, dietitian to be consulted. -Diet liberalized to a regular diet. -Palliative care consulted and following. -Anticipating home with hospice, pending. TOC following   2.  Abdominal pain/abdominal mass -CT abdomen and pelvis with enlarging 5.4 x 4.7 x 8 cm necrotic soft tissue mass anterior to the abdominal aorta concerning for presence of underlying neoplastic process. -Patient also constipated, stated has not had a bowel movement in 5 weeks, CT abdomen and pelvis done with large amount of impacted stool within the distal sigmoid colon and rectum. - SMOG enema x2 given with good results.  -Was noted per admitting physician to  ED already consulted Dr. Alen Blew. -Hematology/oncology following. -Palliative care following for symptom management and pain medications being adjusted by palliative care. -Pain currently better controlled today per patient.   3.  Constipation -Patient states no bowel movement in 5 weeks prior to admission. -CT abdomen and pelvis with large amount of impacted stool within the distal sigmoid colon and rectum. -Smog enema x2 given with good results.   -Continue current bowel regimen with MiraLAX twice daily, Senokot-S twice daily.     4.  Stage IV urothelial cancer -Oncology informed of admission. -Palliative care consulted and following and decision made to likely discharge patient home with hospice following. -TOC consulted to set up home hospice. -Pain currently being managed by palliative care. -Palliative care following   5. ?  Large postop fluid collection versus biloma -Noted on CT abdomen and pelvis. -Patient seen by general surgery who doubt patient has a biloma, HIDA scan ordered and done and negative for biloma.  -Patient started on a diet per general surgery. -General surgery was following but have signed off.   6.  Dehydration -IV fluids saline locked.    7.  Hyperlipidemia -Statin.   8.  Type 2 diabetes mellitus with uncontrolled hyperglycemia -Hemoglobin A1c 10.8 (03/29/2022) -CBG 158 this morning. -Continue Semglee 10 units daily, SSI.     9.  Hypokalemia/hypomagnesemia -Repleted.   10.  GERD PPI.    11.  Hypertension -Controlled on current regimen of Norvasc, lisinopril.        Subjective: Without complaints per pt, however family reports pt is more anxious today  Physical Exam: Vitals:   06/18/22 0705 06/18/22 1324 06/19/22 0704 06/19/22 1347  BP: (!) 160/85 (!) 142/91 (!) 145/83 138/81  Pulse: 93 95 91 95  Resp: '20 14 18 16  '$ Temp: 97.9 F (36.6 C) 98.3 F (36.8 C) 98.4 F (36.9 C) 98.5 F (36.9 C)  TempSrc: Oral Oral Oral Oral  SpO2: 97% 94%  99% 96%  Weight:      Height:       General exam: Conversant, in no acute distress Respiratory system: normal chest rise, clear, no audible wheezing Cardiovascular system: regular rhythm, s1-s2 Gastrointestinal system: Nondistended, nontender, pos BS Central nervous system: No seizures, no tremors Extremities: No cyanosis, no joint deformities Skin: No rashes, no pallor Psychiatry: Affect normal // no auditory hallucinations   Data Reviewed:  There are no new results to review at this time.  Family Communication: Pt in room, family not at bedside  Disposition: Status is: Inpatient Remains inpatient appropriate because: Severity of illness  Planned Discharge Destination:  home hospice    Author: Marylu Lund, MD 06/19/2022 5:57 PM  For on call review [email or web address].

## 2022-06-20 DIAGNOSIS — R630 Anorexia: Secondary | ICD-10-CM | POA: Diagnosis not present

## 2022-06-20 DIAGNOSIS — R531 Weakness: Secondary | ICD-10-CM | POA: Diagnosis not present

## 2022-06-20 DIAGNOSIS — Z7189 Other specified counseling: Secondary | ICD-10-CM | POA: Diagnosis not present

## 2022-06-20 LAB — GLUCOSE, CAPILLARY
Glucose-Capillary: 107 mg/dL — ABNORMAL HIGH (ref 70–99)
Glucose-Capillary: 150 mg/dL — ABNORMAL HIGH (ref 70–99)

## 2022-06-20 MED ORDER — FENTANYL 50 MCG/HR TD PT72
1.0000 | MEDICATED_PATCH | TRANSDERMAL | Status: DC
  Administered 2022-06-20 – 2022-06-23 (×2): 1 via TRANSDERMAL
  Filled 2022-06-20 (×2): qty 1

## 2022-06-20 MED ORDER — FENTANYL 25 MCG/HR TD PT72: 2.0000 | MEDICATED_PATCH | TRANSDERMAL | Status: DC

## 2022-06-20 NOTE — TOC Progression Note (Addendum)
Transition of Care [facility] - Progression Note    Patient Details  Name: Hector Lopez MRN: [id number] Date of Birth: 1946-05-05  Transition of Care [facility]) CM/SW Contact  Leeroy Cha, RN Phone Number: 06/20/2022, 11:50 AM  Clinical Narrative:     [facility]/ [facility] -referral done.  It may Monday before a bed is available Expected Discharge Plan: Home/Self Care Barriers to Discharge: Continued Medical Work up  Expected Discharge Plan and Services Expected Discharge Plan: Home/Self Care       Living arrangements for the past 2 months: Single Family Home                                       Social Determinants of Health (SDOH) Interventions    Readmission Risk Interventions   Row Labels 06/16/2022   10:51 AM 03/31/2022    8:32 AM  Readmission Risk Prevention Plan   Section Header. No data exists in this row.    Transportation Screening   Complete Complete  PCP or Specialist Appt within 3-5 Days    Complete  HRI or [place]    Complete  Social Work Consult for [place] Planning/Counseling    Complete  Palliative Care Screening    Not Applicable  Medication Review [employer])   Complete Complete  PCP or Specialist appointment within 3-5 days of discharge   Complete   HRI or [place]   Complete   SW Recovery Care/Counseling Consult   Complete   Palliative Care Screening   Not [place]   Not Applicable

## 2022-06-20 NOTE — Progress Notes (Signed)
Daily Progress Note   Patient Name: Hector Lopez       Date: 06/20/2022 DOB: 1946-08-24  Age: 76 y.o. MRN#: [id number] Attending Physician: Donne Hazel, MD Primary Care Physician: [name], MD Admit Date: 06/13/2022  Reason for Consultation/Follow-up: Complex medical decision making and symptom management  Subjective: Abdominal pain much improved today.  Reviewed EMR prior to presenting to bedside.   Chart reviewed, discussed with wife, patient seen Medication history noted Disposition options and broad goals of care discussions again undertaken, see below.  Plan is now for residential hospice.  Minimal oral intake and escalating symptom burden, prognosis likely less than 2 weeks in my opinion     Length of Stay: 7  Current Medications: Scheduled Meds:   acetaminophen  1,000 mg Oral TID   amLODipine  10 mg Oral Q1500   dexamethasone  4 mg Oral Daily   enoxaparin (LOVENOX) injection  40 mg Subcutaneous Q24H   feeding supplement  237 mL Oral TID BM   fentaNYL  1 patch Transdermal Q72H   insulin aspart  0-5 Units Subcutaneous QHS   insulin aspart  0-9 Units Subcutaneous TID WC   insulin glargine-yfgn  10 Units Subcutaneous QHS   lisinopril  40 mg Oral Q1500   pantoprazole  40 mg Oral Daily   polyethylene glycol  17 g Oral BID   senna-docusate  2 tablet Oral BID    PRN Meds: alum & mag hydroxide-simeth, antiseptic oral rinse, HYDROmorphone (DILAUDID) injection, HYDROmorphone HCl, LORazepam **OR** LORazepam **OR** LORazepam, ondansetron **OR** ondansetron (ZOFRAN) IV, polyvinyl alcohol     Physical Exam: General: alert, laying in bed, appears comfortable Eyes: conjunctiva clear, anicteric sclera HENT: normocephalic, atraumatic, moist mucous membranes Cardiovascular:  RRR Respiratory: no increase work of breathing noted, not in respiratory distress Abdomen: Not tender Extremities: no edema in LE b/l Skin: no rashes or lesions on visible skin Neuro: non focal Psych: Anxious at times  Vital Signs: BP (!) 125/99 (BP Location: Left Arm)   Pulse (!) 101   Temp 98.7 F (37.1 C) (Oral)   Resp 16   Ht '5\' 10"'$  (1.778 m)   Wt 63 kg   SpO2 95%   BMI 19.93 kg/m  SpO2: SpO2: 95 % O2 Device: O2 Device: Room Air O2 Flow Rate:    Intake/output summary:  No intake or output data in the 24 hours ending 06/20/22 1255  LBM: Last BM Date : 06/14/22 Baseline Weight: Weight: 63.2 kg Most recent weight: Weight: 63 kg    Patient Active Problem List   Diagnosis Date Noted   Counseling and coordination of care    Palliative care encounter    ACP (advance care planning)    Urothelial cancer ([place])    Metastatic malignant neoplasm ([place])    Intractable pain    Appetite loss    Goals of care, counseling/discussion    Constipation    Biloma    Hypomagnesemia    Generalized weakness 06/13/2022   Failure to thrive in adult 06/13/2022   Loss of weight 06/13/2022   Anorexia 06/13/2022   Hypoalbuminemia due to protein-calorie malnutrition ([place]) 06/13/2022   Hypokalemia 06/13/2022   Dehydration 06/13/2022   Mixed hyperlipidemia 05/15/2022   Symptomatic cholelithiasis 05/13/2022   Abdominal pain 03/29/2022   Type 2 diabetes mellitus with hyperglycemia ([place]) 03/28/2022   AKI (acute kidney injury) ([place]) 03/28/2022   Essential hypertension 03/28/2022   Normocytic anemia 03/28/2022   Right flank pain 03/28/2022   Metastatic renal cell carcinoma ([place]) 03/28/2022   Port-A-Cath in place 07/04/2020   Prostatic hyperplasia 04/13/2020   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 Hector Lopez is a 76 y.o. male  with past medical history of stage IV high-grade urothelial cancer diagnosed in 2021, HTN, T2DM, GERD, and HLD who was  admitted on 06/13/2022 for management of abdominal pain, generalized weakness, and fatigue. Palliative care consulted to assist with complex medical decision making and symptom management.   Recommendations/Plan: # Complex medical decision making/ GOC:       -residential hospice.  -Patient is DNR/DNI  # Symptom Management   - Pain, severe acute on chronic pain in the setting on stage IV urothelial cancer with goal to focus on comfort Medication history noted, patient is on transdermal fentanyl, requiring escalating dosages of IV hydromorphone for comfort.  At times, not able to take oral Dilaudid.  Remains on scheduled Tylenol.      -Anxiety/agitation, in setting of metastatic cancer with goal to focus on comfort  -Continue lorazepam 0.5 mg either sublingual every 4 hours as needed at time of discharge in addition to opioid medications for management.  - Constipation  -Receiving senna to 2 tabs BID. This can be increased up to 8 tabs in 24 hour period if needed.  -Continue Miralax -Continue bowel regimen on discharge.  - Appetite -minimal PO intake.   Prognosis: less than 2 weeks, residential hospice.     Discharge Planning:   [facility] [place] was discussed with patient, wife, bedside RN, hospitalist, social worker,    Thank you for allowing the Palliative Medicine Team to assist in the care of this patient.    Mod MDM  Loistine Chance MD Palliative Care Provider [phone]    Includes review of EMR, discussing care with other staff members involved in patient's medical care, obtaining relevant history and information from patient and/or patient's family, and coordination of care. Greater than 50% of the time was spent counseling and coordinating care related to the above assessment and plan.    Please contact Palliative Medicine Team phone at [phone] for questions and concerns.

## 2022-06-20 NOTE — Progress Notes (Signed)
Progress Note   Patient: Hector Lopez YQI:[id number] DOB: 09/05/1946 DOA: 06/13/2022     7 DOS: the patient was seen and examined on 06/20/2022   Brief hospital course: 76 year old gentleman history of laparoscopic cholecystectomy, stage IV urothelial cancer, GERD, diabetes mellitus type 2 presented to the ED with abdominal pain, failure to thrive.  CT showing possible fluid collection in the gallbladder fossa.  CT also consistent with enlarging necrotic soft tissue mass anterior to the abdominal aorta concerning for underlying neoplastic process, large amount of impacted stool within the distal sigmoid colon and rectum.  Patient placed on IV pain medication, IV fluids, electrolyte repletion.  ED contacted patient's primary oncologist Dr. Alen Blew who recommended admission for further inpatient management due to p.o. intolerance and anorexia.  General surgery consulted.  Assessment and Plan: #1 generalized weakness/failure to thrive in adult/hypoalbuminemia/moderate protein calorie malnutrition -Likely secondary to stage IV urothelial cancer. -Patient also noted to be constipated has not had a bowel movement in 5 weeks. -Patient noted with 30 pound weight loss over the past 3 months with poor appetite due to food not tasting well. -Patient placed on nutritional supplementation, dietitian to be consulted. -Diet liberalized -Palliative care consulted and following. Initial plan for home hospice. Pt seems declining. Now planning residential hospice   2.  Abdominal pain/abdominal mass -CT abdomen and pelvis with enlarging 5.4 x 4.7 x 8 cm necrotic soft tissue mass anterior to the abdominal aorta concerning for presence of underlying neoplastic process. -Patient also constipated, stated has not had a bowel movement in 5 weeks, CT abdomen and pelvis done with large amount of impacted stool within the distal sigmoid colon and rectum, improved with enemas -Palliative care following for symptom management  and pain medications being adjusted by palliative care. -Pt's wife reports pain seems more difficult to control. Will try increasing fentanyl patch to 3mg   3.  Constipation -Patient states no bowel movement in 5 weeks prior to admission. -CT abdomen and pelvis with large amount of impacted stool within the distal sigmoid colon and rectum improved with SMOG -Continue current bowel regimen with MiraLAX twice daily, Senokot-S twice daily.     4.  Stage IV urothelial cancer -Oncology informed of admission. -Palliative care consulted and following and decision made to likely discharge patient home with hospice following. -Pain currently being managed by palliative care. -Plan for residential hospice   5. ?  Large postop fluid collection versus biloma -Noted on CT abdomen and pelvis. -Patient seen by general surgery who doubt patient has a biloma, HIDA scan ordered and done and negative for biloma.  -Patient started on a diet per general surgery. -General surgery was following but have signed off.   6.  Dehydration -IV fluids saline locked.    7.  Hyperlipidemia -transitioned to comfort measures   8.  Type 2 diabetes mellitus with uncontrolled hyperglycemia -Hemoglobin A1c 10.8 (03/29/2022) -now focus on comfort   9.  Hypokalemia/hypomagnesemia -Repleted.   10.  GERD PPI.    11.  Hypertension -Transitioned to comfort measures      Subjective: Asleep, pt's wife asks pt not to be awakened. Family reports pain has been harder to control recently. Asks for increase in analgesia  Physical Exam: Vitals:   06/19/22 0704 06/19/22 1347 06/19/22 2015 06/20/22 1420  BP: (!) 145/83 138/81 (!) 125/99 133/89  Pulse: 91 95 (!) 101 98  Resp: '18 16 16 14  '$ Temp: 98.4 F (36.9 C) 98.5 F (36.9 C) 98.7 F (37.1 C) 97.7  F (36.5 C)  TempSrc: Oral Oral Oral Oral  SpO2: 99% 96% 95% 98%  Weight:      Height:       General exam: Asleep, laying in bed, in nad Respiratory system: Normal  respiratory effort, no audible wheezing Cardiovascular system: regular rate, s1, s2 Gastrointestinal system: Soft, nondistended Central nervous system: CN2-12 grossly intact, strength intact Extremities: Perfused, no clubbing Skin: Normal skin turgor, no notable skin lesions seen Psychiatry: difficult to assess given mentation  Data Reviewed:  There are no new results to review at this time.  Family Communication: Pt in room, family at bedside  Disposition: Status is: Inpatient Remains inpatient appropriate because: Severity of illness  Planned Discharge Destination:  Residential hospice    Author: Marylu Lund, MD 06/20/2022 5:09 PM  For on call review [email or web address].

## 2022-06-20 NOTE — Care Management Important Message (Signed)
Important Message  Patient Details IM Letter given to the Patient. Name: Dory Verdun MRN: [id number] Date of Birth: 11/02/1945   Medicare Important Message Given:  Yes     Kerin Salen 06/20/2022, 11:52 AM

## 2022-06-21 DIAGNOSIS — R531 Weakness: Secondary | ICD-10-CM | POA: Diagnosis not present

## 2022-06-21 DIAGNOSIS — Z7189 Other specified counseling: Secondary | ICD-10-CM | POA: Diagnosis not present

## 2022-06-21 DIAGNOSIS — R630 Anorexia: Secondary | ICD-10-CM | POA: Diagnosis not present

## 2022-06-21 NOTE — Progress Notes (Signed)
Daily Progress Note   Patient Name: Hector Lopez       Date: 06/21/2022 DOB: 02/06/1946  Age: 76 y.o. MRN#: [id number] Attending Physician: Donne Hazel, MD Primary Care Physician: [name], MD Admit Date: 06/13/2022  Reason for Consultation/Follow-up: Complex medical decision making and symptom management  Subjective: complains of uncontrolled pain off and on, family at bedside. Sister and brother in law are visiting.   Reviewed EMR prior to presenting to bedside.   Chart reviewed, discussed with sister Medication history noted Disposition options and broad goals of care discussions and hospice approach again discussed with sister Awaiting residential hospice.  Minimal oral intake and escalating symptom burden, prognosis likely less than 2 weeks in my opinion     Length of Stay: 8  Current Medications: Scheduled Meds:   acetaminophen  1,000 mg Oral TID   amLODipine  10 mg Oral Q1500   dexamethasone  4 mg Oral Daily   feeding supplement  237 mL Oral TID BM   fentaNYL  1 patch Transdermal Q72H   insulin glargine-yfgn  10 Units Subcutaneous QHS   lisinopril  40 mg Oral Q1500   pantoprazole  40 mg Oral Daily   polyethylene glycol  17 g Oral BID   senna-docusate  2 tablet Oral BID    PRN Meds: alum & mag hydroxide-simeth, antiseptic oral rinse, HYDROmorphone (DILAUDID) injection, HYDROmorphone HCl, LORazepam **OR** LORazepam **OR** LORazepam, ondansetron **OR** ondansetron (ZOFRAN) IV, polyvinyl alcohol     Physical Exam: General: alert, laying in bed, appears comfortable Eyes: conjunctiva clear, anicteric sclera HENT: normocephalic, atraumatic, moist mucous membranes Cardiovascular: RRR Respiratory: no increase work of breathing noted, not in respiratory  distress Abdomen: Not tender Extremities: no edema in LE b/l Skin: no rashes or lesions on visible skin Neuro: non focal Psych: Anxious at times  Vital Signs: BP 129/70 (BP Location: Right Arm)   Pulse 92   Temp 98.1 F (36.7 C) (Oral)   Resp 16   Ht '5\' 10"'$  (1.778 m)   Wt 63 kg   SpO2 97%   BMI 19.93 kg/m  SpO2: SpO2: 97 % O2 Device: O2 Device: Room Air O2 Flow Rate:    Intake/output summary:  Intake/Output Summary (Last 24 hours) at 06/21/2022 1327 Last data filed at 06/21/2022 0345 Gross per 24 hour  Intake --  Output 700 ml  Net -700 ml    LBM: Last BM Date : 06/14/22 Baseline Weight: Weight: 63.2 kg Most recent weight: Weight: 63 kg    Patient Active Problem List   Diagnosis Date Noted   Counseling and coordination of care    Palliative care encounter    ACP (advance care planning)    Urothelial cancer ([place])    Metastatic malignant neoplasm ([place])    Intractable pain    Appetite loss    Goals of care, counseling/discussion    Constipation    Biloma    Hypomagnesemia    Generalized weakness 06/13/2022   Failure to thrive in adult 06/13/2022   Loss of weight 06/13/2022   Anorexia 06/13/2022   Hypoalbuminemia due to protein-calorie malnutrition ([place]) 06/13/2022   Hypokalemia 06/13/2022   Dehydration 06/13/2022   Mixed hyperlipidemia 05/15/2022   Symptomatic cholelithiasis 05/13/2022   Abdominal pain 03/29/2022   Type 2 diabetes mellitus with hyperglycemia ([place]) 03/28/2022   AKI (acute kidney injury) ([place]) 03/28/2022   Essential hypertension 03/28/2022   Normocytic anemia 03/28/2022   Right flank pain 03/28/2022   Metastatic renal cell carcinoma ([place]) 03/28/2022   Port-A-Cath in place 07/04/2020   Prostatic hyperplasia 04/13/2020   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 male  with past medical history of stage IV high-grade urothelial cancer diagnosed in 2021, HTN, T2DM, GERD,  and HLD who was admitted on 06/13/2022 for management of abdominal pain, generalized weakness, and fatigue. Palliative care consulted to assist with complex medical decision making and symptom management.   Recommendations/Plan: # Complex medical decision making/ GOC:       -residential hospice.  -Patient is DNR/DNI.   # Symptom Management   - Pain, severe acute on chronic pain in the setting on stage IV urothelial cancer with goal to focus on comfort Medication history noted, patient is on transdermal fentanyl, requiring escalating dosages of IV hydromorphone for comfort.  At times, not able to take oral Dilaudid.  Remains on scheduled Tylenol.  Has required 6 mg IV Dilaudid in the last 24 hours.     -Anxiety/agitation, in setting of metastatic cancer with goal to focus on comfort  -Continue lorazepam 0.5 mg either sublingual every 4 hours as needed at time of discharge in addition to opioid medications for management.  - Constipation  -Receiving senna to 2 tabs BID. This can be increased up to 8 tabs in 24 hour period if needed.  -Continue Miralax -Continue bowel regimen on discharge.  - Appetite -minimal PO intake.   Prognosis: less than 2 weeks, residential hospice.     Discharge Planning:   [facility] [place] was discussed with patient, sister and brother in law at bedside.   Thank you for allowing the Palliative Medicine Team to assist in the care of this patient.    Mod MDM  Loistine Chance MD Palliative Care Provider [phone]    Includes review of EMR, discussing care with other staff members involved in patient's medical care, obtaining relevant history and information from patient and/or patient's family, and coordination of care. Greater than 50% of the time was spent counseling and coordinating care related to the above assessment and plan.    Please contact Palliative Medicine Team phone at [phone] for questions and concerns.

## 2022-06-21 NOTE — Progress Notes (Signed)
Progress Note   Patient: Hector Lopez MVE:[id number] DOB: [birth year] DOA: 06/13/2022     8 DOS: the patient was seen and examined on 06/21/2022   Brief hospital course: 76 year old gentleman history of laparoscopic cholecystectomy, stage IV urothelial cancer, GERD, diabetes mellitus type 2 presented to the ED with abdominal pain, failure to thrive.  CT showing possible fluid collection in the gallbladder fossa.  CT also consistent with enlarging necrotic soft tissue mass anterior to the abdominal aorta concerning for underlying neoplastic process, large amount of impacted stool within the distal sigmoid colon and rectum.  Patient placed on IV pain medication, IV fluids, electrolyte repletion.  ED contacted patient's primary oncologist Dr. Alen Blew who recommended admission for further inpatient management due to p.o. intolerance and anorexia.  General surgery consulted.  Assessment and Plan: #1 generalized weakness/failure to thrive in adult/hypoalbuminemia/moderate protein calorie malnutrition -Likely secondary to stage IV urothelial cancer. -Patient also noted to be constipated has not had a bowel movement in 5 weeks. -Patient noted with 30 pound weight loss over the past 3 months with poor appetite due to food not tasting well. -Patient placed on nutritional supplementation, dietitian to be consulted. -Diet liberalized -Palliative care consulted and following. Initial plan for home hospice. Pt seems declining. Planning for residential hospice   2.  Abdominal pain/abdominal mass -CT abdomen and pelvis with enlarging 5.4 x 4.7 x 8 cm necrotic soft tissue mass anterior to the abdominal aorta concerning for presence of underlying neoplastic process. -Patient also constipated, stated has not had a bowel movement in 5 weeks, CT abdomen and pelvis done with large amount of impacted stool within the distal sigmoid colon and rectum, improved with enemas -Palliative care following for symptom management  and pain medications being adjusted by palliative care. -Pt's wife recently reported difficulty with pain control. Increased fentanyl patch to 4mg on 10/6. Seems more comfortable today   3.  Constipation -Patient states no bowel movement in 5 weeks prior to admission. -CT abdomen and pelvis with large amount of impacted stool within the distal sigmoid colon and rectum improved with SMOG -Continue current bowel regimen with MiraLAX twice daily, Senokot-S twice daily.     4.  Stage IV urothelial cancer -Oncology informed of admission. -Palliative care consulted and following and decision made to likely discharge patient home with hospice following. -Pain currently being managed by palliative care. -Plan for residential hospice   5. ?  Large postop fluid collection versus biloma -Noted on CT abdomen and pelvis. -Patient seen by general surgery who doubt patient has a biloma, HIDA scan ordered and done and negative for biloma.  -Patient started on a diet per general surgery. -General surgery was following but have signed off.   6.  Dehydration -IV fluids saline locked.    7.  Hyperlipidemia -transitioned to comfort measures   8.  Type 2 diabetes mellitus with uncontrolled hyperglycemia -Hemoglobin A1c 10.8 (03/29/2022) -now focus on comfort   9.  Hypokalemia/hypomagnesemia -Repleted.   10.  GERD PPI.    11.  Hypertension -Transitioned to comfort measures      Subjective:Pt asleep, arousable. Without complaints  Physical Exam: Vitals:   06/19/22 1347 06/19/22 2015 06/20/22 1420 06/21/22 0513  BP: 138/81 (!) 125/99 133/89 129/70  Pulse: 95 (!) 101 98 92  Resp: '16 16 14 16  '$ Temp: 98.5 F (36.9 C) 98.7 F (37.1 C) 97.7 F (36.5 C) 98.1 F (36.7 C)  TempSrc: Oral Oral Oral Oral  SpO2: 96% 95% 98% 97%  Weight:  Height:       General exam: Awake, laying in bed, in nad Respiratory system: Normal respiratory effort, no wheezing Cardiovascular system: regular rate,  s1, s2 Gastrointestinal system: Soft, nondistended, positive BS Central nervous system: CN2-12 grossly intact, strength intact Extremities: Perfused, no clubbing Skin: Normal skin turgor, no notable skin lesions seen Psychiatry: Mood normal // no visual hallucinations   Data Reviewed:  There are no new results to review at this time.  Family Communication: Pt in room, family at bedside  Disposition: Status is: Inpatient Remains inpatient appropriate because: Severity of illness  Planned Discharge Destination:  Residential hospice    Author: Marylu Lund, MD 06/21/2022 4:21 PM  For on call review [email or web address].

## 2022-06-22 DIAGNOSIS — R531 Weakness: Secondary | ICD-10-CM | POA: Diagnosis not present

## 2022-06-22 DIAGNOSIS — R630 Anorexia: Secondary | ICD-10-CM | POA: Diagnosis not present

## 2022-06-22 DIAGNOSIS — Z7189 Other specified counseling: Secondary | ICD-10-CM | POA: Diagnosis not present

## 2022-06-22 MED ORDER — PHENOL 1.4 % MT LIQD
1.0000 | OROMUCOSAL | Status: DC | PRN
  Filled 2022-06-22: qty 177

## 2022-06-22 MED ORDER — MAGIC MOUTHWASH
5.0000 mL | Freq: Four times a day (QID) | ORAL | Status: DC | PRN
  Administered 2022-06-23: 5 mL via ORAL
  Filled 2022-06-22 (×2): qty 5

## 2022-06-22 NOTE — TOC Progression Note (Signed)
Transition of Care [facility]) - Progression Note    Patient Details  Name: Hector Lopez MRN: [id number] Date of Birth: February 14, 1946  Transition of [facility]) CM/SW Contact  Ross Ludwig, [place] Phone Number: 06/22/2022, 1:28 PM  Clinical Narrative:     CSW spoke to [facility], hospice facility through [facility] [place].  They do not have a bed available today, TOC to follow up tomorrow.   Expected Discharge Plan: Home/Self Care Barriers to Discharge: Continued Medical Work up  Expected Discharge Plan and Services Expected Discharge Plan: Home/Self Care       Living arrangements for the past 2 months: Single Family Home                                       Social Determinants of Health (SDOH) Interventions    Readmission Risk Interventions    06/16/2022   10:51 AM 03/31/2022    8:32 AM  Readmission Risk Prevention Plan  Transportation Screening Complete Complete  PCP or Specialist Appt within 3-5 Days  Complete  HRI or [place]  Complete  Social Work Consult for [place] Planning/Counseling  Complete  Palliative Care Screening  Not Applicable  Medication Review Press photographer) Complete Complete  PCP or Specialist appointment within 3-5 days of discharge Complete   HRI or [place] Complete   SW Recovery Care/Counseling Consult Complete   [place] Not Applicable

## 2022-06-22 NOTE — Progress Notes (Signed)
Progress Note   Patient: Hector Lopez ONG:[id number] DOB: 11-18-1945 DOA: 06/13/2022     9 DOS: the patient was seen and examined on 06/22/2022   Brief hospital course: 76 year old gentleman history of laparoscopic cholecystectomy, stage IV urothelial cancer, GERD, diabetes mellitus type 2 presented to the ED with abdominal pain, failure to thrive.  CT showing possible fluid collection in the gallbladder fossa.  CT also consistent with enlarging necrotic soft tissue mass anterior to the abdominal aorta concerning for underlying neoplastic process, large amount of impacted stool within the distal sigmoid colon and rectum.  Patient placed on IV pain medication, IV fluids, electrolyte repletion.  ED contacted patient's primary oncologist Dr. Alen Blew who recommended admission for further inpatient management due to p.o. intolerance and anorexia.  General surgery consulted.  Assessment and Plan: #1 generalized weakness/failure to thrive in adult/hypoalbuminemia/moderate protein calorie malnutrition -Likely secondary to stage IV urothelial cancer. -Patient noted with 30 pound weight loss over the past 3 months with poor appetite due to food not tasting well. -Patient placed on nutritional supplementation, dietitian to be consulted. -Diet liberalized -Palliative care consulted and following. Initial plan for home hospice. Pt seems declining. Planning for residential hospice, awaiting bed   2.  Abdominal pain/abdominal mass -CT abdomen and pelvis with enlarging 5.4 x 4.7 x 8 cm necrotic soft tissue mass anterior to the abdominal aorta concerning for presence of underlying neoplastic process. -Patient also constipated, stated has not had a bowel movement in 5 weeks, CT abdomen and pelvis done with large amount of impacted stool within the distal sigmoid colon and rectum, improved with enemas -Palliative care following for symptom management and pain medications being adjusted by palliative care. -Pt's wife  recently reported difficulty with pain control. Increased fentanyl patch to 67mg on 10/6. Seems more comfortable today, however is complaining of sore throat. Will give trial of chloraseptic spray and magic mouthwash   3.  Constipation -Patient states no bowel movement in 5 weeks prior to admission. -CT abdomen and pelvis with large amount of impacted stool within the distal sigmoid colon and rectum improved with SMOG -Continue current bowel regimen with MiraLAX twice daily, Senokot-S twice daily.     4.  Stage IV urothelial cancer -Oncology informed of admission. -Palliative care consulted and following and decision made to likely discharge patient home with hospice following. -Pain currently being managed by palliative care. -Plan for residential hospice   5. ?  Large postop fluid collection versus biloma -Noted on CT abdomen and pelvis. -Patient seen by general surgery who doubt patient has a biloma, HIDA scan ordered and done and negative for biloma.  -Patient started on a diet per general surgery. -General surgery was following but have signed off.   6.  Dehydration -IV fluids saline locked.    7.  Hyperlipidemia -transitioned to comfort measures   8.  Type 2 diabetes mellitus with uncontrolled hyperglycemia -Hemoglobin A1c 10.8 (03/29/2022) -now focus on comfort   9.  Hypokalemia/hypomagnesemia -Repleted.   10.  GERD PPI.    11.  Hypertension -Transitioned to comfort measures      Subjective:Reports pain is better controlled, however is having sore throat  Physical Exam: Vitals:   06/20/22 1420 06/21/22 0513 06/22/22 0513 06/22/22 1430  BP: 133/89 129/70 (!) 155/91 135/86  Pulse: 98 92 94 95  Resp: '14 16 18 18  '$ Temp: 97.7 F (36.5 C) 98.1 F (36.7 C) 98.3 F (36.8 C) 98.8 F (37.1 C)  TempSrc: Oral Oral Oral Oral  SpO2:  98% 97% 100% 98%  Weight:      Height:       General exam: Conversant, in no acute distress Respiratory system: normal chest rise,  clear, no audible wheezing Cardiovascular system: regular rhythm, s1-s2 Gastrointestinal system: Nondistended, nontender, pos BS Central nervous system: No seizures, no tremors Extremities: No cyanosis, no joint deformities Skin: No rashes, no pallor Psychiatry: Affect normal // no auditory hallucinations   Data Reviewed:  There are no new results to review at this time.  Family Communication: Pt in room, family not at bedside  Disposition: Status is: Inpatient Remains inpatient appropriate because: Severity of illness  Planned Discharge Destination:  Residential hospice    Author: Marylu Lund, MD 06/22/2022 3:22 PM  For on call review [email or web address].

## 2022-06-23 DIAGNOSIS — R531 Weakness: Secondary | ICD-10-CM | POA: Diagnosis not present

## 2022-06-23 DIAGNOSIS — R630 Anorexia: Secondary | ICD-10-CM | POA: Diagnosis not present

## 2022-06-23 DIAGNOSIS — Z7189 Other specified counseling: Secondary | ICD-10-CM | POA: Diagnosis not present

## 2022-06-23 MED ORDER — HYDROMORPHONE HCL 1 MG/ML IJ SOLN
1.0000 mg | INTRAMUSCULAR | Status: DC | PRN
  Administered 2022-06-23: 1 mg via INTRAVENOUS
  Administered 2022-06-23 – 2022-06-24 (×2): 1.5 mg via INTRAVENOUS
  Administered 2022-06-24: 1 mg via INTRAVENOUS
  Administered 2022-06-24: 1.5 mg via INTRAVENOUS
  Filled 2022-06-23 (×2): qty 1.5
  Filled 2022-06-23 (×2): qty 1
  Filled 2022-06-23: qty 1.5

## 2022-06-23 MED ORDER — BISACODYL 10 MG RE SUPP: 10.0000 mg | Freq: Every day | RECTAL | Status: DC | PRN

## 2022-06-23 MED ORDER — LIP MEDEX EX OINT
1.0000 | TOPICAL_OINTMENT | CUTANEOUS | Status: DC | PRN
  Filled 2022-06-23: qty 7

## 2022-06-23 MED ORDER — MAGIC MOUTHWASH
5.0000 mL | Freq: Three times a day (TID) | ORAL | Status: DC
  Administered 2022-06-23 – 2022-06-25 (×4): 5 mL via ORAL
  Filled 2022-06-23 (×3): qty 5

## 2022-06-23 NOTE — Progress Notes (Signed)
Daily Progress Note   Patient Name: Hector Lopez       Date: 06/23/2022 DOB: 03-31-1946  Age: 76 y.o. MRN#: [id number] Attending Physician: Donne Hazel, MD Primary Care Physician: [name], MD Admit Date: 06/13/2022  Reason for Consultation/Follow-up: Complex medical decision making and symptom management  Subjective: complains of uncontrolled pain, asks for pain medication, discussed with bedside RN    Chart reviewed,   Medication history noted   Awaiting residential hospice.  Minimal to nil oral intake and escalating symptom burden, prognosis likely less than 2 weeks in my opinion     Length of Stay: 10  Current Medications: Scheduled Meds:   dexamethasone  4 mg Oral Daily   feeding supplement  237 mL Oral TID BM   fentaNYL  1 patch Transdermal Q72H   insulin glargine-yfgn  10 Units Subcutaneous QHS   magic mouthwash  5 mL Oral TID   polyethylene glycol  17 g Oral BID   senna-docusate  2 tablet Oral BID    PRN Meds: alum & mag hydroxide-simeth, antiseptic oral rinse, bisacodyl, HYDROmorphone (DILAUDID) injection, HYDROmorphone HCl, lip balm, LORazepam **OR** LORazepam **OR** LORazepam, ondansetron **OR** ondansetron (ZOFRAN) IV, phenol, polyvinyl alcohol     Physical Exam: General: alert, laying in bed, appears comfortable Eyes: conjunctiva clear, anicteric sclera HENT: normocephalic, atraumatic, moist mucous membranes Cardiovascular: RRR Respiratory: no increase work of breathing noted, not in respiratory distress Abdomen: Not tender Extremities: no edema in LE b/l Skin: no rashes or lesions on visible skin Neuro: non focal Psych: Anxious at times  Vital Signs: BP (!) 158/93 (BP Location: Right Arm)   Pulse (!) 101   Temp 98.2 F (36.8 C) (Oral)   Resp  18   Ht '5\' 10"'$  (1.778 m)   Wt 63 kg   SpO2 98%   BMI 19.93 kg/m  SpO2: SpO2: 98 % O2 Device: O2 Device: Room Air O2 Flow Rate:    Intake/output summary: No intake or output data in the 24 hours ending 06/23/22 1218   LBM: Last BM Date : 06/15/22 Baseline Weight: Weight: 63.2 kg Most recent weight: Weight: 63 kg    Patient Active Problem List   Diagnosis Date Noted   Counseling and coordination of care    Palliative care encounter    ACP (advance care planning)  Urothelial cancer ([place])    Metastatic malignant neoplasm ([place])    Intractable pain    Appetite loss    Goals of care, counseling/discussion    Constipation    Biloma    Hypomagnesemia    Generalized weakness 06/13/2022   Failure to thrive in adult 06/13/2022   Loss of weight 06/13/2022   Anorexia 06/13/2022   Hypoalbuminemia due to protein-calorie malnutrition ([place]) 06/13/2022   Hypokalemia 06/13/2022   Dehydration 06/13/2022   Mixed hyperlipidemia 05/15/2022   Symptomatic cholelithiasis 05/13/2022   Abdominal pain 03/29/2022   Type 2 diabetes mellitus with hyperglycemia ([place]) 03/28/2022   AKI (acute kidney injury) ([place]) 03/28/2022   Essential hypertension 03/28/2022   Normocytic anemia 03/28/2022   Right flank pain 03/28/2022   Metastatic renal cell carcinoma ([place]) 03/28/2022   Port-A-Cath in place 07/04/2020   Prostatic hyperplasia 04/13/2020   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 male  with past medical history of stage IV high-grade urothelial cancer diagnosed in 2021, HTN, T2DM, GERD, and HLD who was admitted on 06/13/2022 for management of abdominal pain, generalized weakness, and fatigue. Palliative care consulted to assist with complex medical decision making and symptom management.   Recommendations/Plan: # Complex medical decision making/ GOC:       -residential hospice.  -Patient is DNR/DNI.   # Symptom Management    - Pain, severe acute on chronic pain in the setting on stage IV urothelial cancer with goal to focus on comfort Medication history noted, patient is on transdermal fentanyl, requiring escalating dosages of IV hydromorphone for comfort.     not able to take oral Dilaudid.   -Anxiety/agitation, in setting of metastatic cancer with goal to focus on comfort  -Continue lorazepam 0.5 mg either sublingual every 4 hours as needed at time of discharge in addition to opioid medications for management.  - Constipation  -Receiving senna to 2 tabs BID. This can be increased up to 8 tabs in 24 hour period if needed.  -Continue Miralax, add Dulcolax rectal suppository today and monitor.  -Continue bowel regimen on discharge.  - Appetite -minimal PO intake.  D/C PO medications no longer important for his comfort.   Prognosis: less than 2 weeks, residential hospice.      Discharge Planning:   [facility] of [place] was discussed with IDT  Thank you for allowing the Palliative Medicine Team to assist in the care of this patient.    Mod MDM  Loistine Chance MD Palliative Care Provider [phone]    Includes review of EMR, discussing care with other staff members involved in patient's medical care, obtaining relevant history and information from patient and/or patient's family, and coordination of care. Greater than 50% of the time was spent counseling and coordinating care related to the above assessment and plan.    Please contact Palliative Medicine Team phone at [phone] for questions and concerns.

## 2022-06-23 NOTE — Progress Notes (Signed)
Chaplain spoke with Hector Lopez's wife, Hector Lopez, who was at bedside.  Hector Lopez was resting and when he woke up he was not really able to converse.  It is difficult for Hector Lopez to see him this way and she asked for prayers that God will take him soon.  Chaplain provided listening as she shared and grief support.  [address] [place] Pager, [phone]

## 2022-06-23 NOTE — Progress Notes (Signed)
Nutrition Brief Note  Chart reviewed. Pt now transitioning to comfort care.  No further nutrition interventions planned at this time.   Evany Schecter, MS, RD, LDN Inpatient Clinical Dietitian Contact information available via Amion   

## 2022-06-23 NOTE — Progress Notes (Signed)
Progress Note   Patient: Hector Lopez XNA:[id number] DOB: 17-Jul-1946 DOA: 06/13/2022     10 DOS: the patient was seen and examined on 06/23/2022   Brief hospital course: 76 year old gentleman history of laparoscopic cholecystectomy, stage IV urothelial cancer, GERD, diabetes mellitus type 2 presented to the ED with abdominal pain, failure to thrive.  CT showing possible fluid collection in the gallbladder fossa.  CT also consistent with enlarging necrotic soft tissue mass anterior to the abdominal aorta concerning for underlying neoplastic process, large amount of impacted stool within the distal sigmoid colon and rectum.  Patient placed on IV pain medication, IV fluids, electrolyte repletion.  ED contacted patient's primary oncologist Dr. Alen Blew who recommended admission for further inpatient management due to p.o. intolerance and anorexia.  General surgery consulted.  Assessment and Plan: #1 generalized weakness/failure to thrive in adult/hypoalbuminemia/moderate protein calorie malnutrition -Likely secondary to stage IV urothelial cancer. -Patient noted with 30 pound weight loss over the past 3 months with poor appetite due to food not tasting well. -Patient placed on nutritional supplementation, dietitian to be consulted. -Diet liberalized -Palliative care consulted and following. Initial plan for home hospice. Pt seems declining. Planning for residential hospice, awaiting hospice bed   2.  Abdominal pain/abdominal mass -CT abdomen and pelvis with enlarging 5.4 x 4.7 x 8 cm necrotic soft tissue mass anterior to the abdominal aorta concerning for presence of underlying neoplastic process. -Patient also constipated, stated has not had a bowel movement in 5 weeks, CT abdomen and pelvis done with large amount of impacted stool within the distal sigmoid colon and rectum, improved with enemas -Palliative care following for symptom management and pain medications being adjusted by palliative  care. -Pt's wife recently reported difficulty with pain control. Increased fentanyl patch to 16mg on 10/6. Seems more comfortable today, however is complaining of sore throat. Giving trial of chloraseptic spray and magic mouthwash   3.  Constipation -Patient states no bowel movement in 5 weeks prior to admission. -CT abdomen and pelvis with large amount of impacted stool within the distal sigmoid colon and rectum improved with SMOG -Continue current bowel regimen with MiraLAX twice daily, Senokot-S twice daily.     4.  Stage IV urothelial cancer -Oncology informed of admission. -Palliative care consulted and following and decision made to likely discharge patient home with hospice following. -Pain currently being managed by palliative care. -Plan for residential hospice   5. ?  Large postop fluid collection versus biloma -Noted on CT abdomen and pelvis. -Patient seen by general surgery who doubt patient has a biloma, HIDA scan ordered and done and negative for biloma.  -Patient started on a diet per general surgery. -General surgery was following but have signed off.   6.  Dehydration -IV fluids saline locked.    7.  Hyperlipidemia -transitioned to comfort measures   8.  Type 2 diabetes mellitus with uncontrolled hyperglycemia -Hemoglobin A1c 10.8 (03/29/2022) -now focus on comfort   9.  Hypokalemia/hypomagnesemia -Repleted.   10.  GERD PPI.    11.  Hypertension -Transitioned to comfort measures      Subjective:Still having sore throat  Physical Exam: Vitals:   06/22/22 0513 06/22/22 1430 06/23/22 0639 06/23/22 1331  BP: (!) 155/91 135/86 (!) 158/93 130/78  Pulse: 94 95 (!) 101 (!) 101  Resp: '18 18 18 18  '$ Temp: 98.3 F (36.8 C) 98.8 F (37.1 C) 98.2 F (36.8 C) 99 F (37.2 C)  TempSrc: Oral Oral Oral Oral  SpO2: 100% 98% 98%  96%  Weight:      Height:       General exam: Conversant, in no acute distress Respiratory system: normal chest rise, clear, no  audible wheezing Cardiovascular system: regular rhythm, s1-s2 Gastrointestinal system: Nondistended, nontender, pos BS Central nervous system: No seizures, no tremors Extremities: No cyanosis, no joint deformities Skin: No rashes, no pallor Psychiatry: Affect normal // no auditory hallucinations   Data Reviewed:  There are no new results to review at this time.  Family Communication: Pt in room, family not at bedside  Disposition: Status is: Inpatient Remains inpatient appropriate because: Severity of illness  Planned Discharge Destination:  Residential hospice    Author: Marylu Lund, MD 06/23/2022 4:51 PM  For on call review [email or web address].

## 2022-06-24 ENCOUNTER — Ambulatory Visit: Payer: Medicare Other | Admitting: Radiation Oncology

## 2022-06-24 ENCOUNTER — Ambulatory Visit: Payer: Medicare Other

## 2022-06-24 DIAGNOSIS — R531 Weakness: Secondary | ICD-10-CM | POA: Diagnosis not present

## 2022-06-24 DIAGNOSIS — Z7189 Other specified counseling: Secondary | ICD-10-CM | POA: Diagnosis not present

## 2022-06-24 DIAGNOSIS — R630 Anorexia: Secondary | ICD-10-CM | POA: Diagnosis not present

## 2022-06-24 DIAGNOSIS — Z79899 Other long term (current) drug therapy: Secondary | ICD-10-CM

## 2022-06-24 DIAGNOSIS — R4589 Other symptoms and signs involving emotional state: Secondary | ICD-10-CM

## 2022-06-24 MED ORDER — BISACODYL 10 MG RE SUPP: 10.0000 mg | Freq: Every day | RECTAL | Status: DC | PRN

## 2022-06-24 MED ORDER — SODIUM CHLORIDE 0.9 % IV SOLN
0.5000 mg/h | INTRAVENOUS | Status: DC
  Administered 2022-06-24: 0.5 mg/h via INTRAVENOUS
  Filled 2022-06-24: qty 5

## 2022-06-24 MED ORDER — LORAZEPAM 2 MG/ML IJ SOLN
0.5000 mg | Freq: Two times a day (BID) | INTRAMUSCULAR | Status: DC
  Administered 2022-06-24 (×2): 0.5 mg via INTRAVENOUS
  Filled 2022-06-24 (×2): qty 1

## 2022-06-24 MED ORDER — HYDROMORPHONE BOLUS VIA INFUSION: 1.0000 mg | INTRAVENOUS | Status: DC | PRN

## 2022-06-24 NOTE — Plan of Care (Signed)
  Problem: Education: Goal: Knowledge of General Education information will improve Description: Including pain rating scale, medication(s)/side effects and non-pharmacologic comfort measures Outcome: Progressing   Problem: Health Behavior/Discharge Planning: Goal: Ability to manage health-related needs will improve Outcome: Progressing   Problem: Clinical Measurements: Goal: Ability to maintain clinical measurements within normal limits will improve Outcome: Progressing Goal: Will remain free from infection Outcome: Progressing Goal: Diagnostic test results will improve Outcome: Progressing Goal: Respiratory complications will improve Outcome: Progressing Goal: Cardiovascular complication will be avoided Outcome: Progressing   Problem: Activity: Goal: Risk for activity intolerance will decrease Outcome: Progressing   Problem: Nutrition: Goal: Adequate nutrition will be maintained Outcome: Progressing   Problem: Coping: Goal: Level of anxiety will decrease Outcome: Progressing   Problem: Elimination: Goal: Will not experience complications related to bowel motility Outcome: Progressing Goal: Will not experience complications related to urinary retention Outcome: Progressing   Problem: Pain Managment: Goal: General experience of comfort will improve Outcome: Progressing   Problem: Safety: Goal: Ability to remain free from injury will improve Outcome: Progressing   Problem: Skin Integrity: Goal: Risk for impaired skin integrity will decrease Outcome: Progressing   Problem: Education: Goal: Ability to describe self-care measures that may prevent or decrease complications (Diabetes Survival Skills Education) will improve Outcome: Progressing Goal: Individualized Educational Video(s) Outcome: Progressing   Problem: Coping: Goal: Ability to adjust to condition or change in health will improve Outcome: Progressing   Problem: Fluid Volume: Goal: Ability to  maintain a balanced intake and output will improve Outcome: Progressing   Problem: Health Behavior/Discharge Planning: Goal: Ability to identify and utilize available resources and services will improve Outcome: Progressing Goal: Ability to manage health-related needs will improve Outcome: Progressing   Problem: Metabolic: Goal: Ability to maintain appropriate glucose levels will improve Outcome: Progressing   Problem: Nutritional: Goal: Maintenance of adequate nutrition will improve Outcome: Progressing Goal: Progress toward achieving an optimal weight will improve Outcome: Progressing   Problem: Skin Integrity: Goal: Risk for impaired skin integrity will decrease Outcome: Progressing   Problem: Tissue Perfusion: Goal: Adequacy of tissue perfusion will improve Outcome: Progressing   Problem: Education: Goal: Knowledge of the prescribed therapeutic regimen will improve Outcome: Progressing   Problem: Coping: Goal: Ability to identify and develop effective coping behavior will improve Outcome: Progressing   Problem: Clinical Measurements: Goal: Quality of life will improve Outcome: Progressing   Problem: Respiratory: Goal: Verbalizations of increased ease of respirations will increase Outcome: Progressing   Problem: Role Relationship: Goal: Family's ability to cope with current situation will improve Outcome: Progressing Goal: Ability to verbalize concerns, feelings, and thoughts to partner or family member will improve Outcome: Progressing   Problem: Pain Management: Goal: Satisfaction with pain management regimen will improve Outcome: Progressing   Problem: Education: Goal: Knowledge of the prescribed therapeutic regimen will improve Outcome: Progressing   Problem: Coping: Goal: Ability to identify and develop effective coping behavior will improve Outcome: Progressing   Problem: Clinical Measurements: Goal: Quality of life will improve Outcome:  Progressing   Problem: Respiratory: Goal: Verbalizations of increased ease of respirations will increase Outcome: Progressing   Problem: Role Relationship: Goal: Family's ability to cope with current situation will improve Outcome: Progressing Goal: Ability to verbalize concerns, feelings, and thoughts to partner or family member will improve Outcome: Progressing   Problem: Pain Management: Goal: Satisfaction with pain management regimen will improve Outcome: Progressing

## 2022-06-24 NOTE — Progress Notes (Signed)
Chaplain attempted to see Hector Lopez and his wife, Jackelyn Poling, to provide follow up support.  Debbie declined to meet at this time.    [address], [place] Pager, [phone]

## 2022-06-24 NOTE — Progress Notes (Signed)
Progress Note   Patient: Hector Lopez IRC:[id number] DOB: 1945/10/22 DOA: 06/13/2022     11 DOS: the patient was seen and examined on 06/24/2022   Brief hospital course: 76 year old gentleman history of laparoscopic cholecystectomy, stage IV urothelial cancer, GERD, diabetes mellitus type 2 presented to the ED with abdominal pain, failure to thrive.  CT showing possible fluid collection in the gallbladder fossa.  CT also consistent with enlarging necrotic soft tissue mass anterior to the abdominal aorta concerning for underlying neoplastic process, large amount of impacted stool within the distal sigmoid colon and rectum.  Patient placed on IV pain medication, IV fluids, electrolyte repletion.  ED contacted patient's primary oncologist Dr. Alen Blew who recommended admission for further inpatient management due to p.o. intolerance and anorexia.  General surgery consulted.  Assessment and Plan: #1 generalized weakness/failure to thrive in adult/hypoalbuminemia/moderate protein calorie malnutrition -Likely secondary to stage IV urothelial cancer. -Patient noted with 30 pound weight loss over the past 3 months with poor appetite due to food not tasting well. -Patient placed on nutritional supplementation, dietitian to be consulted. -Diet liberalized, comfort feeds -Palliative care consulted and following. Initial plan for home hospice. With patient declining, now planning for residential hospice, awaiting hospice bed   2.  Abdominal pain/abdominal mass -CT abdomen and pelvis with enlarging 5.4 x 4.7 x 8 cm necrotic soft tissue mass anterior to the abdominal aorta concerning for presence of underlying neoplastic process. -Palliative care following for symptom management and pain medications being adjusted by palliative care. -Pt's wife recently reported difficulty with pain control. Recently increased fentanyl patch to 55mg on 10/6. Continues with pain issues. Appreciate input by Palliative care. Recs  to transition to dilaudid PCA with scheduled anxiolytics    3.  Constipation -Patient states no bowel movement in 5 weeks prior to admission. -CT abdomen and pelvis with large amount of impacted stool within the distal sigmoid colon and rectum improved with SMOG -now cont with comfort care   4.  Stage IV urothelial cancer -Oncology informed of admission. -Pain currently being managed by palliative care. -Plan for residential hospice   5. ?  Large postop fluid collection versus biloma -Noted on CT abdomen and pelvis. -Patient seen by general surgery who doubt patient has a biloma, HIDA scan ordered and done and negative for biloma.  -Patient was started on a diet per general surgery. -General surgery was following but have signed off.   6.  Dehydration -IV fluids saline locked.    7.  Hyperlipidemia -transitioned to comfort measures   8.  Type 2 diabetes mellitus with uncontrolled hyperglycemia -Hemoglobin A1c 10.8 (03/29/2022) -now focus on comfort   9.  Hypokalemia/hypomagnesemia -Repleted.   10.  GERD PPI.  Comfort status   11.  Hypertension -Transitioned to comfort measures      Subjective:Difficult to assess, pt not very verbal. Nods "yes" to acknowledging uncontrolled pain  Physical Exam: Vitals:   06/23/22 0639 06/23/22 1331 06/24/22 0458 06/24/22 1329  BP: (!) 158/93 130/78 (!) 143/82 132/79  Pulse: (!) 101 (!) 101 97 94  Resp: '18 18 16 14  '$ Temp: 98.2 F (36.8 C) 99 F (37.2 C) 97.7 F (36.5 C) 99 F (37.2 C)  TempSrc: Oral Oral Oral Oral  SpO2: 98% 96% 98% 98%  Weight:      Height:       General exam: Awake, laying in bed, in nad Respiratory system: Normal respiratory effort, no wheezing Cardiovascular system: regular rate, s1, s2 Gastrointestinal system: Soft, nondistended, positive  BS Central nervous system: CN2-12 grossly intact, strength intact Extremities: Perfused, no clubbing Skin: Normal skin turgor, no notable skin lesions  seen Psychiatry: Mood normal // no visual hallucinations   Data Reviewed:  There are no new results to review at this time.  Family Communication: Pt in room, family at bedside  Disposition: Status is: Inpatient Remains inpatient appropriate because: Severity of illness  Planned Discharge Destination:  Residential hospice    Author: Marylu Lund, MD 06/24/2022 4:07 PM  For on call review [email or web address].

## 2022-06-24 NOTE — Progress Notes (Signed)
Daily Progress Note   Patient Name: Hector Lopez       Date: 06/24/2022 DOB: 08/08/1946  Age: 76 y.o. MRN#: [id number] Attending Physician: Donne Hazel, MD Primary Care Physician: [name], MD Admit Date: 06/13/2022  Reason for Consultation/Follow-up: Complex medical decision making and symptom management  Subjective:   Extensive review of EMR since this provider last saw patient.  Discussed care with bedside RN who noted wife present at bedside and would like to speak to physician.  Presented to bedside.  Patient laying in bed comfortably at this time.  Wife, Hector Lopez, present at bedside.  I introduced myself as a member of the palliative care team which they remember meeting me last week.  She had just received pain medications and so was sleepy though easily awakened and denied pain at this time since just received medication.  They be able to provide details due to patient's medical state.  Hector Lopez described how patient yesterday had written that his pain was not controlled and he needed IV Dilaudid now.  She also noted that though patient has been confused at times, he had this sense to call her this morning from the room phone stating that he was in excruciating pain and could not get medications.  Empathized with difficult situation and apologized that occurred.  She noted that patient's focus has been not be in pain and to have improved anxiety due to his end-of-life fears.  He noted the patient did not want to discuss in further prognosis as he knows it is limited and he is even stated he does not want family to see him like this.  To be noted how difficult it is to see the patient suffering.  Again empathized with situation and that we could work to improve his pain.  At time of  EMR review, patient has been receiving 50 mcg fentanyl patch every 72 hours scheduled.  Patient has received IV Dilaudid 1.5 mg x 3 doses and 1 mg x 2 doses.  As patient clearly responding well to IV Dilaudid, discussed transition to PCA to allow more continuous control in response to IV Dilaudid and allow for more frequent nursing boluses.  Wife agreed with this plan. With patient's worsening agitation at the end of life, also discussed scheduling Ativan while having as needed Ativan available  for breakthrough anxiety and agitation.  Wife agreed with this as well.  Spent time allowing for emotional expression and support.  Still awaiting residential hospice placement.  Discussed care with bedside RN, case manager, and primary hospitalist.  Length of Stay: 11  Current Medications: Scheduled Meds:   feeding supplement  237 mL Oral TID BM   fentaNYL  1 patch Transdermal Q72H   magic mouthwash  5 mL Oral TID   polyethylene glycol  17 g Oral BID   senna-docusate  2 tablet Oral BID    PRN Meds: alum & mag hydroxide-simeth, antiseptic oral rinse, bisacodyl, HYDROmorphone (DILAUDID) injection, HYDROmorphone HCl, lip balm, LORazepam **OR** LORazepam **OR** LORazepam, ondansetron **OR** ondansetron (ZOFRAN) IV, phenol, polyvinyl alcohol     Physical Exam: General: alert, laying in bed, sleeping comfortably though easily awakens HENT: dry mucous membranes Cardiovascular: RRR Respiratory: no increase work of breathing noted, not in respiratory distress Abdomen: Tender to slight palpation Extremities: no edema in LE b/l Skin: no rashes or lesions on visible skin, pale Neuro: Sleeping though easily able to awaken Psych: Anxious at times when awake  Vital Signs: BP (!) 143/82 (BP Location: Left Arm)   Pulse 97   Temp 97.7 F (36.5 C) (Oral)   Resp 16   Ht '5\' 10"'$  (1.778 m)   Wt 63 kg   SpO2 98%   BMI 19.93 kg/m  SpO2: SpO2: 98 % O2 Device: O2 Device: Room Air O2 Flow Rate:     Intake/output summary:  Intake/Output Summary (Last 24 hours) at 06/24/2022 1139 Last data filed at 06/24/2022 0855 Gross per 24 hour  Intake 240 ml  Output --  Net 240 ml   LBM: Last BM Date :  (last week per report.) Baseline Weight: Weight: 63.2 kg Most recent weight: Weight: 63 kg  Palliative Care Assessment & Plan   Assessment: Mr. Hector Lopez is a 76 y.o. male  with past medical history of stage IV high-grade urothelial cancer diagnosed in 2021, HTN, T2DM, GERD, and HLD who was admitted on 06/13/2022 for management of abdominal pain, generalized weakness, and fatigue. Palliative care consulted to assist with complex medical decision making and symptom management.   Recommendations/Plan: # Complex medical decision making/ GOC:       -Patient's medical status has continued to deteriorate.  Patient no longer appropriate for home hospice and now planning for residential hospice when bed available.  Wife today confirms this is plan.  -Patient's goals for medical care are comfort focused.  -Patient is DNR/DNI  # Symptom Management   - Pain, severe acute on chronic pain in the setting on stage IV urothelial cancer with goal to focus on comfort  At time of EMR review patient had received 50 mcg fentanyl patch in addition to 6.5 mg of IV Dilaudid.  Based on approximate conversions with incomplete cross tolerance, this is approximately 200 OME.  We will transition to IV PCA with continuous dosing at this time as patient receives good symptom management from IV Dilaudid and to allow for more frequent dosing.   -Discontinue fentanyl patch at this time.   -Start IV Dilaudid PCA with a continuous dose of 0.5 mg/h.  Allow for bolus PCA dosing at 1 mg every hour as needed.  As needed dosing may need to be further adjusted based on symptom burden.   -Discontinued prn opioids not given through PCA.   -Anxiety/agitation, in setting of metastatic cancer with goal to focus on comfort  Patient  having worsening  agitation in the setting of end-of-life care.  -Scheduled Ativan 0.5 mg IV twice daily (morning and evening) when anxiety appears to be worse.  -Continue Ativan 0.5 mg IV every 4 hours as needed for breakthrough anxiety and agitation.  - Constipation  -Continue to monitor bowel movements on comfort care.  Ordered bisacodyl suppository daily as needed for management.   Prognosis: Days to short weeks (<2) with increased lethargy, decreased oral intake, decreased urination  Discharge Planning: Awaiting placement with residential hospice when bed available.  Chelsea Aus, DO Palliative Care Provider [phone]  If patient remains symptomatic despite maximum doses, please call PMT at [phone] between 0700 and 1900. Outside of these hours, please call attending, as PMT does not have night coverage.

## 2022-06-25 DIAGNOSIS — R109 Unspecified abdominal pain: Secondary | ICD-10-CM | POA: Diagnosis not present

## 2022-06-25 DIAGNOSIS — K668 Other specified disorders of peritoneum: Secondary | ICD-10-CM | POA: Diagnosis not present

## 2022-06-25 DIAGNOSIS — K5900 Constipation, unspecified: Secondary | ICD-10-CM | POA: Diagnosis not present

## 2022-06-25 DIAGNOSIS — R531 Weakness: Secondary | ICD-10-CM | POA: Diagnosis not present

## 2022-06-25 NOTE — Care Management Important Message (Signed)
Important Message  Patient Details IM Letter given Name: Hector Lopez MRN: [id number] Date of Birth: [birth year]   Medicare Important Message Given:  No     Kerin Salen 06/25/2022, 10:15 AM

## 2022-06-25 NOTE — Progress Notes (Signed)
PROGRESS NOTE    Hector Lopez  IRW:[id number] DOB: 1946-03-19 DOA: 06/13/2022 PCP: [name], MD    Chief Complaint  Patient presents with   Failure To Thrive    Brief Narrative:  Patient 76 year old gentleman history of laparoscopic cholecystectomy, stage IV urothelial cancer, GERD, diabetes mellitus type 2 presented to the ED with abdominal pain, failure to thrive.  CT showing possible fluid collection in the gallbladder fossa.  CT also consistent with enlarging necrotic soft tissue mass anterior to the abdominal aorta concerning for underlying neoplastic process, large amount of impacted stool within the distal sigmoid colon and rectum.  Patient placed on IV pain medication, IV fluids, electrolyte repletion.  ED contacted patient's primary oncologist Dr. Alen Blew who recommended admission for further inpatient management due to p.o. intolerance and anorexia.  General surgery consulted.   Assessment & Plan:   Principal Problem:   Generalized weakness Active Problems:   Essential hypertension   Abdominal pain   Mixed hyperlipidemia   Failure to thrive in adult   Loss of weight   Anorexia   Hypoalbuminemia due to protein-calorie malnutrition (HCC)   Hypokalemia   Dehydration   Palliative care encounter   ACP (advance care planning)   Urothelial cancer ([place])   Metastatic malignant neoplasm (HCC)   Intractable pain   Appetite loss   Goals of care, counseling/discussion   Constipation   Biloma   Hypomagnesemia   Counseling and coordination of care   Need for emotional support   High risk medication use  #1 generalized weakness/failure to thrive in adult/hypoalbuminemia/moderate protein calorie malnutrition -Likely secondary to stage IV urothelial cancer. -Patient also noted to be constipated has not had a bowel movement in 5 weeks. -Patient noted with 30 pound weight loss over the past 3 months with poor appetite due to food not tasting well. -Diet liberalized to a  regular diet/comfort feeds. -Focus has been shifted to comfort measures, awaiting residential hospice home. -Palliative care consulted and following, for pain control, symptom management.  2.  Abdominal pain/abdominal mass -CT abdomen and pelvis with enlarging 5.4 x 4.7 x 8 cm necrotic soft tissue mass anterior to the abdominal aorta concerning for presence of underlying neoplastic process. -Patient also constipated, stated has not had a bowel movement in 5 weeks, CT abdomen and pelvis done with large amount of impacted stool within the distal sigmoid colon and rectum. - SMOG enema x2 given with good results.  -Was noted per admitting physician to ED already consulted Dr. Alen Blew. -Hematology/oncology following. -Palliative care following for symptom management and pain medications being adjusted by palliative care. -Patient currently on a Dilaudid PCA pump was on IV scheduled Ativan which has been changed to as needed. -Focus now comfort measures. -Awaiting residential hospice home.  3.  Constipation -Patient states no bowel movement in 5 weeks prior to admission. -CT abdomen and pelvis with large amount of impacted stool within the distal sigmoid colon and rectum. -Smog enema x2 given with good results.   -Continue current bowel regimen with MiraLAX twice daily, Senokot-S twice daily.    4.  Stage IV urothelial cancer -Oncology informed of admission. -Palliative care consulted and following and decision made to likely discharge patient home with hospice following. -TOC consulted to set up home hospice. -Pain currently being managed by palliative care. -Focus has been shifted to comfort measures. -Awaiting residential hospice home. -Palliative care following and appreciate input and recommendations.  5. ?  Large postop fluid collection versus biloma -Noted on CT  abdomen and pelvis. -Patient seen by general surgery who doubt patient has a biloma, HIDA scan ordered and done and negative  for biloma.  -Patient started on a diet per general surgery. -General surgery was following but have signed off. -Patient now transitioned to full comfort measures.  6.  Dehydration -IV fluids saline locked.   7.  Hyperlipidemia -Transition to comfort measures.   -Statin discontinued.  8.  Type 2 diabetes mellitus with uncontrolled hyperglycemia -Hemoglobin A1c 10.8 (03/29/2022) -Patient transition to comfort care, long-acting insulin and SSI discontinued.   9.  Hypokalemia/hypomagnesemia -Repleted.  10.  GERD PPI.  Comfort care.  11.  Hypertension -Patient now comfort care.  Antihypertensive medications discontinued.      DVT prophylaxis: Comfort care Code Status: Full Family Communication: No family at bedside. Disposition: Residential hospice home.   Status is: Inpatient Remains inpatient appropriate because: Severity of illness   Consultants:  General surgery: Dr. Brantley Stage 06/13/2022 Palliative care: Dr. Vinetta Bergamo 06/14/2022  Procedures:  CT abdomen and pelvis 06/13/2022 HIDA scan 06/16/2022  Antimicrobials:  None   Subjective: Sleeping deeply.  On Dilaudid PCA pump.  Opens eyes briefly and drifts back off to sleep.    Objective: Vitals:   06/23/22 1331 06/24/22 0458 06/24/22 1329 06/25/22 0636  BP: 130/78 (!) 143/82 132/79 (!) 124/99  Pulse: (!) 101 97 94 95  Resp: '18 16 14 18  '$ Temp: 99 F (37.2 C) 97.7 F (36.5 C) 99 F (37.2 C) 98 F (36.7 C)  TempSrc: Oral Oral Oral Oral  SpO2: 96% 98% 98% 96%  Weight:      Height:        Intake/Output Summary (Last 24 hours) at 06/25/2022 1157 Last data filed at 06/24/2022 1820 Gross per 24 hour  Intake 0 ml  Output --  Net 0 ml    Filed Weights   06/13/22 1638 06/14/22 0133  Weight: 63.2 kg 63 kg    Examination:  General exam: NAD. Respiratory system: CTAB anterior lung fields.  No wheezes, no crackles, no rhonchi.  Fair air movement.   Cardiovascular system: RRR no murmurs rubs or gallops.  No JVD.   No lower extremity edema.  Gastrointestinal system: Abdomen is soft, nontender, nondistended, positive bowel sounds.  No rebound.  No guarding.   Central nervous system: Sedated.  Moving extremities spontaneously.  No focal neurological deficits.   Extremities: Symmetric 5 x 5 power. Skin: No rashes, lesions or ulcers Psychiatry: Judgement and insight unable to assess.  Mood and affect unable to assess.     Data Reviewed: I have personally reviewed following labs and imaging studies  CBC: No results for input(s): "WBC", "NEUTROABS", "HGB", "HCT", "MCV", "PLT" in the last 168 hours.   Basic Metabolic Panel: No results for input(s): "NA", "K", "CL", "CO2", "GLUCOSE", "BUN", "CREATININE", "CALCIUM", "MG", "PHOS" in the last 168 hours.   GFR: Estimated Creatinine Clearance: 63.6 mL/min (by C-G formula based on SCr of 0.88 mg/dL).  Liver Function Tests: No results for input(s): "AST", "ALT", "ALKPHOS", "BILITOT", "PROT", "ALBUMIN" in the last 168 hours.   CBG: Recent Labs  Lab 06/19/22 1158 06/19/22 1646 06/19/22 2121 06/20/22 0740 06/20/22 1229  GLUCAP 161* 203* 192* 107* 150*      No results found for this or any previous visit (from the past 240 hour(s)).       Radiology Studies: No results found.      Scheduled Meds:  magic mouthwash  5 mL Oral TID   polyethylene glycol  17  g Oral BID   senna-docusate  2 tablet Oral BID   Continuous Infusions:  HYDROmorphone 0.5 mg/hr (06/24/22 1348)      LOS: 12 days    Time spent: 40 minutes    Irine Seal, MD [facility]   To contact the attending provider between 7A-7P or the covering provider during after hours 7P-7A, please log into the web site [email or web address] and access using universal [place] password for that web site. If you do not have the password, please call the hospital operator.  06/25/2022, 11:57 AM

## 2022-06-25 NOTE — TOC Progression Note (Signed)
Transition of Care [facility]) - Progression Note    Patient Details  Name: Hector Lopez MRN: [id number] Date of Birth: [birth year]  Transition of Care [facility]) CM/SW Contact  Leeroy Cha, RN Phone Number: 06/25/2022, 9:31 AM  Clinical Narrative:    [facility]-inquiry as to bed status. Spoke with Aldona Bar will check beds and call back.   Expected Discharge Plan: Home/Self Care Barriers to Discharge: Continued Medical Work up  Expected Discharge Plan and Services Expected Discharge Plan: Home/Self Care       Living arrangements for the past 2 months: Single Family Home                                       Social Determinants of Health (SDOH) Interventions    Readmission Risk Interventions   Row Labels 06/23/2022   10:01 AM 06/16/2022   10:51 AM 03/31/2022    8:32 AM  Readmission Risk Prevention Plan   Section Header. No data exists in this row.     Transportation Screening    Complete Complete  PCP or Specialist Appt within 3-5 Days     Complete  HRI or [place]     Complete  Social Work Consult for [place] Planning/Counseling     Complete  Palliative Care Screening     Not Applicable  Medication Review Press photographer)    Complete Complete  PCP or Specialist appointment within 3-5 days of discharge    Complete   HRI or [place]    Complete   SW Recovery Care/Counseling Consult    Complete   Palliative Care Screening   Complete Not [place]   Not Applicable Not Applicable

## 2022-06-25 NOTE — Progress Notes (Signed)
Chaplain attempted follow up, but patient was resting and family was not present.  [address], [place] Pager, [phone]

## 2022-06-25 NOTE — Progress Notes (Signed)
Daily Progress Note   Patient Name: Hector Lopez       Date: 06/25/2022 DOB: 1945-11-17  Age: 76 y.o. MRN#: [id number] Attending Physician: Eugenie Filler, MD Primary Care Physician: [name], MD Admit Date: 06/13/2022  Reason for Consultation/Follow-up: Complex medical decision making and symptom management  Subjective:   Followed up this morning regarding patient's pain management.  Patient has been continued on IV Dilaudid PCA with basal dosing of 0.5 mg/hr. Patient received Ativan 0.5 mg x 2 doses to assist with agitation yesterday.  Presented to bedside to see patient this morning.  Patient sleeping comfortably upon arrival to bedside.  No family present at bedside during visit.  Patient awakens and even waves to bedside nurse, who presented as well, to say hello.  Patient not currently having any symptom complaints.  Scheduled morning dose of Ativan was held as patient appeared comfortable this morning with improved pain management.  Discussed discontinuing scheduled Ativan and allowing for as needed Ativan as needed.  Awaiting bed placement at residential hospice.  Length of Stay: 12  Current Medications: Scheduled Meds:   LORazepam  0.5 mg Intravenous BID   magic mouthwash  5 mL Oral TID   polyethylene glycol  17 g Oral BID   senna-docusate  2 tablet Oral BID    PRN Meds: alum & mag hydroxide-simeth, antiseptic oral rinse, bisacodyl, HYDROmorphone, lip balm, [DISCONTINUED] LORazepam **OR** [DISCONTINUED] LORazepam **OR** LORazepam, [DISCONTINUED] ondansetron **OR** ondansetron (ZOFRAN) IV, phenol, polyvinyl alcohol     Physical Exam: General: sleeping comfortably, easily awakened and waving hello HENT: dry mucous membranes Cardiovascular: RRR, strong pulse  palpated in right radial artery Respiratory: no increase work of breathing noted, not in respiratory distress, RR measured at 18 Abdomen: Not tender to palpation Extremities: no edema in LE b/l Skin: Warm to touch in all extremities Neuro: Sleeping though easily able to awaken Psych: Anxiety improved as compared to yesterday, does not appear anxious at this time  Vital Signs: BP (!) 124/99 (BP Location: Left Arm)   Pulse 95   Temp 98 F (36.7 C) (Oral)   Resp 18   Ht '5\' 10"'$  (1.778 m)   Wt 63 kg   SpO2 96%   BMI 19.93 kg/m  SpO2: SpO2: 96 % O2 Device: O2 Device: Room Air O2 Flow Rate:  Intake/output summary:  Intake/Output Summary (Last 24 hours) at 06/25/2022 1029 Last data filed at 06/24/2022 1820 Gross per 24 hour  Intake 0 ml  Output --  Net 0 ml   LBM: Last BM Date : 06/22/22 Baseline Weight: Weight: 63.2 kg Most recent weight: Weight: 63 kg  Palliative Care Assessment & Plan   Assessment: [name] is a 76 y.o. male  with past medical history of stage IV high-grade urothelial cancer diagnosed in 2021, HTN, T2DM, GERD, and HLD who was admitted on 06/13/2022 for management of abdominal pain, generalized weakness, and fatigue. Palliative care consulted to assist with complex medical decision making and symptom management.   Recommendations/Plan: # Complex medical decision making/ GOC:       -Patient's goals for care are comfort focused at this time.  Awaiting placement at residential hospice.  -Patient is DNR/DNI  # Symptom Management   - Pain, severe acute on chronic pain in the setting on stage IV urothelial cancer with goal to focus on comfort  -Continue IV Dilaudid PCA with a continuous dose of 0.5 mg/h.  Continue to allow for bolus PCA dosing at 1 mg every hour as needed.   -Do not restart prn opioids not given through PCA.   -Anxiety/agitation, in setting of metastatic cancer with goal to focus on comfort  Patient having worsening agitation in the  setting of end-of-life care.  -Discontinued scheduled Ativan 0.5 mg IV twice daily as symptoms improved this morning with improved pain management.  -Can continue Ativan 0.5 mg IV every 4 hours as needed for breakthrough anxiety and agitation.  - Constipation  -Continue to monitor bowel movements on comfort care.  Ordered bisacodyl suppository daily as needed for management.   Prognosis: Days to short weeks (<2) with increased lethargy, decreased oral intake, decreased urination  Discharge Planning: Awaiting placement with residential hospice when bed available.  Chelsea Aus, DO Palliative Care Provider [phone]  If patient remains symptomatic despite maximum doses, please call PMT at [phone] between 0700 and 1900. Outside of these hours, please call attending, as PMT does not have night coverage.

## 2022-06-26 DIAGNOSIS — K5900 Constipation, unspecified: Secondary | ICD-10-CM | POA: Diagnosis not present

## 2022-06-26 DIAGNOSIS — K668 Other specified disorders of peritoneum: Secondary | ICD-10-CM | POA: Diagnosis not present

## 2022-06-26 DIAGNOSIS — R109 Unspecified abdominal pain: Secondary | ICD-10-CM | POA: Diagnosis not present

## 2022-06-26 DIAGNOSIS — R531 Weakness: Secondary | ICD-10-CM | POA: Diagnosis not present

## 2022-06-26 MED ORDER — HYDROMORPHONE HCL 4 MG PO TABS: 4.0000 mg | ORAL_TABLET | ORAL | 0 refills | Status: AC | PRN

## 2022-06-26 MED ORDER — PANTOPRAZOLE SODIUM 40 MG PO TBEC: 40.0000 mg | DELAYED_RELEASE_TABLET | Freq: Every day | ORAL | 0 refills | Status: AC

## 2022-06-26 MED ORDER — SENNOSIDES-DOCUSATE SODIUM 8.6-50 MG PO TABS: 2.0000 | ORAL_TABLET | Freq: Two times a day (BID) | ORAL | Status: AC

## 2022-06-26 MED ORDER — POLYETHYLENE GLYCOL 3350 17 G PO PACK: 17.0000 g | PACK | Freq: Two times a day (BID) | ORAL | 0 refills | Status: AC

## 2022-06-26 MED ORDER — LORAZEPAM 0.5 MG PO TABS: 0.5000 mg | ORAL_TABLET | ORAL | 0 refills | Status: AC | PRN

## 2022-06-26 NOTE — TOC Progression Note (Signed)
[facility]) - Progression Note    Patient Details  Name: Fordyce Lepak MRN: [id number] Date of Birth: 08/19/1946  Transition of Care [facility]) CM/SW Contact  Leeroy Cha, RN Phone Number: 06/26/2022, 12:01 PM  Clinical Narrative:    Charmaine Downs at [facility] in [place] is ok to come.  She will arrange transport.  RN Marta Lamas made aware.   Expected Discharge Plan: Home/Self Care Barriers to Discharge: Continued Medical Work up  Expected Discharge Plan and Services Expected Discharge Plan: Home/Self Care       Living arrangements for the past 2 months: Single Family Home Expected Discharge Date: 06/26/22                                     Social Determinants of Health ([place]) Interventions    Readmission Risk Interventions   Row Labels 06/23/2022   10:01 AM 06/16/2022   10:51 AM 03/31/2022    8:32 AM  Readmission Risk Prevention Plan   Section Header. No data exists in this row.     Transportation Screening    Complete Complete  PCP or Specialist Appt within 3-5 Days     Complete  HRI or [place]     Complete  Social Work Consult for [place] Planning/Counseling     Complete  Palliative Care Screening     Not Applicable  Medication Review Press photographer)    Complete Complete  PCP or Specialist appointment within 3-5 days of discharge    Complete   HRI or [place]    Complete   SW Recovery Care/Counseling Consult    Complete   Palliative Care Screening   Complete Not [place]   Not Applicable Not Applicable

## 2022-06-26 NOTE — TOC Transition Note (Signed)
Transition of Care [facility]) - CM/SW Discharge Note   Patient Details  Name: Hector Lopez MRN: [id number] Date of Birth: December 02, 1945  Transition of Care [facility]) CM/SW Contact:  Hector Ingles, RN Phone Number:[phone]  06/26/2022, 12:10 PM   Clinical Narrative:    CM has been made aware that  patient has been cleared to discharge to [facility] with [facility] and transportation has already been arranged. Bedside Hector Lopez has been made aware and provided with number for report [phone]. Discharge packet including DNR form is on unit at nurses station. Spouse Hector Lopez has been made aware of discharge.  No other needs noted at this time. TOC will sign off.      Barriers to Discharge: Continued Medical Work up   Patient Goals and CMS Choice Patient states their goals for this hospitalization and ongoing recovery are:: to return home CMS Medicare.gov Compare Post Acute Care list provided to:: Patient    Discharge Placement                       Discharge Plan and Services                                     Social Determinants of Health (SDOH) Interventions     Readmission Risk Interventions    06/23/2022   10:01 AM 06/16/2022   10:51 AM 03/31/2022    8:32 AM  Readmission Risk Prevention Plan  Transportation Screening  Complete Complete  PCP or Specialist Appt within 3-5 Days   Complete  HRI or [place]   Complete  Social Work Consult for [place] Planning/Counseling   Complete  Palliative Care Screening   Not Applicable  Medication Review Press photographer)  Complete Complete  PCP or Specialist appointment within 3-5 days of discharge  Complete   HRI or [place]  Complete   SW Recovery Care/Counseling Consult  Complete   Palliative Care Screening Complete Not [place] Not Applicable Not Applicable

## 2022-06-26 NOTE — Progress Notes (Addendum)
Cage aide entered in error wrong patient

## 2022-06-26 NOTE — TOC Progression Note (Signed)
Transition of Care [facility]) - Progression Note    Patient Details  Name: Hector Lopez MRN: [id number] Date of Birth: 08-25-1946  Transition of Care [facility]) CM/SW Contact  Leeroy Cha, RN Phone Number: 06/26/2022, 9:39 AM  Clinical Narrative:    [id number]/tcf-[place] at [facility].  Pt has a bed at [facility] today.  Pharmacy and director have accepted patient.  Wcb for transport when consent signed by the wife.   Expected Discharge Plan: Home/Self Care Barriers to Discharge: Continued Medical Work up  Expected Discharge Plan and Services Expected Discharge Plan: Home/Self Care       Living arrangements for the past 2 months: Single Family Home                                       Social Determinants of Health (SDOH) Interventions    Readmission Risk Interventions   Row Labels 06/23/2022   10:01 AM 06/16/2022   10:51 AM 03/31/2022    8:32 AM  Readmission Risk Prevention Plan   Section Header. No data exists in this row.     Transportation Screening    Complete Complete  PCP or Specialist Appt within 3-5 Days     Complete  HRI or [place]     Complete  Social Work Consult for [place] Planning/Counseling     Complete  Palliative Care Screening     Not Applicable  Medication Review Press photographer)    Complete Complete  PCP or Specialist appointment within 3-5 days of discharge    Complete   HRI or [place]    Complete   SW Recovery Care/Counseling Consult    Complete   Palliative Care Screening   Complete Not [place]   Not Applicable Not Applicable

## 2022-06-26 NOTE — Progress Notes (Signed)
Patient discharged to [place], via EMS. Report called to Amy, RN. Pt discharged with peripheral IV.

## 2022-06-26 NOTE — Discharge Summary (Signed)
Physician Discharge Summary  Hector Lopez GMW:[id number] DOB: December 01, 1945 DOA: 06/13/2022  PCP: [name], MD  Admit date: 06/13/2022 Discharge date: 06/26/2022  Time spent: 50 minutes  Recommendations for Outpatient Follow-up:  Patient will be discharged to residential hospice home.  Follow-up with MD at residential hospice home.   Discharge Diagnoses:  Principal Problem:   Generalized weakness Active Problems:   Essential hypertension   Abdominal pain   Mixed hyperlipidemia   Failure to thrive in adult   Loss of weight   Anorexia   Hypoalbuminemia due to protein-calorie malnutrition (HCC)   Hypokalemia   Dehydration   Palliative care encounter   ACP (advance care planning)   Urothelial cancer ([place])   Metastatic malignant neoplasm ([place])   Intractable pain   Appetite loss   Goals of care, counseling/discussion   Constipation   Biloma   Hypomagnesemia   Counseling and coordination of care   Need for emotional support   High risk medication use   Discharge Condition: Stable  Diet recommendation: Regular diet/comfort feeds  Filed Weights   06/13/22 1638 06/14/22 0133  Weight: 63.2 kg 63 kg    History of present illness:  HPI per Dr.Adefeso Hector Lopez is a 76 y.o. male with medical history significant of adenopathy s/p laparoscopic assisted nephroureterectomy in 2021, hypertension, T2DM, GERD, hyperlipidemia who presents to the emergency department accompanied by wife and daughter due to abdominal pain, generalized weakness and fatigue.  Patient was admitted from 8/29 through 9/1 due to right upper quadrant of not being secondary to symptomatic cholelithiasis and patient underwent laparoscopic cholecystectomy prior to discharge.  Abdominal pain which is intermittent has since continued since gallbladder removal.  Wife at bedside states that patient has lost up to 30 pounds within the last 3 months.  Patient complained of poor appetite and that food does not taste  well.  He complained of nausea, but denies vomiting, patient also denies fever, chills, chest pain, shortness of breath.  Patient followed up with his PCP who asked him to go to the emergency department for further evaluation and management.   ED Course:  In the emergency department, he was hemodynamically stable.  Work-up in the ED showed mild leukocytosis, normocytic anemia, hypokalemia, hyperglycemia, BUN 35, creatinine 1.16, albumin 2.5, lactic acid was normal, lipase 26, urinalysis was normal, magnesium and phosphorus were within normal limits. CT abdomen pelvis with contrast showed: 1. Evidence of prior cholecystectomy. 2. Additional findings that may represent a large postoperative fluid collection versus biloma extending into the anterolateral aspect of the right lobe of the liver. 3. Enlarging 5.4 cm x 4.7 cm x 8.0 cm necrotic soft tissue mass anterior to the abdominal aorta, concerning for the presence of an underlying neoplastic process. Further evaluation with tissue sampling and nuclear medicine PET/CT is recommended. 4. Colonic diverticulosis. 5. Large amount of impacted stool within the distal sigmoid colon and rectum. 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 8. Aortic atherosclerosis. He was treated with Dilaudid, IV hydration, potassium was replenished.   Dr. Alen Blew was consulted and recommended admitting the patient for further inpatient management of his p.o. intolerance and anorexia per ED physician Dr. Brantley Stage of general surgery was consulted and recommends possible IR drainage of the fluid collection and the patient can be admitted by hospitalist. Hospitalist was asked to admit patient for further evaluation and management.  Hospital Course:  #1 generalized weakness/failure to thrive in adult/hypoalbuminemia/moderate protein calorie malnutrition -Likely secondary to  stage IV urothelial cancer. -Patient also noted to be constipated has  not had a bowel movement in 5 weeks. -Patient noted with 30 pound weight loss over the past 3 months with poor appetite due to food not tasting well. -Diet liberalized to a regular diet/comfort feeds. -Focus shifted to comfort measures, and patient will be transferred to residential hospice home.   -Palliative care following the patient throughout the hospitalization for symptom control, pain management.   -Patient subsequently placed on a Dilaudid PCA pump.   -Patient will be discharged in stable condition.    2.  Abdominal pain/abdominal mass -CT abdomen and pelvis with enlarging 5.4 x 4.7 x 8 cm necrotic soft tissue mass anterior to the abdominal aorta concerning for presence of underlying neoplastic process. -Patient also constipated, stated has not had a bowel movement in 5 weeks, CT abdomen and pelvis done with large amount of impacted stool within the distal sigmoid colon and rectum. - SMOG enema x2 given with good results.  -Was noted per admitting physician that ED already consulted Dr. Alen Blew. -Hematology/oncology following the patient early on in the hospitalization. -Palliative following the patient for symptom management and pain medications were adjusted for better pain control.  -Patient subsequently placed on a Dilaudid PCA pump was on IV scheduled Ativan which has been changed to as needed. -Patient transition to full comfort measures.   -Patient will be discharged to residential hospice home.   3.  Constipation -Patient states no bowel movement in 5 weeks prior to admission. -CT abdomen and pelvis with large amount of impacted stool within the distal sigmoid colon and rectum. -Smog enema x2 given with good results.   -Patient subsequently placed on a bowel regimen of MiraLAX twice daily, Senokot-S twice daily.   -Patient has been transition to full comfort measures and will be discharged to residential hospice home.   4.  Stage IV urothelial cancer -Oncology informed of  admission. -Palliative care consulted and following and decision made to likely discharge patient home with hospice following. -TOC consulted to set up home hospice. -Pain currently being managed by palliative care. -Focus has been shifted to comfort measures. -Patient will be discharged to residential hospice home.   5. ?  Large postop fluid collection versus biloma -Noted on CT abdomen and pelvis. -Patient seen by general surgery who doubt patient has a biloma, HIDA scan ordered and done and negative for biloma.  -Patient started on a diet per general surgery. -General surgery was following but have signed off. -Patient now transitioned to full comfort measures. -Patient to be discharged to residential hospice home.   6.  Dehydration -IV fluids saline locked.    7.  Hyperlipidemia -Transitioned to comfort measures.   -Statin discontinued.   8.  Type 2 diabetes mellitus with uncontrolled hyperglycemia -Hemoglobin A1c 10.8 (03/29/2022) -Patient transitioned to comfort care, long-acting insulin and SSI discontinued.    9.  Hypokalemia/hypomagnesemia -Repleted.   10.  GERD PPI.  Comfort care.   11.  Hypertension -Patient now comfort care.  Antihypertensive medications discontinued.          Procedures: CT abdomen and pelvis 06/13/2022 HIDA scan 06/16/2022  Consultations: General surgery: Dr. Brantley Stage 06/13/2022 Palliative care: Dr. Vinetta Bergamo 06/14/2022  Discharge Exam: Vitals:   06/25/22 0636 06/26/22 0553  BP: (!) 124/99 126/67  Pulse: 95 95  Resp: 18 18  Temp: 98 F (36.7 C) 97.8 F (36.6 C)  SpO2: 96% 96%    General: Comfortable. Cardiovascular: RRR no murmurs rubs  or gallops.  No JVD.  No lower extremity edema. Respiratory: Clear to auscultation bilaterally anterior lung fields.  Discharge Instructions   Discharge Instructions     Diet general   Complete by: As directed    Increase activity slowly   Complete by: As directed       Allergies as of  06/26/2022       Reactions   Codeine Nausea And Vomiting        Medication List     STOP taking these medications    acetaminophen 500 MG tablet Commonly known as: TYLENOL   amLODipine 10 MG tablet Commonly known as: NORVASC   aspirin EC 81 MG tablet   Basaglar KwikPen 100 UNIT/ML   blood glucose meter kit and supplies Kit   gabapentin 400 MG capsule Commonly known as: NEURONTIN   insulin lispro 100 UNIT/ML KwikPen Commonly known as: HumaLOG KwikPen   lisinopril 40 MG tablet Commonly known as: ZESTRIL   megestrol 40 MG/ML suspension Commonly known as: MEGACE   metFORMIN 1000 MG tablet Commonly known as: GLUCOPHAGE   Pen Needles 3/16" 31G X 5 MM Misc   polyethylene glycol powder 17 GM/SCOOP powder Commonly known as: GLYCOLAX/MIRALAX Replaced by: polyethylene glycol 17 g packet   simvastatin 20 MG tablet Commonly known as: ZOCOR   sodium phosphate 7-19 GM/118ML Enem       TAKE these medications    cetirizine 10 MG tablet Commonly known as: ZYRTEC Take 10 mg by mouth daily.   cimetidine 200 MG tablet Commonly known as: TAGAMET Take 200 mg by mouth daily as needed (for indigestion).   finasteride 5 MG tablet Commonly known as: PROSCAR Take 5 mg by mouth daily.   HYDROmorphone 4 MG tablet Commonly known as: Dilaudid Take 1 tablet (4 mg total) by mouth every 4 (four) hours as needed for severe pain.   latanoprost 0.005 % ophthalmic solution Commonly known as: XALATAN Place 1 drop into the left eye at bedtime.   LORazepam 0.5 MG tablet Commonly known as: Ativan Take 1 tablet (0.5 mg total) by mouth every 4 (four) hours as needed for anxiety.   pantoprazole 40 MG tablet Commonly known as: PROTONIX Take 1 tablet (40 mg total) by mouth daily before lunch.   phenylephrine 1 % nasal spray Commonly known as: NEO-SYNEPHRINE Place 1 drop into both nostrils 3 (three) times daily as needed for congestion.   polyethylene glycol 17 g  packet Commonly known as: MIRALAX / GLYCOLAX Take 17 g by mouth 2 (two) times daily. Replaces: polyethylene glycol powder 17 GM/SCOOP powder   prochlorperazine 10 MG tablet Commonly known as: COMPAZINE TAKE ONE TABLET BY MOUTH EVERY 6 HOURS AS NEEDED FOR NAUSEA AND FOR VOMITING What changed: See the new instructions.   senna-docusate 8.6-50 MG tablet Commonly known as: Senokot-S Take 2 tablets by mouth 2 (two) times daily.   sertraline 50 MG tablet Commonly known as: ZOLOFT Take 50 mg by mouth daily at 12 noon.       Allergies  Allergen Reactions   Codeine Nausea And Vomiting    Follow-up Information     Hospice MD Follow up.                   The results of significant diagnostics from this hospitalization (including imaging, microbiology, ancillary and laboratory) are listed below for reference.    Significant Diagnostic Studies: NM HEPATOBILIARY LEAK (POST-SURGICAL)  Result Date: 06/16/2022 CLINICAL DATA:  Postop abdominal pain. Cholecystectomy on 05/14/2022.  Concern for bile leak. EXAM: NUCLEAR MEDICINE HEPATOBILIARY IMAGING TECHNIQUE: Sequential images of the abdomen were obtained out to 60 minutes following intravenous administration of radiopharmaceutical. RADIOPHARMACEUTICALS:  5.3 mCi Tc-65m Choletec IV COMPARISON:  None Available. FINDINGS: Prompt clearance of radiotracer from blood pool and homogeneous uptake in liver. Counts are evident in the common bile duct and small bowel by 15 minutes. Counts continue to empty into the small bowel of course the study. There is no extrabiliary collection of radiotracer to suggest bile leak or biloma. IMPRESSION: No bile leak or biloma. Patent common bile duct. Electronically Signed   By: SSuzy BouchardM.D.   On: 06/16/2022 12:19   CT Abdomen Pelvis W Contrast  Result Date: 06/13/2022 CLINICAL DATA:  Status post gallbladder surgery 3 weeks ago with continued abdominal pain. EXAM: CT ABDOMEN AND PELVIS WITH CONTRAST  TECHNIQUE: Multidetector CT imaging of the abdomen and pelvis was performed using the standard protocol following bolus administration of intravenous contrast. RADIATION DOSE REDUCTION: This exam was performed according to the departmental dose-optimization program which includes automated exposure control, adjustment of the mA and/or kV according to patient size and/or use of iterative reconstruction technique. CONTRAST:  1032mOMNIPAQUE IOHEXOL 300 MG/ML  SOLN COMPARISON:  March 12, 2022 FINDINGS: Lower chest: No acute abnormality. Hepatobiliary: Stable, adjacent 8 mm and 9 mm foci of parenchymal low attenuation are seen within the anterolateral aspect of the right lobe of the liver. A 5.7 cm x 3.5 cm x 2.9 cm well-defined area of low attenuation (approximately 3.58 Hounsfield units) is seen extending from the inferolateral aspect of the gallbladder fossa along the anterolateral aspect of the right lobe of the liver. Multiple surgical clips are seen within the gallbladder fossa. The common bile duct measures 9 mm in diameter (coronal reformatted image 58, CT series 4). A 2.6 mm calcification is adjacent to the distal aspect of the common bile duct (axial CT image 35, CT series 2) and is located within the pancreatic head. 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 Pancreas: There is diffuse atrophy of the pancreatic parenchyma with numerous subcentimeter pancreatic parenchymal calcifications noted Spleen: Normal in size without focal abnormality. Adrenals/Urinary Tract: The right adrenal gland is not clearly identified. The left adrenal gland is unremarkable. 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 The left kidney is normal in size, without renal calculi or hydronephrosis. Subcentimeter cysts are seen within the posterior aspect of the mid left kidney. Bladder is unremarkable. Stomach/Bowel: Stomach is within normal limits. The appendix is not identified. A large amount of stool is seen within the  distal sigmoid colon and rectum. No evidence of bowel wall thickening, distention, or inflammatory changes. Noninflamed diverticula are seen throughout the descending and sigmoid colon. Vascular/Lymphatic: Aortic atherosclerosis. A 5.4 cm x 4.7 cm x 8.0 cm necrotic soft tissue mass is seen anterior to the abdominal aorta. This is at the level of the left kidney and measures 2.3 cm x 2.5 cm on the prior exam. Reproductive: Prostate is unremarkable. Other: No abdominal wall hernia or abnormality. No abdominopelvic ascites. Musculoskeletal: Multilevel degenerative changes seen throughout the lumbar spine. IMPRESSION: 1. Evidence of prior cholecystectomy. 2. Additional findings that may represent a large postoperative fluid collection versus biloma extending into the anterolateral aspect of the right lobe of the liver. 3. Enlarging 5.4 cm x 4.7 cm x 8.0 cm necrotic soft tissue mass anterior to the abdominal aorta, concerning for the presence of an underlying neoplastic process. Further evaluation with  tissue sampling and nuclear medicine PET/CT is recommended. 4. Colonic diverticulosis. 5. Large amount of impacted stool within the distal sigmoid colon and rectum. 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 8. Aortic atherosclerosis. Electronically Signed   By: Virgina Norfolk M.D.   On: 06/13/2022 17:49    Microbiology: No results found for this or any previous visit (from the past 240 hour(s)).   Labs: Basic Metabolic Panel: No results for input(s): "NA", "K", "CL", "CO2", "GLUCOSE", "BUN", "CREATININE", "CALCIUM", "MG", "PHOS" in the last 168 hours. Liver Function Tests: No results for input(s): "AST", "ALT", "ALKPHOS", "BILITOT", "PROT", "ALBUMIN" in the last 168 hours. No results for input(s): "LIPASE", "AMYLASE" in the last 168 hours. No results for input(s): "AMMONIA" in the last 168 hours. CBC: No results for input(s): "WBC", "NEUTROABS", "HGB", "HCT", "MCV",  "PLT" in the last 168 hours. Cardiac Enzymes: No results for input(s): "CKTOTAL", "CKMB", "CKMBINDEX", "TROPONINI" in the last 168 hours. BNP: BNP (last 3 results) No results for input(s): "BNP" in the last 8760 hours.  ProBNP (last 3 results) No results for input(s): "PROBNP" in the last 8760 hours.  CBG: Recent Labs  Lab 06/19/22 1158 06/19/22 1646 06/19/22 2121 06/20/22 0740 06/20/22 1229  GLUCAP 161* 203* 192* 107* 150*       Signed:  Irine Seal MD.  [facility] 06/26/2022, 10:53 AM

## 2022-06-30 ENCOUNTER — Inpatient Hospital Stay: Payer: Medicare Other | Admitting: Oncology

## 2022-07-16 DEATH — deceased
